# Patient Record
Sex: Male | Born: 1945 | Race: Black or African American | Hispanic: No | Marital: Married | Smoking: Former smoker
Health system: Southern US, Community
[De-identification: ages and names within clinical notes are randomized; demographics above are authoritative.]

## PROBLEM LIST (undated history)

## (undated) DIAGNOSIS — M1990 Unspecified osteoarthritis, unspecified site: Secondary | ICD-10-CM

## (undated) DIAGNOSIS — I10 Essential (primary) hypertension: Secondary | ICD-10-CM

## (undated) DIAGNOSIS — K5790 Diverticulosis of intestine, part unspecified, without perforation or abscess without bleeding: Secondary | ICD-10-CM

## (undated) DIAGNOSIS — K259 Gastric ulcer, unspecified as acute or chronic, without hemorrhage or perforation: Secondary | ICD-10-CM

## (undated) DIAGNOSIS — K635 Polyp of colon: Secondary | ICD-10-CM

## (undated) DIAGNOSIS — G4730 Sleep apnea, unspecified: Secondary | ICD-10-CM

## (undated) DIAGNOSIS — G4733 Obstructive sleep apnea (adult) (pediatric): Secondary | ICD-10-CM

## (undated) DIAGNOSIS — C801 Malignant (primary) neoplasm, unspecified: Secondary | ICD-10-CM

## (undated) DIAGNOSIS — K922 Gastrointestinal hemorrhage, unspecified: Secondary | ICD-10-CM

## (undated) DIAGNOSIS — C61 Malignant neoplasm of prostate: Secondary | ICD-10-CM

## (undated) DIAGNOSIS — I2510 Atherosclerotic heart disease of native coronary artery without angina pectoris: Secondary | ICD-10-CM

## (undated) DIAGNOSIS — E785 Hyperlipidemia, unspecified: Secondary | ICD-10-CM

## (undated) HISTORY — DX: Obstructive sleep apnea (adult) (pediatric): G47.33

## (undated) HISTORY — DX: Gastric ulcer, unspecified as acute or chronic, without hemorrhage or perforation: K25.9

## (undated) HISTORY — DX: Atherosclerotic heart disease of native coronary artery without angina pectoris: I25.10

## (undated) HISTORY — DX: Polyp of colon: K63.5

## (undated) HISTORY — DX: Diverticulosis of intestine, part unspecified, without perforation or abscess without bleeding: K57.90

## (undated) HISTORY — DX: Sleep apnea, unspecified: G47.30

## (undated) HISTORY — DX: Gastrointestinal hemorrhage, unspecified: K92.2

## (undated) HISTORY — DX: Essential (primary) hypertension: I10

## (undated) HISTORY — DX: Unspecified osteoarthritis, unspecified site: M19.90

---

## 2000-06-27 ENCOUNTER — Encounter: Payer: Self-pay | Admitting: Family Medicine

## 2000-06-27 ENCOUNTER — Ambulatory Visit (HOSPITAL_COMMUNITY): Admission: RE | Admit: 2000-06-27 | Discharge: 2000-06-27 | Payer: Self-pay | Admitting: Family Medicine

## 2001-07-31 ENCOUNTER — Ambulatory Visit (HOSPITAL_COMMUNITY): Admission: RE | Admit: 2001-07-31 | Discharge: 2001-07-31 | Payer: Self-pay | Admitting: Family Medicine

## 2001-08-25 ENCOUNTER — Ambulatory Visit (HOSPITAL_COMMUNITY): Admission: RE | Admit: 2001-08-25 | Discharge: 2001-08-25 | Payer: Self-pay | Admitting: Family Medicine

## 2001-09-08 ENCOUNTER — Encounter: Payer: Self-pay | Admitting: Family Medicine

## 2001-09-08 ENCOUNTER — Ambulatory Visit (HOSPITAL_COMMUNITY): Admission: RE | Admit: 2001-09-08 | Discharge: 2001-09-08 | Payer: Self-pay | Admitting: Family Medicine

## 2001-09-30 ENCOUNTER — Ambulatory Visit (HOSPITAL_COMMUNITY): Admission: RE | Admit: 2001-09-30 | Discharge: 2001-09-30 | Payer: Self-pay | Admitting: General Surgery

## 2001-09-30 ENCOUNTER — Encounter: Payer: Self-pay | Admitting: Gastroenterology

## 2002-10-18 ENCOUNTER — Encounter: Payer: Self-pay | Admitting: Family Medicine

## 2002-10-19 ENCOUNTER — Inpatient Hospital Stay (HOSPITAL_COMMUNITY): Admission: AD | Admit: 2002-10-19 | Discharge: 2002-10-21 | Payer: Self-pay | Admitting: Family Medicine

## 2004-03-24 ENCOUNTER — Ambulatory Visit (HOSPITAL_COMMUNITY): Admission: RE | Admit: 2004-03-24 | Discharge: 2004-03-24 | Payer: Self-pay | Admitting: Family Medicine

## 2004-04-12 ENCOUNTER — Encounter: Payer: Self-pay | Admitting: Orthopedic Surgery

## 2004-04-12 ENCOUNTER — Ambulatory Visit (HOSPITAL_COMMUNITY): Admission: RE | Admit: 2004-04-12 | Discharge: 2004-04-12 | Payer: Self-pay | Admitting: Family Medicine

## 2004-04-25 ENCOUNTER — Ambulatory Visit: Payer: Self-pay | Admitting: Orthopedic Surgery

## 2006-07-30 ENCOUNTER — Ambulatory Visit (HOSPITAL_COMMUNITY): Admission: RE | Admit: 2006-07-30 | Discharge: 2006-07-30 | Payer: Self-pay | Admitting: Family Medicine

## 2006-08-14 ENCOUNTER — Encounter (HOSPITAL_COMMUNITY): Admission: RE | Admit: 2006-08-14 | Discharge: 2006-09-13 | Payer: Self-pay | Admitting: Family Medicine

## 2006-11-13 ENCOUNTER — Ambulatory Visit (HOSPITAL_COMMUNITY): Admission: RE | Admit: 2006-11-13 | Discharge: 2006-11-13 | Payer: Self-pay | Admitting: Family Medicine

## 2007-04-28 ENCOUNTER — Ambulatory Visit (HOSPITAL_COMMUNITY): Admission: RE | Admit: 2007-04-28 | Discharge: 2007-04-28 | Payer: Self-pay | Admitting: *Deleted

## 2007-05-01 HISTORY — PX: CARDIAC CATHETERIZATION: SHX172

## 2007-05-13 HISTORY — PX: OTHER SURGICAL HISTORY: SHX169

## 2007-05-28 ENCOUNTER — Observation Stay (HOSPITAL_COMMUNITY): Admission: RE | Admit: 2007-05-28 | Discharge: 2007-05-29 | Payer: Self-pay | Admitting: Cardiology

## 2007-05-28 HISTORY — PX: CARDIAC CATHETERIZATION: SHX172

## 2007-06-03 HISTORY — PX: OTHER SURGICAL HISTORY: SHX169

## 2007-07-01 HISTORY — PX: OTHER SURGICAL HISTORY: SHX169

## 2007-07-07 ENCOUNTER — Encounter (HOSPITAL_COMMUNITY): Admission: RE | Admit: 2007-07-07 | Discharge: 2007-08-06 | Payer: Self-pay | Admitting: *Deleted

## 2007-08-07 ENCOUNTER — Encounter (HOSPITAL_COMMUNITY): Admission: RE | Admit: 2007-08-07 | Discharge: 2007-09-06 | Payer: Self-pay | Admitting: *Deleted

## 2007-09-07 ENCOUNTER — Encounter (HOSPITAL_COMMUNITY): Admission: RE | Admit: 2007-09-07 | Discharge: 2007-10-07 | Payer: Self-pay | Admitting: *Deleted

## 2008-02-26 DIAGNOSIS — K259 Gastric ulcer, unspecified as acute or chronic, without hemorrhage or perforation: Secondary | ICD-10-CM

## 2008-02-26 DIAGNOSIS — K922 Gastrointestinal hemorrhage, unspecified: Secondary | ICD-10-CM

## 2008-02-26 HISTORY — DX: Gastrointestinal hemorrhage, unspecified: K92.2

## 2008-02-26 HISTORY — DX: Gastric ulcer, unspecified as acute or chronic, without hemorrhage or perforation: K25.9

## 2008-02-26 HISTORY — PX: OTHER SURGICAL HISTORY: SHX169

## 2008-12-26 HISTORY — PX: NASAL ENDOSCOPY: SHX286

## 2009-01-12 ENCOUNTER — Encounter: Admission: RE | Admit: 2009-01-12 | Discharge: 2009-01-12 | Payer: Self-pay | Admitting: Orthopaedic Surgery

## 2009-01-23 ENCOUNTER — Other Ambulatory Visit: Payer: Self-pay | Admitting: Emergency Medicine

## 2009-01-23 ENCOUNTER — Inpatient Hospital Stay (HOSPITAL_COMMUNITY): Admission: EM | Admit: 2009-01-23 | Discharge: 2009-01-26 | Payer: Self-pay | Admitting: Pulmonary Disease

## 2009-01-23 ENCOUNTER — Ambulatory Visit: Payer: Self-pay | Admitting: Internal Medicine

## 2009-01-24 ENCOUNTER — Ambulatory Visit: Payer: Self-pay | Admitting: Gastroenterology

## 2009-01-24 ENCOUNTER — Encounter (INDEPENDENT_AMBULATORY_CARE_PROVIDER_SITE_OTHER): Payer: Self-pay | Admitting: Internal Medicine

## 2009-01-25 ENCOUNTER — Encounter (INDEPENDENT_AMBULATORY_CARE_PROVIDER_SITE_OTHER): Payer: Self-pay | Admitting: *Deleted

## 2009-01-30 ENCOUNTER — Encounter: Admission: RE | Admit: 2009-01-30 | Discharge: 2009-01-30 | Payer: Self-pay | Admitting: Orthopaedic Surgery

## 2009-02-01 ENCOUNTER — Encounter: Payer: Self-pay | Admitting: Gastroenterology

## 2009-02-08 ENCOUNTER — Encounter (HOSPITAL_COMMUNITY): Admission: RE | Admit: 2009-02-08 | Discharge: 2009-02-24 | Payer: Self-pay | Admitting: Orthopaedic Surgery

## 2009-02-27 ENCOUNTER — Encounter (HOSPITAL_COMMUNITY): Admission: RE | Admit: 2009-02-27 | Discharge: 2009-03-29 | Payer: Self-pay | Admitting: Orthopaedic Surgery

## 2009-03-28 ENCOUNTER — Ambulatory Visit: Payer: Self-pay | Admitting: Gastroenterology

## 2009-03-28 DIAGNOSIS — K253 Acute gastric ulcer without hemorrhage or perforation: Secondary | ICD-10-CM

## 2009-03-29 LAB — CONVERTED CEMR LAB
Basophils Absolute: 0.1 10*3/uL (ref 0.0–0.1)
Eosinophils Absolute: 0.4 10*3/uL (ref 0.0–0.7)
HCT: 40.9 % (ref 39.0–52.0)
Hemoglobin: 13.3 g/dL (ref 13.0–17.0)
Lymphs Abs: 2.2 10*3/uL (ref 0.7–4.0)
MCHC: 32.5 g/dL (ref 30.0–36.0)
MCV: 90.6 fL (ref 78.0–100.0)
Monocytes Absolute: 1.1 10*3/uL — ABNORMAL HIGH (ref 0.1–1.0)
Monocytes Relative: 10.2 % (ref 3.0–12.0)
Neutro Abs: 6.5 10*3/uL (ref 1.4–7.7)
Platelets: 234 10*3/uL (ref 150.0–400.0)
RDW: 12.8 % (ref 11.5–14.6)

## 2009-03-30 ENCOUNTER — Encounter (HOSPITAL_COMMUNITY): Admission: RE | Admit: 2009-03-30 | Discharge: 2009-04-29 | Payer: Self-pay | Admitting: Orthopaedic Surgery

## 2009-03-31 ENCOUNTER — Ambulatory Visit: Payer: Self-pay | Admitting: Gastroenterology

## 2009-04-26 ENCOUNTER — Encounter: Payer: Self-pay | Admitting: Gastroenterology

## 2009-04-26 ENCOUNTER — Telehealth (INDEPENDENT_AMBULATORY_CARE_PROVIDER_SITE_OTHER): Payer: Self-pay | Admitting: *Deleted

## 2009-04-27 ENCOUNTER — Telehealth: Payer: Self-pay | Admitting: Gastroenterology

## 2009-10-27 ENCOUNTER — Ambulatory Visit: Payer: Self-pay | Admitting: Gastroenterology

## 2009-12-04 ENCOUNTER — Encounter: Payer: Self-pay | Admitting: Gastroenterology

## 2009-12-08 ENCOUNTER — Encounter (INDEPENDENT_AMBULATORY_CARE_PROVIDER_SITE_OTHER): Payer: Self-pay | Admitting: *Deleted

## 2009-12-08 ENCOUNTER — Ambulatory Visit: Payer: Self-pay | Admitting: Gastroenterology

## 2009-12-08 DIAGNOSIS — K625 Hemorrhage of anus and rectum: Secondary | ICD-10-CM

## 2010-01-23 ENCOUNTER — Ambulatory Visit: Payer: Self-pay | Admitting: Gastroenterology

## 2010-01-23 DIAGNOSIS — K635 Polyp of colon: Secondary | ICD-10-CM

## 2010-01-23 HISTORY — DX: Polyp of colon: K63.5

## 2010-01-26 ENCOUNTER — Encounter: Payer: Self-pay | Admitting: Gastroenterology

## 2010-02-25 HISTORY — PX: OTHER SURGICAL HISTORY: SHX169

## 2010-03-13 HISTORY — PX: TRANSTHORACIC ECHOCARDIOGRAM: SHX275

## 2010-03-22 ENCOUNTER — Telehealth: Payer: Self-pay | Admitting: Gastroenterology

## 2010-03-27 NOTE — Letter (Signed)
Summary: Diabetic Instructions  [facility]  [address]   , [place] [address]   Phone: [phone]  Fax: [phone]    Jordan [name] 11/07/1945 MRN: [id number]   X   ORAL DIABETIC MEDICATION INSTRUCTIONS  The day before your procedure:   Take your diabetic pill as you do normally  The day of your procedure:   Do not take your diabetic pill    We will check your blood sugar levels during the admission process and again in Recovery before discharging you home  ________________________________________________________________________  

## 2010-03-27 NOTE — Medication Information (Signed)
Summary: Approved Pantoprazole / [facility]  Approved Pantoprazole / [facility]   Imported By: Lennie Odor 05/04/2009 11:56:35  _____________________________________________________________________  External Attachment:    Type:   Image     Comment:   External Document

## 2010-03-27 NOTE — Procedures (Signed)
Summary: Colonoscopy  Patient: [name] Note: All result statuses are Final unless otherwise noted.  Tests: (1) Colonoscopy (COL)   COL Colonoscopy           DONE     [facility]     [address]     [place], [place]  [address]           COLONOSCOPY PROCEDURE REPORT     PATIENT:  Jordan [name], Jordan [name]  MR#:  [id number]     BIRTHDATE:  1945-08-16, 64 yrs. old  GENDER:  male     ENDOSCOPIST:  Rachael Fee, MD     REF. BY:  [name], M.D.     PROCEDURE DATE:  01/23/2010     PROCEDURE:  Colonoscopy with snare polypectomy     ASA CLASS:  Class II     INDICATIONS:  minor rectal bleeding     MEDICATIONS:   Fentanyl 25 mcg IV, Versed 5 mg IV     DESCRIPTION OF PROCEDURE:   After the risks benefits and     alternatives of the procedure were thoroughly explained, informed     consent was obtained.  Digital rectal exam was performed and     revealed no rectal masses.   The LB [id number]-[id number] endoscope     was introduced through the anus and advanced to the cecum, which     was identified by both the appendix and ileocecal valve, without     limitations.  The quality of the prep was good, using MoviPrep.     The instrument was then slowly withdrawn as the colon was fully     examined.     <<PROCEDUREIMAGES>>     FINDINGS:  A diminutive polyp was found in the descending colon.     This was 3mm across, removed with cold snare and sent to pathology     (jar 1) (see image4).  Mild diverticulosis was found in the     sigmoid to descending colon segments (see image1).  Internal     hemorrhoids were found. These were small, not thrombosed.  This     was otherwise a normal examination of the colon (see image2,     image3, and image5).   Retroflexed views in the rectum revealed no     abnormalities.    The scope was then withdrawn from the patient     and the procedure completed.     COMPLICATIONS:  None     ENDOSCOPIC IMPRESSION:     1) Diminutive polyp in the descending colon,  removed and sent to     pathology     2) Mild diverticulosis in the sigmoid to descending colon     segments     3) Small internal hemorrhoids     4) Otherwise normal examination           RECOMMENDATIONS:     1) If the polyp(s) removed today are proven to be adenomatous     (pre-cancerous) polyps, you will need a repeat colonoscopy in 5     years. Otherwise you should continue to follow colorectal cancer     screening guidelines for "routine risk" patients with colonoscopy     in 10 years.     2) You will receive a letter within 1-2 weeks with the results     of your biopsy as well as final recommendations. Please call my     office if you have not received  a letter after 3 weeks.           ______________________________     Rachael Fee, MD           n.     eSIGNED:   Rachael Fee at 01/23/2010 10:58 AM           Bertis Ruddy, [id number]  Note: An exclamation mark (!) indicates a result that was not dispersed into the flowsheet. Document Creation Date: 01/23/2010 10:58 AM _______________________________________________________________________  (1) Order result status: Final Collection or observation date-time: 01/23/2010 10:50 Requested date-time:  Receipt date-time:  Reported date-time:  Referring Physician:   Ordering Physician: Rob Bunting [phone]) Specimen Source:  Source: Launa Grill Order Number: [id number] Lab site:   Appended Document: Colonoscopy     Procedures Next Due Date:    Colonoscopy: 12/2019

## 2010-03-27 NOTE — Letter (Signed)
Summary: Anticoagulation Modification Letter  [facility]  [address] [place], [place] [address]   Phone: [phone]  Fax: [phone]    December 08, 2009  Re:    ADELBERT GASPARD DOB:    23-Apr-1945 MRN:    [id number]    Dear Dr Daphene Jaeger,  We have scheduled the above patient for an endoscopic procedure. Our records show that  he/she is on anticoagulation therapy. Please advise as to how long the patient may come off their therapy of plavix prior to the scheduled procedure.   Please fax back/or route the completed form to Patty at [phone].  Thank you for your help with this matter.  Sincerely,  Chales Abrahams CMA Duncan Dull)   Physician Recommendation:  Hold Plavix 7 days prior ________________  Hold Coumadin 5 days prior ____________  Other ______________________________     Appended Document: Anticoagulation Modification Letter fax number in the system for Dr Tresa Endo is out of order will fax to [phone]  Appended Document: Anticoagulation Modification Letter response recieved ok to hold pt notified letter to be scanned into EMR

## 2010-03-27 NOTE — Procedures (Signed)
Summary: Colonoscopy/[facility]  Colonoscopy/[facility]   Imported By: Sherian Rein 12/20/2009 09:24:36  _____________________________________________________________________  External Attachment:    Type:   Image     Comment:   External Document

## 2010-03-27 NOTE — Procedures (Signed)
Summary: Upper Endoscopy  Patient: [name] Note: All result statuses are Final unless otherwise noted.  Tests: (1) Upper Endoscopy (EGD)   EGD Upper Endoscopy       DONE     [facility]     [address]     [place], [place]  [address]           ENDOSCOPY PROCEDURE REPORT           PATIENT:  Jordan [name], Jordan [name]  MR#:  [id number]     BIRTHDATE:  06-02-1945, 63 yrs. old  GENDER:  male     ENDOSCOPIST:  Rachael Fee, MD     PROCEDURE DATE:  03/31/2009     PROCEDURE:  EGD, diagnostic     ASA CLASS:  Class II     INDICATIONS:  gastric ulcer 11/10 (probably NSAID related, was H.     pylori negative)     MEDICATIONS:   Fentanyl 50 mcg IV, Versed 5 mg IV     TOPICAL ANESTHETIC:  Exactacain Spray           DESCRIPTION OF PROCEDURE:   After the risks benefits and     alternatives of the procedure were thoroughly explained, informed     consent was obtained.  The LB GIF-H180 [id number] endoscope was     introduced through the mouth and advanced to the second portion of     the duodenum, without limitations.  The instrument was slowly     withdrawn as the mucosa was fully examined.     <<PROCEDUREIMAGES>>           The upper, middle, and distal third of the esophagus were     carefully inspected and no abnormalities were noted. The z-line     was well seen at the GEJ. The endoscope was pushed into the fundus     which was normal including a retroflexed view. The antrum,gastric     body, first and second part of the duodenum were unremarkable (see     image1, image3, image4, image5, image6, and image7).     Retroflexed views revealed no abnormalities.    The scope was then     withdrawn from the patient and the procedure completed.           COMPLICATIONS:  None           ENDOSCOPIC IMPRESSION:     1) Normal EGD; the previously noted gastric ulcer has healed           RECOMMENDATIONS:     Follow up with Dr. Christella Hartigan as needed.     Should continue to avoid NSAIDs however it is OK  to take aspirin     for cardiac reasons as long as you stay on once daily PPI such as     protonix.           ______________________________     Rachael Fee, MD           cc: [name], MD           n.     eSIGNED:   Rachael Fee at 03/31/2009 08:40 AM           Bertis Ruddy, [id number]  Note: An exclamation mark (!) indicates a result that was not dispersed into the flowsheet. Document Creation Date: 03/31/2009 8:40 AM _______________________________________________________________________  (1) Order result status: Final Collection or observation date-time: 03/31/2009 08:34 Requested date-time:  Receipt date-time:  Reported date-time:  Referring Physician:   Ordering Physician: Rob Bunting [phone]) Specimen Source:  Source: Launa Grill Order Number: [id number] Lab site:

## 2010-03-27 NOTE — Progress Notes (Signed)
Summary: prior auth   Phone Note From Pharmacy   Summary of Call: prior auth needed for pantoprazole.  form faxed to [facility]. Initial call taken by: Chales Abrahams CMA (AAMA),  April 26, 2009 9:30 AM

## 2010-03-27 NOTE — Letter (Signed)
Summary: Hold on Medication For Procedure / [facility]  Hold on Medication For Procedure / [facility]   Imported By: Lennie Odor 12/20/2009 11:01:33  _____________________________________________________________________  External Attachment:    Type:   Image     Comment:   External Document

## 2010-03-27 NOTE — Assessment & Plan Note (Signed)
Review of gastrointestinal problems: 1. Gastric ulcer, November 2010. EGD. Likely NSAID related.  H. pylori biopsies negative. Repeat EGD 3 months later was normal    History of Present Illness Visit Type: Follow-up Visit Primary GI MD: Rob Bunting MD Primary Provider: [name] MD Chief Complaint: Consult Colonoscopy due to Plavix History of Present Illness:     very pleasant 65 year old man whom I last saw about 8 months ago at the time of the EGD, see those results summarized above.  who had a colonoscopy about 8 years ago, his PCP sent him for a colonoscopy now. The colonoscopy 8 years ago was normal, no polyps.  Was told that 10 years was next time for colonoscopy.    He has had no bowel troubles.  He will see a small amount of red blood on TP occasionally.  He is on plavix, since stent placed 05/2007.  Dr. Daphene Jaeger is his cardiologist.             Current Medications (verified): 1)  Glucosamine-Chondroitin  Caps (Glucosamine-Chondroit-Vit C-Mn) .Marland Kitchen.. 1 By Mouth Once Daily 2)  Fish Oil 1000 Mg Caps (Omega-3 Fatty Acids) .Marland Kitchen.. 1 By Mouth Two Times A Day 3)  Garlic Oil 1000 Mg Caps (Garlic) .Marland Kitchen.. 1 By Mouth Once Daily 4)  Lisinopril 5 Mg Tabs (Lisinopril) .Marland Kitchen.. 1 By Mouth Once Daily 5)  Metformin Hcl 500 Mg Xr24h-Tab (Metformin Hcl) .Marland Kitchen.. 1 Po    Once Daily 6)  Plavix 75 Mg Tabs (Clopidogrel Bisulfate) .Marland Kitchen.. 1 By Mouth Once Daily 7)  Simvastatin 40 Mg Tabs (Simvastatin) .Marland Kitchen.. 1 By Mouth Once Daily 8)  Cinnamon 500 Mg Caps (Cinnamon) .... Two Times A Day 9)  Carvedilol 3.125 Mg Tabs (Carvedilol) .... Two Times A Day 10)  Beta Prostate Supplement .... Two Times A Day 11)  Aspir-Low 81 Mg Tbec (Aspirin) .... Once Daily 12)  Pantoprazole Sodium 40 Mg Tbec (Pantoprazole Sodium) .... Once Daily 13)  Ferrous Sulfate 325 (65 Fe) Mg Tabs (Ferrous Sulfate) .... Once Daily 14)  Tylenol 8 Hour 650 Mg Cr-Tabs (Acetaminophen) .... As Needed 15)  Oral Chelation .... As  Needed  Allergies (verified): 1)  ! Codeine  Vital Signs:  Patient profile:   65 year old male Height:      67 inches Weight:      298.0 pounds BMI:     46.84 Pulse rate:   52 / minute Pulse rhythm:   regular BP sitting:   110 / 60  (left arm) Cuff size:   regular  Vitals Entered By: Harlow Mares CMA Duncan Dull) (December 08, 2009 2:12 PM)  Physical Exam  Additional Exam:  Constitutional: Morbidly obese, otherwise generally well appearing Psychiatric: alert and oriented times 3 Abdomen: soft, non-tender, non-distended, normal bowel sounds    Impression & Recommendations:  Problem # 1:  intermittent rectal bleeding, this sounds hemorrhoidal, just a small bit of blood on the tissue paper periodically. He had a colonoscopy 8 years ago and we will get those results faxed here for review. Given his intermittent bleeding I think it is reasonable that we repeat colonoscopy at his soonest convenience. He is on Plavix for a stent placed in 2009. He is not sure if this is a drug-eluting stent. We will contact his cardiologist and see if it is okay that he called the Plavix for 5 days prior to a colonoscopy. Being on Plavix puts him at increased risk for bleeding complications.  Patient Instructions: 1)  You will be scheduled  to have a colonoscopy. 2)  We will contact Dr. Daphene Jaeger (cardiology) about you holding the plavix prior to the procedure. 3)  We will get records from [facility] colonoscopy from 2003. 4)  The medication list was reviewed and reconciled.  All changed / newly prescribed medications were explained.  A complete medication list was provided to the patient / caregiver.  Appended Document: Orders Update/Movi    Clinical Lists Changes  Problems: Added new problem of HEMORRHAGE OF RECTUM AND ANUS (ICD-569.3) Medications: Added new medication of MOVIPREP 100 GM  SOLR (PEG-KCL-NACL-NASULF-NA ASC-C) As per prep instructions. - Signed Rx of MOVIPREP 100 GM  SOLR  (PEG-KCL-NACL-NASULF-NA ASC-C) As per prep instructions.;  #1 x 0;  Signed;  Entered by: Chales Abrahams CMA (AAMA);  Authorized by: Rachael Fee MD;  Method used: Electronically to [facility]*, [address] [place], [place], [place]  [address], Ph: [phone], Fax: [phone] Orders: Added new Test order of Colonoscopy (Colon) - Signed    Prescriptions: MOVIPREP 100 GM  SOLR (PEG-KCL-NACL-NASULF-NA ASC-C) As per prep instructions.  #1 x 0   Entered by:   Chales Abrahams CMA (AAMA)   Authorized by:   Rachael Fee MD   Signed by:   Chales Abrahams CMA (AAMA) on 12/08/2009   Method used:   Electronically to        [facility]* (retail)       [address]       [place], [place]  [address]       Ph: [phone]       Fax: [phone]   RxID:   [id number]

## 2010-03-27 NOTE — Letter (Signed)
Summary: Results Letter  [facility]  [address] [place], [place] [address]   Phone: [phone]  Fax: [phone]        January 26, 2010 MRN: [id number]    Jordan [name] [address] [place], [place]  [address]    Dear Mr. GORUM,   Good news.  The polyp(s) that were removed during your recent procedure were NOT pre-cancerous.  You should continue to follow current colorectal cancer screening guidelines with a repeat colonoscopy in 10 years.  We will therefore put your information in our reminder system and will contact you in 10 years to schedule a repeat procedure.  Please call if you have any questions or concerns.       Sincerely,  Rachael Fee MD  This letter has been electronically signed by your physician.  Appended Document: Results Letter Letter mailed

## 2010-03-27 NOTE — Letter (Signed)
Summary: [facility] Instructions  [facility]  [address] [place], [place] [address]   Phone: [phone]  Fax: [phone]       Jordan [name]    [birth year]    MRN: [id number]        Procedure Day /Date:01/23/10 TUE     Arrival Time:9 am     Procedure Time:10 am     Location of Procedure:                    X  [facility] (4th Floor)                        PREPARATION FOR COLONOSCOPY WITH MOVIPREP   Starting 5 days prior to your procedure 01/18/10 do not eat nuts, seeds, popcorn, corn, beans, peas,  salads, or any raw vegetables.  Do not take any fiber supplements (e.g. Metamucil, Citrucel, and Benefiber).  THE DAY BEFORE YOUR PROCEDURE         DATE: 01/22/10  DAY: MON  1.  Drink clear liquids the entire day-NO SOLID FOOD  2.  Do not drink anything colored red or purple.  Avoid juices with pulp.  No orange juice.  3.  Drink at least 64 oz. (8 glasses) of fluid/clear liquids during the day to prevent dehydration and help the prep work efficiently.  CLEAR LIQUIDS INCLUDE: Water Jello Ice Popsicles Tea (sugar ok, no milk/cream) Powdered fruit flavored drinks Coffee (sugar ok, no milk/cream) Gatorade Juice: apple, white grape, white cranberry  Lemonade Clear bullion, consomm, broth Carbonated beverages (any kind) Strained chicken noodle soup Hard Candy                             4.  In the morning, mix first dose of MoviPrep solution:    Empty 1 Pouch A and 1 Pouch B into the disposable container    Add lukewarm drinking water to the top line of the container. Mix to dissolve    Refrigerate (mixed solution should be used within 24 hrs)  5.  Begin drinking the prep at 5:00 p.m. The MoviPrep container is divided by 4 marks.   Every 15 minutes drink the solution down to the next mark (approximately 8 oz) until the full liter is complete.   6.  Follow completed prep with 16 oz of clear liquid of your choice (Nothing red or purple).   Continue to drink clear liquids until bedtime.  7.  Before going to bed, mix second dose of MoviPrep solution:    Empty 1 Pouch A and 1 Pouch B into the disposable container    Add lukewarm drinking water to the top line of the container. Mix to dissolve    Refrigerate  THE DAY OF YOUR PROCEDURE      DATE: 01/23/10 DAY: TUE  Beginning at 5 a.m. (5 hours before procedure):         1. Every 15 minutes, drink the solution down to the next mark (approx 8 oz) until the full liter is complete.  2. Follow completed prep with 16 oz. of clear liquid of your choice.    3. You may drink clear liquids until 8 am (2 HOURS BEFORE PROCEDURE).   MEDICATION INSTRUCTIONS  Unless otherwise instructed, you should take regular prescription medications with a small sip of water   as early as possible the morning of your procedure.  Diabetic  patients - see separate instructions.  Stop taking Plavix  01/16/10  (7 days before procedure).     You will be contacted by our office prior to your procedure for directions on holding your Plavix.  If you do not hear from our office 1 week prior to your scheduled procedure, please call [phone] to discuss.            OTHER INSTRUCTIONS  You will need a responsible adult at least 67 years of age to accompany you and drive you home.   This person must remain in the waiting room during your procedure.  Wear loose fitting clothing that is easily removed.  Leave jewelry and other valuables at home.  However, you may wish to bring a book to read or  an iPod/MP3 player to listen to music as you wait for your procedure to start.  Remove all body piercing jewelry and leave at home.  Total time from sign-in until discharge is approximately 2-3 hours.  You should go home directly after your procedure and rest.  You can resume normal activities the  day after your procedure.  The day of your procedure you should not:   Drive   Make legal  decisions   Operate machinery   Drink alcohol   Return to work  You will receive specific instructions about eating, activities and medications before you leave.    The above instructions have been reviewed and explained to me by   _______________________    I fully understand and can verbalize these instructions _____________________________ Date _________

## 2010-03-27 NOTE — Letter (Signed)
Summary: EGD Instructions  [facility]  [address] [place], [place] [address]   Phone: [phone]  Fax: [phone]       Jordan [name]    July 29, 1945    MRN: [id number]       Procedure Day /Date:03/31/09     Arrival Time: 730 am     Procedure Time:830 am     Location of Procedure:                    X  [facility]oscopy Center (4th Floor)    PREPARATION FOR ENDOSCOPY   On2/4/11THE DAY OF THE PROCEDURE:  1.   No solid foods, milk or milk products are allowed after midnight the night before your procedure.  2.   Do not drink anything colored red or purple.  Avoid juices with pulp.  No orange juice.  3.  You may drink clear liquids until630 am, which is 2 hours before your procedure.                                                                                                CLEAR LIQUIDS INCLUDE: Water Jello Ice Popsicles Tea (sugar ok, no milk/cream) Powdered fruit flavored drinks Coffee (sugar ok, no milk/cream) Gatorade Juice: apple, white grape, white cranberry  Lemonade Clear bullion, consomm, broth Carbonated beverages (any kind) Strained chicken noodle soup Hard Candy   MEDICATION INSTRUCTIONS  Unless otherwise instructed, you should take regular prescription medications with a small sip of water as early as possible the morning of your procedure.  Diabetic patients - see separate instructions.  Stay on Plavix  per Dr Christella Hartigan               OTHER INSTRUCTIONS  You will need a responsible adult at least 67 years of age to accompany you and drive you home.   This person must remain in the waiting room during your procedure.  Wear loose fitting clothing that is easily removed.  Leave jewelry and other valuables at home.  However, you may wish to bring a book to read or an iPod/MP3 player to listen to music as you wait for your procedure to start.  Remove all body piercing jewelry and leave at home.  Total time from sign-in until  discharge is approximately 2-3 hours.  You should go home directly after your procedure and rest.  You can resume normal activities the day after your procedure.  The day of your procedure you should not:   Drive   Make legal decisions   Operate machinery   Drink alcohol   Return to work  You will receive specific instructions about eating, activities and medications before you leave.    The above instructions have been reviewed and explained to me by   _______________________    I fully understand and can verbalize these instructions _____________________________ Date _________

## 2010-03-27 NOTE — Assessment & Plan Note (Signed)
Review of gastrointestinal problems: 1. Acute upper GI bleeding;  November 2010; Hb 7.1; Clean based prepyloric ulcer noted on EGD 12/2008.  Likely NSAID related but biopsies were taken to check for H. pylori. Schatzki's ring, not dilated.  Path showed no H. pylori.   History of Present Illness Visit Type: Follow-up Visit Primary GI MD: Rob Bunting MD Primary Provider: [name] MD Chief Complaint: post hospital f/u History of Present Illness:     very pleasant 65 year old man who is here with his wife today. I last saw him at the time of his hospitalization for acute upper GI bleed in November.  since d/c he has been taking iron daily, metamucil and stool softner. Has completed about 2 months of PPI once daily.  the ringing in his years and fatigue have completely resolved. He takes an aspirin a day for cardioprotective reasons.             Current Medications (verified): 1)  Glucosamine-Chondroitin  Caps (Glucosamine-Chondroit-Vit C-Mn) .Marland Kitchen.. 1 By Mouth Once Daily 2)  Fish Oil 1000 Mg Caps (Omega-3 Fatty Acids) .Marland Kitchen.. 1 By Mouth Two Times A Day 3)  Garlic Oil 1000 Mg Caps (Garlic) .Marland Kitchen.. 1 By Mouth Once Daily 4)  Terazosin Hcl 5 Mg Caps (Terazosin Hcl) .Marland Kitchen.. 1 By Mouth At Bedtime 5)  Hydrocodone-Acetaminophen 10-500 Mg Tabs (Hydrocodone-Acetaminophen) .... Every 4 Hours As Needed 6)  Lisinopril 5 Mg Tabs (Lisinopril) .Marland Kitchen.. 1 By Mouth Once Daily 7)  Metformin Hcl 500 Mg Xr24h-Tab (Metformin Hcl) .Marland Kitchen.. 1 Po    Once Daily 8)  Plavix 75 Mg Tabs (Clopidogrel Bisulfate) .Marland Kitchen.. 1 By Mouth Once Daily 9)  Simvastatin 40 Mg Tabs (Simvastatin) .Marland Kitchen.. 1 By Mouth Once Daily 10)  Cinnamon 500 Mg Caps (Cinnamon) .... Two Times A Day 11)  Carvedilol 3.125 Mg Tabs (Carvedilol) .... Two Times A Day 12)  Beta Prostate Supplement .... Two Times A Day 13)  Aspir-Low 81 Mg Tbec (Aspirin) .... Once Daily 14)  Senna-Docusate Sodium 8.6-50 Mg Tabs (Sennosides-Docusate Sodium) .... Once Daily 15)   Pantoprazole Sodium 40 Mg Tbec (Pantoprazole Sodium) .... Once Daily 16)  Ferrous Sulfate 325 (65 Fe) Mg Tabs (Ferrous Sulfate) .... Once Daily 17)  Tylenol 325 Mg Tabs (Acetaminophen) .... As Needed 18)  Oral Chelation .... As Needed  Allergies (verified): 1)  ! Codeine  Vital Signs:  Patient profile:   65 year old male Height:      67 inches Weight:      289.13 pounds BMI:     45.45 Pulse rate:   64 / minute Pulse rhythm:   regular BP sitting:   112 / 50  (left arm) Cuff size:   regular  Vitals Entered By: June McMurray CMA Duncan Dull) (March 28, 2009 2:52 PM)  Physical Exam  Additional Exam:  Constitutional: obese otherwise generally well appearing Psychiatric: alert and oriented times 3 Abdomen: soft, non-tender, non-distended, normal bowel sounds    Impression & Recommendations:  Problem # 1:  gastric ulcer he has had no signs of recurrent upper GI bleeding. He'll get a repeat CBC today to see if his blood counts have normalized on iron supplements. If they have I will likely stop his iron. He is on an aspirin a day for cardioprotective reasons and for that reason I think he should stay on proton pump inhibitor once daily indefinitely to protect him from having recurrent ulcer. We will set him up for a repeat EGD at his soonest convenience. It is important  to rule out gastric cancer which I suspect is not the case here.  Other Orders: TLB-CBC Platelet - w/Differential ([id number]-[id number])  Patient Instructions: 1)  Prescription for protonix written, take one pill a day as long as you need to take aspirin daily. 2)  You will be scheduled to have an upper endoscopy. 3)  You will get lab test(s) done today (cbc). 4)  A copy of this information will be sent to Dr. Loleta Chance. 5)  The medication list was reviewed and reconciled.  All changed / newly prescribed medications were explained.  A complete medication list was provided to the patient / caregiver. Prescriptions: PANTOPRAZOLE  SODIUM 40 MG TBEC (PANTOPRAZOLE SODIUM) once daily  #30 x 11   Entered and Authorized by:   Rachael Fee MD   Signed by:   Rachael Fee MD on 03/28/2009   Method used:   Electronically to        [facility] (retail)       [address]       [place], [place]  [address]       Ph: [phone]       Fax: [phone]   RxID:   [id number]   Appended Document: Orders Update/EGD    Clinical Lists Changes  Orders: Added new Test order of EGD (EGD) - Signed

## 2010-03-27 NOTE — Miscellaneous (Signed)
Summary: DIRECT COLON SCREEN-AGE/YF  Clinical Lists Changes      Patient is on Plavix for heart, this was not adressed by md, cancelled procedure and made office appointment for pt to see Dr.Ravindra Baranek per policy. explained this to patient at this time.Sherren Kerns RN  October 27, 2009 9:16 AM

## 2010-03-27 NOTE — Letter (Signed)
Summary: Diabetic Instructions  [facility]  [address] [place], [place] [address]   Phone: [phone]  Fax: [phone]    Jordan [name] 1946-02-24 MRN: [id number]   X   ORAL DIABETIC MEDICATION INSTRUCTIONS  The day before your procedure:   Take your diabetic pill as you do normally  The day of your procedure:   Do not take your diabetic pill    We will check your blood sugar levels during the admission process and again in Recovery before discharging you home  ________________________________________________________________________

## 2010-03-27 NOTE — Progress Notes (Signed)
Summary: Protonix  Phone Note Call from Patient Call back at Home Phone [phone]   Caller: Patient Call For: Dr. Christella Hartigan Reason for Call: Refill Medication Summary of Call: pt states his pharamcy told him that they could not refill Protonix until Dr. Christella Hartigan authorized it... [place], [phone] Initial call taken by: Vallarie Mare,  April 27, 2009 9:16 AM  Follow-up for Phone Call        pt aware that med has been approved, Follow-up by: Chales Abrahams CMA Duncan Dull),  April 27, 2009 9:31 AM

## 2010-03-29 NOTE — Progress Notes (Signed)
Summary: Medication ?s  Phone Note Call from Patient Call back at Home Phone [phone]   Caller: Patient Call For: Dr. Christella Hartigan Reason for Call: Talk to Nurse Summary of Call: Pt wants to speak with Patty about a medication that he is on Initial call taken by: [place] Johnson,  March 22, 2010 11:40 AM  Follow-up for Phone Call        pt is on pantoprazole 40 mg had ulcer last year wants to know does he need to continue?  Per Dr Christella Hartigan recommendations on his EGD he needs to continue PPI as long as he takes asa.  Pt is taking asa and will remain on PPI.  refill sent Follow-up by: Chales Abrahams CMA Duncan Dull),  March 22, 2010 1:54 PM    Prescriptions: PANTOPRAZOLE SODIUM 40 MG TBEC (PANTOPRAZOLE SODIUM) once daily  #90 x 3   Entered by:   Chales Abrahams CMA (AAMA)   Authorized by:   Rachael Fee MD   Signed by:   Chales Abrahams CMA (AAMA) on 03/22/2010   Method used:   Electronically to        [facility] (retail)       [address]       [place], [place]  [address]       Ph: [phone]       Fax: [phone]   RxID:   [id number]

## 2010-05-08 LAB — GLUCOSE, CAPILLARY: Glucose-Capillary: 108 mg/dL — ABNORMAL HIGH (ref 70–99)

## 2010-05-29 LAB — BASIC METABOLIC PANEL
BUN: 16 mg/dL (ref 6–23)
CO2: 25 mEq/L (ref 19–32)
Chloride: 108 mEq/L (ref 96–112)
Creatinine, Ser: 0.73 mg/dL (ref 0.4–1.5)
Glucose, Bld: 95 mg/dL (ref 70–99)
Potassium: 3.5 mEq/L (ref 3.5–5.1)

## 2010-05-29 LAB — CBC
HCT: 24.8 % — ABNORMAL LOW (ref 39.0–52.0)
HCT: 25.3 % — ABNORMAL LOW (ref 39.0–52.0)
MCHC: 34.2 g/dL (ref 30.0–36.0)
MCV: 90.8 fL (ref 78.0–100.0)
MCV: 91.6 fL (ref 78.0–100.0)
Platelets: 184 10*3/uL (ref 150–400)
Platelets: 190 10*3/uL (ref 150–400)
RBC: 2.71 MIL/uL — ABNORMAL LOW (ref 4.22–5.81)
WBC: 10.8 10*3/uL — ABNORMAL HIGH (ref 4.0–10.5)
WBC: 11.8 10*3/uL — ABNORMAL HIGH (ref 4.0–10.5)

## 2010-05-29 LAB — GLUCOSE, CAPILLARY
Glucose-Capillary: 105 mg/dL — ABNORMAL HIGH (ref 70–99)
Glucose-Capillary: 106 mg/dL — ABNORMAL HIGH (ref 70–99)
Glucose-Capillary: 111 mg/dL — ABNORMAL HIGH (ref 70–99)
Glucose-Capillary: 126 mg/dL — ABNORMAL HIGH (ref 70–99)
Glucose-Capillary: 97 mg/dL (ref 70–99)
Glucose-Capillary: 98 mg/dL (ref 70–99)

## 2010-05-30 LAB — CBC
HCT: 27.4 % — ABNORMAL LOW (ref 39.0–52.0)
HCT: 28.2 % — ABNORMAL LOW (ref 39.0–52.0)
HCT: 29.5 % — ABNORMAL LOW (ref 39.0–52.0)
Hemoglobin: 10.1 g/dL — ABNORMAL LOW (ref 13.0–17.0)
Hemoglobin: 9.5 g/dL — ABNORMAL LOW (ref 13.0–17.0)
Hemoglobin: 9.6 g/dL — ABNORMAL LOW (ref 13.0–17.0)
MCHC: 33.8 g/dL (ref 30.0–36.0)
MCHC: 33.9 g/dL (ref 30.0–36.0)
MCHC: 34.2 g/dL (ref 30.0–36.0)
MCV: 91 fL (ref 78.0–100.0)
MCV: 91.6 fL (ref 78.0–100.0)
Platelets: 185 10*3/uL (ref 150–400)
RBC: 3.64 MIL/uL — ABNORMAL LOW (ref 4.22–5.81)
RBC: 3.02 MIL/uL — ABNORMAL LOW (ref 4.22–5.81)
RDW: 13.7 % (ref 11.5–15.5)
RDW: 13.7 % (ref 11.5–15.5)
WBC: 13.3 10*3/uL — ABNORMAL HIGH (ref 4.0–10.5)
WBC: 12.6 10*3/uL — ABNORMAL HIGH (ref 4.0–10.5)

## 2010-05-30 LAB — HEPATIC FUNCTION PANEL
Alkaline Phosphatase: 48 U/L (ref 39–117)
Indirect Bilirubin: 0.5 mg/dL (ref 0.3–0.9)
Total Bilirubin: 0.6 mg/dL (ref 0.3–1.2)
Total Protein: 5.9 g/dL — ABNORMAL LOW (ref 6.0–8.3)

## 2010-05-30 LAB — CARDIAC PANEL(CRET KIN+CKTOT+MB+TROPI)
Total CK: 95 U/L (ref 7–232)
Troponin I: 0.02 ng/mL (ref 0.00–0.06)

## 2010-05-30 LAB — HEMOGLOBIN AND HEMATOCRIT, BLOOD
HCT: 29.7 % — ABNORMAL LOW (ref 39.0–52.0)
Hemoglobin: 10 g/dL — ABNORMAL LOW (ref 13.0–17.0)

## 2010-05-30 LAB — ABO/RH: ABO/RH(D): O POS

## 2010-05-30 LAB — BASIC METABOLIC PANEL
CO2: 27 mEq/L (ref 19–32)
CO2: 24 mEq/L (ref 19–32)
Calcium: 8.7 mg/dL (ref 8.4–10.5)
Calcium: 8.3 mg/dL — ABNORMAL LOW (ref 8.4–10.5)
Creatinine, Ser: 0.91 mg/dL (ref 0.4–1.5)
GFR calc Af Amer: >60 mL/min (ref >60)
GFR calc non Af Amer: >60 mL/min (ref >60)
Glucose, Bld: 109 mg/dL — ABNORMAL HIGH (ref 70–99)
Sodium: 134 mEq/L — ABNORMAL LOW (ref 135–145)
Sodium: 138 mEq/L (ref 135–145)

## 2010-05-30 LAB — CROSSMATCH: Antibody Screen: NEGATIVE

## 2010-05-30 LAB — GLUCOSE, CAPILLARY
Glucose-Capillary: 100 mg/dL — ABNORMAL HIGH (ref 70–99)
Glucose-Capillary: 126 mg/dL — ABNORMAL HIGH (ref 70–99)

## 2010-05-30 LAB — DIFFERENTIAL
Basophils Relative: 0 % (ref 0–1)
Lymphocytes Relative: 8 % — ABNORMAL LOW (ref 12–46)
Monocytes Relative: 4 % (ref 3–12)
Neutro Abs: 11.6 10*3/uL — ABNORMAL HIGH (ref 1.7–7.7)
Neutrophils Relative %: 88 % — ABNORMAL HIGH (ref 43–77)

## 2010-05-30 LAB — PROTIME-INR: Prothrombin Time: 14.5 seconds (ref 11.6–15.2)

## 2010-05-30 LAB — URINALYSIS, ROUTINE W REFLEX MICROSCOPIC
Bilirubin Urine: NEGATIVE
Glucose, UA: NEGATIVE mg/dL
Ketones, ur: NEGATIVE mg/dL
pH: 6 (ref 5.0–8.0)

## 2010-05-30 LAB — RETICULOCYTES
RBC.: 3.29 MIL/uL — ABNORMAL LOW (ref 4.22–5.81)
Retic Count, Absolute: 32.9 10*3/uL (ref 19.0–186.0)
Retic Ct Pct: 1 % (ref 0.4–3.1)

## 2010-05-30 LAB — VITAMIN B12: Vitamin B-12: 874 pg/mL (ref 211–911)

## 2010-07-10 NOTE — Discharge Summary (Signed)
[name], [name]                  ACCOUNT NO.:  [id number]   MEDICAL RECORD NO.:  [id number]          PATIENT TYPE:  INP   LOCATION:  6525                         FACILITY:  [facility]   PHYSICIAN:  Cristy Hilts. Jacinto Halim, MD       DATE OF BIRTH:  1945-06-30   DATE OF ADMISSION:  05/28/2007  DATE OF DISCHARGE:  05/29/2007                               DISCHARGE SUMMARY   DISCHARGE DIAGNOSIS:  1. Coronary artery disease status post elective Cypher stenting to the      left anterior descending this admission.  2. Normal left ventricular function.  3. Treated hypertension.  4. Non-insulin-dependent diabetes.  5. Treated dyslipidemia.  6. Obesity, the patient is scheduled for an outpatient sleep study.   HOSPITAL COURSE:  The patient is a 65 year old male followed by Dr.  [name].  He has multiple risk factors for coronary disease.  He had  a high risk nuclear scan as an outpatient showing some anterolateral  wall ischemia.  EF was normal at 59%.  He had a catheterization as an  outpatient, which revealed an 80% heavily calcified LAD stenosis.  It  was decided to proceed with elective LAD intervention.  He was put on  Plavix as an outpatient and his medications were up titrated.  The  patient was admitted for elective LAD intervention on May 28, 2007.  This was done by Dr. Jacinto Halim, and he tolerated this well.  He had a Cypher  stent placed.  Dr. Jacinto Halim saw the patient on May 29, 2007, and feels he  can be discharged home.   LABORATORY DATA:  Sodium 137, potassium 3.9, BUN 10, creatinine 0.8,  white count 13.2, hematocrit 38.4, hemoglobin 13.1, and platelets  205,000.  Troponins negative.   DISCHARGE MEDICATIONS:  1. Aspirin 81 mg a day.  2. Aleve b.i.d.  3. Simvastatin 40 mg a day.  4. Plavix 75 mg a day.  5. Coreg 3.125 mg b.i.d.  6. Flonase p.r.n.  7. Trivastal 100 mg 1 tablet a day.  8. Metformin 500 mg once a day.   We will start on Monday; Hytrin 10 mg a day, hydrochlorothiazide  12.5 mg  a day, fish oil 1 g twice a day, glucosamine, cinnamon and cranberry  extract, and nitroglycerin sublingual p.r.n.   DISPOSITION:  The patient is discharged in stable condition and will  follow up with [name] as an outpatient.      Abelino Derrick, P.A.      Cristy Hilts. Jacinto Halim, MD  Electronically Signed    LKK/MEDQ  D:  05/29/2007  T:  05/29/2007  Job:  [id number]   cc:   Dani Gobble, MD  Annia Friendly. Loleta Chance, MD

## 2010-07-10 NOTE — Cardiovascular Report (Signed)
Jordan [name], Jordan [name]                  ACCOUNT NO.:  [id number]   MEDICAL RECORD NO.:  [id number]          PATIENT TYPE:  OIB   LOCATION:  2807                         FACILITY:  [facility]   PHYSICIAN:  Cristy Hilts. Jacinto Halim, MD       DATE OF BIRTH:  Jan 25, 1946   DATE OF PROCEDURE:  05/28/2007  DATE OF DISCHARGE:                            CARDIAC CATHETERIZATION   ATTENDING CARDIOLOGIST:  Dr. Annamaria Boots.   PROCEDURES PERFORMED:  1. Intravascular ultrasound interrogation of the LAD.  2. Rotational atherectomy of the calcific high-grade stenosis of the      mid LAD.  3. Successful PTCA and stenting of the mid LAD with implantation of a      3 x 33 mm Cypher stent.   INDICATIONS:  Jordan [name] is a 65 year old gentleman with  hypertension, diabetes, hyperlipidemia, who has had an abnormal stress  test revealing anterior wall ischemia and he had undergone diagnostic  cardiac catheterization at [facility] on May 01, 2007 and  this had revealed a highly calcified 80% mid LAD stenosis.  He is now  brought to the cardiac catheterization lab for elective angioplasty to  his LAD.  Please see complete details of cardiac catheterization dated  May 01, 2007 dictation.   Briefly:   Left main:  Left main is a large vessel smooth and normal.   LAD:  LAD is a large vessel.  It has a heavy calcification in the mid  LAD.  There appears to be a 60-70% focal stenosis at the diagonal 2  bifurcation.  Otherwise, the LAD is smooth and normal distally.   Circumflex coronary artery is a large vessel giving origin to a small  obtuse marginal I and an AV groove branch.  It is smooth and normal.   INTERVENTION DATA:  The intravascular ultrasound interrogation of the  LAD revealed a high-grade 80-90% calcific stenosis of the mid LAD.  This  is a long segment stenosis measuring about 30 mm.   Successful rotational atherectomy with utilization of a 1.25 mm  rotational atherectomy burr.  Following  the rotational atherectomy, a 3  x 33 mm drug-eluting Cypher stent was implanted at 16 atmospheres  pressure.  This stent was postdilated with a 3.25 x 20 mm Quantum at 16  atmospheres pressure.   Postprocedure intravascular ultrasound interrogation of the stent  revealed excellent wall apposition.  The high-grade stenosis cross  section area of 3.3 was increased to 5.79 mm squared.   RECOMMENDATIONS:  The patient will be counseled on aggressive risk  modification.  Continued exercise, weight loss, and diabetes control is  indicated.   A total of 150 mL of contrast was utilized for interventional procedure.   PROCEDURE:  Under usual sterile precautions, using a 7-French right  femoral arterial access, a 7-French XB 3.5 guide was utilized to engage  the left main coronary artery.  Heparin and Integrilin were utilized for  anticoagulation.  A 0.09 mm floppy guidewire was utilized and crossed  through the LAD.  Then a Galaxy IVUS catheter was advanced over this  wire and careful IVUS interrogation of the LAD was performed.  Then a  1.25 mm burr was utilized and multiple rotation atherectomy crosses were  made through the high-grade stenosis of the LAD.  There was significant  resistance noted, especially in the tightest segment of the LAD.  After  performing rotation atherectomy, angiography revealed excellent results  without any evidence of dissection, haziness, or slow flow.  Then I  decided to stent this with a 3 x 33 mm Cypher which was deployed at 16  atmospheres pressure for a minute and postdilated with a 3.25 x 20 mm  Quantum Maverick at 16 atmospheres pressure x2 for 40 seconds each.  Having performed this, a repeat IVUS was performed of the LAD and  excellent wall apposition was confirmed for the stent.  Then the  guidewire was withdrawn.  Angiography was then performed.  The guide  catheter disengaged and pulled out of the body.  During the procedure,  intracoronary  nitroglycerin was administered.  The patient tolerated the  procedure.  No immediate complication noted.      Cristy Hilts. Jacinto Halim, MD  Electronically Signed     JRG/MEDQ  D:  05/28/2007  T:  05/28/2007  Job:  [id number]   cc:   Annia Friendly. Loleta Chance, MD  Dani Gobble, MD

## 2010-07-10 NOTE — Discharge Summary (Signed)
NAMEDEONDRA, WIGGER                  ACCOUNT NO.:  [id number]   MEDICAL RECORD NO.:  [id number]          PATIENT TYPE:  INP   LOCATION:  6525                         FACILITY:  [facility]   PHYSICIAN:  Abelino Derrick, P.A.   DATE OF BIRTH:  05/18/1945   DATE OF ADMISSION:  05/28/2007  DATE OF DISCHARGE:  05/29/2007                               DISCHARGE SUMMARY   ADDENDUM   Mr. Woodberry had some bruising and ecchymosis around his right femoral  artery site.  It is difficult to tell if he has an actual hematoma  there, but I could not palpate one; he does have significant obesity,  and it is hard to tell.  We will ambulate him this morning, and if he is  stable, we will send him home later on.  He knows to do no strenuous  activity over the weekend and no heavy lifting.  He will call us if  there is any change.      Abelino Derrick, P.ALenard Lance  D:  05/29/2007  T:  05/29/2007  Job:  [id number]

## 2010-07-13 NOTE — Discharge Summary (Signed)
NAME:  Jordan [name], MESICK                            ACCOUNT NO.:  [id number]   MEDICAL RECORD NO.:  [id number]                   PATIENT TYPE:  INP   LOCATION:  A307                                 FACILITY:  [facility]   PHYSICIAN:  Annia Friendly. Loleta Chance, M.D.                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   CHIEF COMPLAINT:  General malaise, fever, and chills.  Symptoms had been  present x4 days.   HISTORY OF PRESENT ILLNESS:  The patient experienced chills on October 16, 2002, at home.  The patient went outside in the sunlight to get warm due to  severe cold feeling.  Moreover, he experienced a shaking episode on October 16, 2002.  History is also positive for feeling of bloating of stomach.  The  patient felt that the bloating of the stomach was related to gas.  History  was no evidence for runny nose, sore throat, cough, diarrhea, rash, nausea,  vomiting, dysuria, gross hematuria, and joint swelling.  At the time of  admission the patient was experiencing a headache and nasal congestion also.  Medical history was positive for hypertension, obesity, and osteoarthritis.  The patient was not allergic to any known medications.  Habits were positive  for former cigarette smoking x20 years.  The patient consumed alcohol only  on social occasions.  Past medical history negative for previous  hospitalization.   FAMILY HISTORY:  Father living, age 61, with history of early dementia and  hypertension.  Mother living, age 54, with history of dementia and thyroid  disease.  One brother living, age 32, history of stroke and hypertension.  One sister living, age 26, health unknown.  One daughter living, age 43,  good health.  One son living, age 64, good health.   PHYSICAL  EXAMINATION:  VITAL SIGNS:  Vital signs on admission were as  follows:  Temperature in the office 102 degrees Fahrenheit, pulse 94,  respirations 22, blood pressure 110/70.  Vital signs on admission to the  medical floor at the hospital were as follows:  Temperature 101.5, pulse 86,  respirations 20, blood pressure 150/99.  GENERAL:  Middle-aged, overweight, medium height, alert black male who  appeared not to feel well but in no apparent respiratory distress.  SKIN:  Negative for lesions.  HEENT:  Eyes negative for discharge.  Nose was negative for discharge.  Posterior pharynx was benign.  LUNGS:  Clear on auscultation.  HEART:  Revealed audible S1, S2.  Without murmur, rub, or gallop.  Rhythm  was regular, and rate within normal limits.  ABDOMEN:  Obese, with hypoactive bowel sounds.  Abdominal exam demonstrated  no tenderness in all four  quadrants.  Abdominal exam also revealed no  palpable masses or organomegaly.  EXTREMITIES:  Demonstrated no edema, no joint swelling, no joint redness, no  joint hotness.  NEUROLOGIC:  Intact.   LABORATORY DATA:  Significant laboratories on admission were as follows:  White count 14,300, hemoglobin 15.3, hematocrit 45.2, platelets 274,000.  Sodium 132, potassium 3.4, chloride 101, CO2 26, glucose 180, BUN 12,  creatinine 1.1, calcium 8.4, total protein 7.5, albumin 3.1, AST 32, ALT 34,  ALP 73, total bilirubin 0.5, direct bilirubin 0.1.  Urinalysis (specific  gravity 1.025, pH 5.0, no leukocyte esterase, moderate amount of hemoglobin,  trace of ketones, trace of protein, 3-6 rbc's).   Chest x-ray was read as stable, mild chronic bronchitic changes, and no  acute abnormality, by Dr. Darrol Angel.  X-ray of sinuses demonstrated no  evidence of sinusitis and prominent bone osteomas in the inferior aspect of  both frontal sinuses medically as read by Dr. Darrol Angel.  X-ray of the  abdomen was read as supine erect view demonstrating a normal  bowel gas  pattern without free peritoneal air; lumbar spine degenerative changes were  noted, as read by Dr. Darrol Angel.   HOSPITAL COURSE:  #1 - ACUTE RESPIRATORY INFECTION:  The patient was treated  with IV fluids, ordering of blood culture, urine culture, IV Rocephin, and  p.o. Levaquin.  The patient's hospital course was uneventful.  He remained  alert and oriented to person, place, and time.  He spiked a temperature of  102.4 at 0400 on October 19, 2002.  The patient was afebrile the next 24  hours before discharge.  Appetite was better.  The patient felt better.  He  was discharged to his home where he lived with his wife.  Repeat white count  on October 20, 2002, was 11,600, hemoglobin of 14.5, and hematocrit of 42.3,  platelets of 268,000.  #2 - HYPERTENSION:  Blood pressure on admission was 110/70 in the office.  Blood pressure reading on admission to the medical floor, 150/99.  The  patient was treated with antihypertensive medicine and low-sodium diet.  The  patient experienced no chest pain or palpitations during this  hospitalization.  EKG demonstrated normal sinus rhythm and was a normal EKG  as read by Dr. Juanetta Gosling.  Ventricular rate was 64.  A chest x-ray  demonstrated normal heart size.  #3 - OSTEOARTHRITIS:  X-ray of abdomen revealed degenerative joint disease  of lumbar spine.  The patient was continued on Celebrex 200 mg p.o. daily.  He was not complaining of joint pain, stiffness during this hospitalization.  Examination of the joints demonstrated no redness, hotness, or swelling.  #4 - OBESITY:  The importance of weight loss was discussed with the patient  pertaining his hypertension and his degenerative joint disease.  The patient  currently is participating in a weight-watcher program.  The patient was  advised to continue the weight-watcher program upon discharge.   INSTRUCTIONS AT THE TIME OF DISCHARGE: 1. Diet:  Low-salt.  2. Activity:  No restrictions.   3. Medications:     A. Biaxin XL a pack x7 days.     B. Hytrin 10 mg 1 tablet p.o. every day.     C. Hydrochlorothiazide 25 mg 1 tablet p.o. every day.     D. Celebrex 200 mg 1 capsule p.o. every day.     E. Deconamine SR 1 capsule p.o. b.i.d.  4. Call the office x1 week.   ADDENDUM:  The patient's serum glucose on admission was 180.  Repeat fasting  blood sugar on October 20, 2002, at 0440 was 144.  The patient will have a  repeat fasting blood sugar and random blood sugar at the time of first  office visit.   FINAL PRIMARY DIAGNOSIS:  Acute respiratory infection.    SECONDARY DIAGNOSES:  1. Hypertension.  2. Osteoarthritis.  3. Obesity.                                               Annia Friendly. Loleta Chance, M.D.    Levonne Hubert  D:  10/21/2002  T:  10/22/2002  Job:  [id number]

## 2010-07-13 NOTE — H&P (Signed)
NAME:  Jordan [name], Jordan [name]                            ACCOUNT NO.:  [id number]   MEDICAL RECORD NO.:  [id number]                   PATIENT TYPE:  OBV   LOCATION:  A307                                 FACILITY:  [facility]   PHYSICIAN:  Annia Friendly. Loleta Chance, M.D.                DATE OF BIRTH:  12/05/1945   DATE OF ADMISSION:  10/18/2002  DATE OF DISCHARGE:                                HISTORY & PHYSICAL   IDENTIFYING DATA:  The patient is a 65 year old, married, retired principal,  black male from [place], [place].   CHIEF COMPLAINT:  General malaise, fever, and chills.   HISTORY OF PRESENT ILLNESS:  The patient has been experiencing these  symptoms x4 days. He experienced significant chills on October 16, 2002 at  home. The patient went outside in the sunlight to get warm due to severe  cold feeling. Moreover he experienced shaking episode on October 16, 2002.  History is also positive for feeling of bloating of his stomach by gas.  History is positive for persistent general malaise over four days, current  frontal headache and current nasal congestion. He denies runny nose, sore  throat, cough, diarrhea, rash, nausea, vomiting, dysuria, gross hematuria  and joint swelling.   PAST MEDICAL HISTORY:  Positive for hypertension, obesity and  osteoarthritis.   PRESCRIBED MEDICATIONS:  Celebrex 200 mg p.o. b.i.d., Hytrin 10 mg p.o.  every day, hydrochlorothiazide 25 mg p.o. every day.   ALLERGIES:  The patient is not allergic to any known medications.   HABITS:  Positive for former use of cigarettes x20 years. The patient  consumes alcohol only on social occasions. Negative for street drugs.   PAST MEDICAL HISTORY:  Negative for previous hospitalization.   FAMILY HISTORY:  Revealed father living age 32 with history of early  dementia and hypertension; mother living age 12 with history of dementia and  thyroid disease; one brother living age 60 with history of stroke and  hypertension;  one sister living age 34 health unknown; one daughter living  age 6 good health and one son living age 4 in good health.   REVIEW OF SYSTEMS:  Positive for episodic left shoulder pain, bilateral knee  pain and stiffness and occasional heel pain. Review of systems is negative  for epistaxis, bleeding gum, hemoptysis, hematemesis, gross hematuria,  melena, diarrhea, constipation, swelling of the leg, penile lesions, penile  discharge and scrotal pain.   PHYSICAL EXAMINATION:  VITAL SIGNS:  In the office temperature 102, pulse  94, respirations 22, blood pressure 110/70.  GENERAL:  Revealed a middle age, over weight, medium height, alert, black  male who appeared not to feel well but in no apparent respiratory distress.  SKIN:  Warm and dry.  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 Ears normal auricle. Eyes,  lids negative for ptosis, sclerae white, pupils round, equal and reactive to  light. Extraocular  movements intact. Nose negative for discharge, positive  for nasal mucosal edema. Mouth positive for positive denture upper,  posterior pharynx benign.  NECK:  Negative for lymphadenopathy or thyromegaly.  LUNGS:  Clear.  HEART:  Audible S1, S2 without murmur, gallop or rub. Regular rate and  rhythm.  BACK:  No CVA tenderness bilaterally.  ABDOMEN:  Obese, hypoactive bowel sounds, soft, nontender all four  quadrants. No palpable masses, no organomegaly.  GENITALIA:  Penis uncircumcised, no penile lesion or discharge. Scrotum  palpable testicles without nodule or tenderness.  RECTAL:  Deferred.  EXTREMITIES:  No edema, no joint swelling, no joint redness, no joint heat,  no tibial edema.  SKIN:  Hot and dry.  NEUROLOGIC:  Alert and oriented to person, place and time. Cranial nerves II-  XII appeared intact.   Urinalysis in the past demonstrated the following:  pH 5.0, no glucose,  ketone 15 mg/DL, specific gravity of 8.295, moderate amount of blood, 30 mg  per/DL of  protein, nitrate negative, leukocyte esterase negative.   IMPRESSION:  Primary fever of unknown origin.   SECONDARY DIAGNOSIS:  1. Hypertension.  2. Osteoarthritis.   PLAN:  1. IV fluids.  2. Blood culture x2.  3. Urine culture.  4. IV antibiotics.  5. Chest x-ray.  6. X-ray of sinuses.  7. EKG.   DIET:  4 g sodium.   MEDICATIONS:  1. Hytrin 10 mg p.o. every day.  2. Celebrex 200 mg p.o. b.i.d. with meals.  3. Hydrochlorothiazide 25 mg p.o. every day.  4. Protonix 40 mg p.o. daily.                                               Annia Friendly. Loleta Chance, M.D.    Levonne Hubert  D:  10/18/2002  T:  10/18/2002  Job:  [id number]

## 2010-07-13 NOTE — H&P (Signed)
   NAME:  Jordan [name], Jordan [name]                            ACCOUNT NO.:  [id number]   MEDICAL RECORD NO.:  [id number]                   PATIENT TYPE:  AMB   LOCATION:  DAY                                  FACILITY:  [facility]   PHYSICIAN:  Jerolyn Shin C. Katrinka Blazing, M.D.                DATE OF BIRTH:  [birth year]   DATE OF ADMISSION:  09/30/2001  DATE OF DISCHARGE:                                HISTORY & PHYSICAL   HISTORY OF PRESENT ILLNESS:  A 65 year old male scheduled for screening  colonoscopy.  The patient has no known problem with bowel movements.  No  bleeding; although he does have hemorrhoids.  There is no family history of  colon disease or colon cancer.   PAST HISTORY:  The patient has osteoarthritis, hypertension.  He has had no  major surgery.   MEDICATIONS:  1. Celebrex 200 mg q.d.  2. Hydrochlorothiazide 25 mg 1/2 q.d.  3. Hytrin 20 mg q.d.  4. Aspirin one p.o. q.d.   PHYSICAL EXAMINATION:  VITAL SIGNS:  Blood pressure 144/90, pulse 72,  respirations 18, weight 300.  HEENT:  Unremarkable.  NECK:  Supple without JVD or cords.  CHEST:  Clear to auscultation.  HEART:  Regular rate and rhythm without murmurs, rubs or gallops.  ABDOMEN:  Obese, soft and nontender.  RECTAL:  Normal.  Small external hemorrhoids.  No internal hemorrhoids.  No  masses.  Stool guaiac-negative.  EXTREMITIES:  Mild tenderness of left knee.  No effusions.  NEUROLOGIC:  No focal motor, sensory or cerebellar deficits.   IMPRESSION:  1. Need for screening colonoscopy.  2. Hypertension.  3. Osteoarthritis.  4. Morbid obesity.   PLAN:  Screening colonoscopy.                                               Dirk Dress. Katrinka Blazing, M.D.    LCS/MEDQ  D:  09/29/2001  T:  09/30/2001  Job:  [id number]

## 2010-11-20 LAB — BASIC METABOLIC PANEL
CO2: 26
GFR calc Af Amer: >60
GFR calc non Af Amer: >60
Glucose, Bld: 110 — ABNORMAL HIGH
Potassium: 3.9
Sodium: 137

## 2010-11-20 LAB — CBC
HCT: 38.4 — ABNORMAL LOW
Hemoglobin: 13.1
RBC: 4.38
RDW: 14.3

## 2010-11-20 LAB — CK TOTAL AND CKMB (NOT AT ARMC): Relative Index: 1.7

## 2011-01-21 ENCOUNTER — Other Ambulatory Visit: Payer: Self-pay | Admitting: Gastroenterology

## 2011-01-21 MED ORDER — PANTOPRAZOLE SODIUM 40 MG PO TBEC: 40.0000 mg | DELAYED_RELEASE_TABLET | Freq: Every day | ORAL | Status: DC

## 2011-01-21 NOTE — Telephone Encounter (Signed)
Addended by: Chrystie Nose R on: 01/21/2011 02:19 PM   Modules accepted: Orders

## 2011-01-21 NOTE — Telephone Encounter (Signed)
One refill sent to pharmacy for protonix. Pt informed he needs a visit for more refills. Pt scheduled to see Dr. Christella Hartigan. 01-29-11@9 :45am. Pt aware of appt date and time.

## 2011-01-22 MED ORDER — PANTOPRAZOLE SODIUM 40 MG PO TBEC: 40.0000 mg | DELAYED_RELEASE_TABLET | Freq: Every day | ORAL | Status: DC

## 2011-01-22 NOTE — Telephone Encounter (Signed)
Addended by: Donata Duff on: 01/22/2011 07:52 AM   Modules accepted: Orders

## 2011-01-29 ENCOUNTER — Ambulatory Visit (INDEPENDENT_AMBULATORY_CARE_PROVIDER_SITE_OTHER): Payer: Medicare Other | Admitting: Gastroenterology

## 2011-01-29 ENCOUNTER — Encounter: Payer: Self-pay | Admitting: Gastroenterology

## 2011-01-29 VITALS — BP 140/80 | HR 54 | Ht 67.0 in | Wt 305.6 lb

## 2011-01-29 DIAGNOSIS — K279 Peptic ulcer, site unspecified, unspecified as acute or chronic, without hemorrhage or perforation: Secondary | ICD-10-CM

## 2011-01-29 DIAGNOSIS — K219 Gastro-esophageal reflux disease without esophagitis: Secondary | ICD-10-CM

## 2011-01-29 MED ORDER — PANTOPRAZOLE SODIUM 40 MG PO TBEC: 40.0000 mg | DELAYED_RELEASE_TABLET | Freq: Every day | ORAL | Status: DC

## 2011-01-29 NOTE — Patient Instructions (Signed)
Reordered PPI (protonix) for 1 year. If you are feeling fine from GI standpoint, then I will refill it again in one year. Return to see Dr. Christella Hartigan in 2 years.

## 2011-01-29 NOTE — Progress Notes (Signed)
Review of gastrointestinal problems:  1. Gastric ulcer, November 2010. EGD. Presented with an overt melenic GI bleed. Likely NSAID related. H. pylori biopsies negative. Repeat EGD 3 months later was normal 2. routine risk for colon cancer: Colonoscopy by Dr. Christella Hartigan November 2011 small hyper plastic polyp. Internal hemorrhoids, minor diverticulosis. He was recommended to have repeat examination at 10 year interval for screening  HPI: This is a very pleasant 65 year old man whom I last saw about one year ago.  Feels pretty good.  No gi issues.  He avoids HOT sauce.  No nausea, vomiting.   Overall he has gained a bit of weight.  He swims at [facility].    No overt GI bleeding.  Takes protonix once daily.  He has not tried to stop. He takes aspirin, Plavix daily.   Past Medical History  Diagnosis Date  . GI bleed   . Hypotension     transient  . CAD (coronary artery disease)   . Diabetes mellitus   . Arthritis   . Sleep apnea   . Hypertension     History reviewed. No pertinent past surgical history.  Current Outpatient Prescriptions  Medication Sig Dispense Refill  . acetaminophen (TYLENOL 8 HOUR) 650 MG CR tablet Take 650 mg by mouth every 8 (eight) hours as needed.        Marland Kitchen aspirin 81 MG tablet Take 81 mg by mouth daily.        . carvedilol (COREG) 3.125 MG tablet Take 3.125 mg by mouth 2 (two) times daily with a meal.        . Cinnamon 500 MG capsule Take 500 mg by mouth 2 (two) times daily.        . clopidogrel (PLAVIX) 75 MG tablet Take 75 mg by mouth daily.        . ferrous sulfate 325 (65 FE) MG tablet Take 325 mg by mouth daily with breakfast.        . Garlic Oil 1000 MG CAPS Take by mouth. 1 time daily        . glucosamine-chondroitin 500-400 MG tablet Take 1 tablet by mouth daily.        Marland Kitchen lisinopril (PRINIVIL,ZESTRIL) 5 MG tablet Take 5 mg by mouth daily.        . metFORMIN (GLUCOPHAGE-XR) 500 MG 24 hr tablet Take 500 mg by mouth daily with breakfast.        . Omega-3 Fatty  Acids (FISH OIL) 1000 MG CAPS Take by mouth. 1 by mouth 2 times daily        . pantoprazole (PROTONIX) 40 MG tablet Take 1 tablet (40 mg total) by mouth daily.  30 tablet  0  . simvastatin (ZOCOR) 40 MG tablet Take 40 mg by mouth at bedtime.          Allergies as of 01/29/2011 - Review Complete 01/29/2011  Allergen Reaction Noted  . Codeine      Family History  Problem Relation Age of Onset  . Hypertension Father   . Stroke Brother     History   Social History  . Marital Status: Married    Spouse Name: N/A    Number of Children: 2  . Years of Education: N/A   Occupational History  . retired    Social History Main Topics  . Smoking status: Former Smoker    Quit date: 01/28/1978  . Smokeless tobacco: Never Used  . Alcohol Use: Yes     rarely  . Drug  Use: No  . Sexually Active: Not on file   Other Topics Concern  . Not on file   Social History Narrative  . No narrative on file      Physical Exam: BP 140/80  Pulse 54  Ht 5\' 7"  (1.702 m)  Wt 305 lb 9.6 oz (138.619 kg)  BMI 47.86 kg/m2  SpO2 98% Constitutional: generally well-appearing Psychiatric: alert and oriented x3 Abdomen: soft, nontender, nondistended, no obvious ascites, no peritoneal signs, normal bowel sounds     Assessment and plan: 65 y.o. male with personal history of bleeding gastric ulcer  The ulcer was likely NSAID related, he was on a lot of over-the-counter pain medicines for her sore shoulder at that time. I would like to try to wean him off of proton pump inhibitor however he still needs to be on aspirin once daily for his coronary artery disease. He is also on Plavix daily. I think he should stay on proton pump inhibitor to protect him from his chronic aspirin usage and decrease the chance of future ulcer disease. He'll return to see me in 2 years and sooner if needed.

## 2011-02-05 HISTORY — PX: CARDIOVASCULAR STRESS TEST: SHX262

## 2012-02-13 ENCOUNTER — Telehealth: Payer: Self-pay | Admitting: Gastroenterology

## 2012-02-13 MED ORDER — PANTOPRAZOLE SODIUM 40 MG PO TBEC: 40.0000 mg | DELAYED_RELEASE_TABLET | Freq: Every day | ORAL | Status: DC

## 2012-02-13 NOTE — Telephone Encounter (Signed)
Pt aware rx sent.  

## 2012-11-10 ENCOUNTER — Other Ambulatory Visit: Payer: Self-pay | Admitting: *Deleted

## 2012-11-10 MED ORDER — CLOPIDOGREL BISULFATE 75 MG PO TABS: 75.0000 mg | ORAL_TABLET | Freq: Every day | ORAL | Status: DC

## 2013-01-13 ENCOUNTER — Other Ambulatory Visit (HOSPITAL_COMMUNITY): Payer: Self-pay | Admitting: Cardiovascular Disease

## 2013-01-13 ENCOUNTER — Telehealth (HOSPITAL_COMMUNITY): Payer: Self-pay | Admitting: *Deleted

## 2013-01-13 DIAGNOSIS — I25810 Atherosclerosis of coronary artery bypass graft(s) without angina pectoris: Secondary | ICD-10-CM

## 2013-01-13 NOTE — Telephone Encounter (Signed)
This patient will need to call which ever DME company that provides their supplies.

## 2013-01-13 NOTE — Telephone Encounter (Signed)
Pt would like to know if Dr. Tresa Endo can order him new tubing for his cpap machine. He says is leaking a lot and its making a lot of noise. Please Call

## 2013-01-28 ENCOUNTER — Ambulatory Visit (HOSPITAL_COMMUNITY)
Admission: RE | Admit: 2013-01-28 | Discharge: 2013-01-28 | Disposition: A | Discharge status: Home / Self Care | Payer: Medicare Other | Source: Ambulatory Visit | Attending: Cardiovascular Disease | Admitting: Cardiovascular Disease

## 2013-01-28 DIAGNOSIS — Z9861 Coronary angioplasty status: Secondary | ICD-10-CM | POA: Insufficient documentation

## 2013-01-28 DIAGNOSIS — I2510 Atherosclerotic heart disease of native coronary artery without angina pectoris: Secondary | ICD-10-CM | POA: Insufficient documentation

## 2013-01-28 DIAGNOSIS — I25810 Atherosclerosis of coronary artery bypass graft(s) without angina pectoris: Secondary | ICD-10-CM

## 2013-01-28 DIAGNOSIS — R0609 Other forms of dyspnea: Secondary | ICD-10-CM | POA: Insufficient documentation

## 2013-01-28 DIAGNOSIS — R0989 Other specified symptoms and signs involving the circulatory and respiratory systems: Secondary | ICD-10-CM | POA: Insufficient documentation

## 2013-01-28 DIAGNOSIS — E119 Type 2 diabetes mellitus without complications: Secondary | ICD-10-CM | POA: Insufficient documentation

## 2013-01-28 DIAGNOSIS — E669 Obesity, unspecified: Secondary | ICD-10-CM | POA: Insufficient documentation

## 2013-01-28 DIAGNOSIS — I10 Essential (primary) hypertension: Secondary | ICD-10-CM | POA: Insufficient documentation

## 2013-01-28 DIAGNOSIS — Z87891 Personal history of nicotine dependence: Secondary | ICD-10-CM | POA: Insufficient documentation

## 2013-01-28 DIAGNOSIS — R5381 Other malaise: Secondary | ICD-10-CM | POA: Insufficient documentation

## 2013-01-28 MED ORDER — TECHNETIUM TC 99M SESTAMIBI GENERIC - CARDIOLITE
12.2000 | Freq: Once | INTRAVENOUS | Status: AC | PRN
  Administered 2013-01-28: 12 via INTRAVENOUS

## 2013-01-28 MED ORDER — TECHNETIUM TC 99M SESTAMIBI GENERIC - CARDIOLITE
36.6000 | Freq: Once | INTRAVENOUS | Status: AC | PRN
  Administered 2013-01-28: 37 via INTRAVENOUS

## 2013-01-28 MED ORDER — REGADENOSON 0.4 MG/5ML IV SOLN
0.4000 mg | Freq: Once | INTRAVENOUS | Status: AC
Start: 2013-01-28 — End: 2013-01-28
  Administered 2013-01-28: 0.4 mg via INTRAVENOUS

## 2013-01-28 NOTE — Procedures (Addendum)
[facility] [address] [place] [address] [phone]  Cardiology Nuclear Med Study  Jordan [name] is a 67 y.o. male     MRN : [id number]     DOB: 1945/11/23  Procedure Date: 01/28/2013  Nuclear Med Background Indication for Stress Test:  Stent Patency History:  CAD; STENT/PTCA-05/2007 Cardiac Risk Factors: History of Smoking, Hypertension, NIDDM and Obesity  Symptoms:  DOE and Fatigue   Nuclear Pre-Procedure Caffeine/Decaff Intake:  7:00pm NPO After: 5:00am   IV Site: R Hand  IV 0.9% NS with Angio Cath:  22g  Chest Size (in):  50"  IV Started by: Emmit Pomfret, RN  Height: 5\' 7"  (1.702 m)  Cup Size: n/a  BMI:  Body mass index is 47.76 kg/(m^2). Weight:  305 lb (138.347 kg)   Tech Comments:  N/A    Nuclear Med Study 1 or 2 day study: 1 day  Stress Test Type:  Lexiscan  Order Authorizing Provider: Nicki Guadalajara, MD   Resting Radionuclide: Technetium 32m Sestamibi  Resting Radionuclide Dose: 12.2 mCi   Stress Radionuclide:  Technetium 2m Sestamibi  Stress Radionuclide Dose: 36.6 mCi           Stress Protocol Rest HR: 47 Stress HR:71  Rest BP:120/72 Stress BP: 135/70  Exercise Time (min): n/a METS: n/a          Dose of Adenosine (mg):  n/a Dose of Lexiscan: 0.4 mg  Dose of Atropine (mg): n/a Dose of Dobutamine: n/a mcg/kg/min (at max HR)  Stress Test Technologist: Ernestene Mention, CCT Nuclear Technologist: Gonzella Lex, CNMT   Rest Procedure:  Myocardial perfusion imaging was performed at rest 45 minutes following the intravenous administration of Technetium 56m Sestamibi. Stress Procedure:  The patient received IV Lexiscan 0.4 mg over 15-seconds.  Technetium 81m Sestamibi injected at 30-seconds.  There were no significant changes with Lexiscan.  Quantitative spect images were obtained after a 45 minute delay.  Transient Ischemic Dilatation (Normal <1.22):  1.12 Lung/Heart Ratio (Normal <0.45):  0.31 QGS  EDV:  107 ml QGS ESV:  48 ml LV Ejection Fraction: 56%     Rest ECG: NSR - Normal EKG  Stress ECG: No significant change from baseline ECG  QPS Raw Data Images:  Normal; no motion artifact; normal heart/lung ratio. Stress Images:  Normal homogeneous uptake in all areas of the myocardium. Rest Images:  Normal homogeneous uptake in all areas of the myocardium. Subtraction (SDS):  No evidence of ischemia. LV Wall Motion:  NL LV Function; NL Wall Motion  Impression Exercise Capacity:  Lexiscan with no exercise. BP Response:  Normal blood pressure response. Clinical Symptoms:  No significant symptoms noted. ECG Impression:  No significant ECG changes with Lexiscan. Comparison with Prior Nuclear Study: No significant change from previous study   Overall Impression:  Normal stress nuclear study.   Thurmon Fair, MD  01/28/2013 12:18 PM

## 2013-01-29 ENCOUNTER — Encounter: Payer: Self-pay | Admitting: Internal Medicine

## 2013-02-14 ENCOUNTER — Encounter: Payer: Self-pay | Admitting: *Deleted

## 2013-03-03 ENCOUNTER — Ambulatory Visit: Payer: Medicare Other | Admitting: Cardiovascular Disease

## 2013-03-04 ENCOUNTER — Ambulatory Visit (INDEPENDENT_AMBULATORY_CARE_PROVIDER_SITE_OTHER): Payer: Medicare Other | Admitting: Cardiovascular Disease

## 2013-03-04 ENCOUNTER — Encounter: Payer: Self-pay | Admitting: Cardiovascular Disease

## 2013-03-04 VITALS — BP 136/75 | HR 58 | Ht 67.0 in | Wt 306.5 lb

## 2013-03-04 DIAGNOSIS — E119 Type 2 diabetes mellitus without complications: Secondary | ICD-10-CM

## 2013-03-04 DIAGNOSIS — K219 Gastro-esophageal reflux disease without esophagitis: Secondary | ICD-10-CM | POA: Insufficient documentation

## 2013-03-04 DIAGNOSIS — E785 Hyperlipidemia, unspecified: Secondary | ICD-10-CM

## 2013-03-04 DIAGNOSIS — G4733 Obstructive sleep apnea (adult) (pediatric): Secondary | ICD-10-CM

## 2013-03-04 DIAGNOSIS — Z9989 Dependence on other enabling machines and devices: Secondary | ICD-10-CM

## 2013-03-04 DIAGNOSIS — I2510 Atherosclerotic heart disease of native coronary artery without angina pectoris: Secondary | ICD-10-CM | POA: Insufficient documentation

## 2013-03-04 DIAGNOSIS — R42 Dizziness and giddiness: Secondary | ICD-10-CM | POA: Insufficient documentation

## 2013-03-04 NOTE — Progress Notes (Signed)
Patient ID: Jordan [name], male   DOB: 03-06-1945, 68 y.o.   MRN: [id number]     HPI: Jordan [name] is a 68 y.o. male who presents to the office today for one-year cardiology evaluation.  Jordan [name] is a 68 year old gentleman whose problems include: #1 coronary artery disease: In April 2009 he underwent high-speed rotational atherectomy of focally calcified LAD stenosis which time a 3.0x33 mm Cypher DES stent was inserted. #2 severe obstructive sleep apnea. This was diagnosed in 2009. He has been on CPAP therapy since that time. Apparently he is in need of new supplies and will need to be set up with a new [employer]. #3 morbid obesity: Over the past year, he has not been successful with weight loss and weight has been fairly constant. #4 type 2 diabetes mellitus: He now is on metformin 500 mg daily and is also participating in a [facility] study. #5 mild hypertension: Currently on lisinopril 5 mg as well as carvedilol #6 GERD: Relatively stable on Protonix  Since I last saw Jordan [name], he underwent a two-year followup nuclear perfusion study which was done on 01/28/2013. Ejection fraction was 56%. Scintigraphic images showed normal perfusion without scar or ischemia.  I did review lab work from the [facility]. This had shown a sodium 1:30 potassium 4.3) 08 creatinine 0.82 total cholesterol was 125 triglycerides 70 HDL 48 VLDL 14 LDL 63. Liver function studies were normal. Hemoglobin A1c was 7.2.  Jordan [name] continues to be active. He states he swims 3-4 days per week typically during a dog paddle but for perhaps a mile. He did have some issues is here with vertigo. He denies any episodes of chest tightness. He denies significant shortness of breath. With reference to his obstructive sleep apnea he is in need of a new mask since his last fullface mask is of several years duration and apparently is significantly leaking.  Past Medical History  Diagnosis Date  . GI bleed   . Hypotension     transient    . CAD (coronary artery disease)   . Diabetes mellitus   . Arthritis   . OSA (obstructive sleep apnea)     Severe, on CPAP therapy  . Hypertension     Past Surgical History  Procedure Laterality Date  . Lower arterial doppler  06/03/2007    No evidence of dissection, AV fistula, or active pseudoaneurysm.  . Cardiac catheterization  05/01/2007    Recommend rotational atherectomy followed by PTCA and probable stenting of LAD.  Marland Kitchen Cardiac catheterization  05/28/2007    Mid LAD hihgh grade 80-90% stenosis, stented with a 3x47m Cypher stent implanted at 16atm, postdilated with a 3.25x272mQuantum at 16atm, which revealed excellent wall appoistion - 5.7918m. Cardiovascular stress test  02/05/2011    Perfusion defect seen in inferior myocardial region consistent with diagphragmatic attenuation. Remaining myocardium demonstrates normal myocardial perfusion with no evidence of ischemia or infarct. ECG is positive for ischemia.  . Transthoracic echocardiogram  03/13/2010    EF >55>43%ormal diastolic function, mild MR, mild TR, and normal RVSP  . Sleep study  05/13/2007    AHI-11.52/hr, AHI REM-56.0/hr, RDI-11.7/hr, RDI REM-56.0/hr, avg oxygen sat-94%  . Cpap/bipap sleep study  07/01/2007    AHI-0.5/hr at 11cm water pressure, RDI-12.6/hr    Allergies  Allergen Reactions  . Codeine     Current Outpatient Prescriptions  Medication Sig Dispense Refill  . acetaminophen (TYLENOL 8 HOUR) 650 MG CR tablet Take 650 mg by  mouth every 8 (eight) hours as needed.        Marland Kitchen aspirin 81 MG tablet Take 81 mg by mouth daily.        . carvedilol (COREG) 3.125 MG tablet Take 3.125 mg by mouth 2 (two) times daily with a meal.        . CINNAMON PO Take 1 tablet by mouth 3 (three) times daily. 1000 mg tablets      . clopidogrel (PLAVIX) 75 MG tablet Take 1 tablet (75 mg total) by mouth daily.  30 tablet  6  . Garlic Oil 1761 MG CAPS Take by mouth. 1 time daily        . glucosamine-chondroitin 500-400 MG tablet Take 1  tablet by mouth daily.        Javier Docker Oil 1000 MG CAPS Take 1 capsule by mouth.      Marland Kitchen lisinopril (PRINIVIL,ZESTRIL) 5 MG tablet Take 5 mg by mouth daily.        . metFORMIN (GLUCOPHAGE-XR) 500 MG 24 hr tablet Take 500 mg by mouth daily with breakfast.        . NON FORMULARY Beta Prostate. Take 1 capsule by mouth twice daily.      . NON FORMULARY CPAP      . Omega-3 Fatty Acids (FISH OIL) 1000 MG CAPS Take by mouth. 1 by mouth 2 times daily        . pantoprazole (PROTONIX) 40 MG tablet Take 1 tablet (40 mg total) by mouth daily.  30 tablet  11  . sildenafil (REVATIO) 20 MG tablet Take 20 mg by mouth as needed.      . simvastatin (ZOCOR) 40 MG tablet Take 40 mg by mouth at bedtime.        . vitamin B-12 (CYANOCOBALAMIN) 500 MCG tablet Take 1,000 mcg by mouth daily.      . vitamin C (ASCORBIC ACID) 500 MG tablet Take 500 mg by mouth 4 (four) times daily.      . pantoprazole (PROTONIX) 40 MG tablet Take 1 tablet (40 mg total) by mouth daily.  30 tablet  11   No current facility-administered medications for this visit.    History   Social History  . Marital Status: Married    Spouse Name: N/A    Number of Children: 2  . Years of Education: N/A   Occupational History  . retired    Social History Main Topics  . Smoking status: Former Smoker    Quit date: 01/28/1978  . Smokeless tobacco: Never Used  . Alcohol Use: Yes     Comment: rarely  . Drug Use: No  . Sexual Activity: Not on file   Other Topics Concern  . Not on file   Social History Narrative  . No narrative on file   Socially he is married and has 2 children. No tobacco or alcohol use.  Family History  Problem Relation Age of Onset  . Hypertension Father   . Alzheimer's disease Father   . Parkinson's disease Father   . Stroke Brother   . Hypertension Brother   . Alzheimer's disease Mother   . Alzheimer's disease Maternal Grandmother   . Stroke Maternal Grandfather   . Heart disease Maternal Grandfather   .  Stroke Paternal Grandmother   . Heart attack Paternal Grandfather     ROS is negative for fevers, chills or night sweats. He denies headache. He denies visual changes. He did have some difficulty earlier this year with  vertigo. He is unaware lymphadenopathy. He denies shortness of breath. He denies wheezing. There is no PND or orthopnea. He denies any recent anginal symptoms. He is unaware of palpitations. He does have GERD intermittently. He denies nausea vomiting or diarrhea. Is unaware of blood in the stool or urine. He denies claudication symptoms. At times he does note some occasional leg swelling. He does have severe obstructive sleep apnea and uses CPAP with 100% compliance but unfortunately has not had a new mask and in over 2 years. He denies neurologic symptoms.  Other comprehensive 14 point system review is negative.  PE BP 136/75  Pulse 58  Ht '5\' 7"'  (1.702 m)  Wt 306 lb 8 oz (139.027 kg)  BMI 47.99 kg/m2  General: Alert, oriented, no distress; morbidly obese.  Skin: normal turgor, no rashes HEENT: Normocephalic, atraumatic. Pupils round and reactive; sclera anicteric;no lid lag. Extraocular muscles intact. Nose without nasal septal hypertrophy Mouth/Parynx benign; Mallinpatti scale 3 Neck: No JVD, no carotid bruits; normal carotid upstroke Lungs: clear to ausculatation and percussion; no wheezing or rales Chest wall: no tenderness to palpitation Heart: RRR, s1 s2 normal 1/6 sem Abdomen: Significant central or possibly soft, nontender; no hepatosplenomehaly, BS+; abdominal aorta nontender and not dilated by palpation. Back: no CVA tenderness Pulses 2+ Extremities: no clubbing cyanosis or edema, Homan's sign negative  Neurologic: grossly nonfocal; cranial nerves grossly normal. Psychologic: normal affect and mood.   LABS:  BMET    Component Value Date/Time   NA 135 01/25/2009 0220   K 3.5 DELTA CHECK NOTED REPEATED TO VERIFY 01/25/2009 0220   CL 108 01/25/2009 0220   CO2  25 01/25/2009 0220   GLUCOSE 95 01/25/2009 0220   BUN 16 01/25/2009 0220   CREATININE 0.73 01/25/2009 0220   CALCIUM 7.8* 01/25/2009 0220   GFRNONAA >60 01/25/2009 0220   GFRAA  Value: >60        The eGFR has been calculated using the MDRD equation. This calculation has not been validated in all clinical situations. eGFR's persistently <60 mL/min signify possible Chronic Kidney Disease. 01/25/2009 0220     Hepatic Function Panel     Component Value Date/Time   PROT 5.9* 01/23/2009 1930   ALBUMIN 2.9* 01/23/2009 1930   AST 18 01/23/2009 1930   ALT 21 01/23/2009 1930   ALKPHOS 48 01/23/2009 1930   BILITOT 0.6 01/23/2009 1930   BILIDIR 0.1 01/23/2009 1930   IBILI 0.5 01/23/2009 1930     CBC    Component Value Date/Time   WBC 10.3 03/28/2009 1519   RBC 4.52 03/28/2009 1519   RBC 3.29* 01/23/2009 2114   HGB 13.3 03/28/2009 1519   HCT 40.9 03/28/2009 1519   PLT 234.0 03/28/2009 1519   MCV 90.6 03/28/2009 1519   MCHC 32.5 03/28/2009 1519   RDW 12.8 03/28/2009 1519   LYMPHSABS 2.2 03/28/2009 1519   MONOABS 1.1* 03/28/2009 1519   EOSABS 0.4 03/28/2009 1519   BASOSABS 0.1 03/28/2009 1519     BNP No results found for this basename: probnp    Lipid Panel  No results found for this basename: chol, trig, hdl, cholhdl, vldl, ldlcalc     RADIOLOGY: No results found.    ASSESSMENT AND PLAN: Mr. Kullen Tomasetti is now almost 6 years following his high-speed rotational arthrectomy of the severely calcified focal LAD stenosis and ultimate insertion of a 3.0x30 mm DES Cypher stent. Clinically, he is doing well with reference to his coronary obstructive disease without recurrent anginal symptoms. I  reviewed his nuclear perfusion study in detail with him and reviewed the images. This present study continues to suggest patency of this vessel with normal perfusion. With reference to his obstructive sleep apnea, and he has been on therapy since 2009. He is in need for new equipment. I am referring him to [facility]  for supplies. I discussed with him also the fact that his machine is now over 9 years old and if there is any malfunction with his machine he would qualify for upgrade with a new machine. He denies any significant residual daytime sleepiness. His blood pressure today is currently well-controlled on combination therapy with low-dose carvedilol and lisinopril. His diabetes is still slightly elevated with a hemoglobin A1c of 7.2. He tells me he recently was enrolled in a study through [facility] for further evaluation. Presently, his lipids are doing well on his current dose of simvastatin. He is continuing on dual anti-platelet therapy and with reference to the DAPT trial I recommended continuation of this indefinitely. I have encouraged additional weight loss and increased exercise. I will see him in one year for cardiology reassessment or sooner if problems arise.     Troy Sine, MD, [facility]  03/04/2013 11:00 AM

## 2013-03-04 NOTE — Patient Instructions (Signed)
Your physician recommends that you schedule a follow-up appointment in: 1 year  

## 2013-03-09 ENCOUNTER — Telehealth: Payer: Self-pay | Admitting: Cardiovascular Disease

## 2013-03-09 NOTE — Telephone Encounter (Signed)
Please call-forget  This message

## 2013-03-12 ENCOUNTER — Telehealth: Payer: Self-pay | Admitting: Cardiovascular Disease

## 2013-03-12 NOTE — Telephone Encounter (Signed)
Please call-still have not received his new sleep mask for his sleep machine. You can try him on his cell [phone].

## 2013-03-12 NOTE — Telephone Encounter (Signed)
Returned call and pt verified x 2.  Pt informed message received and Mariann Laster will be notified to contact him w/ update in the process of new company.  Pt informed Mariann Laster is out of the office today and on Monday and he may not receive a call back before Tuesday.  Pt verbalized understanding and agreed w/ plan.  Stated his mask is leaking and making noise and he would appreciate a new one as soon as possible.   Message forwarded to [place], [place].

## 2013-03-16 ENCOUNTER — Telehealth: Payer: Self-pay | Admitting: Gastroenterology

## 2013-03-16 MED ORDER — PANTOPRAZOLE SODIUM 40 MG PO TBEC: 40.0000 mg | DELAYED_RELEASE_TABLET | Freq: Every day | ORAL | Status: DC

## 2013-03-16 NOTE — Telephone Encounter (Signed)
Faxed referral and order along with sleep studies to [facility].

## 2013-03-16 NOTE — Telephone Encounter (Signed)
1 refill sent to last until appt, no further refills until appt is kept

## 2013-03-17 ENCOUNTER — Telehealth: Payer: Self-pay | Admitting: Cardiovascular Disease

## 2013-03-17 NOTE — Telephone Encounter (Signed)
Need to find ou t if pt was authorized.Wrong message for wrong person.

## 2013-03-23 ENCOUNTER — Encounter: Payer: Self-pay | Admitting: Gastroenterology

## 2013-03-23 ENCOUNTER — Ambulatory Visit (INDEPENDENT_AMBULATORY_CARE_PROVIDER_SITE_OTHER): Payer: Medicare Other | Admitting: Gastroenterology

## 2013-03-23 VITALS — BP 108/72 | HR 80 | Ht 67.0 in | Wt 308.0 lb

## 2013-03-23 DIAGNOSIS — K259 Gastric ulcer, unspecified as acute or chronic, without hemorrhage or perforation: Secondary | ICD-10-CM

## 2013-03-23 MED ORDER — PANTOPRAZOLE SODIUM 40 MG PO TBEC: 40.0000 mg | DELAYED_RELEASE_TABLET | Freq: Every day | ORAL | Status: DC

## 2013-03-23 NOTE — Progress Notes (Signed)
Review of gastrointestinal problems:  1. Gastric ulcer, November 2010. EGD. Presented with an overt melenic GI bleed. Likely NSAID related. H. pylori biopsies negative. Repeat EGD 3 months later was normal.  I recommended he stay on proton pump inhibitor once daily given its history gastric ulcer, permanent need for aspirin and Plavix. 2. routine risk for colon cancer: Colonoscopy by Dr. Ardis Hughs November 2011 small hyper plastic polyp. Internal hemorrhoids, minor diverticulosis. He was recommended to have repeat examination at 10 year interval for screening   HPI: This is a  very pleasant 68 year old man whom I last saw a little over 2 years ago.  Overall weight about stable in past 2-3 years, still morbidly obese.  Doing well from GI perspective.  Still taking PPI (protonix one pill once daily).  Does not skip days.  Weight stable.   Swims 4-5 days per week.  No overt GI bleeding.  Past Medical History  Diagnosis Date  . GI bleed   . Hypotension     transient  . CAD (coronary artery disease)   . Diabetes mellitus   . Arthritis   . OSA (obstructive sleep apnea)     Severe, on CPAP therapy  . Hypertension     Past Surgical History  Procedure Laterality Date  . Lower arterial doppler  06/03/2007    No evidence of dissection, AV fistula, or active pseudoaneurysm.  . Cardiac catheterization  05/01/2007    Recommend rotational atherectomy followed by PTCA and probable stenting of LAD.  Marland Kitchen Cardiac catheterization  05/28/2007    Mid LAD hihgh grade 80-90% stenosis, stented with a 3x61mm Cypher stent implanted at 16atm, postdilated with a 3.25x74mm Quantum at 16atm, which revealed excellent wall appoistion - 5.70mm  . Cardiovascular stress test  02/05/2011    Perfusion defect seen in inferior myocardial region consistent with diagphragmatic attenuation. Remaining myocardium demonstrates normal myocardial perfusion with no evidence of ischemia or infarct. ECG is positive for ischemia.  .  Transthoracic echocardiogram  03/13/2010    EF >62%, normal diastolic function, mild MR, mild TR, and normal RVSP  . Sleep study  05/13/2007    AHI-11.52/hr, AHI REM-56.0/hr, RDI-11.7/hr, RDI REM-56.0/hr, avg oxygen sat-94%  . Cpap/bipap sleep study  07/01/2007    AHI-0.5/hr at 11cm water pressure, RDI-12.6/hr    Current Outpatient Prescriptions  Medication Sig Dispense Refill  . acetaminophen (TYLENOL 8 HOUR) 650 MG CR tablet Take 650 mg by mouth every 8 (eight) hours as needed.        Marland Kitchen aspirin 81 MG tablet Take 81 mg by mouth daily.        . carvedilol (COREG) 3.125 MG tablet Take 3.125 mg by mouth 2 (two) times daily with a meal.        . CINNAMON PO Take 1 tablet by mouth 3 (three) times daily. 1000 mg tablets      . clopidogrel (PLAVIX) 75 MG tablet Take 1 tablet (75 mg total) by mouth daily.  30 tablet  6  . Garlic Oil 7035 MG CAPS Take by mouth. 1 time daily        . glucosamine-chondroitin 500-400 MG tablet Take 1 tablet by mouth daily.        Javier Docker Oil 1000 MG CAPS Take 1 capsule by mouth.      Marland Kitchen lisinopril (PRINIVIL,ZESTRIL) 5 MG tablet Take 5 mg by mouth daily.        . metFORMIN (GLUCOPHAGE-XR) 500 MG 24 hr tablet Take 500 mg by mouth daily  with breakfast.        . NON FORMULARY Beta Prostate. Take 1 capsule by mouth twice daily.      . NON FORMULARY CPAP      . Omega-3 Fatty Acids (FISH OIL) 1000 MG CAPS Take by mouth. 1 by mouth 2 times daily        . pantoprazole (PROTONIX) 40 MG tablet Take 1 tablet (40 mg total) by mouth daily. No further refills until appt  30 tablet  0  . sildenafil (REVATIO) 20 MG tablet Take 20 mg by mouth as needed.      . simvastatin (ZOCOR) 40 MG tablet Take 40 mg by mouth at bedtime.        . vitamin B-12 (CYANOCOBALAMIN) 500 MCG tablet Take 1,000 mcg by mouth daily.      . vitamin C (ASCORBIC ACID) 500 MG tablet Take 500 mg by mouth 4 (four) times daily.       No current facility-administered medications for this visit.    Allergies as of  03/23/2013 - Review Complete 03/23/2013  Allergen Reaction Noted  . Codeine      Family History  Problem Relation Age of Onset  . Hypertension Father   . Alzheimer's disease Father   . Parkinson's disease Father   . Stroke Brother   . Hypertension Brother   . Alzheimer's disease Mother   . Alzheimer's disease Maternal Grandmother   . Stroke Maternal Grandfather   . Heart disease Maternal Grandfather   . Stroke Paternal Grandmother   . Heart attack Paternal Grandfather     History   Social History  . Marital Status: Married    Spouse Name: N/A    Number of Children: 2  . Years of Education: N/A   Occupational History  . retired    Social History Main Topics  . Smoking status: Former Smoker    Quit date: 01/28/1978  . Smokeless tobacco: Never Used  . Alcohol Use: Yes     Comment: rarely  . Drug Use: No  . Sexual Activity: Not on file   Other Topics Concern  . Not on file   Social History Narrative  . No narrative on file      Physical Exam: BP 108/72  Pulse 80  Ht 5\' 7"  (1.702 m)  Wt 308 lb (139.708 kg)  BMI 48.23 kg/m2 Constitutional: generally well-appearing except for morbid obesity Psychiatric: alert and oriented x3 Abdomen: soft, nontender, nondistended, no obvious ascites, no peritoneal signs, normal bowel sounds     Assessment and plan: 68 y.o. male with history of gastric ulcer  Yes significant gastric ulcer several years ago while on NSAIDs, aspirin. He has a continued need for aspirin Plavix and I recommended he continue on proton pump inhibitor once daily indefinitely. I have refilled his pantoprazole prescription happy to do so for another year but I would like to see him back in 2 years for further refills.

## 2013-03-23 NOTE — Patient Instructions (Addendum)
Refill of protonix, take one pill once daily. Will refill at one year if needed. Would like you to return in 2 years for follow up.

## 2013-04-23 ENCOUNTER — Other Ambulatory Visit: Payer: Self-pay | Admitting: *Deleted

## 2013-04-23 MED ORDER — LISINOPRIL 5 MG PO TABS: 5.0000 mg | ORAL_TABLET | Freq: Every day | ORAL | Status: DC

## 2013-04-26 ENCOUNTER — Other Ambulatory Visit: Payer: Self-pay

## 2013-04-26 MED ORDER — SIMVASTATIN 40 MG PO TABS: 40.0000 mg | ORAL_TABLET | Freq: Every day | ORAL | Status: DC

## 2013-04-26 NOTE — Telephone Encounter (Signed)
Rx was sent to pharmacy electronically. 

## 2013-07-21 ENCOUNTER — Other Ambulatory Visit: Payer: Self-pay | Admitting: *Deleted

## 2013-07-21 MED ORDER — CARVEDILOL 3.125 MG PO TABS: 3.1250 mg | ORAL_TABLET | Freq: Two times a day (BID) | ORAL | Status: DC

## 2013-07-21 NOTE — Telephone Encounter (Signed)
Rx refill sent to patient pharmacy   

## 2013-08-10 ENCOUNTER — Other Ambulatory Visit: Payer: Self-pay

## 2013-08-10 MED ORDER — CLOPIDOGREL BISULFATE 75 MG PO TABS: 75.0000 mg | ORAL_TABLET | Freq: Every day | ORAL | Status: DC

## 2013-08-10 NOTE — Telephone Encounter (Signed)
Rx was sent to pharmacy electronically. 

## 2014-02-07 ENCOUNTER — Other Ambulatory Visit: Payer: Self-pay | Admitting: Cardiovascular Disease

## 2014-02-07 NOTE — Telephone Encounter (Signed)
Rx has been sent to the pharmacy electronically. ° °

## 2014-03-07 ENCOUNTER — Ambulatory Visit: Payer: Medicare Other | Admitting: Cardiovascular Disease

## 2014-03-08 ENCOUNTER — Ambulatory Visit: Payer: Medicare Other | Admitting: Cardiovascular Disease

## 2014-04-11 ENCOUNTER — Other Ambulatory Visit: Payer: Self-pay | Admitting: Gastroenterology

## 2014-04-11 NOTE — Telephone Encounter (Signed)
Patient needs an office visit for further refills 

## 2014-04-15 ENCOUNTER — Ambulatory Visit (INDEPENDENT_AMBULATORY_CARE_PROVIDER_SITE_OTHER): Payer: Medicare Other | Admitting: Cardiovascular Disease

## 2014-04-15 ENCOUNTER — Encounter: Payer: Self-pay | Admitting: Cardiovascular Disease

## 2014-04-15 VITALS — BP 128/72 | HR 50 | Ht 67.0 in | Wt 298.9 lb

## 2014-04-15 DIAGNOSIS — Z9989 Dependence on other enabling machines and devices: Secondary | ICD-10-CM

## 2014-04-15 DIAGNOSIS — G4733 Obstructive sleep apnea (adult) (pediatric): Secondary | ICD-10-CM

## 2014-04-15 DIAGNOSIS — E785 Hyperlipidemia, unspecified: Secondary | ICD-10-CM

## 2014-04-15 DIAGNOSIS — E119 Type 2 diabetes mellitus without complications: Secondary | ICD-10-CM

## 2014-04-15 DIAGNOSIS — I2510 Atherosclerotic heart disease of native coronary artery without angina pectoris: Secondary | ICD-10-CM

## 2014-04-15 DIAGNOSIS — I2583 Coronary atherosclerosis due to lipid rich plaque: Secondary | ICD-10-CM

## 2014-04-15 NOTE — Progress Notes (Signed)
Patient ID: Jordan [name], male   DOB: [birth year], 69 y.o.   MRN: [id number]   Primary MD: [name]  HPI: Jordan [name] is a 69 y.o. male who presents to the office today for one-year cardiology evaluation.  Jordan [name] is a 8 -year-old African-American American gentleman whose problems include: #1 coronary artery disease: In April 2009 he underwent high-speed rotational atherectomy of focally calcified LAD stenosis which time a 3.0x33 mm Cypher DES stent was inserted.  He continues to be on long-term dual antiplatelet therapy with aspirin/Plavix. #2 severe obstructive sleep apnea. This was diagnosed in 2009. He has been on CPAP therapy since that time.  He admits to continued use but states he is not been using it 100% of the time.  He denies any awareness of breakthrough snoring.  He denies any daytime sleepiness. #3 morbid obesity: Over the past year he has lost a  6 pounds.  He is not completely stringent with his diet. #4 type 2 diabetes mellitus: He now is on metformin 500 mg daily and is also participating in a [facility] study. #5 mild hypertension: Currently on lisinopril 5 mg as well as carvedilol 3.125 twice a day #6 GERD: Relatively stable on Protonix #7 .  Erectile dysfunction: He has been taking Viagra 50 mg.  He will be discussing potential change to Cialis with his primary physician.  A two-year followup nuclear perfusion study  on 01/28/2013 demonstrated an Ejection fraction was 56%. Scintigraphic images showed normal perfusion without scar or ischemia.  He is followed by [name].  Laboratory in 2014 revealed a sodium 130 potassium 4.3) 08 creatinine 0.82 total cholesterol was 125 triglycerides 70 HDL 48 VLDL 14 LDL 63. Liver function studies were normal. Hemoglobin A1c was 7.2.  He tells me [name] had shock checked laboratory several months ago.  His hemoglobin A1c had improved.  He states his lipids were very good.  He was not aware of any abnormalities.  He will try to obtain these  results for me for my review  Jordan [name] continues to be active. He states he swims 3-4 days per week typically during a doggie paddle for approximately a half a mile perhaps.  He denies palpitations.  He denies chest pain.  He denies myalgias.  He denies bleeding.  Past Medical History  Diagnosis Date  . GI bleed   . Hypotension     transient  . CAD (coronary artery disease)   . Diabetes mellitus   . Arthritis   . OSA (obstructive sleep apnea)     Severe, on CPAP therapy  . Hypertension     Past Surgical History  Procedure Laterality Date  . Lower arterial doppler  06/03/2007    No evidence of dissection, AV fistula, or active pseudoaneurysm.  . Cardiac catheterization  05/01/2007    Recommend rotational atherectomy followed by PTCA and probable stenting of LAD.  Marland Kitchen Cardiac catheterization  05/28/2007    Mid LAD hihgh grade 80-90% stenosis, stented with a 3x56m Cypher stent implanted at 16atm, postdilated with a 3.25x24mQuantum at 16atm, which revealed excellent wall appoistion - 5.7971m. Cardiovascular stress test  02/05/2011    Perfusion defect seen in inferior myocardial region consistent with diagphragmatic attenuation. Remaining myocardium demonstrates normal myocardial perfusion with no evidence of ischemia or infarct. ECG is positive for ischemia.  . Transthoracic echocardiogram  03/13/2010    EF >55>18%ormal diastolic function, mild MR, mild TR, and normal RVSP  . Sleep study  05/13/2007    AHI-11.52/hr, AHI REM-56.0/hr, RDI-11.7/hr, RDI REM-56.0/hr, avg oxygen sat-94%  . Cpap/bipap sleep study  07/01/2007    AHI-0.5/hr at 11cm water pressure, RDI-12.6/hr    Allergies  Allergen Reactions  . Codeine     Current Outpatient Prescriptions  Medication Sig Dispense Refill  . acetaminophen (TYLENOL 8 HOUR) 650 MG CR tablet Take 650 mg by mouth every 8 (eight) hours as needed.      Marland Kitchen aspirin 81 MG tablet Take 81 mg by mouth daily.      . carvedilol (COREG) 3.125 MG tablet TAKE  (1) TABLET BY MOUTH TWICE DAILY WITH MEALS. 180 tablet 0  . CINNAMON PO Take 1 tablet by mouth 3 (three) times daily. 1000 mg tablets    . clopidogrel (PLAVIX) 75 MG tablet Take 1 tablet (75 mg total) by mouth daily. 30 tablet 7  . Garlic Oil 1751 MG CAPS Take by mouth. 1 time daily      . glucosamine-chondroitin 500-400 MG tablet Take 1 tablet by mouth daily.      Javier Docker Oil 1000 MG CAPS Take 1 capsule by mouth.    Marland Kitchen lisinopril (PRINIVIL,ZESTRIL) 5 MG tablet Take 1 tablet (5 mg total) by mouth daily. 90 tablet 4  . metFORMIN (GLUCOPHAGE-XR) 500 MG 24 hr tablet Take 500 mg by mouth daily with breakfast.      . NON FORMULARY Beta Prostate. Take 1 capsule by mouth twice daily.    . NON FORMULARY CPAP    . Omega-3 Fatty Acids (FISH OIL) 1000 MG CAPS Take by mouth. 1 by mouth 2 times daily      . pantoprazole (PROTONIX) 40 MG tablet TAKE 1 TABLET BY MOUTH DAILY FOR STOMACH. 30 tablet 1  . sildenafil (REVATIO) 20 MG tablet Take 20 mg by mouth as needed.    . simvastatin (ZOCOR) 40 MG tablet Take 1 tablet (40 mg total) by mouth at bedtime. 90 tablet 3  . vitamin B-12 (CYANOCOBALAMIN) 500 MCG tablet Take 1,000 mcg by mouth daily.    . vitamin C (ASCORBIC ACID) 500 MG tablet Take 500 mg by mouth 4 (four) times daily.     No current facility-administered medications for this visit.    History   Social History  . Marital Status: Married    Spouse Name: N/A  . Number of Children: 2  . Years of Education: N/A   Occupational History  . retired    Social History Main Topics  . Smoking status: Former Smoker    Quit date: 01/28/1978  . Smokeless tobacco: Never Used  . Alcohol Use: Yes     Comment: rarely  . Drug Use: No  . Sexual Activity: Not on file   Other Topics Concern  . Not on file   Social History Narrative  . No narrative on file   Socially he is married and has 2 children. No tobacco or alcohol use.  Family History  Problem Relation Age of Onset  . Hypertension Father     . Alzheimer's disease Father   . Parkinson's disease Father   . Stroke Brother   . Hypertension Brother   . Alzheimer's disease Mother   . Alzheimer's disease Maternal Grandmother   . Stroke Maternal Grandfather   . Heart disease Maternal Grandfather   . Stroke Paternal Grandmother   . Heart attack Paternal Grandfather     ROS General: Negative; No fevers, chills, or night sweats; positive for minimal weight loss HEENT: Negative; No changes  in vision or hearing, sinus congestion, difficulty swallowing Pulmonary: Negative; No cough, wheezing, shortness of breath, hemoptysis Cardiovascular: Negative; No chest pain, presyncope, syncope, palpitations GI: Negative; No nausea, vomiting, diarrhea, or abdominal pain GU: Positive for erectile dysfunction; No dysuria, hematuria, or difficulty voiding Musculoskeletal: Negative; no myalgias, joint pain, or weakness Hematologic/Oncology: Negative; no easy bruising, bleeding Endocrine: Negative; no heat/cold intolerance; no diabetes Neuro: Negative; no changes in balance, headaches Skin: Negative; No rashes or skin lesions Psychiatric: Negative; No behavioral problems, depression Sleep: Negative; No snoring, daytime sleepiness, hypersomnolence, bruxism, restless legs, hypnogognic hallucinations, no cataplexy Other comprehensive 14 point system review is negative.  PE There were no vitals taken for this visit.  General: Alert, oriented, no distress; morbidly obese.  Skin: normal turgor, no rashes HEENT: Normocephalic, atraumatic. Pupils round and reactive; sclera anicteric;no lid lag. Extraocular muscles intact. Nose without nasal septal hypertrophy Mouth/Parynx benign; Mallinpatti scale 3 Neck: No JVD, no carotid bruits; normal carotid upstroke Lungs: clear to ausculatation and percussion; no wheezing or rales Chest wall: no tenderness to palpitation Heart: RRR, s1 s2 normal 1/6 sem; no S3 gallop.  No diastolic murmur rubs thrills or  heaves Abdomen: Significant central or possibly soft, nontender; no hepatosplenomehaly, BS+; abdominal aorta nontender and not dilated by palpation. Back: no CVA tenderness Pulses 2+ Extremities: no clubbing cyanosis or edema, Homan's sign negative  Neurologic: grossly nonfocal; cranial nerves grossly normal. Psychologic: normal affect and mood.  ECG (independently read by me): Sinus bradycardia 50 bpm.  No ectopy.  Normal intervals.   LABS:  BMET    Component Value Date/Time   NA 135 01/25/2009 0220   K 3.5 DELTA CHECK NOTED REPEATED TO VERIFY 01/25/2009 0220   CL 108 01/25/2009 0220   CO2 25 01/25/2009 0220   GLUCOSE 95 01/25/2009 0220   BUN 16 01/25/2009 0220   CREATININE 0.73 01/25/2009 0220   CALCIUM 7.8* 01/25/2009 0220   GFRNONAA >60 01/25/2009 0220   GFRAA  01/25/2009 0220    >60        The eGFR has been calculated using the MDRD equation. This calculation has not been validated in all clinical situations. eGFR's persistently <60 mL/min signify possible Chronic Kidney Disease.     Hepatic Function Panel     Component Value Date/Time   PROT 5.9* 01/23/2009 1930   ALBUMIN 2.9* 01/23/2009 1930   AST 18 01/23/2009 1930   ALT 21 01/23/2009 1930   ALKPHOS 48 01/23/2009 1930   BILITOT 0.6 01/23/2009 1930   BILIDIR 0.1 01/23/2009 1930   IBILI 0.5 01/23/2009 1930     CBC    Component Value Date/Time   WBC 10.3 03/28/2009 1519   RBC 4.52 03/28/2009 1519   RBC 3.29* 01/23/2009 2114   HGB 13.3 03/28/2009 1519   HCT 40.9 03/28/2009 1519   PLT 234.0 03/28/2009 1519   MCV 90.6 03/28/2009 1519   MCHC 32.5 03/28/2009 1519   RDW 12.8 03/28/2009 1519   LYMPHSABS 2.2 03/28/2009 1519   MONOABS 1.1* 03/28/2009 1519   EOSABS 0.4 03/28/2009 1519   BASOSABS 0.1 03/28/2009 1519     BNP No results found for: PROBNP  Lipid Panel  No results found for: CHOL   RADIOLOGY: No results found.    ASSESSMENT AND PLAN: Jordan [name] is 7 years following his  high-speed rotational arthrectomy of the severely calcified focal LAD stenosis and insertion of a 3.0x30 mm DES Cypher stent. Clinically, he is doing well with reference to his coronary obstructive disease  without recurrent anginal symptoms. His last nuclear perfusion study  continues to suggest patency of this vessel with normal perfusion. With reference to his obstructive sleep apnea, and he has been on therapy since 2009.  He admits to using this most of the time, but not 100% at time.  I again stressed the importance of 100% compliance.  We discussed the adverse consequences if his sleep apnea is left untreated, particularly with reference to potential cardiovascular comorbidities.  He did have a new machine obtain last year and now uses [employer] for his [facility] company.  His blood pressure today is stable on his current regimen consisting of lisinopril 5 mg and low-dose carvedilol 3.125 mg twice a day.  He is bradycardic and I will not further titrate his carvedilol.  He is on simvastatin 40 mg for hyperlipidemia.  He tells me his recent hemoglobin A1c was improved and was now in the 6 range.  I will try to obtain the complete set of laboratory that had been done by [name] for my review.  We again discussed his morbid obesity and the importance of weight loss.  I commended him on his continued exercise regimen.  He has erectile dysfunction and has benefited from Viagra.  He will be discussing potential sialolith with his primary physician.  He is  not on any nitrate therapy.  As long as he remains stable, I will see him in one year for reevaluation or sooner from his arise.  Time spent: 25 minutes   Troy Sine, MD, [facility]  04/15/2014 7:55 AM

## 2014-04-15 NOTE — Patient Instructions (Signed)
Your physician wants you to follow-up in: 1 year or sooner if needed with Dr. Kelly. No changes were made today in your therapy. You will receive a reminder letter in the mail two months in advance. If you don't receive a letter, please call our office to schedule the follow-up appointment. 

## 2014-04-22 ENCOUNTER — Encounter: Payer: Self-pay | Admitting: Cardiovascular Disease

## 2014-04-27 ENCOUNTER — Other Ambulatory Visit: Payer: Self-pay | Admitting: Cardiovascular Disease

## 2014-04-27 NOTE — Telephone Encounter (Signed)
Rx has been sent to the pharmacy electronically. ° °

## 2014-04-29 ENCOUNTER — Encounter: Payer: Self-pay | Admitting: *Deleted

## 2014-05-10 ENCOUNTER — Other Ambulatory Visit: Payer: Self-pay | Admitting: Cardiovascular Disease

## 2014-05-10 ENCOUNTER — Telehealth: Payer: Self-pay | Admitting: Gastroenterology

## 2014-05-10 MED ORDER — PANTOPRAZOLE SODIUM 40 MG PO TBEC: DELAYED_RELEASE_TABLET | ORAL | Status: DC

## 2014-05-10 NOTE — Telephone Encounter (Signed)
Rx(s) sent to pharmacy electronically.  

## 2014-05-10 NOTE — Telephone Encounter (Signed)
rx sent as requested, NO refills unless appt is kept with Janett Billow

## 2014-05-18 ENCOUNTER — Encounter: Payer: Self-pay | Admitting: *Deleted

## 2014-05-23 ENCOUNTER — Encounter: Payer: Self-pay | Admitting: Gastroenterology

## 2014-05-23 ENCOUNTER — Ambulatory Visit (INDEPENDENT_AMBULATORY_CARE_PROVIDER_SITE_OTHER): Payer: Medicare Other | Admitting: Gastroenterology

## 2014-05-23 VITALS — BP 134/70 | HR 56 | Ht 67.0 in | Wt 301.1 lb

## 2014-05-23 DIAGNOSIS — Z8719 Personal history of other diseases of the digestive system: Secondary | ICD-10-CM | POA: Diagnosis not present

## 2014-05-23 DIAGNOSIS — Z8711 Personal history of peptic ulcer disease: Secondary | ICD-10-CM | POA: Insufficient documentation

## 2014-05-23 MED ORDER — PANTOPRAZOLE SODIUM 40 MG PO TBEC: DELAYED_RELEASE_TABLET | ORAL | Status: DC

## 2014-05-23 NOTE — Patient Instructions (Signed)
Your physician has requested that you go to the basement for the following lab work before leaving today:  Protonix

## 2014-05-23 NOTE — Progress Notes (Signed)
     05/23/2014 Jordan [name] [id number] [birth year]   History of Present Illness:  Review of gastrointestinal problems:   1. Gastric ulcer, November 2010 seen on EGD. Presented with an overt melenic GI bleed. Likely NSAID related. H. pylori biopsies negative. Repeat EGD 3 months later was normal.  I recommended he stay on proton pump inhibitor once daily given its history gastric ulcer, permanent need for aspirin and Plavix. 2. routine risk for colon cancer: Colonoscopy by Dr. Ardis Hughs November 2011 small hyperplastic polyp. Internal hemorrhoids, minor diverticulosis. He was recommended to have repeat examination at 10 year interval for screening   HPI: This is a  very pleasant 69 year-old male who is known to Dr. Ardis Hughs for history of gastric ulcer.  Doing well from GI perspective.  Still taking PPI (protonix 40 mg one pill once daily).  No GI complaints.  Needs medication refill.  Just had recent labs earlier this month by PCP.   Current Medications, Allergies, Past Medical History, Past Surgical History, Family History and Social History were reviewed in [employer] record.   Physical Exam: BP 134/70 mmHg  Pulse 56  Ht 5\' 7"  (1.702 m)  Wt 301 lb 2 oz (136.589 kg)  BMI 47.15 kg/m2 General: Well developed male in no acute distress Head: Normocephalic and atraumatic Eyes:  Sclerae anicteric, conjunctiva pink  Ears: Normal auditory acuity Lungs: Clear throughout to auscultation Heart: Slightly bradycardic but regular rhythm Abdomen: Soft, non-distended.  Normal bowel sounds.  Non-tender. Musculoskeletal: Symmetrical with no gross deformities  Extremities: No edema  Neurological: Alert oriented x 4, grossly non-focal Psychological:  Alert and cooperative. Normal mood and affect  Assessment and Recommendations: -69 y.o. male with history of gastric ulcer in November 2010 while on NSAIDs, aspirin, Plavix. He has a continued need for aspirin and Plavix so it has  been recommended that he continue on proton pump inhibitor once daily indefinitely.  Will refill pantoprazole prescription for 90 days supply.  Will obtain recent lab results from his PCP.

## 2014-05-25 NOTE — Progress Notes (Signed)
I agree with the above note, plan 

## 2014-07-19 ENCOUNTER — Ambulatory Visit: Payer: Medicare Other | Admitting: Gastroenterology

## 2014-09-08 ENCOUNTER — Other Ambulatory Visit: Payer: Self-pay | Admitting: Gastroenterology

## 2014-10-25 ENCOUNTER — Other Ambulatory Visit: Payer: Self-pay | Admitting: Cardiovascular Disease

## 2014-11-08 ENCOUNTER — Other Ambulatory Visit: Payer: Self-pay | Admitting: Cardiovascular Disease

## 2015-01-20 ENCOUNTER — Other Ambulatory Visit: Payer: Self-pay | Admitting: Cardiovascular Disease

## 2015-01-23 NOTE — Telephone Encounter (Signed)
Pt sill have not received his Lisinopril,called last week.

## 2015-01-23 NOTE — Telephone Encounter (Signed)
Rx(s) sent to pharmacy electronically.  

## 2015-02-13 ENCOUNTER — Other Ambulatory Visit: Payer: Self-pay | Admitting: Cardiovascular Disease

## 2015-04-10 ENCOUNTER — Encounter: Payer: Self-pay | Admitting: Neurology

## 2015-04-10 ENCOUNTER — Ambulatory Visit (INDEPENDENT_AMBULATORY_CARE_PROVIDER_SITE_OTHER): Payer: Medicare Other | Admitting: Neurology

## 2015-04-10 DIAGNOSIS — I2510 Atherosclerotic heart disease of native coronary artery without angina pectoris: Secondary | ICD-10-CM | POA: Diagnosis not present

## 2015-04-10 DIAGNOSIS — G4730 Sleep apnea, unspecified: Secondary | ICD-10-CM | POA: Diagnosis not present

## 2015-04-10 DIAGNOSIS — G4710 Hypersomnia, unspecified: Secondary | ICD-10-CM | POA: Diagnosis not present

## 2015-04-10 DIAGNOSIS — G4733 Obstructive sleep apnea (adult) (pediatric): Secondary | ICD-10-CM

## 2015-04-10 DIAGNOSIS — E1110 Type 2 diabetes mellitus with ketoacidosis without coma: Secondary | ICD-10-CM

## 2015-04-10 DIAGNOSIS — Z9989 Dependence on other enabling machines and devices: Secondary | ICD-10-CM

## 2015-04-10 DIAGNOSIS — E1310 Other specified diabetes mellitus with ketoacidosis without coma: Secondary | ICD-10-CM

## 2015-04-10 NOTE — Patient Instructions (Signed)

## 2015-04-10 NOTE — Progress Notes (Signed)
SLEEP MEDICINE CLINIC   Provider:  Larey Seat, M D  Referring Provider: Iona Beard, MD Primary Care Physician:  Maggie Font, MD  Chief Complaint  Patient presents with  . New Patient (Initial Visit)    cpap not working properly, cpap is over 72 years old, unable to download, rm 11, alone    HPI:  Jordan [name] is a 70 y.o. male , seen here as a referral from [name] for a new evaluation of his sleep disorder.   Jordan [name], a 70 year old [place] american gentleman, right handed, underwent in 05-13-2007 a sleep study at the [facility].He was diagnosed by [name] with OSA at an AHI of 11.52 and slept only 462.7% of the time. He had also endorsed the Epworth score is only 5 points but the Becks depression inventory at 19 points. He was already a remote ex-smoker and did not consume alcohol at the time this test was obtained. He was snoring moderately during REM sleep his apnea index increased to 56.0 per hour. I am able to review both sleep studies including the CPAP titration study that followed on 07-01-07 the patient now reached an AHI of 0.5 at 11 cm water pressure but it was noted that when he slept supine there was still snoring. It was possible therefore that pressures over 11 cm would be needed to eliminate the supine apnea component and snoring. [name] ordered a CPAP machine with this flex setting at 3 cm water and to follow-up in the sleep clinic was arranged thereafter. In the meantime the patient's machine with no longer serviced and no parts are produced for it. He is using a Respironics model from 2009 and needs urgently to obtain a new CPAP machine. The last time the patient was seen in the sleep lab his body mass index was 46 and he was not yet under medicare. He will need to be re-evaluated to establish a new AHI and the need for conttinous CPAP therapy has to be proven.   Sleep habits are as follows:He uses CPAP every night until last month when his  machine broke, the humidifier broke first . He goes to bed 10.30- 11.30 PM and usually sleeps within 30 minutes or less. The bedroom is cool, quiet and dark. The couple has a sleep number bed that they share. The patient sleeps on his right side or on his back. He does not sleep prone. He uses 3 pillows and likes a firm mattress. He rises twice due to his urge to urinate. He goes back and is asleep within minutes. He will finally raise at about 6 AM and spontaneously, he rises refreshed after CPAP use. The last month without his machine was different for him. He is fatigued, " groggy' and drowsy. Jordan [name] states that he feels usually refreshed in the morning when her having used CPAP and he wakes up witha dry mouth and without headaches. The dry mouth has worsened since his CPAP machine's humidifier broke. He is working outside, gets exposed to day light. He drinks rarely caffeinated beverages, he eats breakfast and regularly lunch, no sodas and no iced tea, drinks  water. He weights still 300 pounds. He swims 4-5 days a week at the [facility]. He never naps, but this last month has been tiresome. He had to keep busy to not nap.   Sleep medical history and family sleep history:  Brother  Had a stroke,  Father died of  CA, snoring and apnea.   Social history:  Not longer smoking.   Review of Systems: Out of a complete 14 system review, the patient complains of only the following symptoms, and all other reviewed systems are negative. Lack of energy, low metabolism, obesity, arthritis. Kneee, shoulder, neck. Diabetic,   Epworth score  9 , Fatigue severity score  49  , depression score 0 ( in PHQ 2) .    Social History   Social History  . Marital Status: Married    Spouse Name: N/A  . Number of Children: 2  . Years of Education: N/A   Occupational History  . retired    Social History Main Topics  . Smoking status: Former Smoker    Quit date: 01/28/1978  . Smokeless tobacco: Never Used  .  Alcohol Use: Yes     Comment: rarely  . Drug Use: No  . Sexual Activity: Not on file   Other Topics Concern  . Not on file   Social History Narrative    Family History  Problem Relation Age of Onset  . Hypertension Father   . Alzheimer's disease Father   . Parkinson's disease Father   . Stroke Brother   . Hypertension Brother   . Alzheimer's disease Mother   . Alzheimer's disease Maternal Grandmother   . Stroke Maternal Grandfather   . Heart disease Maternal Grandfather   . Stroke Paternal Grandmother   . Heart attack Paternal Grandfather     Past Medical History  Diagnosis Date  . GI bleed   . Hypotension     transient  . CAD (coronary artery disease)   . Diabetes mellitus   . Arthritis   . OSA (obstructive sleep apnea)     Severe, on CPAP therapy  . Hypertension   . Colon polyps 01/23/2010    Descending    Past Surgical History  Procedure Laterality Date  . Lower arterial doppler  06/03/2007    No evidence of dissection, AV fistula, or active pseudoaneurysm.  . Cardiac catheterization  05/01/2007    Recommend rotational atherectomy followed by PTCA and probable stenting of LAD.  Marland Kitchen Cardiac catheterization  05/28/2007    Mid LAD hihgh grade 80-90% stenosis, stented with a 3x82mm Cypher stent implanted at 16atm, postdilated with a 3.25x25mm Quantum at 16atm, which revealed excellent wall appoistion - 5.25mm  . Cardiovascular stress test  02/05/2011    Perfusion defect seen in inferior myocardial region consistent with diagphragmatic attenuation. Remaining myocardium demonstrates normal myocardial perfusion with no evidence of ischemia or infarct. ECG is positive for ischemia.  . Transthoracic echocardiogram  03/13/2010    EF [id number], normal diastolic function, mild MR, mild TR, and normal RVSP  . Sleep study  05/13/2007    AHI-11.52/hr, AHI REM-56.0/hr, RDI-11.7/hr, RDI REM-56.0/hr, avg oxygen sat-94%  . Cpap/bipap sleep study  07/01/2007    AHI-0.5/hr at 11cm water  pressure, RDI-12.6/hr    Current Outpatient Prescriptions  Medication Sig Dispense Refill  . acetaminophen (TYLENOL 8 HOUR) 650 MG CR tablet Take 650 mg by mouth every 8 (eight) hours as needed.      Marland Kitchen Apoaequorin (PREVAGEN PO) Take 20 mg by mouth daily.    Marland Kitchen aspirin 81 MG tablet Take 81 mg by mouth daily.      . carvedilol (COREG) 3.125 MG tablet TAKE (1) TABLET BY MOUTH TWICE DAILY WITH MEALS. 180 tablet 0  . Cholecalciferol (VITAMIN D3 PO) Take 2,000 Units by mouth daily.    Marland Kitchen CINNAMON  PO Take 1 tablet by mouth 2 (two) times daily. 1000 mg tablets    . clopidogrel (PLAVIX) 75 MG tablet TAKE ONE TABLET BY MOUTH ONCE DAILY. 90 tablet 1  . fluticasone (CUTIVATE) 0.05 % cream Apply 1 application topically daily.     . fluticasone (FLONASE) 50 MCG/ACT nasal spray Place 1 spray into both nostrils 2 (two) times daily.    Marland Kitchen gabapentin (NEURONTIN) 100 MG capsule Take 100 mg by mouth at bedtime.    . Garlic Oil 123XX123 MG CAPS Take by mouth. 1 time daily      . glucosamine-chondroitin 500-400 MG tablet Take 1 tablet by mouth daily.      Marland Kitchen ketoconazole (NIZORAL) 2 % cream Apply 1 application topically daily.    Marland Kitchen lisinopril (PRINIVIL,ZESTRIL) 5 MG tablet TAKE ONE TABLET BY MOUTH ONCE DAILY. 90 tablet 2  . metFORMIN (GLUCOPHAGE-XR) 500 MG 24 hr tablet Take 500 mg by mouth daily with breakfast.      . Misc Natural Products (TART CHERRY ADVANCED PO) Take 433 mg by mouth daily.    . mupirocin ointment (BACTROBAN) 2 % Place 1 application into the nose 2 (two) times daily.    . NON FORMULARY Beta Prostate. Take 1 capsule by mouth twice daily.    . NON FORMULARY CPAP    . Nutritional Supplements (NUTRITIONAL SUPPLEMENT PO) Take 3 tablets by mouth daily. Pt takes Enhanced Oral Chelation II    . Omega-3 Fatty Acids (FISH OIL) 1000 MG CAPS Take by mouth. 1 by mouth 2 times daily      . orlistat (ALLI) 60 MG capsule Take 60 mg by mouth 2 (two) times daily.    . pantoprazole (PROTONIX) 40 MG tablet TAKE 1  TABLET BY MOUTH DAILY FOR STOMACH. 90 tablet 3  . sildenafil (VIAGRA) 50 MG tablet Take 100 mg by mouth daily as needed.     . simvastatin (ZOCOR) 40 MG tablet TAKE (1) TABLET BY MOUTH AT BEDTIME. 90 tablet 3  . UNABLE TO FIND Take 2 tablets by mouth daily. Pt takes [employer]    . vitamin B-12 (CYANOCOBALAMIN) 500 MCG tablet Take 1,000 mcg by mouth daily.    . vitamin C (ASCORBIC ACID) 500 MG tablet Take 500 mg by mouth 4 (four) times daily.    . vitamin E 400 UNIT capsule Take 400 Units by mouth daily.    Marland Kitchen ZOSTAVAX 29562 UNT/0.65ML injection   0   No current facility-administered medications for this visit.    Allergies as of 04/10/2015 - Review Complete 04/10/2015  Allergen Reaction Noted  . Codeine      Vitals: BP 138/72 mmHg  Pulse 66  Resp 20  Ht 5\' 7"  (1.702 m)  Wt 297 lb (134.718 kg)  BMI 46.51 kg/m2 Last Weight:  Wt Readings from Last 1 Encounters:  04/10/15 297 lb (134.718 kg)   [id number] mass index is 46.51 kg/(m^2).     Last Height:   Ht Readings from Last 1 Encounters:  04/10/15 5\' 7"  (1.702 m)    Physical exam:  General: The patient is awake, alert and appears not in acute distress. The patient is well groomed. Head: Normocephalic, atraumatic. Neck is supple. Mallampati 3  neck circumference:18.5 . Nasal airflow unestricted TMJ is  not evident . Retrognathia is seen.  Cardiovascular:  Regular rate and rhythm , without  murmurs or carotid bruit, and without distended neck veins. Respiratory: Lungs are clear to auscultation. Skin:  Without evidence of edema, or rash  Trunk: BMI is morbidly elevated.   Neurologic exam : The patient is awake and alert, oriented to place and time.   Memory subjective described as intact.    Attention span & concentration ability appears normal.  Speech is fluent,  without dysarthria, dysphonia or aphasia.  Mood and affect are appropriate.  Cranial nerves: Pupils are equal and briskly reactive to light. Funduscopic exam  without  evidence of pallor or edema. Extraocular movements  in vertical and horizontal planes intact and without nystagmus. Visual fields by finger perimetry are intact. Hearing to finger rub intact.   Facial sensation intact to fine touch.  Facial motor strength is symmetric and tongue and uvula move midline. Tongue tremor is noted.  Shoulder shrug was symmetrical. ROM is limited. Right shoulder crepitus   Motor exam:  Normal tone, muscle bulk and symmetric strength in all extremities.  Sensory:  Fine touch, pinprick and vibration were tested in all extremities. Proprioception tested in the upper extremities was normal.  Coordination: Rapid alternating movements in the fingers/hands was normal. Finger-to-nose maneuver  normal without evidence of ataxia, dysmetria or tremor.  Gait and station: Patient walks without assistive device and is able unassisted to climb up to the exam table. Strength within normal limits.  Stance is stable and normal.  Deep tendon reflexes: in the upper and lower extremities are symmetric and intact. Babinski maneuver response is downgoing.  The patient was advised of the nature of the diagnosed sleep disorder , the treatment options and risks for general a health and wellness arising from not treating the condition.  I spent more than 40 minutes of face to face time with the patient. Greater than 50% of time was spent in counseling and coordination of care. We have discussed the diagnosis and differential and I answered the patient's questions.     Assessment:  After physical and neurologic examination, review of laboratory studies,  Personal review of imaging studies, reports of other /same  Imaging studies ,  Results of polysomnography/ neurophysiology testing and pre-existing records as far as provided in visit., my assessment is   1) I strongly suspect that Jordan [name] still has sleep apnea based on his report of how much less well he sleeps since his CPAP has  been nonfunctioning. He still has the same weight that he had 8 years ago when last being tested. He has also the cardiac comorbidities that make it necessary for him to be identified and treated. I would for this reason like him to return for a split night polysomnography as soon as possible that I can provide him with a machine by Medicare guidelines and covered by his insurance. Additional findings and today's physical exam are a mild tremor of the tongue and of the right hand, but no titubation or jaw tremor. He is not hoarse and she does not report dysphagia.  2) I will order a split night polysomnography with [facility] for the diagnostic part. Short against my expectation the patient did not fulfill criteria for split-night polysomnography I will kindly ask the attending technologist to still fit the patient with a new interface. I will recommend a Respironics dream station and would like for Jordan [name] to use not a full face mask but a nasal pillow.   Plan:  Treatment plan and additional workup :  SPLIT and follow up after sleep study.   Asencion Partridge Le Faulcon MD  04/10/2015   CC: Iona Beard, Md [address] [place], [place] [address]

## 2015-04-18 ENCOUNTER — Ambulatory Visit (INDEPENDENT_AMBULATORY_CARE_PROVIDER_SITE_OTHER): Payer: Medicare Other | Admitting: Neurology

## 2015-04-18 DIAGNOSIS — G4710 Hypersomnia, unspecified: Secondary | ICD-10-CM

## 2015-04-18 DIAGNOSIS — G4733 Obstructive sleep apnea (adult) (pediatric): Secondary | ICD-10-CM | POA: Diagnosis not present

## 2015-04-18 DIAGNOSIS — I2510 Atherosclerotic heart disease of native coronary artery without angina pectoris: Secondary | ICD-10-CM

## 2015-04-18 DIAGNOSIS — G4730 Sleep apnea, unspecified: Secondary | ICD-10-CM

## 2015-04-18 DIAGNOSIS — E1110 Type 2 diabetes mellitus with ketoacidosis without coma: Secondary | ICD-10-CM

## 2015-04-18 DIAGNOSIS — Z9989 Dependence on other enabling machines and devices: Secondary | ICD-10-CM

## 2015-04-18 NOTE — Sleep Study (Signed)
Please see the scanned sleep study interpretation located in the Procedure tab within the Chart Review section. 

## 2015-04-20 ENCOUNTER — Encounter: Payer: Self-pay | Admitting: Cardiovascular Disease

## 2015-04-20 ENCOUNTER — Ambulatory Visit (INDEPENDENT_AMBULATORY_CARE_PROVIDER_SITE_OTHER): Payer: Medicare Other | Admitting: Cardiovascular Disease

## 2015-04-20 VITALS — BP 92/62 | HR 54 | Ht 67.0 in | Wt 295.4 lb

## 2015-04-20 DIAGNOSIS — I2510 Atherosclerotic heart disease of native coronary artery without angina pectoris: Secondary | ICD-10-CM

## 2015-04-20 DIAGNOSIS — I2583 Coronary atherosclerosis due to lipid rich plaque: Secondary | ICD-10-CM

## 2015-04-20 DIAGNOSIS — G4733 Obstructive sleep apnea (adult) (pediatric): Secondary | ICD-10-CM

## 2015-04-20 DIAGNOSIS — Z9989 Dependence on other enabling machines and devices: Secondary | ICD-10-CM

## 2015-04-20 DIAGNOSIS — E1159 Type 2 diabetes mellitus with other circulatory complications: Secondary | ICD-10-CM | POA: Diagnosis not present

## 2015-04-20 MED ORDER — LISINOPRIL 5 MG PO TABS: 2.5000 mg | ORAL_TABLET | Freq: Every day | ORAL | Status: DC

## 2015-04-20 NOTE — Progress Notes (Signed)
Patient ID: Jordan [name], male   DOB: March 09, 1945, 70 y.o.   MRN: [id number]   Primary MD: [name]  HPI: Jordan [name] is a 70 y.o. male who presents to the office today for one-year cardiology evaluation.  Jordan [name] is a 68 -year-old African-American American gentleman  Who has a history of CAD , severe obstructive sleep apnea, morbid obesity, type 2 diabetes mellitus, mild essential hypertension, GERD, as well as erectile dysfunction. In April 2009 he was found to have high-grade focally calcified LAD stenosis and underwent successful high-speed rotational atherectomy and DES stenting with insertion of a 3.033 mm Cypher stent.  He has continued to be on long-term antiplatelet therapy with aspirin and Plavix.  He continues to be active.  He denies any recurrent anginal type symptomatology. His nuclear stress test in 2012 continue to suggest patency of this vessel with normal perfusion.  A two-year followup nuclear perfusion study  on 01/28/2013 demonstrated  normal perfusion without scar or ischemia;  EF was 56%.  He has a history of severe  obstructive sleep apnea originally diagnosed in 2009 and he has been on CPAP therapy since that time with 100% compliance.  Recently has been seen began to malfunction. [name] referred him to Dr. Roddie Mc and he underwent a repeat sleep study 2 days ago with plans to get a new CPAP machine.  He has not been very successful with weight loss.  Regarding his morbid obesity.  He has a history of diabetes mellitus  And continues to be on metformin and also had pertussis patent study at [facility].  Recently, he has noticed some mild myalgias of his upper thighs.  He had spoken to a friend and who also is on simvastatin had similar symptomatology. Patient tells me [name] had just checked laboratory.  I do not have these results.  He has a history of erectile dysfunction and has taken Viagra as needed and typically takes this once every 3-4 weeks.   He continues to swim  4-5 days per week, typically doing doggy paddle and swims for half-mile, which takes approximately one hour.Jordan [name]  He denies chest pain, PND orthopnea.  He denies awareness of palpitations.  When he last saw [name].  His blood pressure was low.  He presents for reevaluation.   Past Medical History  Diagnosis Date  . GI bleed   . Hypotension     transient  . CAD (coronary artery disease)   . Diabetes mellitus   . Arthritis   . OSA (obstructive sleep apnea)     Severe, on CPAP therapy  . Hypertension   . Colon polyps 01/23/2010    Descending    Past Surgical History  Procedure Laterality Date  . Lower arterial doppler  06/03/2007    No evidence of dissection, AV fistula, or active pseudoaneurysm.  . Cardiac catheterization  05/01/2007    Recommend rotational atherectomy followed by PTCA and probable stenting of LAD.  Jordan [name] Cardiac catheterization  05/28/2007    Mid LAD hihgh grade 80-90% stenosis, stented with a 3x7m Cypher stent implanted at 16atm, postdilated with a 3.25x233mQuantum at 16atm, which revealed excellent wall appoistion - 5.7968m. Cardiovascular stress test  02/05/2011    Perfusion defect seen in inferior myocardial region consistent with diagphragmatic attenuation. Remaining myocardium demonstrates normal myocardial perfusion with no evidence of ischemia or infarct. ECG is positive for ischemia.  . Transthoracic echocardiogram  03/13/2010    EF >55>94%ormal diastolic function, mild  MR, mild TR, and normal RVSP  . Sleep study  05/13/2007    AHI-11.52/hr, AHI REM-56.0/hr, RDI-11.7/hr, RDI REM-56.0/hr, avg oxygen sat-94%  . Cpap/bipap sleep study  07/01/2007    AHI-0.5/hr at 11cm water pressure, RDI-12.6/hr    Allergies  Allergen Reactions  . Codeine     Current Outpatient Prescriptions  Medication Sig Dispense Refill  . acetaminophen (TYLENOL 8 HOUR) 650 MG CR tablet Take 650 mg by mouth every 8 (eight) hours as needed.      Jordan [name] Apoaequorin (PREVAGEN PO) Take 20 mg by  mouth daily.    Jordan [name] aspirin 81 MG tablet Take 81 mg by mouth daily.      . carvedilol (COREG) 3.125 MG tablet TAKE (1) TABLET BY MOUTH TWICE DAILY WITH MEALS. 180 tablet 0  . Cholecalciferol (VITAMIN D3 PO) Take 2,000 Units by mouth daily.    Jordan [name] CINNAMON PO Take 1 tablet by mouth 2 (two) times daily. 1000 mg tablets    . clopidogrel (PLAVIX) 75 MG tablet TAKE ONE TABLET BY MOUTH ONCE DAILY. 90 tablet 1  . fluticasone (CUTIVATE) 0.05 % cream Apply 1 application topically daily.     . fluticasone (FLONASE) 50 MCG/ACT nasal spray Place 1 spray into both nostrils 2 (two) times daily.    Jordan [name] gabapentin (NEURONTIN) 100 MG capsule Take 100 mg by mouth at bedtime.    . Garlic Oil 3893 MG CAPS Take by mouth. 1 time daily      . glucosamine-chondroitin 500-400 MG tablet Take 2 tablets by mouth daily.     Jordan [name] ketoconazole (NIZORAL) 2 % cream Apply 1 application topically daily.    Jordan [name] lisinopril (PRINIVIL,ZESTRIL) 5 MG tablet Take 0.5 tablets (2.5 mg total) by mouth daily. 90 tablet 2  . metFORMIN (GLUCOPHAGE-XR) 500 MG 24 hr tablet Take 500 mg by mouth daily with breakfast.      . Misc Natural Products (TART CHERRY ADVANCED PO) Take 433 mg by mouth daily.    . mupirocin ointment (BACTROBAN) 2 % Place 1 application into the nose 2 (two) times daily.    . NON FORMULARY Beta Prostate. Take 1 capsule by mouth twice daily.    . NON FORMULARY CPAP    . Nutritional Supplements (NUTRITIONAL SUPPLEMENT PO) Take 3 tablets by mouth daily. Pt takes Enhanced Oral Chelation II    . Omega-3 Fatty Acids (FISH OIL) 1000 MG CAPS Take by mouth. 1 by mouth 2 times daily      . orlistat (ALLI) 60 MG capsule Take 60 mg by mouth 2 (two) times daily.    . pantoprazole (PROTONIX) 40 MG tablet TAKE 1 TABLET BY MOUTH DAILY FOR STOMACH. 90 tablet 3  . sildenafil (VIAGRA) 50 MG tablet Take 100 mg by mouth daily as needed.     . simvastatin (ZOCOR) 40 MG tablet TAKE (1) TABLET BY MOUTH AT BEDTIME. 90 tablet 3  . UNABLE TO FIND Take 2  tablets by mouth daily. Pt takes [employer]    . vitamin B-12 (CYANOCOBALAMIN) 500 MCG tablet Take 1,000 mcg by mouth daily.    . vitamin C (ASCORBIC ACID) 500 MG tablet Take 500 mg by mouth 4 (four) times daily.    . vitamin E 400 UNIT capsule Take 400 Units by mouth daily.    Jordan [name] ZOSTAVAX 73428 UNT/0.65ML injection   0   No current facility-administered medications for this visit.    Social History   Social History  . Marital Status: Married    Spouse  Name: N/A  . Number of Children: 2  . Years of Education: N/A   Occupational History  . retired    Social History Main Topics  . Smoking status: Former Smoker    Quit date: 01/28/1978  . Smokeless tobacco: Never Used  . Alcohol Use: Yes     Comment: rarely  . Drug Use: No  . Sexual Activity: Not on file   Other Topics Concern  . Not on file   Social History Narrative   Socially he is married and has 2 children. No tobacco or alcohol use.  Family History  Problem Relation Age of Onset  . Hypertension Father   . Alzheimer's disease Father   . Parkinson's disease Father   . Stroke Brother   . Hypertension Brother   . Alzheimer's disease Mother   . Alzheimer's disease Maternal Grandmother   . Stroke Maternal Grandfather   . Heart disease Maternal Grandfather   . Stroke Paternal Grandmother   . Heart attack Paternal Grandfather     ROS General: Negative; No fevers, chills, or night sweats; positive for minimal weight loss HEENT: Negative; No changes in vision or hearing, sinus congestion, difficulty swallowing Pulmonary: Negative; No cough, wheezing, shortness of breath, hemoptysis Cardiovascular: Negative; No chest pain, presyncope, syncope, palpitations GI: Negative; No nausea, vomiting, diarrhea, or abdominal pain GU: Positive for erectile dysfunction; No dysuria, hematuria, or difficulty voiding Musculoskeletal: Negative; no myalgias, joint pain, or weakness Hematologic/Oncology: Negative; no easy bruising,  bleeding Endocrine: Negative; no heat/cold intolerance; no diabetes Neuro: Negative; no changes in balance, headaches Skin: Negative; No rashes or skin lesions Psychiatric: Negative; No behavioral problems, depression Sleep:  Positive for severe obstructive sleep apnea.  He recently underwent follow-up sleep study with plans for new CPAP machine Other comprehensive 14 point system review is negative.  PE BP 92/62 mmHg  Pulse 54  Ht '5\' 7"'  (1.702 m)  Wt 295 lb 7 oz (134.01 kg)  BMI 46.26 kg/m2   Repeat blood pressure by me was 100/64  Wt Readings from Last 3 Encounters:  04/20/15 295 lb 7 oz (134.01 kg)  04/10/15 297 lb (134.718 kg)  05/23/14 301 lb 2 oz (136.589 kg)   General: Alert, oriented, no distress; morbidly obese.  Skin: normal turgor, no rashes HEENT: Normocephalic, atraumatic. Pupils round and reactive; sclera anicteric;no lid lag. Extraocular muscles intact. Nose without nasal septal hypertrophy Mouth/Parynx benign; Mallinpatti scale 3 Neck: No JVD, no carotid bruits; normal carotid upstroke Lungs: clear to ausculatation and percussion; no wheezing or rales Chest wall: no tenderness to palpitation Heart: RRR, s1 s2 normal 1/6 sem; no S3 gallop.  No diastolic murmur rubs thrills or heaves Abdomen: Significant central adiposity; soft, nontender; no hepatosplenomehaly, BS+; abdominal aorta nontender and not dilated by palpation. Back: no CVA tenderness Pulses 2+ Extremities: no clubbing cyanosis or edema, Homan's sign negative  Neurologic: grossly nonfocal; cranial nerves grossly normal. Psychologic: normal affect and mood.  ECG (independently read by me):  Sinus bradycardia with occasional PVC.  Heart rate 55 bpm.  QTc interval 369 ms.  February 2016ECG (independently read by me): Sinus bradycardia 50 bpm.  No ectopy.  Normal intervals.   LABS:  BMP Latest Ref Rng 01/25/2009 01/24/2009 01/23/2009  Glucose 70 - 99 mg/dL 95 109(H) 155(H)  BUN 6 - 23 mg/dL 16 39(H)  38(H)  Creatinine 0.4 - 1.5 mg/dL 0.73 0.75 0.91  Sodium 135 - 145 mEq/L 135 138 134(L)  Potassium 3.5 - 5.1 mEq/L 3.5 DELTA CHECK NOTED REPEATED  TO VERIFY 4.3 5.0  Chloride 96 - 112 mEq/L 108 110 103  CO2 19 - 32 mEq/L '25 24 27  ' Calcium 8.4 - 10.5 mg/dL 7.8(L) 8.3(L) 8.7   Hepatic Function Latest Ref Rng 01/23/2009  Total Protein 6.0 - 8.3 g/dL 5.9(L)  Albumin 3.5 - 5.2 g/dL 2.9(L)  AST 0 - 37 U/L 18  ALT 0 - 53 U/L 21  Alk Phosphatase 39 - 117 U/L 48  Total Bilirubin 0.3 - 1.2 mg/dL 0.6  Bilirubin, Direct 0.0 - 0.3 mg/dL 0.1   CBC Latest Ref Rng 03/28/2009 01/26/2009 01/25/2009  WBC 4.5-10.5 10*3/microliter 10.3 10.8(H) 11.8(H)  Hemoglobin 13.0-17.0 g/dL 13.3 8.4(L) 8.6(L)  Hematocrit 39.0-52.0 % 40.9 24.8(L) 25.3(L)  Platelets 150.0-400.0 K/uL 234.0 190 184   Lab Results  Component Value Date   MCV 90.6 03/28/2009   MCV 91.6 01/26/2009   MCV 90.8 01/25/2009   No results found for: TSH   No results found for: HGBA1C  Lipid Panel  No results found for: CHOL, TRIG, HDL, CHOLHDL, VLDL, LDLCALC, LDLDIRECT   RADIOLOGY: No results found.    ASSESSMENT AND PLAN: Mr. Dammon Makarewicz is a 70 year-old African-American male who underwent high-speed rotational artherectomy of the severely calcified focal LAD stenosis with insertion of a 3.0x30 mm DES Cypher stent. Clinically, he is doing well with reference to his coronary obstructive disease without recurrent anginal symptoms. His last nuclear perfusion study  continues to suggest patency of this vessel with normal perfusion.  He is noticed no change in exercise tolerance.  He continues to swim at least 4-5 days per week  for an hour, usually doing the doggie paddle. With reference to his obstructive sleep apnea, and he has been on therapy since 2009.   2 nights ago he underwent a follow-up split study evaluation and he will be getting a new CPAP machine. His blood pressure today is somewhat low on his current regimen consisting of carvedilol  3.125 g twice a day and lisinopril 5 mg daily. I am recommending reduction of lisinopril to 2.5 mg daily.  He will continue on low-dose carvedilol.  His resting heart rate is 55 bpm, and he had occasional unifocal PVC.  He is diabetic on metformin. With his CAD and diabetes mellitus target LDL is less than 70 and he has been on simvastatin 40 mg in addition to omega-3 fatty acids.  He recently had complete blood work by [name].  I will try to obtain these results.  I will add coenzyme Q10 to his medical regimen to see if this improves possible myalgias.  These continue, may be worth considering switching him to a different statin therapy.  His last echo Doppler study was done in 2012.  As long as he remains stable I will see him in one year for reevaluation, and prior to that office visit he will undergo a six-year follow-up echo Doppler assessment to reassess systolic and diastolic function as well as valvular architecture.  Time spent: 25 minutes  Troy Sine, MD, [facility]  04/20/2015 3:17 PM

## 2015-04-20 NOTE — Patient Instructions (Signed)
Your physician has recommended you make the following change in your medication:   1.) cut the lisinopril into 1/2. Take once a day.  2.) Get some Qunol  ( over the counter) to take to help with muscle aches.  Your physician recommends that you schedule a follow-up appointment and echocardiogram in 1 year or sooner if needed.  If you need a refill on your cardiac medications before your next appointment, please call your pharmacy.

## 2015-04-25 ENCOUNTER — Telehealth: Payer: Self-pay

## 2015-04-25 DIAGNOSIS — G4733 Obstructive sleep apnea (adult) (pediatric): Secondary | ICD-10-CM

## 2015-04-25 NOTE — Telephone Encounter (Signed)
Spoke to pt regarding his sleep study results. I advised him that his sleep study revealed osa and that Dr. Brett Fairy recommended starting a cpap. Pt is agreeable. I advised pt to use his cpap at least four or more hours per night. I advised him to avoid sleeping on his back. I advised him to lose weight, diet, and exercise if not contraindicated. I advised him to not drive or operate hazardous machinery when sleepy. Pt verbalized understanding. I will send the order to [facility]. A follow up appt was made for 06/27/15 at 8:30. Pt verbalized understanding.

## 2015-05-01 ENCOUNTER — Other Ambulatory Visit: Payer: Self-pay | Admitting: Cardiovascular Disease

## 2015-05-05 ENCOUNTER — Other Ambulatory Visit: Payer: Self-pay | Admitting: Cardiovascular Disease

## 2015-05-11 ENCOUNTER — Ambulatory Visit (HOSPITAL_COMMUNITY): Payer: Medicare Other | Attending: Cardiology

## 2015-05-11 ENCOUNTER — Other Ambulatory Visit: Payer: Self-pay

## 2015-05-11 DIAGNOSIS — Z87891 Personal history of nicotine dependence: Secondary | ICD-10-CM | POA: Insufficient documentation

## 2015-05-11 DIAGNOSIS — I119 Hypertensive heart disease without heart failure: Secondary | ICD-10-CM | POA: Diagnosis not present

## 2015-05-11 DIAGNOSIS — E119 Type 2 diabetes mellitus without complications: Secondary | ICD-10-CM | POA: Insufficient documentation

## 2015-05-11 DIAGNOSIS — Z8249 Family history of ischemic heart disease and other diseases of the circulatory system: Secondary | ICD-10-CM | POA: Insufficient documentation

## 2015-05-11 DIAGNOSIS — I2510 Atherosclerotic heart disease of native coronary artery without angina pectoris: Secondary | ICD-10-CM | POA: Diagnosis present

## 2015-05-11 DIAGNOSIS — I059 Rheumatic mitral valve disease, unspecified: Secondary | ICD-10-CM | POA: Diagnosis not present

## 2015-05-11 DIAGNOSIS — E669 Obesity, unspecified: Secondary | ICD-10-CM | POA: Insufficient documentation

## 2015-05-11 DIAGNOSIS — Z6841 Body Mass Index (BMI) 40.0 and over, adult: Secondary | ICD-10-CM | POA: Diagnosis not present

## 2015-05-11 DIAGNOSIS — G4733 Obstructive sleep apnea (adult) (pediatric): Secondary | ICD-10-CM | POA: Diagnosis not present

## 2015-05-17 ENCOUNTER — Encounter: Payer: Self-pay | Admitting: *Deleted

## 2015-05-21 ENCOUNTER — Encounter: Payer: Self-pay | Admitting: *Deleted

## 2015-06-14 ENCOUNTER — Telehealth: Payer: Self-pay | Admitting: *Deleted

## 2015-06-14 ENCOUNTER — Other Ambulatory Visit: Payer: Self-pay | Admitting: *Deleted

## 2015-06-14 MED ORDER — PANTOPRAZOLE SODIUM 40 MG PO TBEC: 40.0000 mg | DELAYED_RELEASE_TABLET | Freq: Every day | ORAL | Status: DC

## 2015-06-14 NOTE — Telephone Encounter (Signed)
Received fax from [facility], request for Pantoprazole sodium 40 mg, once daily dosage.  Per Christian Mate RN, [place] for me to send this under Dr. Owens Loffler.

## 2015-06-26 ENCOUNTER — Telehealth: Payer: Self-pay | Admitting: Cardiovascular Disease

## 2015-06-26 DIAGNOSIS — E785 Hyperlipidemia, unspecified: Secondary | ICD-10-CM

## 2015-06-26 DIAGNOSIS — Z79899 Other long term (current) drug therapy: Secondary | ICD-10-CM

## 2015-06-26 NOTE — Telephone Encounter (Signed)
Pt called in wanting to inform Dr. Evette Georges nurse that he discontinued the Simvastatin over a month because he was experiencing muscle pain and trouble walking. Since then he has been feeling much better and does not have any pain. Please f/u with him if he needs to start on another medication.   Thanks

## 2015-06-26 NOTE — Telephone Encounter (Signed)
  Received fax from PCP -- result was for a CK, no lipid or liver fxn test.  Returned call to patient. Rang w/o VM pickup but then call dropped. Attempted to ring again, msg left for pt to call. Unsure if this was sent d/t the way pt's VM is set up.

## 2015-06-26 NOTE — Telephone Encounter (Signed)
Returned call to patient. He states he thinks he has had a repeat lipid test recently by PCP - not sure about the liver fxn test. Knows he had some kind of bloodwork recently to recheck after going off the Simvastatin. States he'd gone off the simva in Feb and started Coq10 2 weeks later at [facility] of primary physician. He will inquire w/ them and have labwork faxed here. i asked to put to my attn.

## 2015-06-26 NOTE — Telephone Encounter (Signed)
Left msg to call.

## 2015-06-26 NOTE — Telephone Encounter (Signed)
Can we have him get lipid/hepatic panel done and then schedule him for lipid visit with pharmacy? Looking at his labs from January he was at goal, but I believe he was still on medication at that point. Thanks!

## 2015-06-27 ENCOUNTER — Encounter: Payer: Self-pay | Admitting: Neurology

## 2015-06-27 ENCOUNTER — Telehealth: Payer: Self-pay | Admitting: Cardiovascular Disease

## 2015-06-27 ENCOUNTER — Ambulatory Visit (INDEPENDENT_AMBULATORY_CARE_PROVIDER_SITE_OTHER): Payer: Medicare Other | Admitting: Neurology

## 2015-06-27 DIAGNOSIS — G4733 Obstructive sleep apnea (adult) (pediatric): Secondary | ICD-10-CM | POA: Diagnosis not present

## 2015-06-27 DIAGNOSIS — Z9989 Dependence on other enabling machines and devices: Secondary | ICD-10-CM

## 2015-06-27 NOTE — Telephone Encounter (Signed)
New message   The patient was returning the call on cell [phone], Ovid Curd called and spoke with pt's wife earlier today. The pt will be out of house later today he works out, the pt has asked for Ovid Curd to call back tomorrow morning.

## 2015-06-27 NOTE — Telephone Encounter (Signed)
Acknowledged - I will be out of office tomorrow so will have covering RN f/u. See other OV note from 06/26/15. Pt just needs an appt w/ lipid clinic, if he is agreeable to this. I wanted to schedule on phone w/ patient for time/date that would work for him as he keeps an active schedule.

## 2015-06-27 NOTE — Progress Notes (Addendum)
SLEEP MEDICINE CLINIC   Provider:  Larey Seat, M D  Referring Provider: Iona Beard, MD Primary Care Physician:  Jordan Font, MD  Chief Complaint  Jordan [name] presents with  . Follow-up    new cpap user, going well, rm 11, alone    HPI:  Jordan Jordan [name] is a 70 y.o. male , seen here as a referral from Dr. Berdine [name] for a new evaluation of his sleep disorder.   Jordan Jordan [name], a 70 year old afro-american gentleman, right handed, underwent in 05-13-2007 a sleep study at [name]. He was diagnosed by Jordan [name] with OSA at an AHI of 11.52 and slept only 462.7% of Jordan time. He had also endorsed Jordan Epworth score is only 5 points but Jordan Becks depression inventory at 19 points. He was already a remote ex-smoker and did not consume alcohol at Jordan time this test was obtained. He was snoring moderately during REM sleep his apnea index increased to 56.0 per hour. I am able to review both sleep studies including Jordan CPAP titration study that followed on 07-01-07 Jordan Jordan [name] now reached an AHI of 0.5 at 11 cm water pressure but it was noted that when he slept supine there was still snoring. It was possible therefore that pressures over 11 cm would be needed to eliminate Jordan supine apnea component and snoring. Jordan Jordan [name] ordered a CPAP machine with this flex setting at 3 cm water and to follow-up in Jordan sleep clinic was arranged thereafter. In Jordan meantime Jordan Jordan [name]'s machine with no longer serviced and no parts are produced for it. He is using a Jordan Jordan [name] model from 2009 and needs urgently to obtain a new CPAP machine. Jordan last time Jordan Jordan [name] was seen in Jordan sleep lab his body mass index was 46 and he was not yet under medicare. He will need to be re-evaluated to establish a new AHI and Jordan need for conttinous CPAP therapy has to be proven.   Sleep habits are as follows:He uses CPAP every night until last month when his machine broke, Jordan humidifier broke first . He goes to bed 10.30-  11.30 PM and usually sleeps within 30 minutes or less. Jordan bedroom is cool, quiet and dark. Jordan couple has a sleep number bed that they share. Jordan Jordan [name] sleeps on his right side or on his back. He does not sleep prone. He uses 3 pillows and likes a firm mattress. He rises twice due to his urge to urinate. He goes back and is asleep within minutes. He will finally raise at about 6 AM and spontaneously, he rises refreshed after CPAP use. Jordan last month without his machine was different for him. He is fatigued, " groggy' and drowsy. Jordan Jordan [name] states that he feels usually refreshed in Jordan morning when her having used CPAP and he wakes up witha dry mouth and without headaches. Jordan dry mouth has worsened since his CPAP machine's humidifier broke. He is working outside, gets exposed to day light. He drinks rarely caffeinated beverages, he eats breakfast and regularly lunch, no sodas and no iced tea, drinks  water. He weights still 300 pounds. He swims 4-5 days a week at [facility]. He never naps, but this last month has been tiresome. He had to keep busy to not nap.    Interval history from 06/27/2015 I had Jordan pleasure of seeing Jordan Jordan [name] today following his sleep study from Jordan 21st of every 2017. His sleep study  became a split-night protocol after his AHI reached 25.7 in supine position his AHI was 45. REM sleep was not seen in Jordan diagnostic part of Jordan study. He was then titrated to CPAP beginning at 5 cm water pressure with stepwise titration to a pressure of 11 cm. He seemed to do Jordan very best at 9 cm water pressure. He was using an Jordan [name] nasal mask we also offered him a full face mask Jordan [name]. He prefers a nasal mask. Snoring was treated apnea was much reduced there were no periodic limb movements of sleep noted lowest oxygen saturation was 88% prior to CPAP use was on 12.2 minutes of desaturation.  Jordan Jordan [name] is now using CPAP is using an ace flex machine dream station average user time is 7 hours and  1 minute 100% compliance over Jordan last 30 days and 100% over 4 hours of daily use. His machine averages and 90 percentile pressure of 8.3 cm. Average AHI is 5.4 which is satisfactory. Auto set 5-15 with 3 cm EPR.  He reports having no trouble using CPAP and Jordan humidifier. He likes that it is quiet.   Sleep medical history and family sleep history:  Brother had a stroke,  Father died of CA, snoring and apnea.  Social history:  Not longer smoking.  No ETOH, Caffeine : decaffeinated coffee, no Sodas and no tea. 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 All suffered from Alzheimer's.    Review of Systems: Out of a complete 14 system review, Jordan Jordan [name] complains of only Jordan following symptoms, and all other reviewed systems are negative. Lack of energy, low metabolism, obesity, arthritis. Kneee, shoulder, neck. Diabetic, vertigo.   Epworth score  9 , Fatigue severity score  49  , depression score 0 ( in PHQ 2) .    Social History   Social History  . Marital Status: Married    Spouse Name: N/A  . Number of Children: 2  . Years of Education: N/A   Occupational History  . retired    Social History Main Topics  . Smoking status: Former Smoker    Quit date: 01/28/1978  . Smokeless tobacco: Never Used  . Alcohol Use: Yes     Comment: rarely  . Drug Use: No  . Sexual Activity: Not on file   Other Topics Concern  . Not on file   Social History Narrative    Family History  Problem Relation Age of Onset  . Hypertension Father   . Alzheimer's disease Father   . Parkinson's disease Father   . Stroke Brother   . Hypertension Brother   . Alzheimer's disease Mother   . Alzheimer's disease Maternal Grandmother   . Stroke Maternal Grandfather   . Heart disease Maternal Grandfather   . Stroke Paternal Grandmother   . Heart attack Paternal Grandfather     Past Medical History  Diagnosis Date  . GI bleed   .  Hypotension     transient  . CAD (coronary artery disease)   . Diabetes mellitus   . Arthritis   . OSA (obstructive sleep apnea)     Severe, on CPAP therapy  . Hypertension   . Colon polyps 01/23/2010    Descending    Past Surgical History  Procedure Laterality Date  . Lower arterial doppler  06/03/2007    No evidence of dissection, AV fistula, or active pseudoaneurysm.  . Cardiac catheterization  05/01/2007  Recommend rotational atherectomy followed by PTCA and probable stenting of LAD.  Marland Kitchen Cardiac catheterization  05/28/2007    Mid LAD hihgh grade 80-90% stenosis, stented with a 3x40mm Cypher stent implanted at 16atm, postdilated with a 3.25x57mm Quantum at 16atm, which revealed excellent wall appoistion - 5.53mm  . Cardiovascular stress test  02/05/2011    Perfusion defect seen in inferior myocardial region consistent with diagphragmatic attenuation. Remaining myocardium demonstrates normal myocardial perfusion with no evidence of ischemia or infarct. ECG is positive for ischemia.  . Transthoracic echocardiogram  03/13/2010    EF [id number], normal diastolic function, mild MR, mild TR, and normal RVSP  . Sleep study  05/13/2007    AHI-11.52/hr, AHI REM-56.0/hr, RDI-11.7/hr, RDI REM-56.0/hr, avg oxygen sat-94%  . Cpap/bipap sleep study  07/01/2007    AHI-0.5/hr at 11cm water pressure, RDI-12.6/hr    Current Outpatient Prescriptions  Medication Sig Dispense Refill  . acetaminophen (TYLENOL 8 HOUR) 650 MG CR tablet Take 650 mg by mouth every 8 (eight) hours as needed.      Marland Kitchen Apoaequorin (PREVAGEN PO) Take 20 mg by mouth daily.    Marland Kitchen aspirin 81 MG tablet Take 81 mg by mouth daily.      . carvedilol (COREG) 3.125 MG tablet TAKE (1) TABLET BY MOUTH TWICE DAILY WITH MEALS. 180 tablet 0  . Cholecalciferol (VITAMIN D3 PO) Take 2,000 Units by mouth daily.    Marland Kitchen CINNAMON PO Take 1 tablet by mouth 2 (two) times daily. 1000 mg tablets    . clopidogrel (PLAVIX) 75 MG tablet TAKE ONE TABLET BY MOUTH ONCE  DAILY. 90 tablet 3  . fluticasone (CUTIVATE) 0.05 % cream Apply 1 application topically daily.     . fluticasone (FLONASE) 50 MCG/ACT nasal spray Place 1 spray into both nostrils 2 (two) times daily.    . Garlic Oil 123XX123 MG CAPS Take by mouth. 1 time daily      . glucosamine-chondroitin 500-400 MG tablet Take 2 tablets by mouth daily.     Marland Kitchen ketoconazole (NIZORAL) 2 % cream Apply 1 application topically daily.    Marland Kitchen lisinopril (PRINIVIL,ZESTRIL) 5 MG tablet Take 0.5 tablets (2.5 mg total) by mouth daily. 90 tablet 2  . metFORMIN (GLUCOPHAGE-XR) 500 MG 24 hr tablet Take 500 mg by mouth daily with breakfast.      . Misc Natural Products (TART CHERRY ADVANCED PO) Take 433 mg by mouth daily.    . mupirocin ointment (BACTROBAN) 2 % Place 1 application into Jordan nose 2 (two) times daily.    . NON FORMULARY Beta Prostate. Take 1 capsule by mouth twice daily.    . NON FORMULARY CPAP    . Nutritional Supplements (NUTRITIONAL SUPPLEMENT PO) Take 3 tablets by mouth daily. Pt takes Enhanced Oral Chelation II    . Omega-3 Fatty Acids (FISH OIL) 1000 MG CAPS Take by mouth. 1 by mouth 2 times daily      . orlistat (ALLI) 60 MG capsule Take 60 mg by mouth 2 (two) times daily.    . pantoprazole (PROTONIX) 40 MG tablet Take 1 tablet (40 mg total) by mouth daily. 90 tablet 3  . sildenafil (VIAGRA) 50 MG tablet Take 100 mg by mouth daily as needed.     Marland Kitchen UNABLE TO FIND Take 2 tablets by mouth daily. Pt takes [employer]    . vitamin B-12 (CYANOCOBALAMIN) 500 MCG tablet Take 1,000 mcg by mouth daily.    . vitamin C (ASCORBIC ACID) 500 MG tablet Take 1,000 mg  by mouth 2 (two) times daily.     . vitamin E 400 UNIT capsule Take 400 Units by mouth daily.    Marland Kitchen ZOSTAVAX 29562 UNT/0.65ML injection   0   No current facility-administered medications for this visit.    Allergies as of 06/27/2015 - Review Complete 06/27/2015  Allergen Reaction Noted  . Codeine      Vitals: BP 132/78 mmHg  Pulse 66  Resp 20  Ht  5\' 7"  (1.702 m)  Wt 296 lb (134.265 kg)  BMI 46.35 kg/m2 Last Weight:  Wt Readings from Last 1 Encounters:  06/27/15 296 lb (134.265 kg)   [id number] mass index is 46.35 kg/(m^2).     Last Height:   Ht Readings from Last 1 Encounters:  06/27/15 5\' 7"  (1.702 m)    Physical exam:  General: Jordan Jordan [name] is awake, alert and appears not in acute distress. Jordan Jordan [name] is well groomed. Head: Normocephalic, atraumatic. Neck is supple. Mallampati 3  neck circumference:18.5 . Nasal airflow unestricted TMJ is  not evident . Retrognathia is seen.  Cardiovascular:  Regular rate and rhythm , without  murmurs or carotid bruit, and without distended neck veins. Respiratory: Lungs are clear to auscultation. Skin:  Without evidence of edema, or rash Trunk: BMI is morbidly elevated.   Neurologic exam : Jordan Jordan [name] is awake and alert, oriented to place and time.   Memory subjective described as intact. Attention span & concentration ability appears normal.  Speech is fluent without dysarthria, dysphonia or aphasia.  Mood and affect are appropriate.  Cranial nerves: Pupils are equal and briskly reactive to light. Extraocular movements  in vertical and horizontal planes intact and without nystagmus. Visual fields by finger perimetry are intact. Hearing to finger rub intact.   Facial sensation intact to fine touch.  Facial motor strength is symmetric and tongue and uvula move midline. Tongue tremor is noted.  Shoulder shrug was symmetrical. ROM is limited. Right shoulder crepitus   Motor exam:  Normal tone, muscle bulk and symmetric strength in all extremities.   Jordan Jordan [name] was advised of Jordan nature of Jordan diagnosed sleep disorder , Jordan treatment options and risks for general a health and wellness arising from not treating Jordan condition.  I spent more than 25 minutes of face to face time with Jordan Jordan [name]. Greater than 50% of time was spent in counseling and coordination of care. We have discussed Jordan  diagnosis and differential and I answered Jordan Jordan [name]'s questions.   Coordination of care between DME, Jordan [name] and cardiologist. Downloads and sleep study data forwarded.    Assessment:  After physical and neurologic examination, review of laboratory studies,  Personal review of imaging studies, reports of other /same  Imaging studies ,  Results of polysomnography/ neurophysiology testing and pre-existing records as far as provided in visit., my assessment is   Moderate -severe OSA, treated with CPAP. Highly compliant Jordan [name]. Epworth now still at 9 points.  Plan:  Treatment plan and additional workup : continue CPAP at autoset with 3 cm EPR. 100% compliance !  Rv in 12 month .   Asencion Partridge Brandolyn Shortridge MD  06/27/2015   CC: Jordan Beard, Md [address] [place], [place] [address]

## 2015-06-27 NOTE — Telephone Encounter (Signed)
Called pt back - he was not available, spoke to wife Erma - informed of recommendations.  She will have patient call to schedule the pharmD appt.  Aware lipid and liver fxn test ordered and pt can go to [facility] in [place] for this test. Aware this is fasting test.

## 2015-06-28 ENCOUNTER — Telehealth: Payer: Self-pay | Admitting: Cardiovascular Disease

## 2015-06-28 DIAGNOSIS — E785 Hyperlipidemia, unspecified: Secondary | ICD-10-CM

## 2015-06-28 DIAGNOSIS — Z79899 Other long term (current) drug therapy: Secondary | ICD-10-CM

## 2015-06-28 NOTE — Telephone Encounter (Signed)
Follow-up   Returning a phone call for the nurse from yesterday, to call pt cell phone number provided; about his lab work

## 2015-06-28 NOTE — Telephone Encounter (Addendum)
Spoke with pt, he reports he had lab work done in march or April with his primary care doctor while he was off the simvastatin. He does not what to have labs repeat and is quite frustrated with his care. i explained to the pt the lastest lab work we have is from 02/2015. Call placed to dr hill's office requesting the most recent labs be faxed for dr [facility] review.  Pt agreed with this plan.

## 2015-06-29 NOTE — Telephone Encounter (Signed)
Returned call to patient. Discussed w/ him that a fax was received from PCP office but was for creatine kinase test (normal), we would still need lipid study done along w/ hep fxn. Pt notes he was put on statins preventatively several years ago when he had his stent but is unaware that he's really ever had any issue w/ his cholesterol. He wants to wait on scheduling a lipid clinic visit - I advised this is reasonable and I would just get confirmation of recommendations based on results. Pt aware if he goes in tomorrow we would have results some time early next week. Will keep this encounter open until results read by physician.

## 2015-06-29 NOTE — Telephone Encounter (Signed)
New Message  Pt called states that he is returning the call to discuss having the labs completed in [place]. Pt request a call back on the mobile line

## 2015-07-01 LAB — LIPID PANEL
CHOLESTEROL: 153 mg/dL (ref 125–200)
HDL: 45 mg/dL (ref >=40)
LDL Cholesterol: 92 mg/dL (ref <130)
TRIGLYCERIDES: 80 mg/dL (ref <150)
Total CHOL/HDL Ratio: 3.4 Ratio (ref <=5.0)
VLDL: 16 mg/dL (ref <30)

## 2015-07-01 LAB — HEPATIC FUNCTION PANEL
ALBUMIN: 3.9 g/dL (ref 3.6–5.1)
ALT: 14 U/L (ref 9–46)
AST: 13 U/L (ref 10–35)
Alkaline Phosphatase: 69 U/L (ref 40–115)
BILIRUBIN TOTAL: 0.5 mg/dL (ref 0.2–1.2)
Bilirubin, Direct: 0.1 mg/dL (ref <=0.2)
Indirect Bilirubin: 0.4 mg/dL (ref 0.2–1.2)
TOTAL PROTEIN: 6.9 g/dL (ref 6.1–8.1)

## 2015-07-03 ENCOUNTER — Encounter: Payer: Self-pay | Admitting: *Deleted

## 2015-07-03 MED ORDER — ROSUVASTATIN CALCIUM 5 MG PO TABS: 5.0000 mg | ORAL_TABLET | ORAL | Status: DC

## 2015-07-03 MED ORDER — ROSUVASTATIN CALCIUM 5 MG PO TABS: 10.0000 mg | ORAL_TABLET | ORAL | Status: DC

## 2015-07-03 NOTE — Telephone Encounter (Signed)
Recommendations given to patient, Rx sent to pharmacy, pt had all questions addressed to his satisfaction.

## 2015-07-03 NOTE — Telephone Encounter (Signed)
Results of recent lipid and liver available in epic. He has been off statins x~3 months now. Needs recommendation if he needs to be on a statin or not, no hx of hyperlipidemia per pt, also intolerant to simva and artorvastatin.  Will request review by clinical pharmacist.

## 2015-07-03 NOTE — Telephone Encounter (Signed)
Patient had DES to LAD, so LDL goal is < 70.  If he didn't tolerate simva or atorva would suggest that he try rosuvastatin 5 mg twice weekly.  He doesn't have far to go in LDL reduction (he's at 98), so twice weekly more likely to get him to goal without side effects.

## 2015-08-02 ENCOUNTER — Telehealth: Payer: Self-pay | Admitting: Cardiovascular Disease

## 2015-08-02 NOTE — Telephone Encounter (Signed)
Returned call to patient regarding chest discomfort he has been having.  Pt identifies chest pain he describes as "off and on soreness" for past month. He states the pain is mild.  Denies SOB, dizziness, fatigue, pain in arms, jaw, or neck.  His pain he reports is sometimes on right upper chest, or left upper chest and seems to "travel back and forth". Also reports he has had sometimes in his back.  He thinks soreness is due to him having a fall and hitting his chest wall 1.5 months ago. He denies unusual bruising or sharp pain. States also that he swims 4-5 times a week. We discussed that continued pain could be MSK. He reports improvement w/ topical cream (arthritis rub), manual palpation or massage. He has not taken NTG for this.  Pt is not concerned for this being cardiac in nature. Pt wanted to communicate his symptoms "just so [name] is aware."   Pt aware of recommended interventions/emergent evaluation if SOB or other symptoms. Pt wants to know if he can get CXR. Aware I will route for recommendations from provider. Routed to Dr. Gwenlyn Found ([facility]).

## 2015-08-02 NOTE — Telephone Encounter (Signed)
If still having chest pain can be seen by a mid-level provider in the flexed clinic tomorrow

## 2015-08-02 NOTE — Telephone Encounter (Signed)
Pt c/o of Chest Pain: STAT if CP now or developed within 24 hours  1. Are you having CP right now? no  2. Are you experiencing any other symptoms (ex. SOB, nausea, vomiting, sweating)? no  3. How long have you been experiencing CP? 3-4 weeks  4. Is your CP continuous or coming and going? Comes/goes  5. Have you taken Nitroglycerin? No  ?

## 2015-08-03 NOTE — Telephone Encounter (Signed)
Called patient and advised on Dr. Kennon Holter recommendations. OV scheduled for patient at his preference (to see [name] for opening on Monday). Pt aware to go to ED if emergent symptoms.

## 2015-08-03 NOTE — Telephone Encounter (Signed)
Left msg to call.

## 2015-08-07 ENCOUNTER — Ambulatory Visit (INDEPENDENT_AMBULATORY_CARE_PROVIDER_SITE_OTHER): Payer: Medicare Other | Admitting: Cardiovascular Disease

## 2015-08-07 ENCOUNTER — Encounter: Payer: Self-pay | Admitting: Cardiovascular Disease

## 2015-08-07 VITALS — BP 120/70 | HR 50 | Ht 67.0 in | Wt 296.0 lb

## 2015-08-07 DIAGNOSIS — I2510 Atherosclerotic heart disease of native coronary artery without angina pectoris: Secondary | ICD-10-CM | POA: Diagnosis not present

## 2015-08-07 DIAGNOSIS — G4733 Obstructive sleep apnea (adult) (pediatric): Secondary | ICD-10-CM

## 2015-08-07 DIAGNOSIS — I2583 Coronary atherosclerosis due to lipid rich plaque: Secondary | ICD-10-CM

## 2015-08-07 DIAGNOSIS — E785 Hyperlipidemia, unspecified: Secondary | ICD-10-CM

## 2015-08-07 DIAGNOSIS — Z9989 Dependence on other enabling machines and devices: Secondary | ICD-10-CM

## 2015-08-07 MED ORDER — ROSUVASTATIN CALCIUM 5 MG PO TABS: ORAL_TABLET | ORAL | Status: DC

## 2015-08-07 NOTE — Patient Instructions (Signed)
Your physician has recommended you make the following change in your medication:   The rouvastatin has been increased. Take as directed on the bottle.  Your physician recommends that you return for lab work and office visit in 3 months.

## 2015-08-08 ENCOUNTER — Encounter: Payer: Self-pay | Admitting: Cardiovascular Disease

## 2015-08-08 NOTE — Progress Notes (Signed)
Patient ID: Jordan [name], male   DOB: [birth year], 70 y.o.   MRN: [id number]   Primary MD: [name]  HPI: Jordan [name] is a 70 y.o. male who presents to the office today for a 4 month cardiology evaluation.  Jordan [name] is a 82 -year-old African-American American gentleman who has a history of CAD , severe obstructive sleep apnea, morbid obesity, type 2 diabetes mellitus, mild essential hypertension, GERD, as well as erectile dysfunction. In April 2009 he was found to have high-grade focally calcified LAD stenosis and underwent successful high-speed rotational atherectomy and DES stenting with insertion of a 3.033 mm Cypher stent.  He has continued to be on long-term antiplatelet therapy with aspirin and Plavix.  He continues to be active.  He denies any recurrent anginal type symptomatology. His nuclear stress test in 2012 continue to suggest patency of this vessel with normal perfusion.  A two-year followup nuclear perfusion study  on 01/28/2013 demonstrated  normal perfusion without scar or ischemia;  EF was 56%.  He has a history of severe  obstructive sleep apnea originally diagnosed in 2009 and he has been on CPAP therapy since that time with 100% compliance.  He recently received a new CPAP machine after his old machine had begun to malfunction.  He was recently placed on a nasal mask and is followed by Dr.Domeiher.  He has a history of hyperlipidemia and when last seen had begun to have issues with simvastatin causing some myalgias.  I suggest that he change this to Crestor and he is now been on a very low-dose at 5 mg and has been taking this 2 times per week.  He has tolerated this.  Repeat blood work in May 2017 showed a total cholesterol of 153, triglycerides 80, HDL 45, and LDL 92.  Since I last saw him, he underwent an echo Doppler study on 05/11/2015.  This showed an ejection fraction at 55%..Have regional wall motion abnormalities but had grade 2 diastolic dysfunction.  There was mitral  annular calcification without regurgitation.  He normal pulmonic pressures.  His aortic valve was normal.  He has  a history of morbid obesity, but  not been very successful with weight loss.    He has a history of diabetes mellitus  And continues to be on metformin and also had pertussis patent study at [facility].  He has a history of erectile dysfunction and has taken Viagra as needed and typically takes this once every 3-4 weeks.  He continues to swim 4-5 days per week, typically doing doggy paddle and swims for half-mile, which takes approximately one hour.  He denies chest pain, PND orthopnea.  He denies awareness of palpitations.  When he last saw [name].  His blood pressure was low.  He presents for reevaluation.   Past Medical History  Diagnosis Date  . GI bleed   . Hypotension     transient  . CAD (coronary artery disease)   . Diabetes mellitus   . Arthritis   . OSA (obstructive sleep apnea)     Severe, on CPAP therapy  . Hypertension   . Colon polyps 01/23/2010    Descending    Past Surgical History  Procedure Laterality Date  . Lower arterial doppler  06/03/2007    No evidence of dissection, AV fistula, or active pseudoaneurysm.  . Cardiac catheterization  05/01/2007    Recommend rotational atherectomy followed by PTCA and probable stenting of LAD.  Marland Kitchen Cardiac catheterization  05/28/2007  Mid LAD hihgh grade 80-90% stenosis, stented with a 3x76m Cypher stent implanted at 16atm, postdilated with a 3.25x29mQuantum at 16atm, which revealed excellent wall appoistion - 5.7938m. Cardiovascular stress test  02/05/2011    Perfusion defect seen in inferior myocardial region consistent with diagphragmatic attenuation. Remaining myocardium demonstrates normal myocardial perfusion with no evidence of ischemia or infarct. ECG is positive for ischemia.  . Transthoracic echocardiogram  03/13/2010    EF >55>70%ormal diastolic function, mild MR, mild TR, and normal RVSP  . Sleep study   05/13/2007    AHI-11.52/hr, AHI REM-56.0/hr, RDI-11.7/hr, RDI REM-56.0/hr, avg oxygen sat-94%  . Cpap/bipap sleep study  07/01/2007    AHI-0.5/hr at 11cm water pressure, RDI-12.6/hr    Allergies  Allergen Reactions  . Codeine     Current Outpatient Prescriptions  Medication Sig Dispense Refill  . acetaminophen (TYLENOL 8 HOUR) 650 MG CR tablet Take 650 mg by mouth every 8 (eight) hours as needed.      . AMarland Kitchenoaequorin (PREVAGEN PO) Take 20 mg by mouth daily.    . aMarland Kitchenpirin 81 MG tablet Take 81 mg by mouth daily.      . carvedilol (COREG) 3.125 MG tablet TAKE (1) TABLET BY MOUTH TWICE DAILY WITH MEALS. 180 tablet 0  . Cholecalciferol (VITAMIN D3 PO) Take 2,000 Units by mouth daily.    . CMarland KitchenNNAMON PO Take 1 tablet by mouth 2 (two) times daily. 1000 mg tablets    . clopidogrel (PLAVIX) 75 MG tablet TAKE ONE TABLET BY MOUTH ONCE DAILY. 90 tablet 3  . fluticasone (CUTIVATE) 0.05 % cream Apply 1 application topically daily.     . fluticasone (FLONASE) 50 MCG/ACT nasal spray Place 1 spray into both nostrils 2 (two) times daily.    . Garlic Oil [id number] CAPS Take by mouth. 1 time daily      . glucosamine-chondroitin 500-400 MG tablet Take 2 tablets by mouth daily.     . kMarland Kitchentoconazole (NIZORAL) 2 % cream Apply 1 application topically daily.    . lMarland Kitchensinopril (PRINIVIL,ZESTRIL) 5 MG tablet Take 0.5 tablets (2.5 mg total) by mouth daily. 90 tablet 2  . metFORMIN (GLUCOPHAGE-XR) 500 MG 24 hr tablet Take 500 mg by mouth daily with breakfast.      . Misc Natural Products (TART CHERRY ADVANCED PO) Take 433 mg by mouth daily.    . mupirocin ointment (BACTROBAN) 2 % Place 1 application into the nose 2 (two) times daily.    . NON FORMULARY Beta Prostate. Take 1 capsule by mouth twice daily.    . NON FORMULARY CPAP    . Nutritional Supplements (NUTRITIONAL SUPPLEMENT PO) Take 3 tablets by mouth daily. Pt takes Enhanced Oral Chelation II    . Omega-3 Fatty Acids (FISH OIL) 1000 MG CAPS Take by mouth. 1 by mouth 2  times daily      . orlistat (ALLI) 60 MG capsule Take 60 mg by mouth 2 (two) times daily.    . pantoprazole (PROTONIX) 40 MG tablet Take 1 tablet (40 mg total) by mouth daily. 90 tablet 3  . rosuvastatin (CRESTOR) 5 MG tablet 1 tablet 3 times/ week. If tolerated then increase to every other day. 30 tablet 5  . sildenafil (VIAGRA) 50 MG tablet Take 100 mg by mouth daily as needed.     . UMarland KitchenABLE TO FIND Take 2 tablets by mouth daily. Pt takes [employer]    . vitamin B-12 (CYANOCOBALAMIN) 500 MCG tablet Take 1,000 mcg by mouth  daily.    . vitamin C (ASCORBIC ACID) 500 MG tablet Take 1,000 mg by mouth 2 (two) times daily.     . vitamin E 400 UNIT capsule Take 400 Units by mouth daily.    Marland Kitchen ZOSTAVAX 09381 UNT/0.65ML injection   0   No current facility-administered medications for this visit.    Social History   Social History  . Marital Status: Married    Spouse Name: N/A  . Number of Children: 2  . Years of Education: N/A   Occupational History  . retired    Social History Main Topics  . Smoking status: Former Smoker    Quit date: 01/28/1978  . Smokeless tobacco: Never Used  . Alcohol Use: Yes     Comment: rarely  . Drug Use: No  . Sexual Activity: Not on file   Other Topics Concern  . Not on file   Social History Narrative   Socially he is married and has 2 children. No tobacco or alcohol use.  Family History  Problem Relation Age of Onset  . Hypertension Father   . Alzheimer's disease Father   . Parkinson's disease Father   . Stroke Brother   . Hypertension Brother   . Alzheimer's disease Mother   . Alzheimer's disease Maternal Grandmother   . Stroke Maternal Grandfather   . Heart disease Maternal Grandfather   . Stroke Paternal Grandmother   . Heart attack Paternal Grandfather     ROS General: Negative; No fevers, chills, or night sweats; positive for minimal weight loss HEENT: Negative; No changes in vision or hearing, sinus congestion, difficulty  swallowing Pulmonary: Negative; No cough, wheezing, shortness of breath, hemoptysis Cardiovascular: Negative; No chest pain, presyncope, syncope, palpitations GI: Negative; No nausea, vomiting, diarrhea, or abdominal pain GU: Positive for erectile dysfunction; No dysuria, hematuria, or difficulty voiding Musculoskeletal: Negative; no myalgias, joint pain, or weakness Hematologic/Oncology: Negative; no easy bruising, bleeding Endocrine: Negative; no heat/cold intolerance; no diabetes Neuro: Negative; no changes in balance, headaches Skin: Negative; No rashes or skin lesions Psychiatric: Negative; No behavioral problems, depression Sleep:  Positive for severe obstructive sleep apnea.  He recently underwent follow-up sleep study with plans for new CPAP machine Other comprehensive 14 point system review is negative.  PE BP 120/70 mmHg  Pulse 50  Ht _0  (1.702 m)  Wt 296 lb (134.265 kg)  BMI 46.35 kg/m2   Repeat blood pressure by me was 124/70  Wt Readings from Last 3 Encounters:  08/07/15 296 lb (134.265 kg)  06/27/15 296 lb (134.265 kg)  04/20/15 295 lb 7 oz (134.01 kg)   General: Alert, oriented, no distress; morbidly obese.  Skin: normal turgor, no rashes HEENT: Normocephalic, atraumatic. Pupils round and reactive; sclera anicteric;no lid lag. Extraocular muscles intact. Nose without nasal septal hypertrophy Mouth/Parynx benign; Mallinpatti scale 3 Neck: No JVD, no carotid bruits; normal carotid upstroke Lungs: clear to ausculatation and percussion; no wheezing or rales Chest wall: no tenderness to palpitation Heart: RRR, s1 s2 normal 1/6 sem; no S3 gallop.  No diastolic murmur rubs thrills or heaves Abdomen: Significant central adiposity; soft, nontender; no hepatosplenomehaly, BS+; abdominal aorta nontender and not dilated by palpation. Back: no CVA tenderness Pulses 2+ Extremities: no clubbing cyanosis or edema, Homan's sign negative  Neurologic: grossly nonfocal; cranial  nerves grossly normal. Psychologic: normal affect and mood.  ECG (independently read by me): Sinus bradycardia 50 bpm.  No ectopy.  ECG (independently read by me):  Sinus bradycardia with occasional PVC.  Heart rate 55 bpm.  QTc interval 369 ms.  February 2016ECG (independently read by me): Sinus bradycardia 50 bpm.  No ectopy.  Normal intervals.   LABS:  BMP Latest Ref Rng 01/25/2009 01/24/2009 01/23/2009  Glucose 70 - 99 mg/dL 95 109(H) 155(H)  BUN 6 - 23 mg/dL 16 39(H) 38(H)  Creatinine 0.4 - 1.5 mg/dL 0.73 0.75 0.91  Sodium 135 - 145 mEq/L 135 138 134(L)  Potassium 3.5 - 5.1 mEq/L 3.5 DELTA CHECK NOTED REPEATED TO VERIFY 4.3 5.0  Chloride 96 - 112 mEq/L 108 110 103  CO2 19 - 32 mEq/L _0 Calcium 8.4 - 10.5 mg/dL 7.8(L) 8.3(L) 8.7   Hepatic Function Latest Ref Rng 06/30/2015 01/23/2009  Total Protein 6.1 - 8.1 g/dL 6.9 5.9(L)  Albumin 3.6 - 5.1 g/dL 3.9 2.9(L)  AST 10 - 35 U/L 13 18  ALT 9 - 46 U/L 14 21  Alk Phosphatase 40 - 115 U/L 69 48  Total Bilirubin 0.2 - 1.2 mg/dL 0.5 0.6  Bilirubin, Direct <=0.2 mg/dL 0.1 0.1   CBC Latest Ref Rng 03/28/2009 01/26/2009 01/25/2009  WBC 4.5-10.5 10*3/microliter 10.3 10.8(H) 11.8(H)  Hemoglobin 13.0-17.0 g/dL 13.3 8.4(L) 8.6(L)  Hematocrit 39.0-52.0 % 40.9 24.8(L) 25.3(L)  Platelets 150.0-400.0 K/uL 234.0 190 184   Lab Results  Component Value Date   MCV 90.6 03/28/2009   MCV 91.6 01/26/2009   MCV 90.8 01/25/2009   No results found for: TSH   No results found for: HGBA1C  Lipid Panel     Component Value Date/Time   CHOL 153 06/30/2015 0808   TRIG 80 06/30/2015 0808   HDL 45 06/30/2015 0808   CHOLHDL 3.4 06/30/2015 0808   VLDL 16 06/30/2015 0808   LDLCALC 92 06/30/2015 0808     RADIOLOGY: No results found.    ASSESSMENT AND PLAN: Jordan [name] is a 70 year-old African-American male who underwent high-speed rotational artherectomy of the severely calcified focal LAD stenosis with insertion of a 3.0x30 mm DES Cypher  stent. Clinically, he continues to do  well with reference to his coronary obstructive disease without recurrent anginal symptoms. His last nuclear perfusion study  suggests patency of this vessel with normal perfusion.  He is noticed no change in exercise tolerance.  He continues to swim at least 4-5 days per week  for an hour, usually doing the doggie paddle. With reference to his obstructive sleep apnea, and he has been on therapy since 2009.  Due to machine malfunction, he received a new CPAP machine and is now using a nasal mask.  He admits to 100% compliance.  He denies breakthrough snoring or residual daytime sleepiness.  I last saw him, his blood pressure was low and I reduced his lisinopril to 2.5 mg daily.  He continues to be on carvedilol 3.125 mg twice a day.  His blood pressure today is excellent.  He is diabetic on metformin. With his CAD and diabetes mellitus target LDL is less than 70.  When I last saw him, I discontinued simvastatin and started him on Crestor.  He has been able to tolerate 5 mg twice a day without recurrent myalgias.  His most recent laboratory was reviewed and his LDL was 92.  I have suggested he increase Crestor to 3 times per week for several weeks and if tolerated, then increase this to every other day and eventually daily.  Repeat blood work will be obtained in 3 months.  I reviewed his echo Doppler study, which shows  normal systolic function with moderate diastolic dysfunction.  There were no significant valvular abnormalities.  He is normal pulmonary pressures.  He continues to exercise regular.  I discussed importance of additional weight loss.  I will see him in follow-up of his repeat laboratory and further recommendations will be made at that time.    Time spent: 25 minutes  Troy Sine, MD, [facility]  08/08/2015 7:05 AM

## 2015-08-14 ENCOUNTER — Other Ambulatory Visit: Payer: Self-pay | Admitting: Cardiovascular Disease

## 2015-10-23 ENCOUNTER — Encounter (HOSPITAL_COMMUNITY): Payer: Self-pay

## 2015-10-23 ENCOUNTER — Ambulatory Visit (HOSPITAL_COMMUNITY): Payer: Medicare Other | Attending: Diagnostic Radiology

## 2015-10-23 DIAGNOSIS — R2681 Unsteadiness on feet: Secondary | ICD-10-CM

## 2015-10-23 DIAGNOSIS — M25662 Stiffness of left knee, not elsewhere classified: Secondary | ICD-10-CM | POA: Insufficient documentation

## 2015-10-23 DIAGNOSIS — M25661 Stiffness of right knee, not elsewhere classified: Secondary | ICD-10-CM | POA: Diagnosis present

## 2015-10-23 NOTE — Therapy (Signed)
[place], [place], [address] Phone: [phone]   Fax:  [phone]  Physical Therapy Evaluation  Patient Details  Name: Jordan [name] MRN: [id number] Date of Birth: [birth year] Referring Provider: Stephenie Acres   Encounter Date: 10/23/2015      PT End of Session - 10/23/15 1615    Visit Number 1   Number of Visits 16   Date for PT Re-Evaluation 11/23/15   Authorization Type [employer] Medicare   Authorization Time Period 10/23/15-12/23/15   Authorization - Visit Number 1   Authorization - Number of Visits 10   PT Start Time 1519   PT Stop Time 1607   PT Time Calculation (min) 48 min   Equipment Utilized During Treatment Gait belt   Activity Tolerance Patient tolerated treatment well   Behavior During Therapy [facility] for tasks assessed/performed      Past Medical History:  Diagnosis Date  . Arthritis   . CAD (coronary artery disease)   . Colon polyps 01/23/2010   Descending  . Diabetes mellitus   . GI bleed   . Hypertension   . Hypotension    transient  . OSA (obstructive sleep apnea)    Severe, on CPAP therapy    Past Surgical History:  Procedure Laterality Date  . CARDIAC CATHETERIZATION  05/01/2007   Recommend rotational atherectomy followed by PTCA and probable stenting of LAD.  Marland Kitchen CARDIAC CATHETERIZATION  05/28/2007   Mid LAD hihgh grade 80-90% stenosis, stented with a 3x57mm Cypher stent implanted at 16atm, postdilated with a 3.25x80mm Quantum at 16atm, which revealed excellent wall appoistion - 5.66mm  . CARDIOVASCULAR STRESS TEST  02/05/2011   Perfusion defect seen in inferior myocardial region consistent with diagphragmatic attenuation. Remaining myocardium demonstrates normal myocardial perfusion with no evidence of ischemia or infarct. ECG is positive for ischemia.  . CPAP/BIPAP SLEEP STUDY  07/01/2007   AHI-0.5/hr at 11cm water pressure, RDI-12.6/hr  . LOWER ARTERIAL DOPPLER  06/03/2007   No evidence of  dissection, AV fistula, or active pseudoaneurysm.  Marland Kitchen SLEEP STUDY  05/13/2007   AHI-11.52/hr, AHI REM-56.0/hr, RDI-11.7/hr, RDI REM-56.0/hr, avg oxygen sat-94%  . TRANSTHORACIC ECHOCARDIOGRAM  03/13/2010   EF [id number], normal diastolic function, mild MR, mild TR, and normal RVSP    There were no vitals filed for this visit.       Subjective Assessment - 10/23/15 1522    Subjective The patient reports history >1year of pain with gong up/down steps, some balance issues with walking, Pt come to [facility] from [facility] and has had some injections 2 of 3, but he is not sure what the injections were.    Pertinent History History of vertigo, remote history of Left knee instability/dislocation (1960's)    How long can you sit comfortably? None, some limitations imposed from hip/pelvis pain    How long can you stand comfortably? 10-15 minutes per bout, but reports this was previously limited more by sideeffects from his statins.    How long can you walk comfortably? Unsure; does not walk a lot but does swim 4 days weekly.    Diagnostic tests Xrays   Patient Stated Goals Walking beter and greater ease with going up/down steps   Currently in Pain? No/denies            [facility] PT Assessment - 10/23/15 0001      Assessment   Medical Diagnosis Bilat Knee OA    Referring Provider Stephenie Acres    Onset  Date/Surgical Date --  ~1 year ago   Hand Dominance Right   Next MD Visit 10/31/15   Prior Therapy None     Precautions   Precautions None     Restrictions   Weight Bearing Restrictions No     Balance Screen   Has the patient fallen in the past 6 months Yes   How many times? 1   Has the patient had a decrease in activity level because of a fear of falling?  Yes   Is the patient reluctant to leave their home because of a fear of falling?  No     Home Environment   Living Environment Other (Comment)   Additional Comments 2 steps into the house, full flight to basement      Prior Function    Level of Independence Independent   Vocation Retired   Database administrator / Strength   AROM / PROM / Strength Strength;PROM     PROM   PROM Assessment Site Knee   Right/Left Knee Right;Left   Right Knee Extension -5   Right Knee Flexion 95  (-5-95)   Left Knee Extension 5   Left Knee Flexion 106  (5-106)     Strength   Strength Assessment Site Hip;Knee;Ankle   Right/Left Hip Right;Left   Right Hip Flexion 5/5   Right Hip Extension 3+/5   Right Hip External Rotation  5/5   Right Hip Internal Rotation 5/5   Right Hip ABduction 3+/5   Right Hip ADduction 4/5   Left Hip Flexion 5/5   Left Hip Extension 4-/5   Left Hip External Rotation 5/5   Left Hip Internal Rotation 5/5   Left Hip ABduction 3+/5   Left Hip ADduction 4/5   Right/Left Knee Right;Left   Right Knee Flexion 5/5   Right Knee Extension 5/5   Left Knee Flexion 5/5   Left Knee Extension 5/5   Right/Left Ankle Right;Left   Right Ankle Dorsiflexion 5/5   Right Ankle Plantar Flexion 1/5   Left Ankle Dorsiflexion 5/5   Left Ankle Plantar Flexion 2-/5     Special Tests    Special Tests Hip Special Tests   Hip Special Tests  [facility] Test    Findings Negative   Side Right;Left     Transfers   Five time sit to stand comments  18.23s, hands free   Comments TUG: TImed Up and Go   14.28s     Ambulation/Gait   Ambulation Distance (Feet) 450 Feet   Assistive device None   Gait velocity 0.1m/s   Stairs Yes   Stair Management Technique Two rails;Alternating pattern   Number of Stairs 4   Height of Stairs 7.75  No discomfort up than down.      Balance   Balance Assessed Yes     Static Standing Balance   Static Standing Balance -  Activities  Single Leg Stance - Right Leg;Single Leg Stance - Left Leg  bilat < 5s                   [facility] Adult PT Treatment/Exercise - 10/23/15 0001      Exercises   Exercises Knee/Hip     Knee/Hip Exercises: Stretches   Hip Flexor  Stretch --  3x15s bilat   Other Knee/Hip Stretches seated heel raises  2x10  PT Short Term Goals - 10/23/15 1624      PT SHORT TERM GOAL #1   Title After 2 weeks pt will demonstrate independence and compliance in HEP.   Status New     PT SHORT TERM GOAL #2   Title After 4 weeks patient will demonstrate improved knee ROM > 110 degree bilat to improve comfort during funcitonal activities.    Status New     PT SHORT TERM GOAL #3   Title After 4 weeks patient will demonstrate improved funcitonal mobility with 5xSTS <13s and TUG test< 13sec.    Status New           PT Long Term Goals - 10/23/15 1626      PT LONG TERM GOAL #1   Title After 7 weeks pt will demonstrate improve bilat knee flexion > 118 degrees flexion bilat.      PT LONG TERM GOAL #2   Title After 7 weeks pt will demonstrate improved funcitonal strength with 5xSTS<12s and hip abduction > 4/5 bilat.      PT LONG TERM GOAL #3   Title After 7 weeks pt will demonstrate improved balance AEB SLS> 15 seconds bilat to improve safety at homeand confidence with mobility in the community and traveling.    Status New     PT LONG TERM GOAL #4   Title After 7 weeks pt will demonstrate improved AMB speed > 1.63m/s to improve ability to travel in the airport as needed during travel without needing a WC.    Status New               Plan - 10/23/15 1617    Clinical Impression Statement Pt is a 70yo black male who presents 1Y s/p bilat knee pain which he reports is worse with prolonged gait, and ascending stairs. The patient demonstrates bilat knee joint ROM restrictions R more restricted than left. The patient demonstrate weakness in the hips, noted during ambulation and manageent of steps. The patient is impaired in balance. All of these impairments are limiting the patient's ability to perform ADL, IADL, and household mobility at [facility] and negatively affecting his quality of life.    Rehab  Potential Good   PT Frequency 2x / week   PT Duration 8 weeks   PT Treatment/Interventions Moist Heat;Therapeutic activities;Gait training;Stair training;Functional mobility training;Therapeutic exercise;Balance training;Patient/family education;Passive range of motion;Manual techniques;Dry needling   PT Next Visit Plan Review Goals, HEP, Berg Balance Test   PT Home Exercise Plan 10/23/15 (knee flexion stretch on steps, seated heel raises); Next session consider adding-in bridges, abduction heel slides, and Tband clams    Consulted and Agree with Plan of Care Patient      Patient will benefit from skilled therapeutic intervention in order to improve the following deficits and impairments:  Abnormal gait, Decreased activity tolerance, Decreased balance, Decreased mobility, Decreased strength, Hypomobility, Increased edema, Difficulty walking, Decreased range of motion, Obesity, Pain, Impaired flexibility, Postural dysfunction  Visit Diagnosis: Stiffness of right knee, not elsewhere classified - Plan: PT plan of care cert/re-cert  Stiffness of left knee, not elsewhere classified - Plan: PT plan of care cert/re-cert  Unsteadiness on feet - Plan: PT plan of care cert/re-cert      G-Codes - [id number]    Functional Assessment Tool Used Clinical Judgment   Functional Limitation Mobility: Walking and moving around   Mobility: Walking and Moving Around Current Status [id number]) At least 20 percent but less than 40 percent impaired, limited  or restricted   Mobility: Walking and Moving Around Goal Status [phone]) At least 20 percent but less than 40 percent impaired, limited or restricted       Problem List Patient Active Problem List   Diagnosis Date Noted  . Type 2 diabetes mellitus with other circulatory complications ([place]) 0000000  . History of gastric ulcer 05/23/2014  . OSA on CPAP 03/04/2013  . Morbid obesity ([place]) 03/04/2013  . CAD (coronary artery disease) 03/04/2013  .  Hyperlipidemia with target LDL less than 70 03/04/2013  . Type 2 diabetes mellitus ([place]) 03/04/2013  . GERD (gastroesophageal reflux disease) 03/04/2013  . Vertigo 03/04/2013  . GASTRIC ULCER, ACUTE 03/28/2009    4:38 PM, 10/23/15 Etta Grandchild, PT, DPT Physical Therapist at [place] [phone] (office)     [place] [address] [place], [place], [address] Phone: [phone]   Fax:  [phone]  Name: DAYLIN VOGLER MRN: [id number] Date of Birth: June 04, 1945

## 2015-10-25 ENCOUNTER — Ambulatory Visit (HOSPITAL_COMMUNITY): Payer: Medicare Other

## 2015-10-25 DIAGNOSIS — R2681 Unsteadiness on feet: Secondary | ICD-10-CM

## 2015-10-25 DIAGNOSIS — M25661 Stiffness of right knee, not elsewhere classified: Secondary | ICD-10-CM | POA: Diagnosis not present

## 2015-10-25 DIAGNOSIS — M25662 Stiffness of left knee, not elsewhere classified: Secondary | ICD-10-CM
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
## 2015-10-31 ENCOUNTER — Ambulatory Visit (HOSPITAL_COMMUNITY): Payer: Medicare Other

## 2015-10-31 ENCOUNTER — Telehealth (HOSPITAL_COMMUNITY): Payer: Self-pay

## 2015-10-31 NOTE — Telephone Encounter (Signed)
MD did not want him to take PT today after getting shots. He will come tomorrow.

## 2015-11-01 ENCOUNTER — Ambulatory Visit (HOSPITAL_COMMUNITY): Payer: Medicare Other | Attending: Diagnostic Radiology

## 2015-11-01 DIAGNOSIS — R2681 Unsteadiness on feet: Secondary | ICD-10-CM | POA: Diagnosis present

## 2015-11-01 DIAGNOSIS — M25662 Stiffness of left knee, not elsewhere classified: Secondary | ICD-10-CM

## 2015-11-01 DIAGNOSIS — M25661 Stiffness of right knee, not elsewhere classified: Secondary | ICD-10-CM

## 2015-11-01 NOTE — Therapy (Signed)
[place], [place], [address] Phone: [phone]   Fax:  [phone]  Physical Therapy Treatment  Patient Details  Name: Jordan [name] MRN: [id number] Date of Birth: 11-Mar-1945 Referring Provider: Stephenie Acres  Encounter Date: 11/01/2015      PT End of Session - 11/01/15 1437    Visit Number 3   Number of Visits 16   Date for PT Re-Evaluation 11/23/15   Authorization Type UHC Medicare   Authorization Time Period 10/23/15-12/23/15   Authorization - Visit Number 3   Authorization - Number of Visits 10   PT Start Time [id number]   PT Stop Time 1519   PT Time Calculation (min) 46 min   Activity Tolerance Patient tolerated treatment well   Behavior During Therapy [facility] for tasks assessed/performed      Past Medical History:  Diagnosis Date  . Arthritis   . CAD (coronary artery disease)   . Colon polyps 01/23/2010   Descending  . Diabetes mellitus   . GI bleed   . Hypertension   . Hypotension    transient  . OSA (obstructive sleep apnea)    Severe, on CPAP therapy    Past Surgical History:  Procedure Laterality Date  . CARDIAC CATHETERIZATION  05/01/2007   Recommend rotational atherectomy followed by PTCA and probable stenting of LAD.  Marland Kitchen CARDIAC CATHETERIZATION  05/28/2007   Mid LAD hihgh grade 80-90% stenosis, stented with a 3x40mm Cypher stent implanted at 16atm, postdilated with a 3.25x53mm Quantum at 16atm, which revealed excellent wall appoistion - 5.64mm  . CARDIOVASCULAR STRESS TEST  02/05/2011   Perfusion defect seen in inferior myocardial region consistent with diagphragmatic attenuation. Remaining myocardium demonstrates normal myocardial perfusion with no evidence of ischemia or infarct. ECG is positive for ischemia.  . CPAP/BIPAP SLEEP STUDY  07/01/2007   AHI-0.5/hr at 11cm water pressure, RDI-12.6/hr  . LOWER ARTERIAL DOPPLER  06/03/2007   No evidence of dissection, AV fistula, or active pseudoaneurysm.  Marland Kitchen  SLEEP STUDY  05/13/2007   AHI-11.52/hr, AHI REM-56.0/hr, RDI-11.7/hr, RDI REM-56.0/hr, avg oxygen sat-94%  . TRANSTHORACIC ECHOCARDIOGRAM  03/13/2010   EF 123456, normal diastolic function, mild MR, mild TR, and normal RVSP    There were no vitals filed for this visit.      Subjective Assessment - 11/01/15 1436    Subjective Pt stated he got shots in Bil knees today.  Reports he got a shot in both knees yesterday and reports pain free today.  Reports compliance with HEP 1x every other day.     Pertinent History History of vertigo, remote history of Left knee instability/dislocation (1960's)    Patient Stated Goals Walking beter and greater ease with going up/down steps   Currently in Pain? No/denies            [facility] PT Assessment - 11/01/15 0001      Assessment   Medical Diagnosis Bilat Knee OA    Referring Provider Stephenie Acres   Onset Date/Surgical Date --  ~1 year ago   Hand Dominance Right   Next MD Visit December   Prior Therapy None            [facility] Adult PT Treatment/Exercise - 11/01/15 0001      Knee/Hip Exercises: Stretches   Active Hamstring Stretch 30 seconds;3 reps   Active Hamstring Stretch Limitations supine with rope   Hip Flexor Stretch Both;5 reps;10 seconds   Hip Flexor Stretch Limitations knee drives on [id number]  step 5x10"   Gastroc Stretch 3 reps;30 seconds   Gastroc Stretch Limitations slant board     Knee/Hip Exercises: Seated   Other Seated Knee/Hip Exercises seated heel and toe raises 20 reps each     Knee/Hip Exercises: Supine   Bridges Limitations 2 setsx 10 repsx 3" holds   Other Supine Knee/Hip Exercises clam with RTB 2x 10   Other Supine Knee/Hip Exercises ABD slides 2x 10 BLE with cueing for form to reduce ER with slides ("keep toes pointed up towards ceiling")                  PT Short Term Goals - 10/23/15 1624      PT SHORT TERM GOAL #1   Title After 2 weeks pt will demonstrate independence and compliance in HEP.   Status  New     PT SHORT TERM GOAL #2   Title After 4 weeks patient will demonstrate improved knee ROM > 110 degree bilat to improve comfort during funcitonal activities.    Status New     PT SHORT TERM GOAL #3   Title After 4 weeks patient will demonstrate improved funcitonal mobility with 5xSTS <13s and TUG test< 13sec.    Status New           PT Long Term Goals - 10/23/15 1626      PT LONG TERM GOAL #1   Title After 7 weeks pt will demonstrate improve bilat knee flexion > 118 degrees flexion bilat.      PT LONG TERM GOAL #2   Title After 7 weeks pt will demonstrate improved funcitonal strength with 5xSTS<12s and hip abduction > 4/5 bilat.      PT LONG TERM GOAL #3   Title After 7 weeks pt will demonstrate improved balance AEB SLS> 15 seconds bilat to improve safety at homeand confidence with mobility in the community and traveling.    Status New     PT LONG TERM GOAL #4   Title After 7 weeks pt will demonstrate improved AMB speed > 1.36m/s to improve ability to travel in the airport as needed during travel without needing a WC.    Status New               Plan - 11/01/15 1442    Clinical Impression Statement Session focus on improving proximal musculature strengthneing with therapist facilitation for proper form and to continue breathing through exercises.  Pt able to demonstrate appropriate form with new exercises wtih min cueing to improve stabiltiy with exercise.  Pt demonstrate significant weakness proximal musculature, all exercises complete with 2 sets for strengthening.  Pt c/o cramping posterior thigh during bridge, added hamstring stretch with reports of cramping resolved.  Discussion held about compliance with HEP and encouraged to increase frequency for maximal benefits.  Due to decreased compliance with initial HEP, no additional exercises given this session.   Rehab Potential Good   PT Frequency 2x / week   PT Duration 8 weeks   PT Treatment/Interventions Moist  Heat;Therapeutic activities;Gait training;Stair training;Functional mobility training;Therapeutic exercise;Balance training;Patient/family education;Passive range of motion;Manual techniques;Dry needling   PT Next Visit Plan Next session focus on proximal musculature strengthening including bridges, abduction heel slides, and Tband clams, if good form and compliance wiht additional exercises add to HEP.   PT Home Exercise Plan 10/23/15 (knee flexion stretch on steps, seated heel raises);  No additional exercises given this session.  If reports of increased compliance next session consider adding-in bridges,  abduction heel slides, and Tband clams       Patient will benefit from skilled therapeutic intervention in order to improve the following deficits and impairments:  Abnormal gait, Decreased activity tolerance, Decreased balance, Decreased mobility, Decreased strength, Hypomobility, Increased edema, Difficulty walking, Decreased range of motion, Obesity, Pain, Impaired flexibility, Postural dysfunction  Visit Diagnosis: Stiffness of right knee, not elsewhere classified  Stiffness of left knee, not elsewhere classified  Unsteadiness on feet     Problem List Patient Active Problem List   Diagnosis Date Noted  . Type 2 diabetes mellitus with other circulatory complications ([place]) 0000000  . History of gastric ulcer 05/23/2014  . OSA on CPAP 03/04/2013  . Morbid obesity ([place]) 03/04/2013  . CAD (coronary artery disease) 03/04/2013  . Hyperlipidemia with target LDL less than 70 03/04/2013  . Type 2 diabetes mellitus ([place]) 03/04/2013  . GERD (gastroesophageal reflux disease) 03/04/2013  . Vertigo 03/04/2013  . GASTRIC ULCER, ACUTE 03/28/2009   Ihor Austin, [place]; [place]  Aldona Lento 11/01/2015, 3:44 PM  [place] [address] [place], [place], [address] Phone: [phone]   Fax:  [phone]  Name: Jordan [name] MRN: [id number] Date of Birth: 03-Mar-1945

## 2015-11-03 ENCOUNTER — Ambulatory Visit (HOSPITAL_COMMUNITY): Payer: Medicare Other

## 2015-11-03 DIAGNOSIS — R2681 Unsteadiness on feet: Secondary | ICD-10-CM

## 2015-11-03 DIAGNOSIS — M25661 Stiffness of right knee, not elsewhere classified: Secondary | ICD-10-CM

## 2015-11-03 DIAGNOSIS — M25662 Stiffness of left knee, not elsewhere classified: Secondary | ICD-10-CM

## 2015-11-03 NOTE — Therapy (Signed)
[place], [place], [address] Phone: [phone]   Fax:  [phone]  Physical Therapy Treatment  Patient Details  Name: Jordan [name] MRN: [id number] Date of Birth: 1945/11/23 Referring Provider: Stephenie Acres  Encounter Date: 11/03/2015      PT End of Session - 11/03/15 1552    Visit Number 4   Number of Visits 16   Date for PT Re-Evaluation 11/23/15   Authorization Type UHC Medicare   Authorization Time Period 10/23/15-12/23/15   Authorization - Visit Number 4   Authorization - Number of Visits 10   PT Start Time 6269   PT Stop Time 1555   PT Time Calculation (min) 38 min   Equipment Utilized During Treatment Gait belt   Activity Tolerance Patient tolerated treatment well   Behavior During Therapy [facility] for tasks assessed/performed      Past Medical History:  Diagnosis Date  . Arthritis   . CAD (coronary artery disease)   . Colon polyps 01/23/2010   Descending  . Diabetes mellitus   . GI bleed   . Hypertension   . Hypotension    transient  . OSA (obstructive sleep apnea)    Severe, on CPAP therapy    Past Surgical History:  Procedure Laterality Date  . CARDIAC CATHETERIZATION  05/01/2007   Recommend rotational atherectomy followed by PTCA and probable stenting of LAD.  Marland Kitchen CARDIAC CATHETERIZATION  05/28/2007   Mid LAD hihgh grade 80-90% stenosis, stented with a 3x73m Cypher stent implanted at 16atm, postdilated with a 3.25x2102mQuantum at 16atm, which revealed excellent wall appoistion - 5.7943m. CARDIOVASCULAR STRESS TEST  02/05/2011   Perfusion defect seen in inferior myocardial region consistent with diagphragmatic attenuation. Remaining myocardium demonstrates normal myocardial perfusion with no evidence of ischemia or infarct. ECG is positive for ischemia.  . CPAP/BIPAP SLEEP STUDY  07/01/2007   AHI-0.5/hr at 11cm water pressure, RDI-12.6/hr  . LOWER ARTERIAL DOPPLER  06/03/2007   No evidence of  dissection, AV fistula, or active pseudoaneurysm.  . SMarland KitchenEEP STUDY  05/13/2007   AHI-11.52/hr, AHI REM-56.0/hr, RDI-11.7/hr, RDI REM-56.0/hr, avg oxygen sat-94%  . TRANSTHORACIC ECHOCARDIOGRAM  03/13/2010   EF >55>48%ormal diastolic function, mild MR, mild TR, and normal RVSP    There were no vitals filed for this visit.      Subjective Assessment - 11/03/15 1524    Subjective Pt still doing well since injections earlier this week. Exercise compliance has been intermittent. He reports some mild discomfort in his medial right knee but otherwise is feeling well.    Pertinent History History of vertigo, remote history of Left knee instability/dislocation (1960's)    Currently in Pain? No/denies                         [facility]/Exercise - 11/03/15 0001      Knee/Hip Exercises: Stretches   Quad Stretch 5 reps;20 seconds;Both  Prone Quads Stretch, PT assisted c MET   Other Knee/Hip Stretches Supine lateral hip/quads stretch c strap  1x3' gait belt around knees (adduction)     Knee/Hip Exercises: Standing   Forward Step Up 1 set;10 reps;Step Height: 4";Both     Knee/Hip Exercises: Supine   Bridges Limitations 2 setsx 10 repsx 3" holds   Other Supine Knee/Hip Exercises seated hip IR AROM 1x15x3sH  Seated hip ER AROM 2x10 bilat      Manual Therapy   Manual Therapy  Joint mobilization   Joint Mobilization rotational mobs IR/ER 2x20sec bil in traction, 90*                  PT Short Term Goals - 10/23/15 1624      PT SHORT TERM GOAL #1   Title After 2 weeks pt will demonstrate independence and compliance in HEP.   Status New     PT SHORT TERM GOAL #2   Title After 4 weeks patient will demonstrate improved knee ROM > 110 degree bilat to improve comfort during funcitonal activities.    Status New     PT SHORT TERM GOAL #3   Title After 4 weeks patient will demonstrate improved funcitonal mobility with 5xSTS <13s and TUG test< 13sec.    Status  New           PT Long Term Goals - 10/23/15 1626      PT LONG TERM GOAL #1   Title After 7 weeks pt will demonstrate improve bilat knee flexion > 118 degrees flexion bilat.      PT LONG TERM GOAL #2   Title After 7 weeks pt will demonstrate improved funcitonal strength with 5xSTS<12s and hip abduction > 4/5 bilat.      PT LONG TERM GOAL #3   Title After 7 weeks pt will demonstrate improved balance AEB SLS> 15 seconds bilat to improve safety at homeand confidence with mobility in the community and traveling.    Status New     PT LONG TERM GOAL #4   Title After 7 weeks pt will demonstrate improved AMB speed > 1.33ms to improve ability to travel in the airport as needed during travel without needing a WC.    Status New               Plan - 11/03/15 1554    Clinical Impression Statement Pt toelration session well. Focussed on prolonged stretching to improved knee flexion ROM with limited noted success. Progress strengthening to 4" step ups which are tolerated better than the higher steps at home. No updated to HEP at this time. Pt making good progres otward goals. Attents at hip IR strengthening seem limited in range but not clear if thsi is from tightness in glute med or femoral anteversion.    Rehab Potential Good   PT Frequency 2x / week   PT Duration 8 weeks   PT Treatment/Interventions Moist Heat;Therapeutic activities;Gait training;Stair training;Functional mobility training;Therapeutic exercise;Balance training;Patient/family education;Passive range of motion;Manual techniques;Dry needling   PT Next Visit Plan Continue focus on proximal musculature strengthening including bridges, abduction heel slides, and Tband clams, if good form and compliance wiht additional exercises add to HEP.   PT Home Exercise Plan 10/23/15 (knee flexion stretch on steps, seated heel raises);  No additional exercises given this session.  If reports of increased compliance next session consider  adding-in bridges, abduction heel slides, and Tband clams    Consulted and Agree with Plan of Care Patient      Patient will benefit from skilled therapeutic intervention in order to improve the following deficits and impairments:  Abnormal gait, Decreased activity tolerance, Decreased balance, Decreased mobility, Decreased strength, Hypomobility, Increased edema, Difficulty walking, Decreased range of motion, Obesity, Pain, Impaired flexibility, Postural dysfunction  Visit Diagnosis: Stiffness of right knee, not elsewhere classified  Stiffness of left knee, not elsewhere classified  Unsteadiness on feet     Problem List Patient Active Problem List   Diagnosis Date Noted  . Type  2 diabetes mellitus with other circulatory complications ([place]) 22/17/9810  . History of gastric ulcer 05/23/2014  . OSA on CPAP 03/04/2013  . Morbid obesity ([place]) 03/04/2013  . CAD (coronary artery disease) 03/04/2013  . Hyperlipidemia with target LDL less than 70 03/04/2013  . Type 2 diabetes mellitus ([place]) 03/04/2013  . GERD (gastroesophageal reflux disease) 03/04/2013  . Vertigo 03/04/2013  . GASTRIC ULCER, ACUTE 03/28/2009    3:59 PM, 11/03/15 Etta Grandchild, PT, DPT Physical Therapist at [place] [phone] (office)     [place] [address] [place], [place], [address] Phone: [phone]   Fax:  [phone]  Name: Jordan [name] MRN: [id number] Date of Birth: 09/05/1945

## 2015-11-06 ENCOUNTER — Ambulatory Visit (HOSPITAL_COMMUNITY): Payer: Medicare Other

## 2015-11-06 DIAGNOSIS — R2681 Unsteadiness on feet: Secondary | ICD-10-CM

## 2015-11-06 DIAGNOSIS — M25661 Stiffness of right knee, not elsewhere classified: Secondary | ICD-10-CM

## 2015-11-06 DIAGNOSIS — M25662 Stiffness of left knee, not elsewhere classified: Secondary | ICD-10-CM

## 2015-11-06 NOTE — Therapy (Signed)
[place], [place], [address] Phone: [phone]   Fax:  [phone]  Physical Therapy Treatment  Patient Details  Name: Jordan [name] MRN: [id number] Date of Birth: 1945-11-01 Referring Provider: Stephenie Acres  Encounter Date: 11/06/2015      PT End of Session - 11/06/15 1458    Visit Number 5   Number of Visits 16   Date for PT Re-Evaluation 11/23/15   Authorization Type U[employer] Medicare   Authorization Time Period 10/23/15-12/23/15   Authorization - Visit Number 4   Authorization - Number of Visits 10   PT Start Time [id number]  Pt arrived late    PT Stop Time 1515   PT Time Calculation (min) 32 min   Equipment Utilized During Treatment Gait belt   Activity Tolerance Patient tolerated treatment well   Behavior During Therapy [facility] for tasks assessed/performed      Past Medical History:  Diagnosis Date  . Arthritis   . CAD (coronary artery disease)   . Colon polyps 01/23/2010   Descending  . Diabetes mellitus   . GI bleed   . Hypertension   . Hypotension    transient  . OSA (obstructive sleep apnea)    Severe, on CPAP therapy    Past Surgical History:  Procedure Laterality Date  . CARDIAC CATHETERIZATION  05/01/2007   Recommend rotational atherectomy followed by PTCA and probable stenting of LAD.  Marland Kitchen CARDIAC CATHETERIZATION  05/28/2007   Mid LAD hihgh grade 80-90% stenosis, stented with a 3x26mm Cypher stent implanted at 16atm, postdilated with a 3.25x11mm Quantum at 16atm, which revealed excellent wall appoistion - 5.10mm  . CARDIOVASCULAR STRESS TEST  02/05/2011   Perfusion defect seen in inferior myocardial region consistent with diagphragmatic attenuation. Remaining myocardium demonstrates normal myocardial perfusion with no evidence of ischemia or infarct. ECG is positive for ischemia.  . CPAP/BIPAP SLEEP STUDY  07/01/2007   AHI-0.5/hr at 11cm water pressure, RDI-12.6/hr  . LOWER ARTERIAL DOPPLER  06/03/2007   No evidence of dissection, AV fistula, or active pseudoaneurysm.  Marland Kitchen SLEEP STUDY  05/13/2007   AHI-11.52/hr, AHI REM-56.0/hr, RDI-11.7/hr, RDI REM-56.0/hr, avg oxygen sat-94%  . TRANSTHORACIC ECHOCARDIOGRAM  03/13/2010   EF 123456, normal diastolic function, mild MR, mild TR, and normal RVSP    There were no vitals filed for this visit.      Subjective Assessment - 11/06/15 1446    Subjective Pt reports he is doing well today. No major complaints of pain at rest, but some discomfort while performing stairs. His HEP has been performed partially. He continues to swim for exercise.    Pertinent History History of vertigo, remote history of Left knee instability/dislocation (1960's)                          [facility] Adult PT Treatment/Exercise - 11/06/15 0001      Knee/Hip Exercises: Stretches   Quad Stretch 5 reps;20 seconds;Both  Driver stretch on 12" step 5x30sec bilat   Gastroc Stretch 3 reps;30 seconds   Gastroc Stretch Limitations slant board   Other Knee/Hip Stretches Supine knee hang c 10lbs bil, and moist heat on quads  7min     Knee/Hip Exercises: Standing   Forward Step Up 10 reps;Step Height: 4";Both;2 sets   Step Down 5 reps;Step Height: 4";Both;2 sets     Knee/Hip Exercises: Seated   Long Arc Quad Both;10 reps;2 sets  c 10#  PT Short Term Goals - 10/23/15 1624      PT SHORT TERM GOAL #1   Title After 2 weeks pt will demonstrate independence and compliance in HEP.   Status New     PT SHORT TERM GOAL #2   Title After 4 weeks patient will demonstrate improved knee ROM > 110 degree bilat to improve comfort during funcitonal activities.    Status New     PT SHORT TERM GOAL #3   Title After 4 weeks patient will demonstrate improved funcitonal mobility with 5xSTS <13s and TUG test< 13sec.    Status New           PT Long Term Goals - 10/23/15 1626      PT LONG TERM GOAL #1   Title After 7 weeks pt will demonstrate improve  bilat knee flexion > 118 degrees flexion bilat.      PT LONG TERM GOAL #2   Title After 7 weeks pt will demonstrate improved funcitonal strength with 5xSTS<12s and hip abduction > 4/5 bilat.      PT LONG TERM GOAL #3   Title After 7 weeks pt will demonstrate improved balance AEB SLS> 15 seconds bilat to improve safety at homeand confidence with mobility in the community and traveling.    Status New     PT LONG TERM GOAL #4   Title After 7 weeks pt will demonstrate improved AMB speed > 1.29m/s to improve ability to travel in the airport as needed during travel without needing a WC.    Status New               Plan - 11/06/15 1459    Clinical Impression Statement Pt tolerating session well today, with increases in exercise intensity/reps. Most exercises performed pain free. Knee flexion range is improving, now more spingy end feel than hard end feel previously.  Pt encouraged to continue with compliance in daily HEP for best outcome.    Rehab Potential Good   PT Frequency 2x / week   PT Duration 8 weeks   PT Treatment/Interventions Moist Heat;Therapeutic activities;Gait training;Stair training;Functional mobility training;Therapeutic exercise;Balance training;Patient/family education;Passive range of motion;Manual techniques;Dry needling   PT Next Visit Plan Continue focus on proximal musculature strengthening including bridges, abduction heel slides, and Tband clams, if good form and compliance wiht additional exercises add to HEP.   PT Home Exercise Plan 10/23/15 (knee flexion stretch on steps, seated heel raises);  No additional exercises given this session.  If reports of increased compliance next session consider adding-in bridges, abduction heel slides, and Tband clams    Consulted and Agree with Plan of Care Patient      Patient will benefit from skilled therapeutic intervention in order to improve the following deficits and impairments:  Abnormal gait, Decreased activity  tolerance, Decreased balance, Decreased mobility, Decreased strength, Hypomobility, Increased edema, Difficulty walking, Decreased range of motion, Obesity, Pain, Impaired flexibility, Postural dysfunction  Visit Diagnosis: Stiffness of right knee, not elsewhere classified  Stiffness of left knee, not elsewhere classified  Unsteadiness on feet     Problem List Patient Active Problem List   Diagnosis Date Noted  . Type 2 diabetes mellitus with other circulatory complications ([place]) 0000000  . History of gastric ulcer 05/23/2014  . OSA on CPAP 03/04/2013  . Morbid obesity ([place]) 03/04/2013  . CAD (coronary artery disease) 03/04/2013  . Hyperlipidemia with target LDL less than 70 03/04/2013  . Type 2 diabetes mellitus ([place]) 03/04/2013  . GERD (gastroesophageal  reflux disease) 03/04/2013  . Vertigo 03/04/2013  . GASTRIC ULCER, ACUTE 03/28/2009    3:15 PM, 11/06/15 Etta Grandchild, PT, DPT Physical Therapist at [place] [phone] (office)     [place] [address] [place], [place], [address] Phone: [phone]   Fax:  [phone]  Name: Jordan [name] MRN: [id number] Date of Birth: [birth year]

## 2015-11-08 ENCOUNTER — Encounter (HOSPITAL_COMMUNITY): Payer: Medicare Other

## 2015-11-09 ENCOUNTER — Ambulatory Visit (HOSPITAL_COMMUNITY): Payer: Medicare Other | Admitting: Physical Therapy

## 2015-11-09 ENCOUNTER — Telehealth (HOSPITAL_COMMUNITY): Payer: Self-pay

## 2015-11-09 ENCOUNTER — Encounter (HOSPITAL_COMMUNITY): Payer: Medicare Other | Admitting: Physical Therapy

## 2015-11-09 DIAGNOSIS — M25661 Stiffness of right knee, not elsewhere classified: Secondary | ICD-10-CM | POA: Diagnosis not present

## 2015-11-09 DIAGNOSIS — M25662 Stiffness of left knee, not elsewhere classified: Secondary | ICD-10-CM

## 2015-11-09 DIAGNOSIS — R2681 Unsteadiness on feet: Secondary | ICD-10-CM

## 2015-11-09 NOTE — Telephone Encounter (Signed)
9/14 left a message that he couldn't come and that was all he said

## 2015-11-09 NOTE — Therapy (Signed)
[place], [place], [address] Phone: [phone]   Fax:  [phone]  Physical Therapy Treatment  Patient Details  Name: Jordan [name] MRN: [id number] Date of Birth: Mar 11, 1945 Referring Provider: Stephenie Acres  Encounter Date: 11/09/2015      PT End of Session - 11/09/15 1123    Visit Number 7   Number of Visits 16   Date for PT Re-Evaluation 11/23/15   Authorization Type UHC Medicare   Authorization Time Period 10/23/15-12/23/15   Authorization - Visit Number 7   Authorization - Number of Visits 10   PT Start Time 0820   PT Stop Time [id number]   PT Time Calculation (min) 38 min   Activity Tolerance Patient tolerated treatment well   Behavior During Therapy [facility] for tasks assessed/performed      Past Medical History:  Diagnosis Date  . Arthritis   . CAD (coronary artery disease)   . Colon polyps 01/23/2010   Descending  . Diabetes mellitus   . GI bleed   . Hypertension   . Hypotension    transient  . OSA (obstructive sleep apnea)    Severe, on CPAP therapy    Past Surgical History:  Procedure Laterality Date  . CARDIAC CATHETERIZATION  05/01/2007   Recommend rotational atherectomy followed by PTCA and probable stenting of LAD.  Marland Kitchen CARDIAC CATHETERIZATION  05/28/2007   Mid LAD hihgh grade 80-90% stenosis, stented with a 3x5mm Cypher stent implanted at 16atm, postdilated with a 3.25x65mm Quantum at 16atm, which revealed excellent wall appoistion - 5.83mm  . CARDIOVASCULAR STRESS TEST  02/05/2011   Perfusion defect seen in inferior myocardial region consistent with diagphragmatic attenuation. Remaining myocardium demonstrates normal myocardial perfusion with no evidence of ischemia or infarct. ECG is positive for ischemia.  . CPAP/BIPAP SLEEP STUDY  07/01/2007   AHI-0.5/hr at 11cm water pressure, RDI-12.6/hr  . LOWER ARTERIAL DOPPLER  06/03/2007   No evidence of dissection, AV fistula, or active pseudoaneurysm.  Marland Kitchen  SLEEP STUDY  05/13/2007   AHI-11.52/hr, AHI REM-56.0/hr, RDI-11.7/hr, RDI REM-56.0/hr, avg oxygen sat-94%  . TRANSTHORACIC ECHOCARDIOGRAM  03/13/2010   EF 123456, normal diastolic function, mild MR, mild TR, and normal RVSP    There were no vitals filed for this visit.      Subjective Assessment - 11/09/15 [id number]    Subjective Patient arrives today stating he is feeling well, no pain or major complaints today. He continues to swim for exercise. Continues to have discomfort with stairs.    Pertinent History History of vertigo, remote history of Left knee instability/dislocation (1960's)    Patient Stated Goals Walking beter and greater ease with going up/down steps   Currently in Pain? No/denies            [facility] PT Assessment - 11/09/15 0001      PROM   Right Knee Extension 5   Right Knee Flexion 103   Left Knee Extension 7   Left Knee Flexion 105                     [facility] Adult PT Treatment/Exercise - 11/09/15 0001      Knee/Hip Exercises: Stretches   Gastroc Stretch 3 reps;30 seconds   Gastroc Stretch Limitations slant board   Other Knee/Hip Stretches knee drives [id number] each LE, 5 second holds      Knee/Hip Exercises: Standing   Heel Raises Both;1 set;10 reps   Heel Raises Limitations heel and toe  Forward Lunges Both;1 set;10 reps   Forward Lunges Limitations 4 inch box    Step Down Both;5 reps   Step Down Limitations 2 inch box   lowered box height secondary to pain      Knee/Hip Exercises: Seated   Long Arc Quad Both;10 reps;2 sets     Knee/Hip Exercises: Supine   Bridges Limitations 2 setsx 10 repsx 3" holds   Other Supine Knee/Hip Exercises supine hip ABD with red TB 1x10      Manual Therapy   Manual Therapy Joint mobilization   Manual therapy comments performed separately from all other skilled services    Joint Mobilization knee flexion mobilizaton B in supine tib on femur                 PT Education - 11/09/15 0946    Education  provided Yes   Education Details applications of heat, ice, and heat combined with ice, continued to recommend that patient use ice    Person(s) Educated Patient   Methods Explanation   Comprehension Verbalized understanding          PT Short Term Goals - 10/23/15 1624      PT SHORT TERM GOAL #1   Title After 2 weeks pt will demonstrate independence and compliance in HEP.   Status New     PT SHORT TERM GOAL #2   Title After 4 weeks patient will demonstrate improved knee ROM > 110 degree bilat to improve comfort during funcitonal activities.    Status New     PT SHORT TERM GOAL #3   Title After 4 weeks patient will demonstrate improved funcitonal mobility with 5xSTS <13s and TUG test< 13sec.    Status New           PT Long Term Goals - 10/23/15 1626      PT LONG TERM GOAL #1   Title After 7 weeks pt will demonstrate improve bilat knee flexion > 118 degrees flexion bilat.      PT LONG TERM GOAL #2   Title After 7 weeks pt will demonstrate improved funcitonal strength with 5xSTS<12s and hip abduction > 4/5 bilat.      PT LONG TERM GOAL #3   Title After 7 weeks pt will demonstrate improved balance AEB SLS> 15 seconds bilat to improve safety at homeand confidence with mobility in the community and traveling.    Status New     PT LONG TERM GOAL #4   Title After 7 weeks pt will demonstrate improved AMB speed > 1.70m/s to improve ability to travel in the airport as needed during travel without needing a WC.    Status New               Plan - 11/09/15 1124    Clinical Impression Statement Continued with functional stretching and strengthening today, also continued with manual to B knees with focus being flexion mobilization of tibia on femur today. Otherwise continued with proximal hip muscle stabilization today and continued CKC work primarily with step ups. Tendency to compensate for hip abductor weakness with hip flexor musculature, tactile and verbal cues provided.  Measured knee ROM this morning with no major change since the measures taken at initial evaluation.    Rehab Potential Good   PT Frequency 2x / week   PT Duration 8 weeks   PT Treatment/Interventions Moist Heat;Therapeutic activities;Gait training;Stair training;Functional mobility training;Therapeutic exercise;Balance training;Patient/family education;Passive range of motion;Manual techniques;Dry needling   PT Next Visit  Plan Continue focus on proximal musculature strengthening including bridges, abduction heel slides, and Tband clams, if good form and compliance wiht additional exercises add to HEP.   Consulted and Agree with Plan of Care Patient      Patient will benefit from skilled therapeutic intervention in order to improve the following deficits and impairments:  Abnormal gait, Decreased activity tolerance, Decreased balance, Decreased mobility, Decreased strength, Hypomobility, Increased edema, Difficulty walking, Decreased range of motion, Obesity, Pain, Impaired flexibility, Postural dysfunction  Visit Diagnosis: Stiffness of right knee, not elsewhere classified  Stiffness of left knee, not elsewhere classified  Unsteadiness on feet     Problem List Patient Active Problem List   Diagnosis Date Noted  . Type 2 diabetes mellitus with other circulatory complications ([place]) 0000000  . History of gastric ulcer 05/23/2014  . OSA on CPAP 03/04/2013  . Morbid obesity ([place]) 03/04/2013  . CAD (coronary artery disease) 03/04/2013  . Hyperlipidemia with target LDL less than 70 03/04/2013  . Type 2 diabetes mellitus ([place]) 03/04/2013  . GERD (gastroesophageal reflux disease) 03/04/2013  . Vertigo 03/04/2013  . GASTRIC ULCER, ACUTE 03/28/2009    Deniece Ree PT, DPT [place] [address] [place], [place], [address] Phone: [phone]   Fax:  [phone]  Name: JEMELLE JANSMA MRN: [id number] Date of Birth:  04/28/1945

## 2015-11-13 ENCOUNTER — Ambulatory Visit (HOSPITAL_COMMUNITY): Payer: Medicare Other

## 2015-11-13 DIAGNOSIS — R2681 Unsteadiness on feet: Secondary | ICD-10-CM

## 2015-11-13 DIAGNOSIS — M25661 Stiffness of right knee, not elsewhere classified: Secondary | ICD-10-CM | POA: Diagnosis not present

## 2015-11-13 DIAGNOSIS — M25662 Stiffness of left knee, not elsewhere classified: Secondary | ICD-10-CM

## 2015-11-13 NOTE — Therapy (Signed)
[place], [place], [address] Phone: [phone]   Fax:  [phone]  Physical Therapy Treatment  Patient Details  Name: Jordan [name] MRN: [id number] Date of Birth: June 24, 1945 Referring Provider: Stephenie Acres  Encounter Date: 11/13/2015      PT End of Session - 11/13/15 1457    Visit Number 8   Number of Visits 16   Date for PT Re-Evaluation 11/23/15   Authorization Type UHC Medicare   Authorization Time Period 10/23/15-12/23/15   Authorization - Visit Number 8   Authorization - Number of Visits 10   PT Start Time [id number]  Pt arrived lated.    PT Stop Time 1513   PT Time Calculation (min) 31 min   Activity Tolerance Patient tolerated treatment well;No increased pain   Behavior During Therapy [facility] for tasks assessed/performed      Past Medical History:  Diagnosis Date  . Arthritis   . CAD (coronary artery disease)   . Colon polyps 01/23/2010   Descending  . Diabetes mellitus   . GI bleed   . Hypertension   . Hypotension    transient  . OSA (obstructive sleep apnea)    Severe, on CPAP therapy    Past Surgical History:  Procedure Laterality Date  . CARDIAC CATHETERIZATION  05/01/2007   Recommend rotational atherectomy followed by PTCA and probable stenting of LAD.  Marland Kitchen CARDIAC CATHETERIZATION  05/28/2007   Mid LAD hihgh grade 80-90% stenosis, stented with a 3x34mm Cypher stent implanted at 16atm, postdilated with a 3.25x41mm Quantum at 16atm, which revealed excellent wall appoistion - 5.10mm  . CARDIOVASCULAR STRESS TEST  02/05/2011   Perfusion defect seen in inferior myocardial region consistent with diagphragmatic attenuation. Remaining myocardium demonstrates normal myocardial perfusion with no evidence of ischemia or infarct. ECG is positive for ischemia.  . CPAP/BIPAP SLEEP STUDY  07/01/2007   AHI-0.5/hr at 11cm water pressure, RDI-12.6/hr  . LOWER ARTERIAL DOPPLER  06/03/2007   No evidence of dissection, AV  fistula, or active pseudoaneurysm.  Marland Kitchen SLEEP STUDY  05/13/2007   AHI-11.52/hr, AHI REM-56.0/hr, RDI-11.7/hr, RDI REM-56.0/hr, avg oxygen sat-94%  . TRANSTHORACIC ECHOCARDIOGRAM  03/13/2010   EF 123456, normal diastolic function, mild MR, mild TR, and normal RVSP    There were no vitals filed for this visit.      Subjective Assessment - 11/13/15 1448    Subjective Pt reports he is doing well. His exercises have not gone as planned. He was unable to swim over the weekend due to a wound on his foot that hqaas now resolved. His knees feel good today.    Pertinent History History of vertigo, remote history of Left knee instability/dislocation (1960's)    Currently in Pain? No/denies                         [facility] Adult PT Treatment/Exercise - 11/13/15 0001      Knee/Hip Exercises: Stretches   Quad Stretch 5 reps;Both;20 seconds  Driver stretch on 12" step 5x15sec bilat   Gastroc Stretch 3 reps;30 seconds   Gastroc Stretch Limitations slant board   Other Knee/Hip Stretches Supine knee hang c 10lbs bil, and moist heat on quads  50min     Knee/Hip Exercises: Standing   Heel Raises Both;15 reps  ROM very limited   Forward Step Up 10 reps;Step Height: 4";Both;2 sets   Step Down Both;5 reps   Step Down Limitations 2 inch box  lowered box height secondary to pain      Knee/Hip Exercises: Seated   Long Arc Quad Both;10 reps;1 set  c 10#;                   PT Short Term Goals - 10/23/15 1624      PT SHORT TERM GOAL #1   Title After 2 weeks pt will demonstrate independence and compliance in HEP.   Status New     PT SHORT TERM GOAL #2   Title After 4 weeks patient will demonstrate improved knee ROM > 110 degree bilat to improve comfort during funcitonal activities.    Status New     PT SHORT TERM GOAL #3   Title After 4 weeks patient will demonstrate improved funcitonal mobility with 5xSTS <13s and TUG test< 13sec.    Status New           PT Long Term  Goals - 10/23/15 1626      PT LONG TERM GOAL #1   Title After 7 weeks pt will demonstrate improve bilat knee flexion > 118 degrees flexion bilat.      PT LONG TERM GOAL #2   Title After 7 weeks pt will demonstrate improved funcitonal strength with 5xSTS<12s and hip abduction > 4/5 bilat.      PT LONG TERM GOAL #3   Title After 7 weeks pt will demonstrate improved balance AEB SLS> 15 seconds bilat to improve safety at homeand confidence with mobility in the community and traveling.    Status New     PT LONG TERM GOAL #4   Title After 7 weeks pt will demonstrate improved AMB speed > 1.16m/s to improve ability to travel in the airport as needed during travel without needing a WC.    Status New               Plan - 11/13/15 1458    Clinical Impression Statement Pt responding well to treatment today, no increase in pain with exercises. Pt reports some soreness in R tibialis anterior area with lateral step-ups, but otherwise exercises are tolerated well.  Pt remains somewhat limited in hip lateral stability but compensates well during movement.    Rehab Potential Good   PT Frequency 2x / week   PT Duration 8 weeks   PT Treatment/Interventions Moist Heat;Therapeutic activities;Gait training;Stair training;Functional mobility training;Therapeutic exercise;Balance training;Patient/family education;Passive range of motion;Manual techniques;Dry needling   PT Next Visit Plan Continue focus on proximal musculature strengthening including bridges, abduction heel slides, and Tband clams, if good form and compliance with additional exercises add to HEP.   PT Home Exercise Plan 10/23/15 (knee flexion stretch on steps, seated heel raises);  No additional exercises given this session.  If reports of increased compliance next session consider adding-in bridges, abduction heel slides, and Tband clams    Consulted and Agree with Plan of Care Patient      Patient will benefit from skilled therapeutic  intervention in order to improve the following deficits and impairments:  Abnormal gait, Decreased activity tolerance, Decreased balance, Decreased mobility, Decreased strength, Hypomobility, Increased edema, Difficulty walking, Decreased range of motion, Obesity, Pain, Impaired flexibility, Postural dysfunction  Visit Diagnosis: Stiffness of right knee, not elsewhere classified  Stiffness of left knee, not elsewhere classified  Unsteadiness on feet     Problem List Patient Active Problem List   Diagnosis Date Noted  . Type 2 diabetes mellitus with other circulatory complications ([place]) 0000000  . History of  gastric ulcer 05/23/2014  . OSA on CPAP 03/04/2013  . Morbid obesity ([place]) 03/04/2013  . CAD (coronary artery disease) 03/04/2013  . Hyperlipidemia with target LDL less than 70 03/04/2013  . Type 2 diabetes mellitus ([place]) 03/04/2013  . GERD (gastroesophageal reflux disease) 03/04/2013  . Vertigo 03/04/2013  . GASTRIC ULCER, ACUTE 03/28/2009    3:15 PM, 11/13/15 Etta Grandchild, PT, DPT Physical Therapist at [place] [phone] (office)     [facility] [address] [place], [place], [address] Phone: [phone]   Fax:  [phone]  Name: Jordan [name] MRN: [id number] Date of Birth: December 07, 1945

## 2015-11-15 ENCOUNTER — Ambulatory Visit (HOSPITAL_COMMUNITY): Payer: Medicare Other

## 2015-11-15 DIAGNOSIS — M25661 Stiffness of right knee, not elsewhere classified: Secondary | ICD-10-CM

## 2015-11-15 DIAGNOSIS — R2681 Unsteadiness on feet: Secondary | ICD-10-CM

## 2015-11-15 DIAGNOSIS — M25662 Stiffness of left knee, not elsewhere classified: Secondary | ICD-10-CM

## 2015-11-15 NOTE — Therapy (Signed)
[place], [place], [address] Phone: [phone]   Fax:  [phone]  Physical Therapy Treatment  Patient Details  Name: Jordan [name] MRN: [id number] Date of Birth: [birth year] Referring Provider: Stephenie Acres  Encounter Date: 11/15/2015      PT End of Session - 11/15/15 1452    Visit Number 9   Number of Visits 16   Date for PT Re-Evaluation 11/23/15   Authorization Type U[employer] Medicare   Authorization Time Period 10/23/15-12/23/15   Authorization - Visit Number 9   Authorization - Number of Visits 10   PT Start Time 2035   PT Stop Time 1515   PT Time Calculation (min) 32 min   Activity Tolerance Patient tolerated treatment well   Behavior During Therapy [facility] for tasks assessed/performed      Past Medical History:  Diagnosis Date  . Arthritis   . CAD (coronary artery disease)   . Colon polyps 01/23/2010   Descending  . Diabetes mellitus   . GI bleed   . Hypertension   . Hypotension    transient  . OSA (obstructive sleep apnea)    Severe, on CPAP therapy    Past Surgical History:  Procedure Laterality Date  . CARDIAC CATHETERIZATION  05/01/2007   Recommend rotational atherectomy followed by PTCA and probable stenting of LAD.  Marland Kitchen CARDIAC CATHETERIZATION  05/28/2007   Mid LAD hihgh grade 80-90% stenosis, stented with a 3x69m Cypher stent implanted at 16atm, postdilated with a 3.25x253mQuantum at 16atm, which revealed excellent wall appoistion - 5.7969m. CARDIOVASCULAR STRESS TEST  02/05/2011   Perfusion defect seen in inferior myocardial region consistent with diagphragmatic attenuation. Remaining myocardium demonstrates normal myocardial perfusion with no evidence of ischemia or infarct. ECG is positive for ischemia.  . CPAP/BIPAP SLEEP STUDY  07/01/2007   AHI-0.5/hr at 11cm water pressure, RDI-12.6/hr  . LOWER ARTERIAL DOPPLER  06/03/2007   No evidence of dissection, AV fistula, or active pseudoaneurysm.  .  Marland KitchenLEEP STUDY  05/13/2007   AHI-11.52/hr, AHI REM-56.0/hr, RDI-11.7/hr, RDI REM-56.0/hr, avg oxygen sat-94%  . TRANSTHORACIC ECHOCARDIOGRAM  03/13/2010   EF >55>59%ormal diastolic function, mild MR, mild TR, and normal RVSP    There were no vitals filed for this visit.      Subjective Assessment - 11/15/15 1446    Subjective Pt reports he has been working on HEP more and swam yesterday. Plans to swim today again. He reports he has some soreness in his knees intermittently while walking/transfering, but mostly painfree.    Pertinent History History of vertigo, remote history of Left knee instability/dislocation (1960's)    Currently in Pain? Yes   Pain Score 3    Pain Location Knee   Pain Orientation Right   Pain Descriptors / Indicators Aching            [facility] PT Assessment - 11/15/15 0001      PROM   Right Knee Flexion 104   Left Knee Flexion 111     Transfers   Five time sit to stand comments  10s hands free  (18.23s, hands free, at eval)   Comments TUG: TImed Up and Go   11.5s (14.28s at eval)                     [facility] 11/15/15 0001      Knee/Hip Exercises: Stretches   Quad Stretch 10 seconds  10x10x10; alternating HS  stretch, knee drive, 18" riser.   Gastroc Stretch 3 reps;30 seconds   Gastroc Stretch Limitations slant board     Knee/Hip Exercises: Seated   Long Arc Quad Both  c 10#; 3x5 bilat (try sets of 6-8 next session)     Knee/Hip Exercises: Supine   Bridges Limitations 2x10                  PT Short Term Goals - 11/15/15 1456      PT SHORT TERM GOAL #1   Title After 2 weeks pt will demonstrate independence and compliance in HEP.   Baseline Intermittent compliance, but is independent.    Status Partially Met     PT SHORT TERM GOAL #2   Title After 4 weeks patient will demonstrate improved knee ROM > 110 degree bilat to improve comfort during funcitonal activities.    Baseline 9/20: R: 104  degrees; L: 111 degrees   Status Partially Met     PT SHORT TERM GOAL #3   Title After 4 weeks patient will demonstrate improved funcitonal mobility with 5xSTS <13s and TUG test< 13sec.    Baseline 5xSTS: 10s on 9/20; TUG: 11.51s on 9/20   Status Achieved           PT Long Term Goals - 11/15/15 1506      PT LONG TERM GOAL #1   Title After 7 weeks pt will demonstrate improve bilat knee flexion > 118 degrees flexion bilat.    Status On-going     PT LONG TERM GOAL #2   Title After 7 weeks pt will demonstrate improved funcitonal strength with 5xSTS<9.25s and hip abduction > 4/5 bilat.    Status Revised     PT LONG TERM GOAL #3   Title After 7 weeks pt will demonstrate improved balance AEB SLS> 15 seconds bilat to improve safety at homeand confidence with mobility in the community and traveling.    Status On-going               Plan - 11/15/15 1454    Clinical Impression Statement Pt rrived late for today's session. Stretches are increased in intensity, with noted somewhat increased RPOM, but both knees remain very spingy and tight. Pt reporting greater compliance with physical activity and HEP with knee pain abotu the same. Making some progress toward goals overall, albeit limited.    Rehab Potential Good   PT Frequency 2x / week   PT Duration 8 weeks   PT Treatment/Interventions Moist Heat;Therapeutic activities;Gait training;Stair training;Functional mobility training;Therapeutic exercise;Balance training;Patient/family education;Passive range of motion;Manual techniques;Dry needling   PT Next Visit Plan Reassessment! Continue bridges, abduction heel slides, and Tband clams, if good form and compliance with additional exercises add to HEP.   PT Home Exercise Plan 10/23/15 (knee flexion stretch on steps, seated heel raises);  No additional exercises given this session.  If reports of increased compliance next session consider adding-in bridges, abduction heel slides, and Tband  clams    Consulted and Agree with Plan of Care Patient      Patient will benefit from skilled therapeutic intervention in order to improve the following deficits and impairments:  Abnormal gait, Decreased activity tolerance, Decreased balance, Decreased mobility, Decreased strength, Hypomobility, Increased edema, Difficulty walking, Decreased range of motion, Obesity, Pain, Impaired flexibility, Postural dysfunction  Visit Diagnosis: Stiffness of right knee, not elsewhere classified  Stiffness of left knee, not elsewhere classified  Unsteadiness on feet     Problem List Patient Active  Problem List   Diagnosis Date Noted  . Type 2 diabetes mellitus with other circulatory complications ([place]) 44/58/4835  . History of gastric ulcer 05/23/2014  . OSA on CPAP 03/04/2013  . Morbid obesity ([place]) 03/04/2013  . CAD (coronary artery disease) 03/04/2013  . Hyperlipidemia with target LDL less than 70 03/04/2013  . Type 2 diabetes mellitus ([place]) 03/04/2013  . GERD (gastroesophageal reflux disease) 03/04/2013  . Vertigo 03/04/2013  . GASTRIC ULCER, ACUTE 03/28/2009    3:17 PM, 11/15/15 Etta Grandchild, PT, DPT Physical Therapist at [place] [phone] (office)     [place] [address] [place], [place], [address] Phone: [phone]   Fax:  [phone]  Name: JUAN KISSOON MRN: [id number] Date of Birth: [birth year]

## 2015-11-17 LAB — COMPREHENSIVE METABOLIC PANEL
ALBUMIN: 3.8 g/dL (ref 3.6–5.1)
ALT: 14 U/L (ref 9–46)
AST: 16 U/L (ref 10–35)
Alkaline Phosphatase: 65 U/L (ref 40–115)
BILIRUBIN TOTAL: 0.6 mg/dL (ref 0.2–1.2)
BUN: 9 mg/dL (ref 7–25)
CALCIUM: 9 mg/dL (ref 8.6–10.3)
CHLORIDE: 103 mmol/L (ref 98–110)
CO2: 29 mmol/L (ref 20–31)
CREATININE: 0.87 mg/dL (ref 0.70–1.18)
Glucose, Bld: 127 mg/dL — ABNORMAL HIGH (ref 65–99)
Potassium: 4.2 mmol/L (ref 3.5–5.3)
Sodium: 139 mmol/L (ref 135–146)
TOTAL PROTEIN: 7 g/dL (ref 6.1–8.1)

## 2015-11-17 LAB — LIPID PANEL
CHOLESTEROL: 114 mg/dL — AB (ref 125–200)
HDL: 42 mg/dL (ref >=40)
LDL CALC: 56 mg/dL (ref <130)
TRIGLYCERIDES: 81 mg/dL (ref <150)
Total CHOL/HDL Ratio: 2.7 Ratio (ref <=5.0)
VLDL: 16 mg/dL (ref <30)

## 2015-11-20 ENCOUNTER — Ambulatory Visit (HOSPITAL_COMMUNITY): Payer: Medicare Other

## 2015-11-20 DIAGNOSIS — M25661 Stiffness of right knee, not elsewhere classified: Secondary | ICD-10-CM | POA: Diagnosis not present

## 2015-11-20 DIAGNOSIS — R2681 Unsteadiness on feet: Secondary | ICD-10-CM

## 2015-11-20 DIAGNOSIS — M25662 Stiffness of left knee, not elsewhere classified: Secondary | ICD-10-CM

## 2015-11-20 NOTE — Therapy (Signed)
Jordan [name], [place], [address] Phone: [phone]   Fax:  [phone]  Physical Therapy Treatment  Patient Details  Name: Jordan [name] MRN: [id number] Date of Birth: Mar 13, 1945 Referring Provider: Stephenie [name]   Encounter Date: December 14, 2015      PT End of Session - 2015/12/14 1517    Visit Number 10   Number of Visits 16   Date for PT Re-Evaluation 11/23/15   Authorization Type UHC Medicare   Authorization Time Period 16-Nov-2015-16-Jan-2016 (G-codes done on 12-14-2022)   Authorization - Visit Number 10   Authorization - Number of Visits 20   PT Start Time 5400   PT Stop Time 1516   PT Time Calculation (min) 38 min   Activity Tolerance Patient tolerated treatment well   Behavior During Therapy [facility] for tasks assessed/performed      Past Medical History:  Diagnosis Date  . Arthritis   . CAD (coronary artery disease)   . Colon polyps 01/23/2010   Descending  . Diabetes mellitus   . GI bleed   . Hypertension   . Hypotension    transient  . OSA (obstructive sleep apnea)    Severe, on CPAP therapy    Past Surgical History:  Procedure Laterality Date  . CARDIAC CATHETERIZATION  05/01/2007   Recommend rotational atherectomy followed by PTCA and probable stenting of LAD.  Marland Kitchen CARDIAC CATHETERIZATION  05/28/2007   Mid LAD hihgh grade 80-90% stenosis, stented with a 3x41m Cypher stent implanted at 16atm, postdilated with a 3.25x242mQuantum at 16atm, which revealed excellent wall appoistion - 5.7977m. CARDIOVASCULAR STRESS TEST  02/05/2011   Perfusion defect seen in inferior myocardial region consistent with diagphragmatic attenuation. Remaining myocardium demonstrates normal myocardial perfusion with no evidence of ischemia or infarct. ECG is positive for ischemia.  . CPAP/BIPAP SLEEP STUDY  07/01/2007   AHI-0.5/hr at 11cm water pressure, RDI-12.6/hr  . LOWER ARTERIAL DOPPLER  06/03/2007   No evidence of dissection, AV fistula, or  active pseudoaneurysm.  . SMarland KitchenEEP STUDY  05/13/2007   AHI-11.52/hr, AHI REM-56.0/hr, RDI-11.7/hr, RDI REM-56.0/hr, avg oxygen sat-94%  . TRANSTHORACIC ECHOCARDIOGRAM  03/13/2010   EF >55>86%ormal diastolic function, mild MR, mild TR, and normal RVSP    There were no vitals filed for this visit.      Subjective Assessment - 11/04/17/1741    Subjective Pt reports he thinks he is making some progres with PT. He says that he is more compliant with HEP. He notices that his steps to enter the house at the garage is easier.    Pertinent History History of vertigo, remote history of Left knee instability/dislocation (1960's)    How long can you sit comfortably? None, some limitations imposed from hip/pelvis pain    How long can you stand comfortably? Able to tolerate about 15 minutes with soem fatgiue (10-15 minutes per bout at eval due to pain)   How long can you walk comfortably? Pt reports he was ale to tolerate 10-15 minutes [place]; does not walk a lot but does swim 4 days weekly)   Diagnostic tests Xrays   Patient Stated Goals Walking beter and greater ease with going up/down steps   Currently in Pain? No/denies            [facility] - 09/October 19, 201701      Assessment   Medical Diagnosis Bilat Knee OA    Referring Provider SeaStephenie [name]  Onset Date/Surgical Date --  ~1year ago   Hand Dominance Right   Next MD Visit December     Balance Screen   Has the patient fallen in the past 6 months No   Has the patient had a decrease in activity level because of a fear of falling?  No   Is the patient reluctant to leave their home because of a fear of falling?  No     PROM   Right Knee Flexion --  95* at eval and 104* on 9/20   Left Knee Flexion --  106* at eval and 111* on 9/20     Strength   Right Hip Flexion 5/5   Right Hip Extension 5/5  (standing) 3+/5 at eval   Right Hip External Rotation  5/5   Right Hip Internal Rotation 5/5   Right Hip ABduction  4/5  (3+/5 at eval) *sidelying   Right Hip ADduction 5/5  (4/5 at eval)   Left Hip Flexion 5/5   Left Hip Extension 4/5  standing    Left Hip External Rotation 5/5   Left Hip Internal Rotation 5/5   Left Hip ABduction 4+/5  (3+/5 at eval) TFL dominant *sidelying   Left Hip ADduction 5/5   Right Knee Flexion 5/5   Right Knee Extension 5/5   Left Knee Flexion 5/5   Left Knee Extension 5/5   Right Ankle Dorsiflexion 5/5   Right Ankle Plantar Flexion 2/5  (1/5 at eval) seated soleus   Left Ankle Dorsiflexion 5/5   Left Ankle Plantar Flexion --  (2-/5 at eval) seated soleus     Transfers   Five time sit to stand comments  10s hands free (9/20)  (18.23s, hands free, at eval)   Comments TUG: TImed Up and Go (9/20)  11.5s (14.28s at eval)     Ambulation/Gait   Ambulation Distance (Feet) 450 Feet   Assistive device None   Gait velocity 1.46ms  (0.856m at evaluation)     Balance   Balance Assessed Yes     Static Standing Balance   Static Standing Balance -  Activities  Single Leg Stance - Right Leg;Single Leg Stance - Left Leg  R: 5.14s; L:<2s                               PT Short Term Goals - 11/20/15 1509      PT SHORT TERM GOAL #1   Title After 2 weeks pt will demonstrate independence and compliance in HEP.   Baseline Intermittent compliance, but is independent.    Status Partially Met     PT SHORT TERM GOAL #2   Title After 4 weeks patient will demonstrate improved knee ROM > 110 degree bilat to improve comfort during funcitonal activities.    Baseline 9/20: R: 104 degrees; L: 111 degrees   Status Partially Met     PT SHORT TERM GOAL #3   Title After 4 weeks patient will demonstrate improved funcitonal mobility with 5xSTS <13s and TUG test< 13sec.    Baseline 5xSTS: 10s on 9/20; TUG: 11.51s on 9/20   Status Achieved           PT Long Term Goals - 11/20/15 1510      PT LONG TERM GOAL #1   Title After 7 weeks pt will demonstrate  improve bilat knee flexion > 115 degrees flexion bilat.    Status Revised  PT LONG TERM GOAL #2   Title After 7 weeks pt will demonstrate improved funcitonal strength with 5xSTS<9.25s and hip abduction > 4/5 bilat.    Status On-going     PT LONG TERM GOAL #3   Title After 7 weeks pt will demonstrate improved balance AEB SLS> 15 seconds bilat to improve safety at homeand confidence with mobility in the community and traveling.    Baseline Remains less than 6s bilat at reassessment   Status On-going     PT LONG TERM GOAL #4   Title After 7 weeks pt will demonstrate improved AMB speed > 1.78ms to improve ability to travel in the airport as needed during travel without needing a WC.                Plan - 0Oct 03, 20171609    Clinical Impression Statement Reassessment performed today. Pt demonstrating achievement of 1 goal and parital achievement of two others. Pt showing improved knee flexion ROM bilat, improved strength in MMT, improved gait speed, imrproved functional strength AEB 5x STS time. Pt is gradually improving compliance with HEP and ready for updates. Progress toward SLS balance remains very limted, but I suspect this is related to chronic weakness in the hip abductors which is supported by gait observation.     Rehab Potential Good   PT Frequency 2x / week   PT Duration 8 weeks   PT Treatment/Interventions Moist Heat;Therapeutic activities;Gait training;Stair training;Functional mobility training;Therapeutic exercise;Balance training;Patient/family education;Passive range of motion;Manual techniques;Dry needling   PT Next Visit Plan Reassessment: Continue bridges, abduction heel slides, and Tband clams, (add to HEP); add in semi-tandem balance 10x10sec. Review walking program.    PT Home Exercise Plan 10/23/15 (knee flexion stretch on steps, seated heel raises);  Asked to begin walking 5 minutes, 2x daily, 7 days/week.    Consulted and Agree with Plan of Care Patient       Patient will benefit from skilled therapeutic intervention in order to improve the following deficits and impairments:  Abnormal gait, Decreased activity tolerance, Decreased balance, Decreased mobility, Decreased strength, Hypomobility, Increased edema, Difficulty walking, Decreased range of motion, Obesity, Pain, Impaired flexibility, Postural dysfunction  Visit Diagnosis: Stiffness of right knee, not elsewhere classified  Stiffness of left knee, not elsewhere classified  Unsteadiness on feet       G-Codes - [id number]    Functional Assessment Tool Used Clinical Judgment   Functional Limitation Mobility: Walking and moving around   Mobility: Walking and Moving Around Current Status [phone] At least 20 percent but less than 40 percent impaired, limited or restricted   Mobility: Walking and Moving Around Goal Status [phone] At least 1 percent but less than 20 percent impaired, limited or restricted      Problem List Patient Active Problem List   Diagnosis Date Noted  . Type 2 diabetes mellitus with other circulatory complications ([place] 042/87/6811 . History of gastric ulcer 05/23/2014  . OSA on CPAP 03/04/2013  . Morbid obesity ([place] 03/04/2013  . CAD (coronary artery disease) 03/04/2013  . Hyperlipidemia with target LDL less than 70 03/04/2013  . Type 2 diabetes mellitus ([place] 03/04/2013  . GERD (gastroesophageal reflux disease) 03/04/2013  . Vertigo 03/04/2013  . GASTRIC ULCER, ACUTE 03/28/2009    4:16 PM, 010/03/2017AEtta Grandchild PT, DPT Physical Therapist at [phone](office)     [place] [address]Phone: [phone]  Fax:  [phone]  Name: LEX LINHARES MRN: [id number] Date of Birth: 1946-01-08

## 2015-11-22 ENCOUNTER — Ambulatory Visit (HOSPITAL_COMMUNITY): Payer: Medicare Other

## 2015-11-22 DIAGNOSIS — R2681 Unsteadiness on feet: Secondary | ICD-10-CM

## 2015-11-22 DIAGNOSIS — M25661 Stiffness of right knee, not elsewhere classified: Secondary | ICD-10-CM

## 2015-11-22 DIAGNOSIS — M25662 Stiffness of left knee, not elsewhere classified: Secondary | ICD-10-CM

## 2015-11-22 NOTE — Therapy (Signed)
[place], [place], [address] Phone: [phone]   Fax:  [phone]  Physical Therapy Treatment  Patient Details  Name: Jordan [name] MRN: [id number] Date of Birth: 14-Feb-1946 Referring Provider: Stephenie Acres   Encounter Date: 11/22/2015      PT End of Session - 11/22/15 1452    Visit Number 11   Number of Visits 16   Date for PT Re-Evaluation 11/23/15   Authorization Type UHC Medicare   Authorization Time Period 11-16-15-Jan 16, 2016 (G-codes done on Dec 14, 2022)   Authorization - Visit Number 11   Authorization - Number of Visits 20   PT Start Time 1435   PT Stop Time 1513   PT Time Calculation (min) 38 min   Activity Tolerance Patient tolerated treatment well   Behavior During Therapy [facility] for tasks assessed/performed      Past Medical History:  Diagnosis Date  . Arthritis   . CAD (coronary artery disease)   . Colon polyps 01/23/2010   Descending  . Diabetes mellitus   . GI bleed   . Hypertension   . Hypotension    transient  . OSA (obstructive sleep apnea)    Severe, on CPAP therapy    Past Surgical History:  Procedure Laterality Date  . CARDIAC CATHETERIZATION  05/01/2007   Recommend rotational atherectomy followed by PTCA and probable stenting of LAD.  Marland Kitchen CARDIAC CATHETERIZATION  05/28/2007   Mid LAD hihgh grade 80-90% stenosis, stented with a 3x35m Cypher stent implanted at 16atm, postdilated with a 3.25x253mQuantum at 16atm, which revealed excellent wall appoistion - 5.7924m. CARDIOVASCULAR STRESS TEST  02/05/2011   Perfusion defect seen in inferior myocardial region consistent with diagphragmatic attenuation. Remaining myocardium demonstrates normal myocardial perfusion with no evidence of ischemia or infarct. ECG is positive for ischemia.  . CPAP/BIPAP SLEEP STUDY  07/01/2007   AHI-0.5/hr at 11cm water pressure, RDI-12.6/hr  . LOWER ARTERIAL DOPPLER  06/03/2007   No evidence of dissection, AV fistula, or  active pseudoaneurysm.  . SMarland KitchenEEP STUDY  05/13/2007   AHI-11.52/hr, AHI REM-56.0/hr, RDI-11.7/hr, RDI REM-56.0/hr, avg oxygen sat-94%  . TRANSTHORACIC ECHOCARDIOGRAM  03/13/2010   EF >55>21%ormal diastolic function, mild MR, mild TR, and normal RVSP    There were no vitals filed for this visit.      Subjective Assessment - 11/22/15 1436    Subjective Pt doing well today. His knees still somewhat achy after walking, but HEP is going well. He just trimmed his mustache.    Pertinent History History of vertigo, remote history of Left knee instability/dislocation (1960's)    Currently in Pain? No/denies                         [facility]Exercise - 11/22/15 0001      Knee/Hip Exercises: Stretches   Quad Stretch 10 seconds  10x10 alternating HS stretch, knee drive, 14" riser.   Gastroc Stretch 30 seconds;2 reps   Gastroc Stretch Limitations slant board     Knee/Hip Exercises: Standing   Heel Raises Both;10 reps;2 sets  ROM very limited   Forward Step Up Both;2 sets;Step Height: 6";Hand Hold: 2  2x8, 6" step bilat   Step Down Both;5 sets;3 sets;Step Height: 4"                  PT Short Term Goals - 09/10-19-1709      PT SHORT TERM GOAL #1  Title After 2 weeks pt will demonstrate independence and compliance in HEP.   Baseline Intermittent compliance, but is independent.    Status Partially Met     PT SHORT TERM GOAL #2   Title After 4 weeks patient will demonstrate improved knee ROM > 110 degree bilat to improve comfort during funcitonal activities.    Baseline 9/20: R: 104 degrees; L: 111 degrees   Status Partially Met     PT SHORT TERM GOAL #3   Title After 4 weeks patient will demonstrate improved funcitonal mobility with 5xSTS <13s and TUG test< 13sec.    Baseline 5xSTS: 10s on 9/20; TUG: 11.51s on 9/20   Status Achieved           PT Long Term Goals - 11/20/15 1510      PT LONG TERM GOAL #1   Title After 7 weeks pt will  demonstrate improve bilat knee flexion > 115 degrees flexion bilat.    Status Revised     PT LONG TERM GOAL #2   Title After 7 weeks pt will demonstrate improved funcitonal strength with 5xSTS<9.25s and hip abduction > 4/5 bilat.    Status On-going     PT LONG TERM GOAL #3   Title After 7 weeks pt will demonstrate improved balance AEB SLS> 15 seconds bilat to improve safety at homeand confidence with mobility in the community and traveling.    Baseline Remains less than 6s bilat at reassessment   Status On-going     PT LONG TERM GOAL #4   Title After 7 weeks pt will demonstrate improved AMB speed > 1.55ms to improve ability to travel in the airport as needed during travel without needing a WC.                Plan - 11/22/15 1456    Clinical Impression Statement Pt tolerating session well today, able to increase step height on stepping exercises, and without increase in pain in both knees. Additional hip strengthening and balance activity added as well. Pt continues to make goals toward improving functional strength, mobility, and balance.    Rehab Potential Good   PT Frequency 2x / week   PT Duration 8 weeks   PT Treatment/Interventions Moist Heat;Therapeutic activities;Gait training;Stair training;Functional mobility training;Therapeutic exercise;Balance training;Patient/family education;Passive range of motion;Manual techniques;Dry needling   PT Next Visit Plan Reassessment: Continue bridges, abduction heel slides, and Tband clams, (add to HEP); add in semi-tandem balance 10x10sec. Review walking program.    PT Home Exercise Plan 10/23/15 (knee flexion stretch on steps, seated heel raises);  Asked to begin walking 5 minutes, 2x daily, 7 days/week. (increase by 1 minute each week); 9/27: tandem stance 3x30s   Consulted and Agree with Plan of Care Patient      Patient will benefit from skilled therapeutic intervention in order to improve the following deficits and impairments:   Abnormal gait, Decreased activity tolerance, Decreased balance, Decreased mobility, Decreased strength, Hypomobility, Increased edema, Difficulty walking, Decreased range of motion, Obesity, Pain, Impaired flexibility, Postural dysfunction  Visit Diagnosis: Stiffness of right knee, not elsewhere classified  Stiffness of left knee, not elsewhere classified  Unsteadiness on feet     Problem List Patient Active Problem List   Diagnosis Date Noted  . Type 2 diabetes mellitus with other circulatory complications ([place] 067/34/1937 . History of gastric ulcer 05/23/2014  . OSA on CPAP 03/04/2013  . Morbid obesity ([place] 03/04/2013  . CAD (coronary artery disease) 03/04/2013  . Hyperlipidemia with  target LDL less than 70 03/04/2013  . Type 2 diabetes mellitus ([place]) 03/04/2013  . GERD (gastroesophageal reflux disease) 03/04/2013  . Vertigo 03/04/2013  . GASTRIC ULCER, ACUTE 03/28/2009    3:18 PM, 11/22/15 Etta Grandchild, PT, DPT Physical Therapist at [facility] [phone] (office)     [place] [address] [place], [place], [address] Phone: [phone]   Fax:  [phone]  Name: KEJUAN BEKKER MRN: [id number] Date of Birth: Jun 10, 1945

## 2015-11-23 ENCOUNTER — Encounter: Payer: Self-pay | Admitting: Cardiovascular Disease

## 2015-11-23 ENCOUNTER — Ambulatory Visit (INDEPENDENT_AMBULATORY_CARE_PROVIDER_SITE_OTHER): Payer: Medicare Other | Admitting: Cardiovascular Disease

## 2015-11-23 VITALS — BP 116/72 | HR 52 | Ht 66.0 in | Wt 297.5 lb

## 2015-11-23 DIAGNOSIS — E785 Hyperlipidemia, unspecified: Secondary | ICD-10-CM | POA: Diagnosis not present

## 2015-11-23 DIAGNOSIS — E1159 Type 2 diabetes mellitus with other circulatory complications: Secondary | ICD-10-CM | POA: Diagnosis not present

## 2015-11-23 DIAGNOSIS — G4733 Obstructive sleep apnea (adult) (pediatric): Secondary | ICD-10-CM

## 2015-11-23 NOTE — Patient Instructions (Signed)

## 2015-11-24 ENCOUNTER — Telehealth (HOSPITAL_COMMUNITY): Payer: Self-pay | Admitting: Occupational Therapy

## 2015-11-24 NOTE — Telephone Encounter (Signed)
Called and left message for pt canceling appt for 10/2 due to [place] covering acute for [employer]. Reminded of next appt on 10/4.   Guadelupe Sabin, OTR/L  [phone] 11/24/2015

## 2015-11-25 NOTE — Progress Notes (Signed)
Patient ID: Jordan [name], male   DOB: 1945-06-17, 70 y.o.   MRN: [id number]   Primary MD: [name]  HPI: Jordan [name] is a 70 y.o. male who presents to the office today for a 3 month cardiology evaluation.  Jordan [name] is a 14 -year-old African-American American gentleman who has a history of CAD , severe obstructive sleep apnea, morbid obesity, type 2 diabetes mellitus, mild essential hypertension, GERD, as well as erectile dysfunction. In April 2009 he was found to have high-grade focally calcified LAD stenosis and underwent successful high-speed rotational atherectomy and DES stenting with insertion of a 3.033 mm Cypher stent.  He has continued to be on long-term antiplatelet therapy with aspirin and Plavix.  He continues to be active.  He denies any recurrent anginal type symptomatology. His nuclear stress test in 2012 continue to suggest patency of this vessel with normal perfusion.  A two-year followup nuclear perfusion study  on 01/28/2013 demonstrated  normal perfusion without scar or ischemia;  EF was 56%.  He has a history of severe  obstructive sleep apnea originally diagnosed in 2009 and he has been on CPAP therapy since that time with 100% compliance.  He recently received a new CPAP machine after his old machine had begun to malfunction.  He was recently placed on a nasal mask and is followed by Dr.Domeiher.  He has a history of hyperlipidemia and when last seen had begun to have issues with simvastatin causing some myalgias.  I suggest that he change this to Crestor and he is now been on a very low-dose at 5 mg and has been taking this 2 times per week.  He has tolerated this.  Repeat blood work in May 2017 showed a total cholesterol of 153, triglycerides 80, HDL 45, and LDL 92.  An echo Doppler study on 05/11/2015 showed an ejection fraction at 55%; there were no regional wall motion abnormalities but there was  grade 2 diastolic dysfunction.  There was mitral annular calcification without  regurgitation.  He normal pulmonic pressures.  His aortic valve was normal.  Since I last saw him, he denies any episodes, recurrent of recurrent chest pain.  He is bothered by bilateral knee discomfort for which she has undergone several injections as well as physical therapy for improvement.  He has  a history of morbid obesity, but  not been very successful with weight loss.    He has a history of diabetes mellitus  And continues to be on metformin and also had pertussis patent study at [facility].  He has a history of erectile dysfunction and has taken Viagra as needed and typically takes this once every 3-4 weeks.  He continues to swim, typically doing doggy paddle and swims for half-mile, which takes approximately one hour.  He denies chest pain, PND orthopnea.  He denies awareness of palpitations.  He presents for reevaluation.   Past Medical History:  Diagnosis Date  . Arthritis   . CAD (coronary artery disease)   . Colon polyps 01/23/2010   Descending  . Diabetes mellitus   . GI bleed   . Hypertension   . Hypotension    transient  . OSA (obstructive sleep apnea)    Severe, on CPAP therapy    Past Surgical History:  Procedure Laterality Date  . CARDIAC CATHETERIZATION  05/01/2007   Recommend rotational atherectomy followed by PTCA and probable stenting of LAD.  Marland Kitchen CARDIAC CATHETERIZATION  05/28/2007   Mid LAD hihgh grade 80-90% stenosis,  stented with a 3x85m Cypher stent implanted at 16atm, postdilated with a 3.25x259mQuantum at 16atm, which revealed excellent wall appoistion - 5.7969m. CARDIOVASCULAR STRESS TEST  02/05/2011   Perfusion defect seen in inferior myocardial region consistent with diagphragmatic attenuation. Remaining myocardium demonstrates normal myocardial perfusion with no evidence of ischemia or infarct. ECG is positive for ischemia.  . CPAP/BIPAP SLEEP STUDY  07/01/2007   AHI-0.5/hr at 11cm water pressure, RDI-12.6/hr  . LOWER ARTERIAL DOPPLER  06/03/2007   No  evidence of dissection, AV fistula, or active pseudoaneurysm.  . SMarland KitchenEEP STUDY  05/13/2007   AHI-11.52/hr, AHI REM-56.0/hr, RDI-11.7/hr, RDI REM-56.0/hr, avg oxygen sat-94%  . TRANSTHORACIC ECHOCARDIOGRAM  03/13/2010   EF >55>54%ormal diastolic function, mild MR, mild TR, and normal RVSP    Allergies  Allergen Reactions  . Codeine     Current Outpatient Prescriptions  Medication Sig Dispense Refill  . acetaminophen (TYLENOL 8 HOUR) 650 MG CR tablet Take 650 mg by mouth every 8 (eight) hours as needed.      . AMarland Kitchenoaequorin (PREVAGEN PO) Take 20 mg by mouth daily.    . aMarland Kitchenpirin 81 MG tablet Take 81 mg by mouth daily.      . carvedilol (COREG) 3.125 MG tablet TAKE (1) TABLET BY MOUTH TWICE DAILY WITH MEALS. 180 tablet 2  . Cholecalciferol (VITAMIN D3 PO) Take 2,000 Units by mouth daily.    . CMarland KitchenNNAMON PO Take 1 tablet by mouth 2 (two) times daily. 1000 mg tablets    . clopidogrel (PLAVIX) 75 MG tablet TAKE ONE TABLET BY MOUTH ONCE DAILY. 90 tablet 3  . fluticasone (CUTIVATE) 0.05 % cream Apply 1 application topically daily.     . fluticasone (FLONASE) 50 MCG/ACT nasal spray Place 1 spray into both nostrils 2 (two) times daily.    . Garlic Oil [id number] CAPS Take by mouth. 1 time daily      . glucosamine-chondroitin 500-400 MG tablet Take 2 tablets by mouth daily.     . kMarland Kitchentoconazole (NIZORAL) 2 % cream Apply 1 application topically daily.    . lMarland Kitchensinopril (PRINIVIL,ZESTRIL) 5 MG tablet Take 0.5 tablets (2.5 mg total) by mouth daily. 90 tablet 2  . metFORMIN (GLUCOPHAGE-XR) 500 MG 24 hr tablet Take 500 mg by mouth daily with breakfast.      . Misc Natural Products (TART CHERRY ADVANCED PO) Take 433 mg by mouth daily.    . mupirocin ointment (BACTROBAN) 2 % Place 1 application into the nose 2 (two) times daily.    . NON FORMULARY Beta Prostate. Take 1 capsule by mouth twice daily.    . NON FORMULARY CPAP    . Nutritional Supplements (NUTRITIONAL SUPPLEMENT PO) Take 3 tablets by mouth daily. Pt takes  Enhanced Oral Chelation II    . Omega-3 Fatty Acids (FISH OIL) 1000 MG CAPS Take by mouth. 1 by mouth 2 times daily      . orlistat (ALLI) 60 MG capsule Take 60 mg by mouth 2 (two) times daily.    . pantoprazole (PROTONIX) 40 MG tablet Take 1 tablet (40 mg total) by mouth daily. 90 tablet 3  . rosuvastatin (CRESTOR) 5 MG tablet 1 tablet 3 times/ week. If tolerated then increase to every other day. 30 tablet 5  . sildenafil (VIAGRA) 50 MG tablet Take 100 mg by mouth daily as needed.     . UMarland KitchenABLE TO FIND Take 2 tablets by mouth daily. Pt takes [employer]    . vitamin B-12 (CYANOCOBALAMIN)  500 MCG tablet Take 1,000 mcg by mouth daily.    . vitamin C (ASCORBIC ACID) 500 MG tablet Take 1,000 mg by mouth 2 (two) times daily.     . vitamin E 400 UNIT capsule Take 400 Units by mouth daily.    Marland Kitchen ZOSTAVAX 94765 UNT/0.65ML injection   0   No current facility-administered medications for this visit.     Social History   Social History  . Marital status: Married    Spouse name: N/A  . Number of children: 2  . Years of education: N/A   Occupational History  . retired    Social History Main Topics  . Smoking status: Former Smoker    Quit date: 01/28/1978  . Smokeless tobacco: Never Used  . Alcohol use Yes     Comment: rarely  . Drug use: No  . Sexual activity: Not on file   Other Topics Concern  . Not on file   Social History Narrative  . No narrative on file   Socially he is married and has 2 children. No tobacco or alcohol use.  Family History  Problem Relation Age of Onset  . Stroke Brother   . Hypertension Brother   . Alzheimer's disease Maternal Grandmother   . Stroke Maternal Grandfather   . Heart disease Maternal Grandfather   . Stroke Paternal Grandmother   . Heart attack Paternal Grandfather   . Hypertension Father   . Alzheimer's disease Father   . Parkinson's disease Father   . Alzheimer's disease Mother     ROS General: Negative; No fevers, chills, or night  sweats; positive for minimal weight loss HEENT: Negative; No changes in vision or hearing, sinus congestion, difficulty swallowing Pulmonary: Negative; No cough, wheezing, shortness of breath, hemoptysis Cardiovascular: Negative; No chest pain, presyncope, syncope, palpitations GI: Negative; No nausea, vomiting, diarrhea, or abdominal pain GU: Positive for erectile dysfunction; No dysuria, hematuria, or difficulty voiding Musculoskeletal: Negative; no myalgias, joint pain, or weakness Hematologic/Oncology: Negative; no easy bruising, bleeding Endocrine: Negative; no heat/cold intolerance; no diabetes Neuro: Negative; no changes in balance, headaches Skin: Negative; No rashes or skin lesions Psychiatric: Negative; No behavioral problems, deprression. Sleep: Positive for obstructive sleep apnea which is severe, currently on CPAP with 100% compliance. Other comprehensive 14 point system review is negative.  PE BP 116/72 (BP Location: Left Arm, Patient Position: Sitting, Cuff Size: Large)   Pulse (!) 52   Ht '5\' 6"'  (1.676 m)   Wt 297 lb 8 oz (134.9 kg)   BMI 48.02 kg/m    Repeat blood pressure by me was 110/70  Wt Readings from Last 3 Encounters:  11/23/15 297 lb 8 oz (134.9 kg)  08/07/15 296 lb (134.3 kg)  06/27/15 296 lb (134.3 kg)   General: Alert, oriented, no distress; morbidly obese.  Skin: normal turgor, no rashes HEENT: Normocephalic, atraumatic. Pupils round and reactive; sclera anicteric;no lid lag. Extraocular muscles intact. Nose without nasal septal hypertrophy Mouth/Parynx benign; Mallinpatti scale 3 Neck: No JVD, no carotid bruits; normal carotid upstroke Lungs: clear to ausculatation and percussion; no wheezing or rales Chest wall: no tenderness to palpitation Heart: RRR, s1 s2 normal 1/6 sem; no S3 gallop.  No diastolic murmur rubs thrills or heaves Abdomen: Significant central adiposity; soft, nontender; no hepatosplenomehaly, BS+; abdominal aorta nontender and not  dilated by palpation. Back: no CVA tenderness Pulses 2+ Extremities: no clubbing cyanosis or edema, Homan's sign negative  Neurologic: grossly nonfocal; cranial nerves grossly normal. Psychologic: normal affect and mood.  ECG (independently read by me): Sinus bradycardia 52 bpm.  No significant ST changes.  Normal intervals.  June 2017 ECG (independently read by me): Sinus bradycardia 50 bpm.  No ectopy.  February 2017 ECG (independently read by me):  Sinus bradycardia with occasional PVC.  Heart rate 55 bpm.  QTc interval 369 ms.  February 2016ECG (independently read by me): Sinus bradycardia 50 bpm.  No ectopy.  Normal intervals.   LABS:  BMP Latest Ref Rng & Units 11/17/2015 01/25/2009 01/24/2009  Glucose 65 - 99 mg/dL 127(H) 95 109(H)  BUN 7 - 25 mg/dL 9 16 39(H)  Creatinine 0.70 - 1.18 mg/dL 0.87 0.73 0.75  Sodium 135 - 146 mmol/L 139 135 138  Potassium 3.5 - 5.3 mmol/L 4.2 3.5 DELTA CHECK NOTED REPEATED TO VERIFY 4.3  Chloride 98 - 110 mmol/L 103 108 110  CO2 20 - 31 mmol/L '29 25 24  ' Calcium 8.6 - 10.3 mg/dL 9.0 7.8(L) 8.3(L)   Hepatic Function Latest Ref Rng & Units 11/17/2015 06/30/2015 01/23/2009  Total Protein 6.1 - 8.1 g/dL 7.0 6.9 5.9(L)  Albumin 3.6 - 5.1 g/dL 3.8 3.9 2.9(L)  AST 10 - 35 U/L '16 13 18  ' ALT 9 - 46 U/L '14 14 21  ' Alk Phosphatase 40 - 115 U/L 65 69 48  Total Bilirubin 0.2 - 1.2 mg/dL 0.6 0.5 0.6  Bilirubin, Direct <=0.2 mg/dL - 0.1 0.1   CBC Latest Ref Rng & Units 03/28/2009 01/26/2009 01/25/2009  WBC 4.5 - 10.5 10*3/microliter 10.3 10.8(H) 11.8(H)  Hemoglobin 13.0 - 17.0 g/dL 13.3 8.4(L) 8.6(L)  Hematocrit 39.0 - 52.0 % 40.9 24.8(L) 25.3(L)  Platelets 150.0 - 400.0 K/uL 234.0 190 184   Lab Results  Component Value Date   MCV 90.6 03/28/2009   MCV 91.6 01/26/2009   MCV 90.8 01/25/2009   No results found for: TSH   No results found for: HGBA1C  Lipid Panel     Component Value Date/Time   CHOL 114 (L) 11/17/2015 0749   TRIG 81 11/17/2015 0749    HDL 42 11/17/2015 0749   CHOLHDL 2.7 11/17/2015 0749   VLDL 16 11/17/2015 0749   LDLCALC 56 11/17/2015 0749     RADIOLOGY: No results found.    ASSESSMENT AND PLAN: Jordan [name] is a 70 year-old African-American male who Has a history of morbid obesity, hypertension, obstructive sleep apnea, and CAD.  He underwent high-speed rotational artherectomy of the severely calcified focal LAD stenosis with insertion of a 3.0x30 mm DES Cypher stent. Clinically, he continues to do  well with reference to his coronary obstructive disease without recurrent anginal symptoms. His last nuclear perfusion study  suggests patency of this vessel with normal perfusion.  He is noticed no change in exercise tolerance.  He continues to swim several days per week  for an hour, usually doing the doggie paddle. With reference to his obstructive sleep apnea, and he has been on therapy since 2009.  Due to machine malfunction, he received a new CPAP machine and is now using a nasal mask.  He admits to 100% compliance.  He denies breakthrough snoring or residual daytime sleepiness.  His blood pressure today is stable on his reduced dose of  lisinopril 2.5 mg daily and carvedilol 3.125 mg twice a day.    He is diabetic on metformin. With his CAD and diabetes mellitus target LDL is less than 70.  When I saw him earlier this year.  I changed his simvastatin to Crestor and he is  tolerating the 5 mg dose.  Blood work one week ago showed an excellent LDL at 56 with a total cholesterol 114.  His BMI is 48 and is consistent with morbid obesity.  Loss was recommended.  He has had bilateral knee discomfort and is undergoing physical therapy and also had received several injections.  He continues to be on dual antiplatelet therapy for CAD and is tolerating this well without bleeding.  As long as he remains stable, I will see him in 6 months for reevaluation.  Time spent: 25 minutes  Troy Sine, MD, [facility]  11/25/2015 1:23 PM

## 2015-11-27 ENCOUNTER — Ambulatory Visit (HOSPITAL_COMMUNITY): Payer: Medicare Other

## 2015-11-29 ENCOUNTER — Ambulatory Visit (HOSPITAL_COMMUNITY): Payer: Medicare Other | Attending: Diagnostic Radiology

## 2015-11-29 DIAGNOSIS — M25662 Stiffness of left knee, not elsewhere classified: Secondary | ICD-10-CM | POA: Insufficient documentation

## 2015-11-29 DIAGNOSIS — M25661 Stiffness of right knee, not elsewhere classified: Secondary | ICD-10-CM | POA: Insufficient documentation

## 2015-11-29 DIAGNOSIS — R2681 Unsteadiness on feet: Secondary | ICD-10-CM | POA: Diagnosis present

## 2015-11-29 NOTE — Therapy (Signed)
[place], [place], [address] Phone: [phone]   Fax:  [phone]  Physical Therapy Treatment  Patient Details  Name: BENJAMAN ARTMAN MRN: [id number] Date of Birth: [birth year] Referring Provider: Stephenie Acres   Encounter Date: 11/29/2015      PT End of Session - 11/29/15 1456    Visit Number 12   Number of Visits 16   Date for PT Re-Evaluation 11/23/15   Authorization Type [employer] Medicare   Authorization Time Period 11-06-2015-01/06/16 (G-codes done on 12/04/2022)   Authorization - Visit Number 12   Authorization - Number of Visits 20   PT Start Time 3474   PT Stop Time 1515   PT Time Calculation (min) 39 min   Equipment Utilized During Treatment Gait belt   Activity Tolerance Patient tolerated treatment well   Behavior During Therapy [facility] for tasks assessed/performed      Past Medical History:  Diagnosis Date  . Arthritis   . CAD (coronary artery disease)   . Colon polyps 01/23/2010   Descending  . Diabetes mellitus   . GI bleed   . Hypertension   . Hypotension    transient  . OSA (obstructive sleep apnea)    Severe, on CPAP therapy    Past Surgical History:  Procedure Laterality Date  . CARDIAC CATHETERIZATION  05/01/2007   Recommend rotational atherectomy followed by PTCA and probable stenting of LAD.  Marland Kitchen CARDIAC CATHETERIZATION  05/28/2007   Mid LAD hihgh grade 80-90% stenosis, stented with a 3x2m Cypher stent implanted at 16atm, postdilated with a 3.25x251mQuantum at 16atm, which revealed excellent wall appoistion - 5.7915m. CARDIOVASCULAR STRESS TEST  02/05/2011   Perfusion defect seen in inferior myocardial region consistent with diagphragmatic attenuation. Remaining myocardium demonstrates normal myocardial perfusion with no evidence of ischemia or infarct. ECG is positive for ischemia.  . CPAP/BIPAP SLEEP STUDY  07/01/2007   AHI-0.5/hr at 11cm water pressure, RDI-12.6/hr  . LOWER ARTERIAL DOPPLER   06/03/2007   No evidence of dissection, AV fistula, or active pseudoaneurysm.  . SMarland KitchenEEP STUDY  05/13/2007   AHI-11.52/hr, AHI REM-56.0/hr, RDI-11.7/hr, RDI REM-56.0/hr, avg oxygen sat-94%  . TRANSTHORACIC ECHOCARDIOGRAM  03/13/2010   EF >55>25%ormal diastolic function, mild MR, mild TR, and normal RVSP    There were no vitals filed for this visit.      Subjective Assessment - 11/29/15 1440    Subjective Pt says hes doing ok today, a bit sore from all the walking and swimming he has been doing, espeically walking in the grass. He says HEP is going well, but has not been working on the balGoogle much as he would have liked.    Pertinent History History of vertigo, remote history of Left knee instability/dislocation (1960's)    Currently in Pain? Yes   Pain Score 4    Pain Location Knee   Pain Orientation Right   Pain Descriptors / Indicators Aching   Pain Type Chronic pain                         [facility] Adult PT Treatment/Exercise - 11/29/15 0001      Knee/Hip Exercises: Stretches   Quad Stretch 10 seconds  10x10 alternating HS stretch, knee drive, 14" riser.   Gastroc Stretch 30 seconds;2 reps   Gastroc Stretch Limitations slant board   Other Knee/Hip Stretches Supine knee hang c 10lbs bil, and moist heat on quads  57mn     Knee/Hip Exercises: Standing   Heel Raises Both;2 sets;15 reps  ROM very limited   Forward Step Up Both;2 sets;Step Height: 6";Hand Hold: 2;Other (comment)  2x8, 6" step bilat   Step Down Both;Step Height: 4";2 sets  2x8 bilat     Knee/Hip Exercises: Seated   Long Arc Quad Both  c 10#; 2x8 bilat      Knee/Hip Exercises: Supine   Bridges Limitations 2x10  unable for time.                   PT Short Term Goals - 11/20/15 1509      PT SHORT TERM GOAL #1   Title After 2 weeks pt will demonstrate independence and compliance in HEP.   Baseline Intermittent compliance, but is independent.    Status Partially Met      PT SHORT TERM GOAL #2   Title After 4 weeks patient will demonstrate improved knee ROM > 110 degree bilat to improve comfort during funcitonal activities.    Baseline 9/20: R: 104 degrees; L: 111 degrees   Status Partially Met     PT SHORT TERM GOAL #3   Title After 4 weeks patient will demonstrate improved funcitonal mobility with 5xSTS <13s and TUG test< 13sec.    Baseline 5xSTS: 10s on 9/20; TUG: 11.51s on 9/20   Status Achieved           PT Long Term Goals - 11/20/15 1510      PT LONG TERM GOAL #1   Title After 7 weeks pt will demonstrate improve bilat knee flexion > 115 degrees flexion bilat.    Status Revised     PT LONG TERM GOAL #2   Title After 7 weeks pt will demonstrate improved funcitonal strength with 5xSTS<9.25s and hip abduction > 4/5 bilat.    Status On-going     PT LONG TERM GOAL #3   Title After 7 weeks pt will demonstrate improved balance AEB SLS> 15 seconds bilat to improve safety at homeand confidence with mobility in the community and traveling.    Baseline Remains less than 6s bilat at reassessment   Status On-going     PT LONG TERM GOAL #4   Title After 7 weeks pt will demonstrate improved AMB speed > 1.380m to improve ability to travel in the airport as needed during travel without needing a WC.                Plan - 11/29/15 1456    Clinical Impression Statement Pt continues to tolerate well increases in therex reps/resists/sets. ROM in bilat knee flexion continues to improve gradually with less discomfort. Walking program at home is progressing. Progress toward goals is steadily being made.    Rehab Potential Good   PT Frequency 2x / week   PT Duration 8 weeks   PT Treatment/Interventions Moist Heat;Therapeutic activities;Gait training;Stair training;Functional mobility training;Therapeutic exercise;Balance training;Patient/family education;Passive range of motion;Manual techniques;Dry needling   PT Next Visit Plan Continue current program,  progress reps/weight as appropriate.    PT Home Exercise Plan 10/23/15 (knee flexion stretch on steps, seated heel raises);  Asked to begin walking 5 minutes, 2x daily, 7 days/week. (increase by 1 minute each week); 9/27: tandem stance 3x30s   Consulted and Agree with Plan of Care Patient      Patient will benefit from skilled therapeutic intervention in order to improve the following deficits and impairments:  Abnormal gait, Decreased activity tolerance, Decreased balance,  Decreased mobility, Decreased strength, Hypomobility, Increased edema, Difficulty walking, Decreased range of motion, Obesity, Pain, Impaired flexibility, Postural dysfunction  Visit Diagnosis: Stiffness of right knee, not elsewhere classified  Stiffness of left knee, not elsewhere classified  Unsteadiness on feet     Problem List Patient Active Problem List   Diagnosis Date Noted  . Type 2 diabetes mellitus with other circulatory complications 56/71/6408  . History of gastric ulcer 05/23/2014  . OSA on CPAP 03/04/2013  . Morbid obesity ([place]) 03/04/2013  . CAD (coronary artery disease) 03/04/2013  . Hyperlipidemia with target LDL less than 70 03/04/2013  . Type 2 diabetes mellitus ([place]) 03/04/2013  . GERD (gastroesophageal reflux disease) 03/04/2013  . Vertigo 03/04/2013  . GASTRIC ULCER, ACUTE 03/28/2009    3:22 PM, 11/29/15 Etta Grandchild, PT, DPT Physical Therapist at [place] [phone] (office)     [place] [address] [place], [place], [address] Phone: [phone]   Fax:  [phone]  Name: WILDER KUROWSKI MRN: [id number] Date of Birth: 1945-04-12

## 2015-12-04 ENCOUNTER — Encounter (HOSPITAL_COMMUNITY): Payer: Medicare Other

## 2015-12-06 ENCOUNTER — Encounter (HOSPITAL_COMMUNITY): Payer: Medicare Other

## 2015-12-11 ENCOUNTER — Ambulatory Visit (HOSPITAL_COMMUNITY): Payer: Medicare Other | Admitting: Physical Therapy

## 2015-12-11 ENCOUNTER — Telehealth (HOSPITAL_COMMUNITY): Payer: Self-pay | Admitting: Physical Therapy

## 2015-12-11 NOTE — Telephone Encounter (Signed)
He just got back from [place] and his back is hurting unable to come today

## 2015-12-13 ENCOUNTER — Telehealth (HOSPITAL_COMMUNITY): Payer: Self-pay

## 2015-12-13 ENCOUNTER — Ambulatory Visit (HOSPITAL_COMMUNITY): Payer: Medicare Other

## 2015-12-13 NOTE — Telephone Encounter (Signed)
Pt is still hurting in his back and he will come next week

## 2015-12-20 ENCOUNTER — Ambulatory Visit (HOSPITAL_COMMUNITY): Payer: Medicare Other | Admitting: Physical Therapy

## 2015-12-20 DIAGNOSIS — M25662 Stiffness of left knee, not elsewhere classified: Secondary | ICD-10-CM

## 2015-12-20 DIAGNOSIS — R2681 Unsteadiness on feet: Secondary | ICD-10-CM

## 2015-12-20 DIAGNOSIS — M25661 Stiffness of right knee, not elsewhere classified: Secondary | ICD-10-CM | POA: Diagnosis not present

## 2015-12-20 NOTE — Therapy (Signed)
[facility], [place], [address] Phone: [phone]   Fax:  [phone]  Physical Therapy Treatment (Re-Assessment)  Patient Details  Name: ADOM SCHOENECK MRN: [id number] Date of Birth: 08-19-1945 Referring Provider: Stephenie Acres   Encounter Date: 01/15/2016      PT End of Session - January 15, 2016 1606    Visit Number 13   Number of Visits 14   Date for PT Re-Evaluation 01/24/16   Authorization Type [employer] Medicare   Authorization Time Period 18-Nov-2015-January 18, 2016 (G-codes done on 01-15-23)   Authorization - Visit Number 13   Authorization - Number of Visits 23   PT Start Time 8527  patient arrived late    PT Stop Time 1427   PT Time Calculation (min) 30 min   Activity Tolerance Patient tolerated treatment well   Behavior During Therapy [facility] for tasks assessed/performed      Past Medical History:  Diagnosis Date  . Arthritis   . CAD (coronary artery disease)   . Colon polyps 01/23/2010   Descending  . Diabetes mellitus   . GI bleed   . Hypertension   . Hypotension    transient  . OSA (obstructive sleep apnea)    Severe, on CPAP therapy    Past Surgical History:  Procedure Laterality Date  . CARDIAC CATHETERIZATION  05/01/2007   Recommend rotational atherectomy followed by PTCA and probable stenting of LAD.  Marland Kitchen CARDIAC CATHETERIZATION  05/28/2007   Mid LAD hihgh grade 80-90% stenosis, stented with a 3x54m Cypher stent implanted at 16atm, postdilated with a 3.25x255mQuantum at 16atm, which revealed excellent wall appoistion - 5.7961m. CARDIOVASCULAR STRESS TEST  02/05/2011   Perfusion defect seen in inferior myocardial region consistent with diagphragmatic attenuation. Remaining myocardium demonstrates normal myocardial perfusion with no evidence of ischemia or infarct. ECG is positive for ischemia.  . CPAP/BIPAP SLEEP STUDY  07/01/2007   AHI-0.5/hr at 11cm water pressure, RDI-12.6/hr  . LOWER ARTERIAL DOPPLER  06/03/2007    No evidence of dissection, AV fistula, or active pseudoaneurysm.  . SMarland KitchenEEP STUDY  05/13/2007   AHI-11.52/hr, AHI REM-56.0/hr, RDI-11.7/hr, RDI REM-56.0/hr, avg oxygen sat-94%  . TRANSTHORACIC ECHOCARDIOGRAM  03/13/2010   EF >55>78%ormal diastolic function, mild MR, mild TR, and normal RVSP    There were no vitals filed for this visit.      Subjective Assessment - 12/06/18/1759    Subjective Patient arrives stating that he hurt [place] to [place] is not sure how, but it is feeling better now; his knees have been doing fairly. He states in general he is doing fairly well, steps are still hard because he cannot go up them as he wants to, he has to take his time. He rates himself as being 70-75% with the remaining limitations being his knees and difficulty with stairs. He went back to swimming, he is doing [employer] but he doesn't feel like he's gotten much out of it.    Pertinent History History of vertigo, remote history of Left knee instability/dislocation (1960's)    How long can you sit comfortably? 10/Nov 20, 2024nlimited   How long can you stand comfortably? 10/20-Nov-20245 minutes    How long can you walk comfortably? 11/2022-11-200-12 minutes (he did not walk much at all on vacation)   Diagnostic tests Xrays   Patient Stated Goals Walking beter and greater ease with going up/down steps   Currently in Pain? Yes   Pain Score  2    Pain Location Knee   Pain Orientation Left;Right   Pain Descriptors / Indicators Nagging;Discomfort   Pain Type Chronic pain   Pain Radiating Towards none    Pain Onset More than a month ago   Pain Frequency Intermittent   Aggravating Factors  stairs and weight bearing    Pain Relieving Factors resting    Effect of Pain on Daily Activities none             [facility] PT Assessment - 12/20/15 0001      Assessment   Medical Diagnosis Bilat Knee OA    Referring Provider Stephenie Acres    Onset Date/Surgical Date --  chronic    Next MD Visit patient states  MD is going to call him in December to schedule another appointment      Balance Screen   Has the patient fallen in the past 6 months No   Has the patient had a decrease in activity level because of a fear of falling?  No   Is the patient reluctant to leave their home because of a fear of falling?  No     Prior Function   Level of Independence Independent   Vocation Retired     Strength   Right Hip Flexion 4-/5   Right Hip Extension 4-/5   Right Hip ABduction 4+/5   Left Hip Flexion 4+/5   Left Hip Extension 4/5   Left Hip ABduction 4+/5   Right Knee Flexion 5/5   Right Knee Extension 5/5   Left Knee Flexion 4+/5   Left Knee Extension 4+/5   Right Ankle Dorsiflexion 5/5   Left Ankle Dorsiflexion 5/5     Transfers   Five time sit to stand comments  11.11 hands free      6 minute walk test results    Aerobic Endurance Distance Walked 653   Endurance additional comments 3MWT, 1.17ms      High Level Balance   High Level Balance Comments SLS 2-3 seconds best R, 5 seconds best L                              PT Education - 12/20/15 1606    Education provided Yes   Education Details POC moving forward    Person(s) Educated Patient   Methods Explanation   Comprehension Verbalized understanding          PT Short Term Goals - 12/20/15 1420      PT SHORT TERM GOAL #1   Title After 2 weeks pt will demonstrate independence and compliance in HEP.   Baseline 10/25- poor compliance on vacation, had good compliance before vacation    Status Partially Met     PT SHORT TERM GOAL #2   Title After 4 weeks patient will demonstrate improved knee ROM > 110 degree bilat to improve comfort during funcitonal activities.    Period Weeks   Status Partially Met     PT SHORT TERM GOAL #3   Title After 4 weeks patient will demonstrate improved funcitonal mobility with 5xSTS <13s and TUG test< 13sec.    Status Achieved           PT Long Term Goals - 12/20/15  1423      PT LONG TERM GOAL #1   Title After 7 weeks pt will demonstrate improve bilat knee flexion > 115 degrees flexion bilat.    Status Not  Met     PT LONG TERM GOAL #2   Title After 7 weeks pt will demonstrate improved funcitonal strength with 5xSTS<9.25s and hip abduction > 4/5 bilat.    Period Weeks   Status Partially Met     PT LONG TERM GOAL #3   Title After 7 weeks pt will demonstrate improved balance AEB SLS> 15 seconds bilat to improve safety at homeand confidence with mobility in the community and traveling.    Status Not Met     PT LONG TERM GOAL #4   Title After 7 weeks pt will demonstrate improved AMB speed > 1.44ms to improve ability to travel in the airport as needed during travel without needing a WC.    Baseline 12024/11/03 1.270m    Status Not Met               Plan - 1003-Nov-2017607    Clinical Impression Statement Re-assessment performed today. Patient has been on vacation and admits that he had a setback when he hurt his back/did not do his HEP over his vacation- however he does feel that he would be successfully able to manage his condition at this point with HEP. Examination does not reveal significant change since last assessment; at this point, recommend one more additional session to be performed with evaluating therapist for fine-tuning of advanced HEP and possible DC (to be further discussed). If patient requests to continue, do not recommend extension of skilled PT services past 3-4 sessions before DC to advanced program.    Rehab Potential Good   PT Frequency Other (comment)  3-4 more visits max, possible DC next session    PT Duration Other (comment)  3-4 more visits max, possible DC next session    PT Treatment/Interventions Moist Heat;Therapeutic activities;Gait training;Stair training;Functional mobility training;Therapeutic exercise;Balance training;Patient/family education;Passive range of motion;Manual techniques;Dry needling   PT Next Visit Plan  one more session to finetune HEP, possible extension 3-4 sessions before DC    Consulted and Agree with Plan of Care Patient      Patient will benefit from skilled therapeutic intervention in order to improve the following deficits and impairments:  Abnormal gait, Decreased activity tolerance, Decreased balance, Decreased mobility, Decreased strength, Hypomobility, Increased edema, Difficulty walking, Decreased range of motion, Obesity, Pain, Impaired flexibility, Postural dysfunction  Visit Diagnosis: Stiffness of right knee, not elsewhere classified - Plan: PT plan of care cert/re-cert  Stiffness of left knee, not elsewhere classified - Plan: PT plan of care cert/re-cert  Unsteadiness on feet - Plan: PT plan of care cert/re-cert       G-Codes - 10Nov 03, 2017609    Functional Assessment Tool Used Clinical Judgment   Functional Limitation Mobility: Walking and moving around   Mobility: Walking and Moving Around Current Status ([phone] least 20 percent but less than 40 percent impaired, limited or restricted   Mobility: Walking and Moving Around Goal Status ([phone] least 20 percent but less than 40 percent impaired, limited or restricted      Problem List Patient Active Problem List   Diagnosis Date Noted  . Type 2 diabetes mellitus with other circulatory complications 0242/35/3614. History of gastric ulcer 05/23/2014  . OSA on CPAP 03/04/2013  . Morbid obesity ([facility]01/09/2013  . CAD (coronary artery disease) 03/04/2013  . Hyperlipidemia with target LDL less than 70 03/04/2013  . Type 2 diabetes mellitus ([facility]/09/2013  . GERD (gastroesophageal reflux disease) 03/04/2013  . Vertigo 03/04/2013  . GASTRIC ULCER, ACUTE  03/28/2009    Deniece Ree PT, DPT [phone]  [facility] [address] [place], [place], [address] Phone: [phone]   Fax:  [phone]  Name: CRESENCIO REESOR MRN: [id number] Date of Birth:  September 10, 1945

## 2015-12-22 ENCOUNTER — Ambulatory Visit (HOSPITAL_COMMUNITY): Payer: Medicare Other

## 2015-12-22 DIAGNOSIS — M25661 Stiffness of right knee, not elsewhere classified: Secondary | ICD-10-CM | POA: Diagnosis not present

## 2015-12-22 DIAGNOSIS — M25662 Stiffness of left knee, not elsewhere classified: Secondary | ICD-10-CM

## 2015-12-22 DIAGNOSIS — R2681 Unsteadiness on feet: Secondary | ICD-10-CM

## 2015-12-22 NOTE — Therapy (Signed)
[place], [place], [address] Phone: [phone]   Fax:  [phone]  Physical Therapy Treatment  Patient Details  Name: Jordan [name] MRN: [id number] Date of Birth: [birth year] Referring Provider: Stephenie Acres   Encounter Date: 12/22/2015      PT End of Session - 12/22/15 1906    Visit Number 14   Number of Visits 14   Date for PT Re-Evaluation 01/24/16   Authorization Type [employer] Medicare   Authorization Time Period 2015/11/03-11/09/2015 (G-codes done on 12/31/22)   Authorization - Visit Number 14   Authorization - Number of Visits 23   PT Start Time 1400  Pt arrived late   PT Stop Time 1431   PT Time Calculation (min) 31 min   Activity Tolerance Patient tolerated treatment well   Behavior During Therapy [facility] for tasks assessed/performed      Past Medical History:  Diagnosis Date  . Arthritis   . CAD (coronary artery disease)   . Colon polyps 01/23/2010   Descending  . Diabetes mellitus   . GI bleed   . Hypertension   . Hypotension    transient  . OSA (obstructive sleep apnea)    Severe, on CPAP therapy    Past Surgical History:  Procedure Laterality Date  . CARDIAC CATHETERIZATION  05/01/2007   Recommend rotational atherectomy followed by PTCA and probable stenting of LAD.  Marland Kitchen CARDIAC CATHETERIZATION  05/28/2007   Mid LAD hihgh grade 80-90% stenosis, stented with a 3x43m Cypher stent implanted at 16atm, postdilated with a 3.25x24mQuantum at 16atm, which revealed excellent wall appoistion - 5.7961m. CARDIOVASCULAR STRESS TEST  02/05/2011   Perfusion defect seen in inferior myocardial region consistent with diagphragmatic attenuation. Remaining myocardium demonstrates normal myocardial perfusion with no evidence of ischemia or infarct. ECG is positive for ischemia.  . CPAP/BIPAP SLEEP STUDY  07/01/2007   AHI-0.5/hr at 11cm water pressure, RDI-12.6/hr  . LOWER ARTERIAL DOPPLER  06/03/2007   No evidence of  dissection, AV fistula, or active pseudoaneurysm.  . SMarland KitchenEEP STUDY  05/13/2007   AHI-11.52/hr, AHI REM-56.0/hr, RDI-11.7/hr, RDI REM-56.0/hr, avg oxygen sat-94%  . TRANSTHORACIC ECHOCARDIOGRAM  03/13/2010   EF >55>93%ormal diastolic function, mild MR, mild TR, and normal RVSP    There were no vitals filed for this visit.      Subjective Assessment - 12/22/15 1400    Subjective Pt says his back is getting better since the trip. He says his knees are feeling pretty good today. He has been getting back into his walking routine, now walking about 10' per session. HEP is also getting back into regular practice.    Pertinent History History of vertigo, remote history of Left knee instability/dislocation (1960's)    Currently in Pain? No/denies            [facility] - 12/22/15 0001      Strength   Right Hip Flexion 5/5   Left Hip Flexion 5/5   Left Knee Flexion 5/5   Left Knee Extension 5/5                     [facility] Adult PT Treatment/Exercise - 12/22/15 0001      Knee/Hip Exercises: Stretches   Other Knee/Hip Stretches quadruped knee flexion stretch: 15x10sec     Knee/Hip Exercises: Machines for Strengthening   Cybex Knee Extension 1x8 bilat, 3 plates   Cybex Knee Flexion 1x8 bilat, 2 plates  Cybex Leg Press Reviewed verbally for DC HEP at [facility] given)                PT Education - 12/22/15 1906    Education provided Yes   Education Details explained importance of continuing with HEP at DC, transitioning to working with a trainer at [employer], and progressing his daily walking program.    Person(s) Educated Patient   Methods Explanation   Comprehension Verbalized understanding          PT Short Term Goals - 12/22/15 1913      PT SHORT TERM GOAL #1   Title After 2 weeks pt will demonstrate independence and compliance in HEP.   Baseline 10/25- poor compliance on vacation, had good compliance before vacation    Status Partially Met      PT SHORT TERM GOAL #2   Title After 4 weeks patient will demonstrate improved knee ROM > 110 degree bilat to improve comfort during funcitonal activities.    Baseline 9/20: R: 104 degrees; L: 111 degrees   Period Weeks   Status Partially Met     PT SHORT TERM GOAL #3   Title After 4 weeks patient will demonstrate improved funcitonal mobility with 5xSTS <13s and TUG test< 13sec.    Baseline 5xSTS: 10s on 9/20; TUG: 11.51s on 9/20   Status Achieved           PT Long Term Goals - 12/22/15 1914      PT LONG TERM GOAL #1   Title After 7 weeks pt will demonstrate improve bilat knee flexion > 115 degrees flexion bilat.    Status Not Met     PT LONG TERM GOAL #2   Title After 7 weeks pt will demonstrate improved funcitonal strength with 5xSTS<9.25s and hip abduction > 4/5 bilat.    Status Partially Met     PT LONG TERM GOAL #3   Title After 7 weeks pt will demonstrate improved balance AEB SLS> 15 seconds bilat to improve safety at homeand confidence with mobility in the community and traveling.    Baseline Remains less than 6s bilat at reassessment   Status Not Met     PT LONG TERM GOAL #4   Title After 7 weeks pt will demonstrate improved AMB speed > 1.41ms to improve ability to travel in the airport as needed during travel without needing a WC.    Baseline 10/25- 1.266m    Status Partially Met               Plan - 12/22/15 1909    Clinical Impression Statement Pt prepared for DC today, discussing findings of reassessment, goals, and progress overall. Pt is agreeable and ready to transition to advacned HEP, walking program, and work at [employer]. Pt has shown only modest improvements since reassessment, however as expected, as he is currently performing 5xSTS and gait speed equal to or better than health age matched norm data. MMT reveals 5/5 strength throughout. Pt continues to be most limited by ROM restrictions in bilat knees, and intermittent shapr pain with  performance of stairs. No additional PT services are needed at this time. Pt is given an opportunity to have his questions addressed.    Rehab Potential Good   PT Treatment/Interventions Moist Heat;Therapeutic activities;Gait training;Stair training;Functional mobility training;Therapeutic exercise;Balance training;Patient/family education;Passive range of motion;Manual techniques;Dry needling   PT Home Exercise Plan 10/23/15 (knee flexion stretch on steps, seated heel raises);  Asked to  begin walking 5 minutes, 2x daily, 7 days/week. (increase by 1 minute each week); 9/27: tandem stance 3x30s   Consulted and Agree with Plan of Care Patient      Patient will benefit from skilled therapeutic intervention in order to improve the following deficits and impairments:  Abnormal gait, Decreased activity tolerance, Decreased balance, Decreased mobility, Decreased strength, Hypomobility, Increased edema, Difficulty walking, Decreased range of motion, Obesity, Pain, Impaired flexibility, Postural dysfunction  Visit Diagnosis: Stiffness of right knee, not elsewhere classified  Stiffness of left knee, not elsewhere classified  Unsteadiness on feet       G-Codes - 12/23/2015 1916    Functional Assessment Tool Used Clinical Judgment   Functional Limitation Mobility: Walking and moving around   Mobility: Walking and Moving Around Goal Status [phone]) At least 20 percent but less than 40 percent impaired, limited or restricted   Mobility: Walking and Moving Around Discharge Status [phone]) At least 1 percent but less than 20 percent impaired, limited or restricted      Problem List Patient Active Problem List   Diagnosis Date Noted  . Type 2 diabetes mellitus with other circulatory complications 10/40/4591  . History of gastric ulcer 05/23/2014  . OSA on CPAP 03/04/2013  . Morbid obesity ([place]) 03/04/2013  . CAD (coronary artery disease) 03/04/2013  . Hyperlipidemia with target LDL less than 70  03/04/2013  . Type 2 diabetes mellitus ([place]) 03/04/2013  . GERD (gastroesophageal reflux disease) 03/04/2013  . Vertigo 03/04/2013  . GASTRIC ULCER, ACUTE 03/28/2009    7:18 PM, 12/23/15 Jordan [name], PT, DPT Physical Therapist at [facility] [phone] (office)     [facility] [address] [place], [place], [address] Phone: [phone]   Fax:  [phone]  Name: Jordan [name] MRN: [id number] Date of Birth: June 11, 1945

## 2016-01-22 ENCOUNTER — Other Ambulatory Visit: Payer: Self-pay | Admitting: Cardiovascular Disease

## 2016-01-22 NOTE — Telephone Encounter (Signed)
REFILL 

## 2016-04-25 ENCOUNTER — Other Ambulatory Visit: Payer: Self-pay | Admitting: *Deleted

## 2016-04-26 MED ORDER — LISINOPRIL 5 MG PO TABS: 2.5000 mg | ORAL_TABLET | Freq: Every day | ORAL | 1 refills | Status: DC

## 2016-04-26 NOTE — Telephone Encounter (Signed)
Rx(s) sent to pharmacy electronically.  

## 2016-05-02 ENCOUNTER — Other Ambulatory Visit: Payer: Self-pay | Admitting: Cardiovascular Disease

## 2016-05-17 ENCOUNTER — Other Ambulatory Visit: Payer: Self-pay | Admitting: Cardiovascular Disease

## 2016-05-17 NOTE — Telephone Encounter (Signed)
REFILL 

## 2016-05-20 ENCOUNTER — Ambulatory Visit (HOSPITAL_COMMUNITY)
Admission: RE | Admit: 2016-05-20 | Discharge: 2016-05-20 | Disposition: A | Discharge status: Home / Self Care | Payer: Medicare Other | Source: Ambulatory Visit | Attending: Family Medicine | Admitting: Family Medicine

## 2016-05-20 ENCOUNTER — Other Ambulatory Visit (HOSPITAL_COMMUNITY): Payer: Self-pay | Admitting: Family Medicine

## 2016-05-20 DIAGNOSIS — R0789 Other chest pain: Secondary | ICD-10-CM | POA: Insufficient documentation

## 2016-05-20 DIAGNOSIS — R079 Chest pain, unspecified: Secondary | ICD-10-CM

## 2016-06-07 ENCOUNTER — Other Ambulatory Visit: Payer: Self-pay | Admitting: Gastroenterology

## 2016-06-17 DIAGNOSIS — M79641 Pain in right hand: Secondary | ICD-10-CM | POA: Insufficient documentation

## 2016-06-17 DIAGNOSIS — M542 Cervicalgia: Secondary | ICD-10-CM | POA: Insufficient documentation

## 2016-06-27 ENCOUNTER — Encounter: Payer: Self-pay | Admitting: Neurology

## 2016-06-27 ENCOUNTER — Ambulatory Visit (INDEPENDENT_AMBULATORY_CARE_PROVIDER_SITE_OTHER): Payer: Medicare Other | Admitting: Neurology

## 2016-06-27 VITALS — BP 149/80 | HR 58 | Ht 66.0 in | Wt 285.0 lb

## 2016-06-27 DIAGNOSIS — Z9989 Dependence on other enabling machines and devices: Secondary | ICD-10-CM

## 2016-06-27 DIAGNOSIS — E669 Obesity, unspecified: Secondary | ICD-10-CM

## 2016-06-27 DIAGNOSIS — G4733 Obstructive sleep apnea (adult) (pediatric): Secondary | ICD-10-CM | POA: Diagnosis not present

## 2016-06-27 DIAGNOSIS — I2510 Atherosclerotic heart disease of native coronary artery without angina pectoris: Secondary | ICD-10-CM | POA: Diagnosis not present

## 2016-06-27 NOTE — Patient Instructions (Signed)
Obesity, Adult Obesity is having too much body fat. If you have a BMI of 30 or more, you are obese. BMI is a number that explains how much body fat you have. Obesity is often caused by taking in (consuming) more calories than your body uses. Obesity can cause serious health problems. Changing your lifestyle can help to treat obesity. Follow these instructions at home: Eating and drinking    Follow advice from your doctor about what to eat and drink. Your doctor may tell you to:  Cut down on (limit) fast foods, sweets, and processed snack foods.  Choose low-fat options. For example, choose low-fat milk instead of whole milk.  Eat 5 or more servings of fruits or vegetables every day.  Eat at home more often. This gives you more control over what you eat.  Choose healthy foods when you eat out.  Learn what a healthy portion size is. A portion size is the amount of a certain food that is healthy for you to eat at one time. This is different for each person.  Keep low-fat snacks available.  Avoid sugary drinks. These include soda, fruit juice, iced tea that is sweetened with sugar, and flavored milk.  Eat a healthy breakfast.  Drink enough water to keep your pee (urine) clear or pale yellow.  Do not go without eating for long periods of time (do not fast).  Do not go on popular or trendy diets (fad diets). Physical Activity   Exercise often, as told by your doctor. Ask your doctor:  What types of exercise are safe for you.  How often you should exercise.  Warm up and stretch before being active.  Do slow stretching after being active (cool down).  Rest between times of being active. Lifestyle   Limit how much time you spend in front of your TV, computer, or video game system (be less sedentary).  Find ways to reward yourself that do not involve food.  Limit alcohol intake to no more than 1 drink a day for nonpregnant women and 2 drinks a day for men. One drink equals 12  oz of beer, 5 oz of wine, or 1 oz of hard liquor. General instructions   Keep a weight loss journal. This can help you keep track of:  The food that you eat.  The exercise that you do.  Take over-the-counter and prescription medicines only as told by your doctor.  Take vitamins and supplements only as told by your doctor.  Think about joining a support group. Your doctor may be able to help with this.  Keep all follow-up visits as told by your doctor. This is important. Contact a doctor if:  You cannot meet your weight loss goal after you have changed your diet and lifestyle for 6 weeks. This information is not intended to replace advice given to you by your health care provider. Make sure you discuss any questions you have with your health care provider. Document Released: 05/06/2011 Document Revised: 07/20/2015 Document Reviewed: 11/30/2014 Elsevier Interactive Patient Education  2017 [employer].
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
## 2016-07-02 ENCOUNTER — Ambulatory Visit (INDEPENDENT_AMBULATORY_CARE_PROVIDER_SITE_OTHER): Payer: Medicare Other | Admitting: Cardiovascular Disease

## 2016-07-02 ENCOUNTER — Encounter: Payer: Self-pay | Admitting: Cardiovascular Disease

## 2016-07-02 VITALS — BP 126/72 | HR 47 | Ht 66.0 in | Wt 284.0 lb

## 2016-07-02 DIAGNOSIS — G4733 Obstructive sleep apnea (adult) (pediatric): Secondary | ICD-10-CM | POA: Diagnosis not present

## 2016-07-02 DIAGNOSIS — I2583 Coronary atherosclerosis due to lipid rich plaque: Secondary | ICD-10-CM | POA: Diagnosis not present

## 2016-07-02 DIAGNOSIS — Z79899 Other long term (current) drug therapy: Secondary | ICD-10-CM

## 2016-07-02 DIAGNOSIS — I2510 Atherosclerotic heart disease of native coronary artery without angina pectoris: Secondary | ICD-10-CM

## 2016-07-02 DIAGNOSIS — E1159 Type 2 diabetes mellitus with other circulatory complications: Secondary | ICD-10-CM

## 2016-07-02 DIAGNOSIS — Z9989 Dependence on other enabling machines and devices: Secondary | ICD-10-CM

## 2016-07-02 DIAGNOSIS — E785 Hyperlipidemia, unspecified: Secondary | ICD-10-CM

## 2016-07-02 MED ORDER — ROSUVASTATIN CALCIUM 5 MG PO TABS: 5.0000 mg | ORAL_TABLET | Freq: Every day | ORAL | 3 refills | Status: DC

## 2016-07-02 NOTE — Progress Notes (Addendum)
Patient ID: Jordan [name], male   DOB: Feb 01, 1946, 71 y.o.   MRN: [id number]   Primary MD: [name]  HPI: Jordan [name] is a 71 y.o. male who presents to the office today for a 6 month cardiology evaluation.  Jordan [name] is a 79 -year-old African-American American gentleman who has a history of CAD , severe obstructive sleep apnea, morbid obesity, type 2 diabetes mellitus, mild essential hypertension, GERD, as well as erectile dysfunction. In April 2009 he was found to have high-grade focally calcified LAD stenosis and underwent successful high-speed rotational atherectomy and DES stenting with insertion of a 3.033 mm Cypher stent.  He has continued to be on long-term antiplatelet therapy with aspirin and Plavix.  He continues to be active.  He denies any recurrent anginal type symptomatology. His nuclear stress test in 2012 continue to suggest patency of this vessel with normal perfusion.  A two-year followup nuclear perfusion study  on 01/28/2013 demonstrated  normal perfusion without scar or ischemia;  EF was 56%.  He has a history of severe  obstructive sleep apnea originally diagnosed in 2009 and he has been on CPAP therapy since that time with 100% compliance.  He recently received a new CPAP machine after his old machine had begun to malfunction.  He was recently placed on a nasal mask and is followed by Dr.Domeiher.  He has a history of hyperlipidemia and when last seen had begun to have issues with simvastatin causing some myalgias.  I suggest that he change this to Crestor and he is now been on a very low-dose at 5 mg and has been taking this 2 times per week.  He has tolerated this.  Repeat blood work in May 2017 showed a total cholesterol of 153, triglycerides 80, HDL 45, and LDL 92.  An echo Doppler study on 05/11/2015 showed an ejection fraction at 55%; there were no regional wall motion abnormalities but there was  grade 2 diastolic dysfunction.  There was mitral annular calcification without  regurgitation.  He normal pulmonic pressures.  His aortic valve was normal.  When I last saw him, he denied any episodes, recurrent of recurrent chest pain.  Hewas bothered by bilateral knee discomfort and  underwent several injections as well as physical therapy for improvement.  Since I last saw him in September 2017, he continues to be followed Dr. Iona Beard.  He admits to 100% compliance with CPAP therapy.  He has not had any episodes of chest pain.  In November 2017 lab work done at the [facility] showed a hemoglobin A1c of 6.3.  He stopped his Crestor since December and has been off this.  He continues to swim.  He is diabetic on metformin.  He continues to be on dual antiplatelet therapy with aspirin and Plavix and denies bleeding.  He has maintained on carvedilol 3.125 twice a day and lisinopril 2.5 mg daily for hypertension. He has  a history of morbid obesity, but  not been very successful with weight loss.  He has a history of erectile dysfunction and has taken Viagra as needed and typically takes this once every 3-4 weeks. He presents for reevaluation.   Past Medical History:  Diagnosis Date  . Arthritis   . CAD (coronary artery disease)   . Colon polyps 01/23/2010   Descending  . Diabetes mellitus   . GI bleed   . Hypertension   . Hypotension    transient  . OSA (obstructive sleep apnea)  Severe, on CPAP therapy    Past Surgical History:  Procedure Laterality Date  . CARDIAC CATHETERIZATION  05/01/2007   Recommend rotational atherectomy followed by PTCA and probable stenting of LAD.  Marland Kitchen CARDIAC CATHETERIZATION  05/28/2007   Mid LAD hihgh grade 80-90% stenosis, stented with a 3x76m Cypher stent implanted at 16atm, postdilated with a 3.25x262mQuantum at 16atm, which revealed excellent wall appoistion - 5.7943m. CARDIOVASCULAR STRESS TEST  02/05/2011   Perfusion defect seen in inferior myocardial region consistent with diagphragmatic attenuation. Remaining myocardium  demonstrates normal myocardial perfusion with no evidence of ischemia or infarct. ECG is positive for ischemia.  . CPAP/BIPAP SLEEP STUDY  07/01/2007   AHI-0.5/hr at 11cm water pressure, RDI-12.6/hr  . LOWER ARTERIAL DOPPLER  06/03/2007   No evidence of dissection, AV fistula, or active pseudoaneurysm.  . SMarland KitchenEEP STUDY  05/13/2007   AHI-11.52/hr, AHI REM-56.0/hr, RDI-11.7/hr, RDI REM-56.0/hr, avg oxygen sat-94%  . TRANSTHORACIC ECHOCARDIOGRAM  03/13/2010   EF >55>45%ormal diastolic function, mild MR, mild TR, and normal RVSP    Allergies  Allergen Reactions  . Codeine   . Statins Other (See Comments)    Muscle aches    Current Outpatient Prescriptions  Medication Sig Dispense Refill  . acetaminophen (TYLENOL 8 HOUR) 650 MG CR tablet Take 650 mg by mouth every 8 (eight) hours as needed.      . AMarland Kitchenoaequorin (PREVAGEN PO) Take 20 mg by mouth daily.    . aMarland Kitchenpirin 81 MG tablet Take 81 mg by mouth daily.      . carvedilol (COREG) 3.125 MG tablet TAKE (1) TABLET BY MOUTH TWICE DAILY WITH MEALS. 180 tablet 1  . Cholecalciferol (VITAMIN D3 PO) Take 2,000 Units by mouth daily.    . CMarland KitchenNNAMON PO Take 1 tablet by mouth 2 (two) times daily. 1000 mg tablets    . clopidogrel (PLAVIX) 75 MG tablet TAKE ONE TABLET BY MOUTH ONCE DAILY. 90 tablet 0  . fluticasone (CUTIVATE) 0.05 % cream Apply 1 application topically daily.     . fluticasone (FLONASE) 50 MCG/ACT nasal spray Place 1 spray into both nostrils 2 (two) times daily.    . Garlic Oil 1004098 CAPS Take by mouth. 1 time daily      . ketoconazole (NIZORAL) 2 % cream Apply 1 application topically daily.    . lMarland Kitchensinopril (PRINIVIL,ZESTRIL) 5 MG tablet Take 0.5 tablets (2.5 mg total) by mouth daily. 45 tablet 1  . metFORMIN (GLUCOPHAGE-XR) 500 MG 24 hr tablet Take 500 mg by mouth daily with breakfast.      . Misc Natural Products (TART CHERRY ADVANCED PO) Take 433 mg by mouth daily.    . NON FORMULARY Beta Prostate. Take 1 capsule by mouth twice daily.    .  NON FORMULARY CPAP    . Nutritional Supplements (NUTRITIONAL SUPPLEMENT PO) Take 3 tablets by mouth daily. Pt takes Enhanced Oral Chelation II    . Omega-3 Fatty Acids (FISH OIL) 1000 MG CAPS Take by mouth. 1 by mouth 2 times daily      . orlistat (ALLI) 60 MG capsule Take 60 mg by mouth 2 (two) times daily.    . pantoprazole (PROTONIX) 40 MG tablet Take 1 tablet (40 mg total) by mouth daily. 90 tablet 3  . sildenafil (VIAGRA) 50 MG tablet Take 100 mg by mouth daily as needed.     . traMADol (ULTRAM) 50 MG tablet Take 50 mg by mouth every 6 (six) hours as needed.    .Marland Kitchen  UNABLE TO FIND Take 2 tablets by mouth daily. Pt takes [employer]    . vitamin B-12 (CYANOCOBALAMIN) 500 MCG tablet Take 1,000 mcg by mouth daily.    . vitamin C (ASCORBIC ACID) 500 MG tablet Take 1,000 mg by mouth 2 (two) times daily.     . vitamin E 400 UNIT capsule Take 400 Units by mouth daily.    . rosuvastatin (CRESTOR) 5 MG tablet Take 1 tablet (5 mg total) by mouth daily. 90 tablet 3   No current facility-administered medications for this visit.     Social History   Social History  . Marital status: Married    Spouse name: N/A  . Number of children: 2  . Years of education: N/A   Occupational History  . retired    Social History Main Topics  . Smoking status: Former Smoker    Quit date: 01/28/1978  . Smokeless tobacco: Never Used  . Alcohol use Yes     Comment: rarely  . Drug use: No  . Sexual activity: Not on file   Other Topics Concern  . Not on file   Social History Narrative  . No narrative on file   Socially he is married and has 2 children. No tobacco or alcohol use.  Family History  Problem Relation Age of Onset  . Stroke Brother   . Hypertension Brother   . Alzheimer's disease Maternal Grandmother   . Stroke Maternal Grandfather   . Heart disease Maternal Grandfather   . Stroke Paternal Grandmother   . Heart attack Paternal Grandfather   . Hypertension Father   . Alzheimer's  disease Father   . Parkinson's disease Father   . Alzheimer's disease Mother     ROS General: Negative; No fevers, chills, or night sweats; positive for minimal weight loss HEENT: Negative; No changes in vision or hearing, sinus congestion, difficulty swallowing Pulmonary: Negative; No cough, wheezing, shortness of breath, hemoptysis Cardiovascular: Negative; No chest pain, presyncope, syncope, palpitations GI: Negative; No nausea, vomiting, diarrhea, or abdominal pain GU: Positive for erectile dysfunction; No dysuria, hematuria, or difficulty voiding Musculoskeletal: Negative; no myalgias, joint pain, or weakness Hematologic/Oncology: Negative; no easy bruising, bleeding Endocrine: Negative; no heat/cold intolerance; no diabetes Neuro: Negative; no changes in balance, headaches Skin: Negative; No rashes or skin lesions Psychiatric: Negative; No behavioral problems, deprression. Sleep: Positive for obstructive sleep apnea which is severe, currently on CPAP with 100% compliance.  He states oftentimes he takes his mask off when he goes to the bathroom but does not put it back on after going back to see bed Other comprehensive 14 point system review is negative.  PE BP 126/72   Pulse (!) 47   Ht 5' 6" (1.676 m)   Wt 284 lb (128.8 kg)   BMI 45.84 kg/m    Repeat blood pressure by me was 122/70  Wt Readings from Last 3 Encounters:  07/02/16 284 lb (128.8 kg)  06/27/16 285 lb (129.3 kg)  11/23/15 297 lb 8 oz (134.9 kg)   General: Alert, oriented, no distress; morbidly obese.  Skin: normal turgor, no rashes HEENT: Normocephalic, atraumatic. Pupils round and reactive; sclera anicteric;no lid lag. Extraocular muscles intact. Nose without nasal septal hypertrophy Mouth/Parynx benign; Mallinpatti scale 3 Neck: No JVD, no carotid bruits; normal carotid upstroke Lungs: clear to ausculatation and percussion; no wheezing or rales Chest wall: no tenderness to palpitation Heart: RRR, s1 s2  normal 1/6 sem; no S3 gallop.  No diastolic murmur, thrills or heaves;  no rub Abdomen: Significant central adiposity; soft, nontender; no hepatosplenomehaly, BS+; abdominal aorta nontender and not dilated by palpation. Back: no CVA tenderness Pulses 2+ Extremities: no clubbing cyanosis or edema, Homan's sign negative  Neurologic: grossly nonfocal; cranial nerves grossly normal. Psychologic: normal affect and mood.  ECG (independently read by me): Sinus bradycardia 47 bpm.  No ST segment changes.  Normal intervals.  ECG (independently read by me): Sinus bradycardia 52 bpm.  No significant ST changes.  Normal intervals.  June 2017 ECG (independently read by me): Sinus bradycardia 50 bpm.  No ectopy.  February 2017 ECG (independently read by me):  Sinus bradycardia with occasional PVC.  Heart rate 55 bpm.  QTc interval 369 ms.  February 2016ECG (independently read by me): Sinus bradycardia 50 bpm.  No ectopy.  Normal intervals.   LABS:  BMP Latest Ref Rng & Units 11/17/2015 01/25/2009 01/24/2009  Glucose 65 - 99 mg/dL 127(H) 95 109(H)  BUN 7 - 25 mg/dL 9 16 39(H)  Creatinine 0.70 - 1.18 mg/dL 0.87 0.73 0.75  Sodium 135 - 146 mmol/L 139 135 138  Potassium 3.5 - 5.3 mmol/L 4.2 3.5 DELTA CHECK NOTED REPEATED TO VERIFY 4.3  Chloride 98 - 110 mmol/L 103 108 110  CO2 20 - 31 mmol/L _0 Calcium 8.6 - 10.3 mg/dL 9.0 7.8(L) 8.3(L)   Hepatic Function Latest Ref Rng & Units 11/17/2015 06/30/2015 01/23/2009  Total Protein 6.1 - 8.1 g/dL 7.0 6.9 5.9(L)  Albumin 3.6 - 5.1 g/dL 3.8 3.9 2.9(L)  AST 10 - 35 U/L _1 ALT 9 - 46 U/L _2 Alk Phosphatase 40 - 115 U/L 65 69 48  Total Bilirubin 0.2 - 1.2 mg/dL 0.6 0.5 0.6  Bilirubin, Direct <=0.2 mg/dL - 0.1 0.1   CBC Latest Ref Rng & Units 03/28/2009 01/26/2009 01/25/2009  WBC 4.5 - 10.5 10*3/microliter 10.3 10.8(H) 11.8(H)  Hemoglobin 13.0 - 17.0 g/dL 13.3 8.4(L) 8.6(L)  Hematocrit 39.0 - 52.0 % 40.9 24.8(L) 25.3(L)  Platelets 150.0 -  400.0 K/uL 234.0 190 184   Lab Results  Component Value Date   MCV 90.6 03/28/2009   MCV 91.6 01/26/2009   MCV 90.8 01/25/2009   No results found for: TSH   No results found for: HGBA1C  Lipid Panel     Component Value Date/Time   CHOL 114 (L) 11/17/2015 0749   TRIG 81 11/17/2015 0749   HDL 42 11/17/2015 0749   CHOLHDL 2.7 11/17/2015 0749   VLDL 16 11/17/2015 0749   LDLCALC 56 11/17/2015 0749     RADIOLOGY: No results found.  IMPRESSION:  1. Coronary artery disease due to lipid rich plaque   2. Type 2 diabetes mellitus with other circulatory complications (HCC)   3. Hyperlipidemia with target LDL less than 70   4. Medication management   5. OSA on CPAP   6. Morbid obesity due to excess calories ([place])   7. Atherosclerosis of native coronary artery of native heart without angina pectoris     ASSESSMENT AND PLAN: Jordan [name] is a 71 year-old African-American male with a history of morbid obesity, hypertension, obstructive sleep apnea, and CAD.  He underwent high-speed rotational artherectomy of the severely calcified focal LAD stenosis with insertion of a 3.0x30 mm DES Cypher stent. Clinically, he continues to do  well with reference to his coronary obstructive disease without recurrent anginal symptoms. His last nuclear perfusion study  suggests patency of this vessel with normal perfusion.  He  continues to swim and does the doggie panel and often may swim for half-mile, which takes approximately one hour.  His blood pressure today is controlled on lisinopril 2.50 mg and carvedilol 3.125 mg twice a day.  He continues to have morbid obesity with a BMI of 45.84.  I discussed the importance of weight loss and also some effect on sleep apnea.  He is bradycardic, but remains completely asymptomatic and I have recommended he continue his low-dose carvedilol.  He has GERD but this is controlled with Protonix.  I discussed with him that the preponderance of REM sleep occurs in the  second phase of night and if he does not have his mask on when he comes back from the bathroom he is most susceptible to adverse effects of sleep apnea during this REM period of sleep.  He occasionally on dual antiplatelet therapy.  I reviewed recent laboratory.  Apparently he stopped taking Crestor and has been off this since December.  He has not had a recent lipid panel checked being off therapy.  I recommended resumption of Crestor initially once a week for 2 weeks and then he will increase this to 2 times per week.  Laboratory will be repeated in 3 months and I will see him in 6 months for reevaluation.   Time spent: 25 minutes  Troy Sine, MD, [facility]  07/03/2016 6:02 PM

## 2016-07-02 NOTE — Patient Instructions (Signed)
Your physician recommends that you return for lab work in: 3 months fasting.  Your physician has recommended you make the following change in your medication: restart the rosuvastatin as directed by Dr Claiborne Billings.  Your physician wants you to follow-up in: 6 months or sooner if needed. You will receive a reminder letter in the mail two months in advance. If you don't receive a letter, please call our office to schedule the follow-up appointment.   If you need a refill on your cardiac medications before your next appointment, please call your pharmacy.

## 2016-08-01 ENCOUNTER — Other Ambulatory Visit: Payer: Self-pay | Admitting: Cardiovascular Disease

## 2016-08-12 ENCOUNTER — Telehealth: Payer: Self-pay | Admitting: Gastroenterology

## 2016-08-12 MED ORDER — PANTOPRAZOLE SODIUM 40 MG PO TBEC: 40.0000 mg | DELAYED_RELEASE_TABLET | Freq: Every day | ORAL | 0 refills | Status: DC

## 2016-08-12 NOTE — Telephone Encounter (Signed)
Medication for pantoprazole 40 mg # 60 refilled until appt in August.

## 2016-09-25 ENCOUNTER — Encounter: Payer: Self-pay | Admitting: Gastroenterology

## 2016-09-25 ENCOUNTER — Ambulatory Visit (INDEPENDENT_AMBULATORY_CARE_PROVIDER_SITE_OTHER): Payer: Medicare Other | Admitting: Gastroenterology

## 2016-09-25 VITALS — BP 122/64 | HR 60 | Ht 64.3 in | Wt 280.4 lb

## 2016-09-25 DIAGNOSIS — K259 Gastric ulcer, unspecified as acute or chronic, without hemorrhage or perforation: Secondary | ICD-10-CM | POA: Diagnosis not present

## 2016-09-25 MED ORDER — PANTOPRAZOLE SODIUM 40 MG PO TBEC: 40.0000 mg | DELAYED_RELEASE_TABLET | Freq: Every day | ORAL | 11 refills | Status: DC

## 2016-09-25 NOTE — Patient Instructions (Addendum)
Refills of protonix, 40mg  pill, one pill once daily disp 30 with 11 refills.  Will refill again in 1 year without need for office visit but if you still need prescription med in 2 years then Dr. Ardis Hughs would like to see you in the office.  Normal BMI (Body Mass Index- based on height and weight) is between 23 and 30. Your BMI today is Body mass index is 47.68 kg/m. Marland Kitchen Please consider follow up  regarding your BMI with your Primary Care Provider.

## 2016-09-25 NOTE — Progress Notes (Signed)
Review of gastrointestinal problems:  1. Gastric ulcer, November 2010. EGD. Presented with an overt melenic GI bleed. Likely NSAID related. H. pylori biopsies negative. Repeat EGD 3 months later was normal.  I recommended he stay on proton pump inhibitor once daily given its history gastric ulcer, permanent need for aspirin and Plavix. 2. routine risk for colon cancer: Colonoscopy by Dr. Ardis Hughs November 2011 small hyper plastic polyp. Internal hemorrhoids, minor diverticulosis. He was recommended to have repeat examination at 10 year interval for screening.   HPI: This is a very pleasant 71 year old man whom I last saw about 3 years ago.    Chief complaint is history of gastric ulcer.  A [employer] RN noticed that his last colonoscopy was in 2011 and he may need repeat colonoscopy now.  She recommended he sit down and talk with me.  No overt bleeding. No   No abd pains.  Only takes tylenol two ES pills twice daily.  Takes it for OA shoulder pains, knee pains.  Does not take NSAIDs anymore.  HAs bilaterl carpal tunnel syndrome we spoke about today.  He's considering release surgery.  He swims 5 days per week.  ROS: complete GI ROS as described in HPI, all other review negative.  Constitutional:  No unintentional weight loss   Past Medical History:  Diagnosis Date  . Arthritis   . CAD (coronary artery disease)   . Colon polyps 01/23/2010   Descending  . Diabetes mellitus   . Diverticulosis   . Gastric ulcer   . GI bleed   . Hypertension   . Hypotension    transient  . OSA (obstructive sleep apnea)    Severe, on CPAP therapy    Past Surgical History:  Procedure Laterality Date  . CARDIAC CATHETERIZATION  05/01/2007   Recommend rotational atherectomy followed by PTCA and probable stenting of LAD.  Marland Kitchen CARDIAC CATHETERIZATION  05/28/2007   Mid LAD hihgh grade 80-90% stenosis, stented with a 3x67mm Cypher stent implanted at 16atm, postdilated with a 3.25x22mm Quantum at  16atm, which revealed excellent wall appoistion - 5.31mm  . CARDIOVASCULAR STRESS TEST  02/05/2011   Perfusion defect seen in inferior myocardial region consistent with diagphragmatic attenuation. Remaining myocardium demonstrates normal myocardial perfusion with no evidence of ischemia or infarct. ECG is positive for ischemia.  . CPAP/BIPAP SLEEP STUDY  07/01/2007   AHI-0.5/hr at 11cm water pressure, RDI-12.6/hr  . LOWER ARTERIAL DOPPLER  06/03/2007   No evidence of dissection, AV fistula, or active pseudoaneurysm.  Marland Kitchen SLEEP STUDY  05/13/2007   AHI-11.52/hr, AHI REM-56.0/hr, RDI-11.7/hr, RDI REM-56.0/hr, avg oxygen sat-94%  . TRANSTHORACIC ECHOCARDIOGRAM  03/13/2010   EF >35%, normal diastolic function, mild MR, mild TR, and normal RVSP    Current Outpatient Prescriptions  Medication Sig Dispense Refill  . acetaminophen (TYLENOL 8 HOUR) 650 MG CR tablet Take 650 mg by mouth every 8 (eight) hours as needed.      Marland Kitchen Apoaequorin (PREVAGEN PO) Take 20 mg by mouth daily.    Marland Kitchen aspirin 81 MG tablet Take 81 mg by mouth daily.      . carvedilol (COREG) 3.125 MG tablet TAKE (1) TABLET BY MOUTH TWICE DAILY WITH MEALS. 180 tablet 1  . Cholecalciferol (VITAMIN D3 PO) Take 2,000 Units by mouth daily.    Marland Kitchen CINNAMON PO Take 1 tablet by mouth 2 (two) times daily. 1000 mg tablets    . clopidogrel (PLAVIX) 75 MG tablet TAKE ONE TABLET BY MOUTH ONCE DAILY. 90 tablet 1  .  FIBER ADULT GUMMIES PO Take 2-3 Doses by mouth daily.    . fluticasone (CUTIVATE) 0.05 % cream Apply 1 application topically daily.     . fluticasone (FLONASE) 50 MCG/ACT nasal spray Place 1 spray into both nostrils 2 (two) times daily.    . Garlic Oil 6761 MG CAPS Take by mouth. 1 time daily      . ketoconazole (NIZORAL) 2 % cream Apply 1 application topically daily.    Marland Kitchen lisinopril (PRINIVIL,ZESTRIL) 5 MG tablet Take 0.5 tablets (2.5 mg total) by mouth daily. 45 tablet 1  . metFORMIN (GLUCOPHAGE-XR) 500 MG 24 hr tablet Take 500 mg by mouth daily  with breakfast.      . Misc Natural Products (TART CHERRY ADVANCED PO) Take 433 mg by mouth daily.    . NON FORMULARY Beta Prostate. Take 1 capsule by mouth twice daily.    . NON FORMULARY CPAP    . Nutritional Supplements (NUTRITIONAL SUPPLEMENT PO) Take 3 tablets by mouth daily. Pt takes Enhanced Oral Chelation II    . Omega-3 Fatty Acids (FISH OIL) 1000 MG CAPS Take by mouth. 1 by mouth 2 times daily      . orlistat (ALLI) 60 MG capsule Take 60 mg by mouth 2 (two) times daily.    . pantoprazole (PROTONIX) 40 MG tablet Take 1 tablet (40 mg total) by mouth daily. 60 tablet 0  . Psyllium (METAMUCIL PO) Take 1 Dose by mouth daily.    . rosuvastatin (CRESTOR) 5 MG tablet Take 1 tablet (5 mg total) by mouth daily. (Patient taking differently: Take 5 mg by mouth 3 (three) times a week. ) 90 tablet 3  . sildenafil (VIAGRA) 50 MG tablet Take 100 mg by mouth daily as needed.     . traMADol (ULTRAM) 50 MG tablet Take 50 mg by mouth every 6 (six) hours as needed.    Marland Kitchen UNABLE TO FIND Take 2 tablets by mouth daily. Pt takes [employer]    . vitamin B-12 (CYANOCOBALAMIN) 500 MCG tablet Take 1,000 mcg by mouth daily.    . vitamin C (ASCORBIC ACID) 500 MG tablet Take 1,000 mg by mouth 2 (two) times daily.     . vitamin E 400 UNIT capsule Take 400 Units by mouth daily.     No current facility-administered medications for this visit.     Allergies as of 09/25/2016 - Review Complete 09/25/2016  Allergen Reaction Noted  . Codeine    . Statins Other (See Comments) 07/02/2016    Family History  Problem Relation Age of Onset  . Stroke Brother   . Hypertension Brother   . Alzheimer's disease Maternal Grandmother   . Stroke Maternal Grandfather   . Heart disease Maternal Grandfather   . Stroke Paternal Grandmother   . Heart attack Paternal Grandfather   . Hypertension Father   . Alzheimer's disease Father   . Parkinson's disease Father   . Alzheimer's disease Mother     Social History   Social  History  . Marital status: Married    Spouse name: N/A  . Number of children: 2  . Years of education: N/A   Occupational History  . retired    Social History Main Topics  . Smoking status: Former Smoker    Quit date: 01/28/1978  . Smokeless tobacco: Never Used  . Alcohol use Yes     Comment: rarely  . Drug use: No  . Sexual activity: Not on file   Other Topics Concern  .  Not on file   Social History Narrative  . No narrative on file     Physical Exam: BP 122/64 (BP Location: Left Arm, Patient Position: Sitting, Cuff Size: Normal)   Pulse 60   Ht 5' 4.3" (1.633 m) Comment: height measured without shoes  Wt 280 lb 6 oz (127.2 kg)   BMI 47.68 kg/m  Constitutional: generally well-appearing Psychiatric: alert and oriented x3 Abdomen: soft, nontender, nondistended, no obvious ascites, no peritoneal signs, normal bowel sounds No peripheral edema noted in lower extremities  Assessment and plan: 71 y.o. male with Personal history of gastric ulcer, routine risk for colon cancer  I am happy to refill his proton pump inhibitor prescription strength with 11 refills and I'll be happy to refill it again in a year from now if he still requires it. He had a bleeding gastric ulcer several years ago which was likely NSAID related. He has need for continued aspirin and Plavix use and so I think it is safest if we prophylax him with proton pump inhibitor once daily. He has 0 GI symptoms. He asked if he is in need of repeat colonoscopy now but he is not since his last one was 7 years ago and he only had a small hyperplastic polyp.  Please see the "Patient Instructions" section for addition details about the plan.  Owens Loffler, MD [facility] 09/25/2016, 11:20 AM

## 2016-10-04 LAB — COMPREHENSIVE METABOLIC PANEL
ALBUMIN: 3.8 g/dL (ref 3.6–5.1)
ALT: 14 U/L (ref 9–46)
AST: 15 U/L (ref 10–35)
Alkaline Phosphatase: 67 U/L (ref 40–115)
BILIRUBIN TOTAL: 0.4 mg/dL (ref 0.2–1.2)
BUN: 12 mg/dL (ref 7–25)
CALCIUM: 9.1 mg/dL (ref 8.6–10.3)
CHLORIDE: 103 mmol/L (ref 98–110)
CO2: 26 mmol/L (ref 20–32)
Creat: 0.84 mg/dL (ref 0.70–1.18)
Glucose, Bld: 97 mg/dL (ref 65–99)
POTASSIUM: 4.3 mmol/L (ref 3.5–5.3)
Sodium: 139 mmol/L (ref 135–146)
Total Protein: 6.8 g/dL (ref 6.1–8.1)

## 2016-10-04 LAB — LIPID PANEL
Cholesterol: 108 mg/dL (ref <200)
HDL: 49 mg/dL (ref >40)
LDL CALC: 49 mg/dL (ref <100)
TRIGLYCERIDES: 48 mg/dL (ref <150)
Total CHOL/HDL Ratio: 2.2 Ratio (ref <5.0)
VLDL: 10 mg/dL (ref <30)

## 2016-10-15 ENCOUNTER — Encounter: Payer: Self-pay | Admitting: *Deleted

## 2016-10-16 ENCOUNTER — Other Ambulatory Visit: Payer: Self-pay | Admitting: Cardiovascular Disease

## 2016-11-18 ENCOUNTER — Other Ambulatory Visit: Payer: Self-pay | Admitting: Cardiovascular Disease

## 2016-12-27 ENCOUNTER — Other Ambulatory Visit: Payer: Self-pay | Admitting: Cardiovascular Disease

## 2017-01-04 ENCOUNTER — Other Ambulatory Visit: Payer: Self-pay | Admitting: Cardiovascular Disease

## 2017-01-06 NOTE — Telephone Encounter (Signed)
REFILL 

## 2017-01-13 ENCOUNTER — Ambulatory Visit: Payer: Medicare Other | Admitting: Cardiovascular Disease

## 2017-01-13 ENCOUNTER — Encounter: Payer: Self-pay | Admitting: Cardiovascular Disease

## 2017-01-13 VITALS — BP 102/62 | HR 43 | Ht 66.0 in | Wt 282.8 lb

## 2017-01-13 DIAGNOSIS — E1159 Type 2 diabetes mellitus with other circulatory complications: Secondary | ICD-10-CM

## 2017-01-13 DIAGNOSIS — K219 Gastro-esophageal reflux disease without esophagitis: Secondary | ICD-10-CM | POA: Diagnosis not present

## 2017-01-13 DIAGNOSIS — I2583 Coronary atherosclerosis due to lipid rich plaque: Secondary | ICD-10-CM

## 2017-01-13 DIAGNOSIS — E785 Hyperlipidemia, unspecified: Secondary | ICD-10-CM

## 2017-01-13 DIAGNOSIS — I2510 Atherosclerotic heart disease of native coronary artery without angina pectoris: Secondary | ICD-10-CM | POA: Diagnosis not present

## 2017-01-13 DIAGNOSIS — Z9989 Dependence on other enabling machines and devices: Secondary | ICD-10-CM

## 2017-01-13 DIAGNOSIS — G4733 Obstructive sleep apnea (adult) (pediatric): Secondary | ICD-10-CM | POA: Diagnosis not present

## 2017-01-13 MED ORDER — ROSUVASTATIN CALCIUM 5 MG PO TABS: ORAL_TABLET | ORAL | 3 refills | Status: DC

## 2017-01-13 MED ORDER — CARVEDILOL 3.125 MG PO TABS: 3.1250 mg | ORAL_TABLET | Freq: Every day | ORAL | 3 refills | Status: DC

## 2017-01-13 MED ORDER — CLOPIDOGREL BISULFATE 75 MG PO TABS: 75.0000 mg | ORAL_TABLET | Freq: Every day | ORAL | 3 refills | Status: DC

## 2017-01-13 MED ORDER — LISINOPRIL 5 MG PO TABS: 2.5000 mg | ORAL_TABLET | Freq: Every day | ORAL | 3 refills | Status: DC

## 2017-01-13 NOTE — Patient Instructions (Signed)
Medication Instructions:  DECREASE carvedilol (Coreg) to 3.125 mg once daily  Follow-Up: Your physician wants you to follow-up in: 6 months with [name]. You will receive a reminder letter in the mail two months in advance. If you don't receive a letter, please call our office to schedule the follow-up appointment.   Any Other Special Instructions Will Be Listed Below (If Applicable).     If you need a refill on your cardiac medications before your next appointment, please call your pharmacy.

## 2017-01-13 NOTE — Progress Notes (Signed)
Patient ID: Jordan [name], male   DOB: Aug 13, 1945, 71 y.o.   MRN: [id number]   Primary MD: [name]  HPI: Jordan [name] is a 71 y.o. male who presents to the office today for a 6 month cardiology evaluation.  Jordan [name] is a 26 -year-old African-American American gentleman who has a history of CAD , severe obstructive sleep apnea, morbid obesity, type 2 diabetes mellitus, mild essential hypertension, GERD, as well as erectile dysfunction. In April 2009 he was found to have high-grade focally calcified LAD stenosis and underwent successful high-speed rotational atherectomy and DES stenting with insertion of a 3.033 mm Cypher stent.  He has continued to be on long-term antiplatelet therapy with aspirin and Plavix.  He continues to be active.  He denies any recurrent anginal type symptomatology. His nuclear stress test in 2012 continue to suggest patency of this vessel with normal perfusion.  A two-year followup nuclear perfusion study  on 01/28/2013 demonstrated  normal perfusion without scar or ischemia;  EF was 56%.  He has a history of severe  obstructive sleep apnea originally diagnosed in 2009 and he has been on CPAP therapy since that time with 100% compliance.  He recently received a new CPAP machine after his old machine had begun to malfunction.  He was recently placed on a nasal mask and is followed by Dr.Domeiher.  He has a history of hyperlipidemia and when last seen had begun to have issues with simvastatin causing some myalgias.  I suggest that he change this to Crestor and he is now been on a very low-dose at 5 mg and has been taking this 2 times per week.  He has tolerated this.  Repeat blood work in May 2017 showed a total cholesterol of 153, triglycerides 80, HDL 45, and LDL 92.  An echo Doppler study on 05/11/2015 showed an ejection fraction at 55%; there were no regional wall motion abnormalities but there was  grade 2 diastolic dysfunction.  There was mitral annular calcification without  regurgitation.  He normal pulmonic pressures.  His aortic valve was normal.  In the past.  He has had bilateral knee discomfort and  underwent several injections as well as physical therapy for improvement.  He continues to be followed Dr. Iona Beard , for primary care.  Since I last saw him, he has been without chest pain or shortness of breath.  He is still swimming 5 days per week and typically does.  The "doggy paddle."  He admits to 100% compliance with CPAP therapy.  He has been taking carvedilol 3.125 mg twice a day and lisinopril 2.5 mg for blood pressure.  Aspirin and Plavix.  Is unaware of bleeding.  He is diabetic on metformin.  He has been taking his Crestor 5 mg 3 times per week and seems to tolerate this.  He has GERD on pantoprazole.  He presents for evaluation.  Past Medical History:  Diagnosis Date  . Arthritis   . CAD (coronary artery disease)   . Colon polyps 01/23/2010   Descending  . Diabetes mellitus   . Diverticulosis   . Gastric ulcer   . GI bleed   . Hypertension   . Hypotension    transient  . OSA (obstructive sleep apnea)    Severe, on CPAP therapy    Past Surgical History:  Procedure Laterality Date  . CARDIAC CATHETERIZATION  05/01/2007   Recommend rotational atherectomy followed by PTCA and probable stenting of LAD.  Marland Kitchen CARDIAC CATHETERIZATION  05/28/2007   Mid LAD hihgh grade 80-90% stenosis, stented with a 3x57m Cypher stent implanted at 16atm, postdilated with a 3.25x254mQuantum at 16atm, which revealed excellent wall appoistion - 5.7929m. CARDIOVASCULAR STRESS TEST  02/05/2011   Perfusion defect seen in inferior myocardial region consistent with diagphragmatic attenuation. Remaining myocardium demonstrates normal myocardial perfusion with no evidence of ischemia or infarct. ECG is positive for ischemia.  . CPAP/BIPAP SLEEP STUDY  07/01/2007   AHI-0.5/hr at 11cm water pressure, RDI-12.6/hr  . LOWER ARTERIAL DOPPLER  06/03/2007   No evidence of dissection,  AV fistula, or active pseudoaneurysm.  . SMarland KitchenEEP STUDY  05/13/2007   AHI-11.52/hr, AHI REM-56.0/hr, RDI-11.7/hr, RDI REM-56.0/hr, avg oxygen sat-94%  . TRANSTHORACIC ECHOCARDIOGRAM  03/13/2010   EF >55>70%ormal diastolic function, mild MR, mild TR, and normal RVSP    Allergies  Allergen Reactions  . Codeine   . Statins Other (See Comments)    Muscle aches Muscle aches    Current Outpatient Medications  Medication Sig Dispense Refill  . acetaminophen (TYLENOL 8 HOUR) 650 MG CR tablet Take 650 mg by mouth every 8 (eight) hours as needed.      . AMarland Kitchenoaequorin (PREVAGEN PO) Take 20 mg by mouth daily.    . aMarland Kitchenpirin 81 MG tablet Take 81 mg by mouth daily.      . carvedilol (COREG) 3.125 MG tablet Take 1 tablet (3.125 mg total) daily by mouth. 90 tablet 3  . Cholecalciferol (VITAMIN D3 PO) Take 2,000 Units by mouth daily.    . CMarland KitchenNNAMON PO Take 1 tablet by mouth 2 (two) times daily. 1000 mg tablets    . clopidogrel (PLAVIX) 75 MG tablet Take 1 tablet (75 mg total) daily by mouth. 90 tablet 3  . FIBER ADULT GUMMIES PO Take 2-3 Doses by mouth daily.    . fluticasone (CUTIVATE) 0.05 % cream Apply 1 application topically daily.     . fluticasone (FLONASE) 50 MCG/ACT nasal spray Place 1 spray into both nostrils 2 (two) times daily.    . Garlic Oil 1001410 CAPS Take by mouth. 1 time daily      . ketoconazole (NIZORAL) 2 % cream Apply 1 application topically daily.    . lMarland Kitchensinopril (PRINIVIL,ZESTRIL) 5 MG tablet Take 0.5 tablets (2.5 mg total) daily by mouth. 45 tablet 3  . metFORMIN (GLUCOPHAGE-XR) 500 MG 24 hr tablet Take 500 mg by mouth daily with breakfast.      . Misc Natural Products (TART CHERRY ADVANCED PO) Take 433 mg by mouth daily.    . NON FORMULARY Beta Prostate. Take 1 capsule by mouth twice daily.    . NON FORMULARY CPAP    . Nutritional Supplements (NUTRITIONAL SUPPLEMENT PO) Take 3 tablets by mouth daily. Pt takes Enhanced Oral Chelation II    . Omega-3 Fatty Acids (FISH OIL) 1000 MG  CAPS Take by mouth. 1 by mouth 2 times daily      . orlistat (ALLI) 60 MG capsule Take 60 mg by mouth 2 (two) times daily.    . pantoprazole (PROTONIX) 40 MG tablet Take 1 tablet (40 mg total) by mouth daily. 30 tablet 11  . Psyllium (METAMUCIL PO) Take 1 Dose by mouth daily.    . rosuvastatin (CRESTOR) 5 MG tablet TAKE (1) TABLET BY MOUTH 3 TIMES A WEEK. IF TOLERATES THEN INCREASE TO EVERY OTHER DAY. 45 tablet 3  . sildenafil (VIAGRA) 50 MG tablet Take 100 mg by mouth daily as needed.     .Marland Kitchen  traMADol (ULTRAM) 50 MG tablet Take 50 mg by mouth every 6 (six) hours as needed.    Marland Kitchen UNABLE TO FIND Take 2 tablets by mouth daily. Pt takes [employer]    . vitamin B-12 (CYANOCOBALAMIN) 500 MCG tablet Take 1,000 mcg by mouth daily.    . vitamin C (ASCORBIC ACID) 500 MG tablet Take 1,000 mg by mouth 2 (two) times daily.     . vitamin E 400 UNIT capsule Take 400 Units by mouth daily.     No current facility-administered medications for this visit.     Social History   Socioeconomic History  . Marital status: Married    Spouse name: Not on file  . Number of children: 2  . Years of education: Not on file  . Highest education level: Not on file  Social Needs  . Financial resource strain: Not on file  . Food insecurity - worry: Not on file  . Food insecurity - inability: Not on file  . Transportation needs - medical: Not on file  . Transportation needs - non-medical: Not on file  Occupational History  . Occupation: retired  Tobacco Use  . Smoking status: Former Smoker    Last attempt to quit: 01/28/1978    Years since quitting: 39.0  . Smokeless tobacco: Never Used  Substance and Sexual Activity  . Alcohol use: Yes    Comment: rarely  . Drug use: No  . Sexual activity: Not on file  Other Topics Concern  . Not on file  Social History Narrative  . Not on file   Socially he is married and has 2 children. No tobacco or alcohol use.  Family History  Problem Relation Age of Onset  .  Stroke Brother   . Hypertension Brother   . Alzheimer's disease Maternal Grandmother   . Stroke Maternal Grandfather   . Heart disease Maternal Grandfather   . Stroke Paternal Grandmother   . Heart attack Paternal Grandfather   . Hypertension Father   . Alzheimer's disease Father   . Parkinson's disease Father   . Alzheimer's disease Mother     ROS General: Negative; No fevers, chills, or night sweats; positive for minimal weight loss HEENT: Negative; No changes in vision or hearing, sinus congestion, difficulty swallowing Pulmonary: Negative; No cough, wheezing, shortness of breath, hemoptysis Cardiovascular: See history of present illness; No chest pain, presyncope, syncope, palpitations GI: GERD, controlled with pantoprazole GU: Positive for erectile dysfunction; No dysuria, hematuria, or difficulty voiding Musculoskeletal: Occasional knee discomfort Hematologic/Oncology: Negative; no easy bruising, bleeding Endocrine: Negative; no heat/cold intolerance; no diabetes Neuro: Negative; no changes in balance, headaches Skin: Negative; No rashes or skin lesions Psychiatric: Negative; No behavioral problems, deprression. Sleep: Positive for obstructive sleep apnea which is severe, currently on CPAP with 100% compliance.  He states oftentimes he takes his mask off when he goes to the bathroom but does not put it back on after going back to see bed Other comprehensive 14 point system review is negative.  PE BP 102/62   Pulse (!) 43   Ht '5\' 6"'  (1.676 m)   Wt 282 lb 12.8 oz (128.3 kg)   BMI 45.65 kg/m    Repeat blood pressure by me was 110/68  Wt Readings from Last 3 Encounters:  01/13/17 282 lb 12.8 oz (128.3 kg)  09/25/16 280 lb 6 oz (127.2 kg)  07/02/16 284 lb (128.8 kg)   General: Alert, oriented, no distress.  Skin: normal turgor, no rashes, warm and dry  HEENT: Normocephalic, atraumatic. Pupils equal round and reactive to light; sclera anicteric; extraocular muscles  intact;  Nose without nasal septal hypertrophy Mouth/Parynx benign; Mallinpatti scale 3 Neck: No JVD, no carotid bruits; normal carotid upstroke Lungs: clear to ausculatation and percussion; no wheezing or rales Chest wall: without tenderness to palpitation Heart: PMI not displaced, RRR, s1 s2 normal, 1/6 systolic murmur, no diastolic murmur, no rubs, gallops, thrills, or heaves Abdomen: Central adiposity soft, nontender; no hepatosplenomehaly, BS+; abdominal aorta nontender and not dilated by palpation. Back: no CVA tenderness Pulses 2+ Musculoskeletal: full range of motion, normal strength, no joint deformities Extremities: no clubbing cyanosis or edema, Homan's sign negative  Neurologic: grossly nonfocal; Cranial nerves grossly wnl Psychologic: Normal mood and affect   ECG (independently read by me): Sinus bradycardia 43 bpm normal intervals.  No ST segment changes.  May 2018 ECG (independently read by me): Sinus bradycardia 47 bpm.  No ST segment changes.  Normal intervals.  ECG (independently read by me): Sinus bradycardia 52 bpm.  No significant ST changes.  Normal intervals.  June 2017 ECG (independently read by me): Sinus bradycardia 50 bpm.  No ectopy.  February 2017 ECG (independently read by me):  Sinus bradycardia with occasional PVC.  Heart rate 55 bpm.  QTc interval 369 ms.  February 2016 ECG (independently read by me): Sinus bradycardia 50 bpm.  No ectopy.  Normal intervals.   LABS:  BMP Latest Ref Rng & Units 10/03/2016 11/17/2015 01/25/2009  Glucose 65 - 99 mg/dL 97 127(H) 95  BUN 7 - 25 mg/dL '12 9 16  ' Creatinine 0.70 - 1.18 mg/dL 0.84 0.87 0.73  Sodium 135 - 146 mmol/L 139 139 135  Potassium 3.5 - 5.3 mmol/L 4.3 4.2 3.5 DELTA CHECK NOTED REPEATED TO VERIFY  Chloride 98 - 110 mmol/L 103 103 108  CO2 20 - 32 mmol/L '26 29 25  ' Calcium 8.6 - 10.3 mg/dL 9.1 9.0 7.8(L)   Hepatic Function Latest Ref Rng & Units 10/03/2016 11/17/2015 06/30/2015  Total Protein 6.1 - 8.1 g/dL  6.8 7.0 6.9  Albumin 3.6 - 5.1 g/dL 3.8 3.8 3.9  AST 10 - 35 U/L '15 16 13  ' ALT 9 - 46 U/L '14 14 14  ' Alk Phosphatase 40 - 115 U/L 67 65 69  Total Bilirubin 0.2 - 1.2 mg/dL 0.4 0.6 0.5  Bilirubin, Direct <=0.2 mg/dL - - 0.1   CBC Latest Ref Rng & Units 03/28/2009 01/26/2009 01/25/2009  WBC 4.5 - 10.5 10*3/microliter 10.3 10.8(H) 11.8(H)  Hemoglobin 13.0 - 17.0 g/dL 13.3 8.4(L) 8.6(L)  Hematocrit 39.0 - 52.0 % 40.9 24.8(L) 25.3(L)  Platelets 150.0 - 400.0 K/uL 234.0 190 184   Lab Results  Component Value Date   MCV 90.6 03/28/2009   MCV 91.6 01/26/2009   MCV 90.8 01/25/2009   No results found for: TSH   No results found for: HGBA1C  Lipid Panel     Component Value Date/Time   CHOL 108 10/03/2016 0740   TRIG 48 10/03/2016 0740   HDL 49 10/03/2016 0740   CHOLHDL 2.2 10/03/2016 0740   VLDL 10 10/03/2016 0740   LDLCALC 49 10/03/2016 0740     RADIOLOGY: No results found.  IMPRESSION:  1. Coronary artery disease due to lipid rich plaque   2. Hyperlipidemia with target LDL less than 70   3. Type 2 diabetes mellitus with other circulatory complications ([place])   4. OSA on CPAP   5. Morbid obesity due to excess calories ([place])   6. Gastroesophageal  reflux disease without esophagitis     ASSESSMENT AND PLAN: Jordan [name] is a 71 year-old African-American male who has a  history of morbid obesity, hypertension, obstructive sleep apnea, and CAD.  He underwent high-speed rotational artherectomy of the severely calcified focal LAD stenosis with insertion of a 3.0x30 mm DES Cypher stent. Clinically, he continues to do  well with reference to his coronary obstructive disease.  He is not having any recurrent anginal symptomatology.  His last nuclear study revealed normal perfusion, suggesting continued patency of his LAD.  When I last saw him, he was off Crestor, and he is now taking Crestor 5 mg 3 times a week and is tolerating this well.  Subsequent blood work in August 2018 showed  significant improvement in lipids with total cholesterol 108 and LDL cholesterol at 49.  He has stable renal function and normal LFTs.  On last laboratory assessment.  He continues to be morbidly obese.  He is exercising at least 5 days per week and swims regularly.  He is without anginal symptoms or change in exercise tolerance.  He is significantly bradycardic and has been taking carvedilol 3.125 mg twice a day.  I have suggested that he reduce this and just take a morning dose.  If he continues to be bradycardic.  This will be discontinued altogether.  His GERD is controlled with pantoprazole.  He is diabetic on metformin.  He continues to be compliant with CPAP therapy and admits to 100% compliance.  He is unaware of breakthrough snoring.  His sleep is restorative.  I will see him in 6 months for reevaluation or sooner if problems arise.    Time spent: 25 minutes  Troy Sine, MD, [facility]  01/18/2017 11:05 AM

## 2017-01-18 ENCOUNTER — Encounter: Payer: Self-pay | Admitting: Cardiovascular Disease

## 2017-06-29 ENCOUNTER — Encounter: Payer: Self-pay | Admitting: Neurology

## 2017-06-30 ENCOUNTER — Encounter: Payer: Self-pay | Admitting: Adult Health

## 2017-06-30 ENCOUNTER — Ambulatory Visit: Payer: Medicare Other | Admitting: Adult Health

## 2017-06-30 VITALS — BP 131/76 | HR 56 | Ht 66.0 in | Wt 289.0 lb

## 2017-06-30 DIAGNOSIS — Z9989 Dependence on other enabling machines and devices: Secondary | ICD-10-CM | POA: Diagnosis not present

## 2017-06-30 DIAGNOSIS — G4733 Obstructive sleep apnea (adult) (pediatric): Secondary | ICD-10-CM

## 2017-06-30 NOTE — Progress Notes (Signed)
DME CPAP orders for new supplies successfully faxed to [facility]. 

## 2017-06-30 NOTE — Progress Notes (Signed)
PATIENT: Jordan [name] DOB: Dec 15, 1945  REASON FOR VISIT: follow up- osa on cpap HISTORY FROM: patient  HISTORY OF PRESENT ILLNESS: Today 06/30/17 Jordan [name] is a 72 year old male with a history of obstructive sleep apnea on CPAP.  He returns today for follow-up.  His CPAP download indicates that he uses machine nightly for compliance of 100%.  He uses machine greater than 4 hours for an average of 80%.  His residual AHI is 4.4.  He does not have a significant leak.  Overall he feels that the CPAP continues to work well for him.  His Epworth sleepiness score is 7 fatigue severity score is 40.  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  He states that he has seen a hand surgeon but does not want to proceed with surgery at this time.  He returns today for an evaluation.  HISTORY (Copied from Dr. Edwena Felty note): had the pleasure of seeing Jordan [name] today following his sleep study from the 21st Feb. 2017. His sleep study became a split-night protocol after his AHI reached 25.7,  in supine position his AHI was 45. REM sleep was not seen.He was then titrated to CPAP beginning at 5 cm water pressure with stepwise titration to a pressure of 11 cm. He seemed to do the very best at 9 cm water pressure. He was using an Eason nasal mask we also offered him a full face mask [place]. He prefers a nasal mask. Snoring was treated and apnea was much reduced, there were no periodic limb movements of sleep noted, nadir oxygen saturation was 88% prior to CPAP use was on 12.2 minutes of desaturation.  Jordan [name] is now using CPAP is using an ace flex machine dream station average user time is 7 hours and 1 minute 100% compliance over the last 30 days and 100% over 4 hours of daily use. His machine averages and 90 percentile pressure of 8.3 cm. Average AHI is 5.4 which is satisfactory. Auto set 5-15 with 3 cm EPR.  He reports having no trouble using CPAP and the humidifier. He likes  that it is quiet.    REVIEW OF SYSTEMS: Out of a complete 14 system review of symptoms, the patient complains only of the following symptoms, and all other reviewed systems are negative.  See HPI  ALLERGIES: Allergies  Allergen Reactions  . Codeine   . Statins Other (See Comments)    Muscle aches Muscle aches    HOME MEDICATIONS: Outpatient Medications Prior to Visit  Medication Sig Dispense Refill  . acetaminophen (TYLENOL 8 HOUR) 650 MG CR tablet Take 650 mg by mouth every 8 (eight) hours as needed.      Marland Kitchen Apoaequorin (PREVAGEN PO) Take 20 mg by mouth daily.    Marland Kitchen aspirin 81 MG tablet Take 81 mg by mouth daily.      . carvedilol (COREG) 3.125 MG tablet Take 1 tablet (3.125 mg total) daily by mouth. 90 tablet 3  . Cholecalciferol (VITAMIN D3 PO) Take 2,000 Units by mouth daily.    Marland Kitchen CINNAMON PO Take 1 tablet by mouth 2 (two) times daily. 1000 mg tablets    . clopidogrel (PLAVIX) 75 MG tablet Take 1 tablet (75 mg total) daily by mouth. 90 tablet 3  . FIBER ADULT GUMMIES PO Take 2-3 Doses by mouth daily.    . fluticasone (CUTIVATE) 0.05 % cream Apply 1 application topically daily.     . fluticasone (  FLONASE) 50 MCG/ACT nasal spray Place 1 spray into both nostrils 2 (two) times daily.    . Garlic Oil 6712 MG CAPS Take by mouth. 1 time daily      . ketoconazole (NIZORAL) 2 % cream Apply 1 application topically daily.    Marland Kitchen lisinopril (PRINIVIL,ZESTRIL) 5 MG tablet Take 0.5 tablets (2.5 mg total) daily by mouth. 45 tablet 3  . metFORMIN (GLUCOPHAGE-XR) 500 MG 24 hr tablet Take 500 mg by mouth daily with breakfast.      . Misc Natural Products (TART CHERRY ADVANCED PO) Take 433 mg by mouth daily.    . NON FORMULARY Beta Prostate. Take 1 capsule by mouth twice daily.    . NON FORMULARY CPAP    . Nutritional Supplements (NUTRITIONAL SUPPLEMENT PO) Take 3 tablets by mouth daily. Pt takes Enhanced Oral Chelation II    . Omega-3 Fatty Acids (FISH OIL) 1000 MG CAPS Take by mouth. 1 by  mouth 2 times daily      . orlistat (ALLI) 60 MG capsule Take 60 mg by mouth 2 (two) times daily.    . pantoprazole (PROTONIX) 40 MG tablet Take 1 tablet (40 mg total) by mouth daily. 30 tablet 11  . Psyllium (METAMUCIL PO) Take 1 Dose by mouth daily.    . rosuvastatin (CRESTOR) 5 MG tablet TAKE (1) TABLET BY MOUTH 3 TIMES A WEEK. IF TOLERATES THEN INCREASE TO EVERY OTHER DAY. 45 tablet 3  . sildenafil (VIAGRA) 50 MG tablet Take 100 mg by mouth daily as needed.     . traMADol (ULTRAM) 50 MG tablet Take 50 mg by mouth every 6 (six) hours as needed.    Marland Kitchen UNABLE TO FIND Take 2 tablets by mouth daily. Pt takes [employer]    . vitamin B-12 (CYANOCOBALAMIN) 500 MCG tablet Take 1,000 mcg by mouth daily.    . vitamin C (ASCORBIC ACID) 500 MG tablet Take 1,000 mg by mouth 2 (two) times daily.     . vitamin E 400 UNIT capsule Take 400 Units by mouth daily.     No facility-administered medications prior to visit.     PAST MEDICAL HISTORY: Past Medical History:  Diagnosis Date  . Arthritis   . CAD (coronary artery disease)   . Colon polyps 01/23/2010   Descending  . Diabetes mellitus   . Diverticulosis   . Gastric ulcer   . GI bleed   . Hypertension   . Hypotension    transient  . OSA (obstructive sleep apnea)    Severe, on CPAP therapy    PAST SURGICAL HISTORY: Past Surgical History:  Procedure Laterality Date  . CARDIAC CATHETERIZATION  05/01/2007   Recommend rotational atherectomy followed by PTCA and probable stenting of LAD.  Marland Kitchen CARDIAC CATHETERIZATION  05/28/2007   Mid LAD hihgh grade 80-90% stenosis, stented with a 3x79m Cypher stent implanted at 16atm, postdilated with a 3.25x28mQuantum at 16atm, which revealed excellent wall appoistion - 5.7975m. CARDIOVASCULAR STRESS TEST  02/05/2011   Perfusion defect seen in inferior myocardial region consistent with diagphragmatic attenuation. Remaining myocardium demonstrates normal myocardial perfusion with no evidence of ischemia or  infarct. ECG is positive for ischemia.  . CPAP/BIPAP SLEEP STUDY  07/01/2007   AHI-0.5/hr at 11cm water pressure, RDI-12.6/hr  . LOWER ARTERIAL DOPPLER  06/03/2007   No evidence of dissection, AV fistula, or active pseudoaneurysm.  . SMarland KitchenEEP STUDY  05/13/2007   AHI-11.52/hr, AHI REM-56.0/hr, RDI-11.7/hr, RDI REM-56.0/hr, avg oxygen sat-94%  . TRANSTHORACIC  ECHOCARDIOGRAM  03/13/2010   EF >48%, normal diastolic function, mild MR, mild TR, and normal RVSP    FAMILY HISTORY: Family History  Problem Relation Age of Onset  . Stroke Brother   . Hypertension Brother   . Alzheimer's disease Maternal Grandmother   . Stroke Maternal Grandfather   . Heart disease Maternal Grandfather   . Stroke Paternal Grandmother   . Heart attack Paternal Grandfather   . Hypertension Father   . Alzheimer's disease Father   . Parkinson's disease Father   . Alzheimer's disease Mother     SOCIAL HISTORY: Social History   Socioeconomic History  . Marital status: Married    Spouse name: Not on file  . Number of children: 2  . Years of education: Not on file  . Highest education level: Not on file  Occupational History  . Occupation: retired  Scientific laboratory technician  . Financial resource strain: Not on file  . Food insecurity:    Worry: Not on file    Inability: Not on file  . Transportation needs:    Medical: Not on file    Non-medical: Not on file  Tobacco Use  . Smoking status: Former Smoker    Last attempt to quit: 01/28/1978    Years since quitting: 39.4  . Smokeless tobacco: Never Used  Substance and Sexual Activity  . Alcohol use: Yes    Comment: rarely  . Drug use: No  . Sexual activity: Not on file  Lifestyle  . Physical activity:    Days per week: Not on file    Minutes per session: Not on file  . Stress: Not on file  Relationships  . Social connections:    Talks on phone: Not on file    Gets together: Not on file    Attends religious service: Not on file    Active member of club or  organization: Not on file    Attends meetings of clubs or organizations: Not on file    Relationship status: Not on file  . Intimate partner violence:    Fear of current or ex partner: Not on file    Emotionally abused: Not on file    Physically abused: Not on file    Forced sexual activity: Not on file  Other Topics Concern  . Not on file  Social History Narrative  . Not on file      PHYSICAL EXAM  Vitals:   06/30/17 0743  BP: 131/76  Pulse: (!) 56  Weight: 289 lb (131.1 kg)  Height: '5\' 6"'  (1.676 m)   Body mass index is 46.65 kg/m.  Generalized: Well developed, in no acute distress   Neurological examination  Mentation: Alert oriented to time, place, history taking. Follows all commands speech and language fluent Cranial nerve II-XII: Pupils were equal round reactive to light. Extraocular movements were full, visual field were full on confrontational test. Facial sensation and strength were normal. Uvula tongue midline. Head turning and shoulder shrug  were normal and symmetric.  Neck circumference 17.75 inches, Mallampati 3+ Motor: The motor testing reveals 5 over 5 strength of all 4 extremities. Good symmetric motor tone is noted throughout.  Sensory: Sensory testing is intact to soft touch on all 4 extremities. No evidence of extinction is noted.  Coordination: Cerebellar testing reveals good finger-nose-finger and heel-to-shin bilaterally.  Gait and station: Gait is normal. Tandem gait is normal. Romberg is negative. No drift is seen.  Reflexes: Deep tendon reflexes are symmetric and normal bilaterally.  DIAGNOSTIC DATA (LABS, IMAGING, TESTING) - I reviewed patient records, labs, notes, testing and imaging myself where available.      Component Value Date/Time   NA 139 10/03/2016 0740   K 4.3 10/03/2016 0740   CL 103 10/03/2016 0740   CO2 26 10/03/2016 0740   GLUCOSE 97 10/03/2016 0740   BUN 12 10/03/2016 0740   CREATININE 0.84 10/03/2016 0740   CALCIUM 9.1  10/03/2016 0740   PROT 6.8 10/03/2016 0740   ALBUMIN 3.8 10/03/2016 0740   AST 15 10/03/2016 0740   ALT 14 10/03/2016 0740   ALKPHOS 67 10/03/2016 0740   BILITOT 0.4 10/03/2016 0740   GFRNONAA >60 01/25/2009 0220   GFRAA  01/25/2009 0220    >60        The eGFR has been calculated using the MDRD equation. This calculation has not been validated in all clinical situations. eGFR's persistently <60 mL/min signify possible Chronic Kidney Disease.   Lab Results  Component Value Date   CHOL 108 10/03/2016   HDL 49 10/03/2016   LDLCALC 49 10/03/2016   TRIG 48 10/03/2016   CHOLHDL 2.2 10/03/2016     ASSESSMENT AND PLAN 72 y.o. year old male  has a past medical history of Arthritis, CAD (coronary artery disease), Colon polyps (01/23/2010), Diabetes mellitus, Diverticulosis, Gastric ulcer, GI bleed, Hypertension, Hypotension, and OSA (obstructive sleep apnea). here with:  1.  Obstructive sleep apnea on CPAP  Overall the patient is doing well.  His CPAP download shows excellent compliance and good treatment of his apnea.  He is encouraged to continue using the CPAP nightly.  I have sent an order for new supplies.  He will follow-up in 1 year or sooner if needed.   I spent 15 minutes with the patient. 50% of this time was spent reviewing CPAP download   Ward Givens, MSN, NP-C 06/30/2017, 7:48 AM [facility] [address], [place], [place] [address] [phone]

## 2017-06-30 NOTE — Patient Instructions (Addendum)
Your Plan:  Continue using CPap nightly and >4 hours each night If your symptoms worsen or you develop new symptoms please let us know.   Please call [employer] at [phone], and press option 1 when prompted. Their customer service representatives will be glad to assist you. If they are unable to answer, please leave a message and they will call you back. Make sure to leave your name and return phone number.   Thank you for coming to see [place] at [facility]. I hope we have been able to provide you high quality care today.  You may receive a patient satisfaction survey over the next few weeks. We would appreciate your feedback and comments so that we may continue to improve ourselves and the health of our patients.

## 2017-07-18 NOTE — Progress Notes (Signed)
I agree with the assessment and plan as directed by NP .The patient is known to me .   Micael Barb, MD  

## 2017-10-02 ENCOUNTER — Other Ambulatory Visit: Payer: Self-pay | Admitting: Gastroenterology

## 2017-12-15 ENCOUNTER — Encounter: Payer: Self-pay | Admitting: Cardiovascular Disease

## 2017-12-15 ENCOUNTER — Ambulatory Visit: Payer: Medicare Other | Admitting: Cardiovascular Disease

## 2017-12-15 VITALS — BP 124/72 | HR 46 | Ht 66.0 in | Wt 288.4 lb

## 2017-12-15 DIAGNOSIS — I2510 Atherosclerotic heart disease of native coronary artery without angina pectoris: Secondary | ICD-10-CM | POA: Diagnosis not present

## 2017-12-15 DIAGNOSIS — G4733 Obstructive sleep apnea (adult) (pediatric): Secondary | ICD-10-CM | POA: Diagnosis not present

## 2017-12-15 DIAGNOSIS — E1159 Type 2 diabetes mellitus with other circulatory complications: Secondary | ICD-10-CM

## 2017-12-15 DIAGNOSIS — K219 Gastro-esophageal reflux disease without esophagitis: Secondary | ICD-10-CM

## 2017-12-15 DIAGNOSIS — E785 Hyperlipidemia, unspecified: Secondary | ICD-10-CM | POA: Diagnosis not present

## 2017-12-15 DIAGNOSIS — I2583 Coronary atherosclerosis due to lipid rich plaque: Secondary | ICD-10-CM | POA: Diagnosis not present

## 2017-12-15 DIAGNOSIS — Z9989 Dependence on other enabling machines and devices: Secondary | ICD-10-CM

## 2017-12-15 MED ORDER — LISINOPRIL 5 MG PO TABS: 2.5000 mg | ORAL_TABLET | Freq: Every day | ORAL | 3 refills | Status: DC

## 2017-12-15 MED ORDER — ROSUVASTATIN CALCIUM 5 MG PO TABS: ORAL_TABLET | ORAL | 3 refills | Status: DC

## 2017-12-15 MED ORDER — CARVEDILOL 3.125 MG PO TABS: 3.1250 mg | ORAL_TABLET | Freq: Every day | ORAL | 3 refills | Status: DC

## 2017-12-15 MED ORDER — CLOPIDOGREL BISULFATE 75 MG PO TABS: 75.0000 mg | ORAL_TABLET | Freq: Every day | ORAL | 3 refills | Status: DC

## 2017-12-15 NOTE — Patient Instructions (Signed)
Medication Instructions:  Your physician recommends that you continue on your current medications as directed. Please refer to the Current Medication list given to you today.  If you need a refill on your cardiac medications before your next appointment, please call your pharmacy.   Testing/Procedures: Your physician has requested that you have a lexiscan myoview. For further information please visit [email or web address]. Please follow instruction sheet, as given.  Follow-Up: At [facility], you and your health needs are our priority.  As part of our continuing mission to provide you with exceptional heart care, we have created designated Provider Care Teams.  These Care Teams include your primary Cardiologist (physician) and Advanced Practice Providers (APPs -  Physician Assistants and Nurse Practitioners) who all work together to provide you with the care you need, when you need it. You will need a follow up appointment in 12 months.  Please call our office 2 months in advance to schedule this appointment.  You may see [name] or one of the following Advanced Practice Providers on your designated Care Team: [place], [place] . Fabian Sharp, PA-C

## 2017-12-15 NOTE — Progress Notes (Signed)
Patient ID: Jordan [name], male   DOB: April 28, 1945, 72 y.o.   MRN: [id number]   Primary MD: [name]  HPI: Jordan [name] is a 72 y.o. male who presents to the office today for an 4 month cardiology evaluation.  Mr. Felicetti is a 26 -year-old African-American American gentleman who has a history of CAD , severe obstructive sleep apnea, morbid obesity, type 2 diabetes mellitus, mild essential hypertension, GERD, as well as erectile dysfunction. In April 2009 he was found to have high-grade focally calcified LAD stenosis and underwent successful high-speed rotational atherectomy and DES stenting with insertion of a 3.033 mm Cypher stent.  He has continued to be on long-term antiplatelet therapy with aspirin and Plavix.  He continues to be active.  He denies any recurrent anginal type symptomatology. His nuclear stress test in 2012 continue to suggest patency of this vessel with normal perfusion.  A two-year followup nuclear perfusion study  on 01/28/2013 demonstrated  normal perfusion without scar or ischemia;  EF was 56%.  He has a history of severe  obstructive sleep apnea originally diagnosed in 2009 and he has been on CPAP therapy since that time with 100% compliance.  He recently received a new CPAP machine after his old machine had begun to malfunction.  He was recently placed on a nasal mask and is followed by Dr.Domeiher.  He has a history of hyperlipidemia and when last seen had begun to have issues with simvastatin causing some myalgias.  I suggest that he change this to Crestor and he is now been on a very low-dose at 5 mg and has been taking this 2 times per week.  He has tolerated this.  Repeat blood work in May 2017 showed a total cholesterol of 153, triglycerides 80, HDL 45, and LDL 92.  An echo Doppler study on 05/11/2015 showed an ejection fraction at 55%; there were no regional wall motion abnormalities but there was  grade 2 diastolic dysfunction.  There was mitral annular calcification  without regurgitation.  He normal pulmonic pressures.  His aortic valve was normal.  In the past.  He has had bilateral knee discomfort and  underwent several injections as well as physical therapy for improvement.  He continues to be followed Dr. Iona Beard , for primary care.  I last saw him in November 2018 at which time he was continuing to do well from a cardiac standpoint and was without chest pain or shortness of breath.   Over the past year, Mr. Fetters is continued to do well from a cardiac standpoint.  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  He continues to swim at least 5 days/week and 1/2 mile a time doing doggy paddle.  He does admit to some fatigability.  He continues to use CPAP with 100% compliance and uses a nasal mask.  He continues to be on aspirin and Plavix for antiplatelet therapy, lisinopril 2.5 mg for hypertension.  He is diabetic on metformin.  He continues to take rosuvastatin 5 mg for hyperlipidemia and Protonix for GERD.  He presents for evaluation.   Past Medical History:  Diagnosis Date  . Arthritis   . CAD (coronary artery disease)   . Colon polyps 01/23/2010   Descending  . Diabetes mellitus   . Diverticulosis   . Gastric ulcer   . GI bleed   . Hypertension   . Hypotension    transient  . OSA (obstructive sleep apnea)    Severe, on CPAP therapy  Past Surgical History:  Procedure Laterality Date  . CARDIAC CATHETERIZATION  05/01/2007   Recommend rotational atherectomy followed by PTCA and probable stenting of LAD.  Marland Kitchen CARDIAC CATHETERIZATION  05/28/2007   Mid LAD hihgh grade 80-90% stenosis, stented with a 3x54m Cypher stent implanted at 16atm, postdilated with a 3.25x246mQuantum at 16atm, which revealed excellent wall appoistion - 5.7919m. CARDIOVASCULAR STRESS TEST  02/05/2011   Perfusion defect seen in inferior myocardial region consistent with diagphragmatic attenuation. Remaining myocardium demonstrates normal myocardial perfusion with no  evidence of ischemia or infarct. ECG is positive for ischemia.  . CPAP/BIPAP SLEEP STUDY  07/01/2007   AHI-0.5/hr at 11cm water pressure, RDI-12.6/hr  . LOWER ARTERIAL DOPPLER  06/03/2007   No evidence of dissection, AV fistula, or active pseudoaneurysm.  . SMarland KitchenEEP STUDY  05/13/2007   AHI-11.52/hr, AHI REM-56.0/hr, RDI-11.7/hr, RDI REM-56.0/hr, avg oxygen sat-94%  . TRANSTHORACIC ECHOCARDIOGRAM  03/13/2010   EF >55>67%ormal diastolic function, mild MR, mild TR, and normal RVSP    Allergies  Allergen Reactions  . Codeine   . Statins Other (See Comments)    Muscle aches Muscle aches    Current Outpatient Medications  Medication Sig Dispense Refill  . acetaminophen (TYLENOL 8 HOUR) 650 MG CR tablet Take 650 mg by mouth every 8 (eight) hours as needed.      . aMarland Kitchenpirin 81 MG tablet Take 81 mg by mouth daily.      . carvedilol (COREG) 3.125 MG tablet Take 1 tablet (3.125 mg total) by mouth daily. 90 tablet 3  . Cholecalciferol (VITAMIN D3 PO) Take 2,000 Units by mouth daily.    . CMarland KitchenNNAMON PO Take 1 tablet by mouth 2 (two) times daily. 1000 mg tablets    . clopidogrel (PLAVIX) 75 MG tablet Take 1 tablet (75 mg total) by mouth daily. 90 tablet 3  . FIBER ADULT GUMMIES PO Take 2-3 Doses by mouth daily.    . fluticasone (CUTIVATE) 0.05 % cream Apply 1 application topically daily.     . fluticasone (FLONASE) 50 MCG/ACT nasal spray Place 1 spray into both nostrils 2 (two) times daily.    . Garlic Oil 1006720 CAPS Take by mouth. 1 time daily      . ketoconazole (NIZORAL) 2 % cream Apply 1 application topically daily.    . lMarland Kitchensinopril (PRINIVIL,ZESTRIL) 5 MG tablet Take 0.5 tablets (2.5 mg total) by mouth daily. 45 tablet 3  . metFORMIN (GLUCOPHAGE-XR) 500 MG 24 hr tablet Take 500 mg by mouth daily with breakfast.      . NON FORMULARY Beta Prostate. Take 1 capsule by mouth twice daily.    . NON FORMULARY CPAP    . Nutritional Supplements (NUTRITIONAL SUPPLEMENT PO) Take 3 tablets by mouth daily. Pt  takes Enhanced Oral Chelation II    . Omega-3 Fatty Acids (FISH OIL) 1000 MG CAPS Take by mouth. 1 by mouth 2 times daily      . orlistat (ALLI) 60 MG capsule Take 60 mg by mouth 2 (two) times daily.    . pantoprazole (PROTONIX) 40 MG tablet TAKE 1 TABLET BY MOUTH DAILY. 30 tablet 5  . Psyllium (METAMUCIL PO) Take 1 Dose by mouth daily.    . rosuvastatin (CRESTOR) 5 MG tablet TAKE (1) TABLET BY MOUTH 3 TIMES A WEEK. IF TOLERATES THEN INCREASE TO EVERY OTHER DAY. 45 tablet 3  . sildenafil (VIAGRA) 100 MG tablet Take 100 mg by mouth daily as needed.     . UMarland KitchenABLE  TO FIND Take 2 tablets by mouth daily. Pt takes [employer]    . vitamin B-12 (CYANOCOBALAMIN) 500 MCG tablet Take 1,000 mcg by mouth daily.    . vitamin C (ASCORBIC ACID) 500 MG tablet Take 1,000 mg by mouth 2 (two) times daily.     . vitamin E 400 UNIT capsule Take 400 Units by mouth daily.    Marland Kitchen Apoaequorin (PREVAGEN PO) Take 20 mg by mouth daily.    . Misc Natural Products (TART CHERRY ADVANCED PO) Take 433 mg by mouth daily.    . traMADol (ULTRAM) 50 MG tablet Take 50 mg by mouth every 6 (six) hours as needed.     No current facility-administered medications for this visit.     Social History   Socioeconomic History  . Marital status: Married    Spouse name: Not on file  . Number of children: 2  . Years of education: Not on file  . Highest education level: Not on file  Occupational History  . Occupation: retired  Scientific laboratory technician  . Financial resource strain: Not on file  . Food insecurity:    Worry: Not on file    Inability: Not on file  . Transportation needs:    Medical: Not on file    Non-medical: Not on file  Tobacco Use  . Smoking status: Former Smoker    Last attempt to quit: 01/28/1978    Years since quitting: 39.9  . Smokeless tobacco: Never Used  Substance and Sexual Activity  . Alcohol use: Yes    Comment: rarely  . Drug use: No  . Sexual activity: Not on file  Lifestyle  . Physical activity:    Days  per week: Not on file    Minutes per session: Not on file  . Stress: Not on file  Relationships  . Social connections:    Talks on phone: Not on file    Gets together: Not on file    Attends religious service: Not on file    Active member of club or organization: Not on file    Attends meetings of clubs or organizations: Not on file    Relationship status: Not on file  . Intimate partner violence:    Fear of current or ex partner: Not on file    Emotionally abused: Not on file    Physically abused: Not on file    Forced sexual activity: Not on file  Other Topics Concern  . Not on file  Social History Narrative  . Not on file   Socially he is married and has 2 children. No tobacco or alcohol use.  Family History  Problem Relation Age of Onset  . Stroke Brother   . Hypertension Brother   . Alzheimer's disease Maternal Grandmother   . Stroke Maternal Grandfather   . Heart disease Maternal Grandfather   . Stroke Paternal Grandmother   . Heart attack Paternal Grandfather   . Hypertension Father   . Alzheimer's disease Father   . Parkinson's disease Father   . Alzheimer's disease Mother     ROS General: Negative; No fevers, chills, or night sweats; mild fatigue HEENT: Negative; No changes in vision or hearing, sinus congestion, difficulty swallowing Pulmonary: Negative; No cough, wheezing, shortness of breath, hemoptysis Cardiovascular: See history of present illness; No chest pain, presyncope, syncope, palpitations GI: GERD, controlled with pantoprazole GU: Positive for erectile dysfunction; No dysuria, hematuria, or difficulty voiding Musculoskeletal: Occasional knee discomfort Hematologic/Oncology: Negative; no easy bruising, bleeding Endocrine:  Negative; no heat/cold intolerance; no diabetes Neuro: Negative; no changes in balance, headaches Skin: Negative; No rashes or skin lesions Psychiatric: Negative; No behavioral problems, deprression. Sleep: Positive for  obstructive sleep apnea which is severe, currently on CPAP with 100% compliance.  He states oftentimes he takes his mask off when he goes to the bathroom but does not put it back on after going back to see bed Other comprehensive 14 point system review is negative.  PE BP 124/72   Pulse (!) 46   Ht '5\' 6"'  (1.676 m)   Wt 288 lb 6.4 oz (130.8 kg)   BMI 46.55 kg/m    Repeat blood pressure by me 112/70  Wt Readings from Last 3 Encounters:  12/15/17 288 lb 6.4 oz (130.8 kg)  06/30/17 289 lb (131.1 kg)  01/13/17 282 lb 12.8 oz (128.3 kg)   General: Alert, oriented, no distress.  Skin: normal turgor, no rashes, warm and dry HEENT: Normocephalic, atraumatic. Pupils equal round and reactive to light; sclera anicteric; extraocular muscles intact;  Nose without nasal septal hypertrophy Mouth/Parynx benign; Mallinpatti scale 3 Neck: No JVD, no carotid bruits; normal carotid upstroke Lungs: clear to ausculatation and percussion; no wheezing or rales Chest wall: without tenderness to palpitation Heart: PMI not displaced, RRR, s1 s2 normal, 1/6 systolic murmur, no diastolic murmur, no rubs, gallops, thrills, or heaves Abdomen: Moderate central adiposity; soft, nontender; no hepatosplenomehaly, BS+; abdominal aorta nontender and not dilated by palpation. Back: no CVA tenderness Pulses 2+ Musculoskeletal: full range of motion, normal strength, no joint deformities Extremities: no clubbing cyanosis or edema, Homan's sign negative  Neurologic: grossly nonfocal; Cranial nerves grossly wnl Psychologic: Normal mood and affect   ECG (independently read by me): Sinus bradycardia at 46 bpm.  No ectopy.  Normal intervals.  November 2018 ECG (independently read by me): Sinus bradycardia 43 bpm normal intervals.  No ST segment changes.  May 2018 ECG (independently read by me): Sinus bradycardia 47 bpm.  No ST segment changes.  Normal intervals.  ECG (independently read by me): Sinus bradycardia 52 bpm.   No significant ST changes.  Normal intervals.  June 2017 ECG (independently read by me): Sinus bradycardia 50 bpm.  No ectopy.  February 2017 ECG (independently read by me):  Sinus bradycardia with occasional PVC.  Heart rate 55 bpm.  QTc interval 369 ms.  February 2016 ECG (independently read by me): Sinus bradycardia 50 bpm.  No ectopy.  Normal intervals.   LABS:  BMP Latest Ref Rng & Units 10/03/2016 11/17/2015 01/25/2009  Glucose 65 - 99 mg/dL 97 127(H) 95  BUN 7 - 25 mg/dL '12 9 16  ' Creatinine 0.70 - 1.18 mg/dL 0.84 0.87 0.73  Sodium 135 - 146 mmol/L 139 139 135  Potassium 3.5 - 5.3 mmol/L 4.3 4.2 3.5 DELTA CHECK NOTED REPEATED TO VERIFY  Chloride 98 - 110 mmol/L 103 103 108  CO2 20 - 32 mmol/L '26 29 25  ' Calcium 8.6 - 10.3 mg/dL 9.1 9.0 7.8(L)   Hepatic Function Latest Ref Rng & Units 10/03/2016 11/17/2015 06/30/2015  Total Protein 6.1 - 8.1 g/dL 6.8 7.0 6.9  Albumin 3.6 - 5.1 g/dL 3.8 3.8 3.9  AST 10 - 35 U/L '15 16 13  ' ALT 9 - 46 U/L '14 14 14  ' Alk Phosphatase 40 - 115 U/L 67 65 69  Total Bilirubin 0.2 - 1.2 mg/dL 0.4 0.6 0.5  Bilirubin, Direct <=0.2 mg/dL - - 0.1   CBC Latest Ref Rng & Units 03/28/2009 01/26/2009 01/25/2009  WBC 4.5 - 10.5 10*3/microliter 10.3 10.8(H) 11.8(H)  Hemoglobin 13.0 - 17.0 g/dL 13.3 8.4(L) 8.6(L)  Hematocrit 39.0 - 52.0 % 40.9 24.8(L) 25.3(L)  Platelets 150.0 - 400.0 K/uL 234.0 190 184   Lab Results  Component Value Date   MCV 90.6 03/28/2009   MCV 91.6 01/26/2009   MCV 90.8 01/25/2009   No results found for: TSH   No results found for: HGBA1C  Lipid Panel     Component Value Date/Time   CHOL 108 10/03/2016 0740   TRIG 48 10/03/2016 0740   HDL 49 10/03/2016 0740   CHOLHDL 2.2 10/03/2016 0740   VLDL 10 10/03/2016 0740   LDLCALC 49 10/03/2016 0740     RADIOLOGY: No results found.  IMPRESSION:  1. Coronary artery disease due to lipid rich plaque   2. Hyperlipidemia with target LDL less than 70   3. Type 2 diabetes mellitus with other  circulatory complications ([place])   4. OSA on CPAP   5. Gastroesophageal reflux disease without esophagitis     ASSESSMENT AND PLAN: Mr. Traivon Morrical is a 72 year-old African-American male who has a  history of morbid obesity, hypertension, obstructive sleep apnea, and CAD.  He underwent high-speed rotational artherectomy of the severely calcified focal LAD stenosis with insertion of a 3.0x30 mm DES Cypher stent.  He has continued to do exceptionally well and denies any recurrent anginal symptomatology.  His last nuclear perfusion study was in December 2014 which demonstrated normal perfusion without scar or ischemia, ejection fraction was 56%.  He admits to some fatigability which has increased compared to last year.  He continues to swim half a mile at least 5 days/week doing the doggy paddle.  Since 5 years has elapsed since his last nuclear perfusion study, I have suggested he undergo a follow-up [place] study to make certain he is not having any associated ischemia which may be contributing to his fatigability.  Remotely he had not experienced prior classic chest pain.  He continues to use CPAP with 100% compliance and will not sleep without it.  He is sleeping for adequate duration.  He is unaware of breakthrough snoring.  He is now back on Crestor 5 mg 5 days a week.  He is tolerating this well without myalgias.  Most recent lipid studies in June 2019 showed an LDL of 65.  He is diabetic on metformin.  Hemoglobin A1c in June 2019 was 6.2.  He continues to have morbid obesity with a BMI of 46.55.  He is exercising.  We discussed weight loss if possible.  His GERD is controlled with pantoprazole.  I will contact him regarding his Myoview results.  If this is unchanged and normal I will see him in 1 year for follow-up evaluation or if abnormality is demonstrated I will see him in follow-up of his evaluation.  Time spent: 25 minutes Troy Sine, MD, [facility]  12/17/2017 7:44 AM

## 2017-12-17 ENCOUNTER — Encounter: Payer: Self-pay | Admitting: Cardiovascular Disease

## 2018-01-08 ENCOUNTER — Telehealth (HOSPITAL_COMMUNITY): Payer: Self-pay

## 2018-01-08 NOTE — Telephone Encounter (Signed)
Encounter complete. 

## 2018-01-13 ENCOUNTER — Ambulatory Visit (HOSPITAL_COMMUNITY)
Admission: RE | Admit: 2018-01-13 | Discharge: 2018-01-13 | Disposition: A | Discharge status: Home / Self Care | Payer: Medicare Other | Source: Ambulatory Visit | Attending: Cardiovascular Disease | Admitting: Cardiovascular Disease

## 2018-01-13 DIAGNOSIS — I2510 Atherosclerotic heart disease of native coronary artery without angina pectoris: Secondary | ICD-10-CM | POA: Insufficient documentation

## 2018-01-13 DIAGNOSIS — I2583 Coronary atherosclerosis due to lipid rich plaque: Secondary | ICD-10-CM | POA: Diagnosis not present

## 2018-01-13 MED ORDER — REGADENOSON 0.4 MG/5ML IV SOLN
0.4000 mg | Freq: Once | INTRAVENOUS | Status: AC
  Administered 2018-01-13: 0.4 mg via INTRAVENOUS

## 2018-01-13 MED ORDER — TECHNETIUM TC 99M TETROFOSMIN IV KIT
31.6000 | PACK | Freq: Once | INTRAVENOUS | Status: AC | PRN
  Administered 2018-01-13: 31.6 via INTRAVENOUS
  Filled 2018-01-13: qty 32

## 2018-01-14 ENCOUNTER — Ambulatory Visit (HOSPITAL_COMMUNITY)
Admission: RE | Admit: 2018-01-14 | Discharge: 2018-01-14 | Disposition: A | Discharge status: Home / Self Care | Payer: Medicare Other | Source: Ambulatory Visit | Attending: Internal Medicine | Admitting: Internal Medicine

## 2018-01-14 LAB — MYOCARDIAL PERFUSION IMAGING
CHL CUP NUCLEAR SDS: 1
CHL CUP NUCLEAR SRS: 3
CHL CUP RESTING HR STRESS: 45 {beats}/min
CSEPPHR: 75 {beats}/min
LVDIAVOL: 138 mL (ref 62–150)
LVSYSVOL: 71 mL
NUC STRESS TID: 1.04
SSS: 4

## 2018-01-14 MED ORDER — TECHNETIUM TC 99M TETROFOSMIN IV KIT
32.0000 | PACK | Freq: Once | INTRAVENOUS | Status: AC | PRN
  Administered 2018-01-14: 32 via INTRAVENOUS

## 2018-01-30 ENCOUNTER — Telehealth: Payer: Self-pay | Admitting: Cardiovascular Disease

## 2018-01-30 NOTE — Telephone Encounter (Signed)
Follow Up:     Pt would like his Stress Test results from 01-13-18 please.

## 2018-01-30 NOTE — Telephone Encounter (Signed)
Notes recorded by Troy Sine, MD on 01/18/2018 at 7:23 PM EST Low risk nuclear perfusion study with EF at 49%. Small apical inferior defect without ischemia  Results were sent to MyChart by RN on 01/20/18  Patient notified of results

## 2018-04-02 ENCOUNTER — Other Ambulatory Visit: Payer: Self-pay

## 2018-04-02 MED ORDER — CARVEDILOL 3.125 MG PO TABS: 3.1250 mg | ORAL_TABLET | Freq: Every day | ORAL | 3 refills | Status: DC

## 2018-04-06 ENCOUNTER — Other Ambulatory Visit: Payer: Self-pay | Admitting: Gastroenterology

## 2018-05-04 ENCOUNTER — Other Ambulatory Visit: Payer: Self-pay | Admitting: Gastroenterology

## 2018-06-29 ENCOUNTER — Encounter: Payer: Self-pay | Admitting: Adult Health

## 2018-06-29 ENCOUNTER — Telehealth: Payer: Self-pay

## 2018-06-29 NOTE — Telephone Encounter (Signed)
Spoke with the patient and he has given verbal consent to doing a doxy.me visit with Megan on 07/02/2018 at 10 am. Mobile number has been confirmed. Text has been sent.

## 2018-07-02 ENCOUNTER — Ambulatory Visit (INDEPENDENT_AMBULATORY_CARE_PROVIDER_SITE_OTHER): Payer: Medicare Other | Admitting: Adult Health

## 2018-07-02 ENCOUNTER — Other Ambulatory Visit: Payer: Self-pay | Admitting: Gastroenterology

## 2018-07-02 ENCOUNTER — Ambulatory Visit: Payer: Medicare Other | Admitting: Adult Health

## 2018-07-02 ENCOUNTER — Other Ambulatory Visit: Payer: Self-pay

## 2018-07-02 DIAGNOSIS — G4733 Obstructive sleep apnea (adult) (pediatric): Secondary | ICD-10-CM | POA: Diagnosis not present

## 2018-07-02 DIAGNOSIS — Z9989 Dependence on other enabling machines and devices: Secondary | ICD-10-CM

## 2018-07-02 NOTE — Progress Notes (Signed)
  [facility] [address] [place] [address] [phone]     Virtual Visit via Telephone Note  I connected with Jordan [name] on 07/02/18 at 10:00 AM EDT by telephone located remotely at [facility] and verified that I am speaking with the correct person using two identifiers who reports being located at home  I discussed the limitations, risks, security and privacy concerns of performing an evaluation and management service by telephone and the availability of in person appointments. I also discussed with the patient that there may be a patient responsible charge related to this service. The patient expressed understanding and agreed to proceed. See telephone note for consent and additional scheduling information.    History of Present Illness:  Jordan [name] is a 73 y.o. male who has been followed in this office for obstructive sleep apnea on CPAP.Jordan [name] was initially scheduled for face-to-face office follow up visit today time but due to [place], visit rescheduled for non-face-to-face telephone visit with patients consent. Unable to participate in video visit was unable to walk long.     Observations/Objective:  The patient CPAP download shows suboptimal compliance he uses machine every night for compliance of 100%.  He uses machine greater than 4 hours only 33.3%.  On average he uses his machine 3 hours and 29 minutes each night.  He states that sometimes he takes it off because his mouth is dry he also states that he needs new supplies.  He is on a pressure of 5 to 12 cm of water.  He does not have a significant leak.  Reports that he is tolerating the machine well.  Generalized: Well developed, in no acute distress   Neurological examination  Mentation: Alert oriented to time, place, history taking. Follows all commands speech and language fluent  Assessment and Plan:   1.  OSA on CPAP  The patient is encouraged to continue  using the CPAP nightly greater than 4 hours each night.  I will send an order to his DME company requesting new supplies specifically filters.  He is advised that if his symptoms worsen or he develops new symptoms he should let us know.  He will follow-up in 1 year or sooner if needed.  Follow Up Instructions:  F/u in 1 year with Virtual visit or in office visit.     I discussed the assessment and treatment plan with the patient.  The patient was provided an opportunity to ask questions and all were answered to their satisfaction. The patient agreed with the plan and verbalized an understanding of the instructions.   I provided 15 minutes of non-face-to-face time during this encounter-50% of this time was spent reviewing the patient's chart and CPAP download.  Ward Givens, NP   [facility] [address] [place], [place] [address]  Phone [phone] Fax [phone]

## 2018-07-15 ENCOUNTER — Telehealth: Payer: Self-pay

## 2018-07-15 NOTE — Telephone Encounter (Signed)
Follow up has been scheduled. Patient is aware of appt day and time.   

## 2018-12-02 ENCOUNTER — Other Ambulatory Visit: Payer: Self-pay | Admitting: Gastroenterology

## 2018-12-02 ENCOUNTER — Telehealth: Payer: Self-pay | Admitting: Gastroenterology

## 2018-12-02 MED ORDER — PANTOPRAZOLE SODIUM 40 MG PO TBEC: 40.0000 mg | DELAYED_RELEASE_TABLET | Freq: Every day | ORAL | 1 refills | Status: DC

## 2018-12-02 NOTE — Telephone Encounter (Signed)
Sent medication to pharmacy to get through until appointment

## 2018-12-28 ENCOUNTER — Other Ambulatory Visit: Payer: Self-pay | Admitting: Cardiovascular Disease

## 2019-01-15 ENCOUNTER — Other Ambulatory Visit: Payer: Self-pay

## 2019-01-15 MED ORDER — CARVEDILOL 3.125 MG PO TABS: 3.1250 mg | ORAL_TABLET | Freq: Every day | ORAL | 2 refills | Status: DC

## 2019-01-15 MED ORDER — PANTOPRAZOLE SODIUM 40 MG PO TBEC: 40.0000 mg | DELAYED_RELEASE_TABLET | Freq: Every day | ORAL | 0 refills | Status: DC

## 2019-01-15 MED ORDER — ROSUVASTATIN CALCIUM 5 MG PO TABS: 5.0000 mg | ORAL_TABLET | ORAL | 0 refills | Status: DC

## 2019-01-15 NOTE — Telephone Encounter (Signed)
Patient has appt 01/19/2019 , refill sent in so he won't run out of pantoprazole.

## 2019-01-15 NOTE — Telephone Encounter (Signed)
Rx(s) sent to pharmacy electronically.  

## 2019-01-19 ENCOUNTER — Encounter: Payer: Self-pay | Admitting: Gastroenterology

## 2019-01-19 ENCOUNTER — Ambulatory Visit: Payer: Medicare Other | Admitting: Gastroenterology

## 2019-01-19 VITALS — BP 130/76 | HR 68 | Temp 97.9°F | Ht 66.0 in | Wt 260.6 lb

## 2019-01-19 DIAGNOSIS — K219 Gastro-esophageal reflux disease without esophagitis: Secondary | ICD-10-CM | POA: Diagnosis not present

## 2019-01-19 DIAGNOSIS — K253 Acute gastric ulcer without hemorrhage or perforation: Secondary | ICD-10-CM

## 2019-01-19 MED ORDER — PANTOPRAZOLE SODIUM 40 MG PO TBEC: 40.0000 mg | DELAYED_RELEASE_TABLET | Freq: Every day | ORAL | 3 refills | Status: DC

## 2019-01-19 NOTE — Patient Instructions (Signed)
We have sent the following medications to your pharmacy for you to pick up at your convenience: pantoprazole.   Follow up with Dr. Ardis Hughs as needed.

## 2019-01-19 NOTE — Progress Notes (Signed)
Review of gastrointestinal problems:  1. Gastric ulcer, November 2010. EGD. Presented with an overt melenic GI bleed. Likely NSAID related. H. pylori biopsies negative. Repeat EGD 3 months later was normal. I recommended he stay on proton pump inhibitor once daily given its history gastric ulcer, permanent need for aspirin and Plavix. 2. routine risk for colon cancer:Colonoscopy by Dr. Ardis Hughs November 2011 small hyper plastic polyp. Internal hemorrhoids, minor diverticulosis. He was recommended to have repeat examination at 10 year interval for screening.   HPI: This is a very pleasant 73 year old man whom I last saw about 2 years ago.  He is here today for refills on his proton pump inhibitor.  He takes the Protonix 40 mg 1 pill once daily and on that regimen he has 0 GERD symptoms, 0 dysphagia.  He is still very good about avoiding NSAIDs and he only takes Tylenol 2 pills twice daily generally for his variety of arthritis pains.  He has no abdominal pains, no troubles with his bowels.  No overt GI bleeding.  He is very regular with his bowel movements especially since he takes Metamucil daily and eats 25 blueberries every day as well.  He has intentionally lost about 20 pounds since his last visit here 2 years ago with diet manipulation.   Chief complaint is history of gastric ulcer, GERD  ROS: complete GI ROS as described in HPI, all other review negative.  Constitutional:  No unintentional weight loss   Past Medical History:  Diagnosis Date  . Arthritis   . CAD (coronary artery disease)   . Colon polyps 01/23/2010   Descending  . Diabetes mellitus   . Diverticulosis   . Gastric ulcer   . GI bleed   . Hypertension   . Hypotension    transient  . OSA (obstructive sleep apnea)    Severe, on CPAP therapy    Past Surgical History:  Procedure Laterality Date  . CARDIAC CATHETERIZATION  05/01/2007   Recommend rotational atherectomy followed by PTCA and probable stenting of  LAD.  Marland Kitchen CARDIAC CATHETERIZATION  05/28/2007   Mid LAD hihgh grade 80-90% stenosis, stented with a 3x27mm Cypher stent implanted at 16atm, postdilated with a 3.25x52mm Quantum at 16atm, which revealed excellent wall appoistion - 5.88mm  . CARDIOVASCULAR STRESS TEST  02/05/2011   Perfusion defect seen in inferior myocardial region consistent with diagphragmatic attenuation. Remaining myocardium demonstrates normal myocardial perfusion with no evidence of ischemia or infarct. ECG is positive for ischemia.  . CPAP/BIPAP SLEEP STUDY  07/01/2007   AHI-0.5/hr at 11cm water pressure, RDI-12.6/hr  . LOWER ARTERIAL DOPPLER  06/03/2007   No evidence of dissection, AV fistula, or active pseudoaneurysm.  Marland Kitchen SLEEP STUDY  05/13/2007   AHI-11.52/hr, AHI REM-56.0/hr, RDI-11.7/hr, RDI REM-56.0/hr, avg oxygen sat-94%  . TRANSTHORACIC ECHOCARDIOGRAM  03/13/2010   EF 123456, normal diastolic function, mild MR, mild TR, and normal RVSP    Current Outpatient Medications  Medication Sig Dispense Refill  . acetaminophen (TYLENOL 8 HOUR) 650 MG CR tablet Take 650 mg by mouth every 8 (eight) hours as needed.      Marland Kitchen Apoaequorin (PREVAGEN PO) Take 20 mg by mouth daily.    Marland Kitchen aspirin 81 MG tablet Take 81 mg by mouth daily.      . carvedilol (COREG) 3.125 MG tablet Take 1 tablet (3.125 mg total) by mouth daily. 90 tablet 2  . Cholecalciferol (VITAMIN D3 PO) Take 2,000 Units by mouth daily.    Marland Kitchen CINNAMON PO Take 1 tablet  by mouth 2 (two) times daily. 1000 mg tablets    . clopidogrel (PLAVIX) 75 MG tablet Take 1 tablet (75 mg total) by mouth daily. 90 tablet 3  . FIBER ADULT GUMMIES PO Take 2-3 Doses by mouth daily.    . fluticasone (CUTIVATE) 0.05 % cream Apply 1 application topically daily.     . fluticasone (FLONASE) 50 MCG/ACT nasal spray Place 1 spray into both nostrils 2 (two) times daily.    . Garlic Oil 123XX123 MG CAPS Take by mouth. 1 time daily      . ketoconazole (NIZORAL) 2 % cream Apply 1 application topically daily.     Marland Kitchen lisinopril (ZESTRIL) 5 MG tablet TAKE 1/2 TABLET BY MOUTH ONCE DAILY. 45 tablet 0  . metFORMIN (GLUCOPHAGE-XR) 500 MG 24 hr tablet Take 500 mg by mouth daily with breakfast.      . Misc Natural Products (TART CHERRY ADVANCED PO) Take 433 mg by mouth daily.    . NON FORMULARY Beta Prostate. Take 1 capsule by mouth twice daily.    . NON FORMULARY CPAP    . Nutritional Supplements (NUTRITIONAL SUPPLEMENT PO) Take 3 tablets by mouth daily. Pt takes Enhanced Oral Chelation II    . Omega-3 Fatty Acids (FISH OIL) 1000 MG CAPS Take by mouth. 1 by mouth 2 times daily      . orlistat (ALLI) 60 MG capsule Take 60 mg by mouth 2 (two) times daily.    . pantoprazole (PROTONIX) 40 MG tablet Take 1 tablet (40 mg total) by mouth daily. 90 tablet 0  . Psyllium (METAMUCIL PO) Take 1 Dose by mouth daily.    . rosuvastatin (CRESTOR) 5 MG tablet Take 1 tablet (5 mg total) by mouth every other day. 45 tablet 0  . sildenafil (VIAGRA) 100 MG tablet Take 100 mg by mouth daily as needed.     . traMADol (ULTRAM) 50 MG tablet Take 50 mg by mouth every 6 (six) hours as needed.    Marland Kitchen UNABLE TO FIND Take 2 tablets by mouth daily. Pt takes [employer]    . vitamin B-12 (CYANOCOBALAMIN) 500 MCG tablet Take 1,000 mcg by mouth daily.    . vitamin C (ASCORBIC ACID) 500 MG tablet Take 1,000 mg by mouth 2 (two) times daily.     . vitamin E 400 UNIT capsule Take 400 Units by mouth daily.     No current facility-administered medications for this visit.     Allergies as of 01/19/2019 - Review Complete 01/19/2019  Allergen Reaction Noted  . Codeine    . Statins Other (See Comments) 07/02/2016    Family History  Problem Relation Age of Onset  . Stroke Brother   . Hypertension Brother   . Alzheimer's disease Maternal Grandmother   . Stroke Maternal Grandfather   . Heart disease Maternal Grandfather   . Stroke Paternal Grandmother   . Heart attack Paternal Grandfather   . Hypertension Father   . Alzheimer's disease  Father   . Parkinson's disease Father   . Alzheimer's disease Mother     Social History   Socioeconomic History  . Marital status: Married    Spouse name: Not on file  . Number of children: 2  . Years of education: Not on file  . Highest education level: Not on file  Occupational History  . Occupation: retired  Scientific laboratory technician  . Financial resource strain: Not on file  . Food insecurity    Worry: Not on file  Inability: Not on file  . Transportation needs    Medical: Not on file    Non-medical: Not on file  Tobacco Use  . Smoking status: Former Smoker    Quit date: 01/28/1978    Years since quitting: 41.0  . Smokeless tobacco: Never Used  Substance and Sexual Activity  . Alcohol use: Yes    Comment: rarely  . Drug use: No  . Sexual activity: Not on file  Lifestyle  . Physical activity    Days per week: Not on file    Minutes per session: Not on file  . Stress: Not on file  Relationships  . Social Herbalist on phone: Not on file    Gets together: Not on file    Attends religious service: Not on file    Active member of club or organization: Not on file    Attends meetings of clubs or organizations: Not on file    Relationship status: Not on file  . Intimate partner violence    Fear of current or ex partner: Not on file    Emotionally abused: Not on file    Physically abused: Not on file    Forced sexual activity: Not on file  Other Topics Concern  . Not on file  Social History Narrative  . Not on file     Physical Exam: BP 130/76   Pulse 68   Temp 97.9 F (36.6 C)   Ht 5\' 6"  (1.676 m)   Wt 260 lb 9.6 oz (118.2 kg)   BMI 42.06 kg/m  Constitutional: generally well-appearing Psychiatric: alert and oriented x3 Abdomen: soft, nontender, nondistended, no obvious ascites, no peritoneal signs, normal bowel sounds No peripheral edema noted in lower extremities  Assessment and plan: 73 y.o. male with GERD, history of gastric ulcer, routine risk  for colon cancer  First I am happy to refill his proton pump inhibitor Protonix and he will continue taking 1 pill once daily indefinitely.  I am happy to refill it again next year but if he still requires proton pump inhibitors on a prescription basis in 2 years I would like to see him again in the office.  He has no GI alarm symptoms  He is at routine risk for colon cancer and he and I discussed the fact that colon cancer screening generally stops around the age of 50.  His last screening colonoscopy was 2011 in November.  He prefers not to have further colon cancer screening and I support that decision, he does understand that if he has any significant GI issues that he should call anytime.  Please see the "Patient Instructions" section for addition details about the plan.  Owens Loffler, MD [facility] 01/19/2019, 9:11 AM

## 2019-01-20 ENCOUNTER — Other Ambulatory Visit: Payer: Self-pay

## 2019-01-20 ENCOUNTER — Ambulatory Visit: Payer: Medicare Other | Admitting: Cardiovascular Disease

## 2019-01-20 ENCOUNTER — Encounter: Payer: Self-pay | Admitting: Cardiovascular Disease

## 2019-01-20 VITALS — BP 139/80 | HR 77 | Temp 96.8°F | Ht 66.0 in | Wt 243.0 lb

## 2019-01-20 DIAGNOSIS — E785 Hyperlipidemia, unspecified: Secondary | ICD-10-CM

## 2019-01-20 DIAGNOSIS — E1159 Type 2 diabetes mellitus with other circulatory complications: Secondary | ICD-10-CM

## 2019-01-20 DIAGNOSIS — I2584 Coronary atherosclerosis due to calcified coronary lesion: Secondary | ICD-10-CM

## 2019-01-20 DIAGNOSIS — I2510 Atherosclerotic heart disease of native coronary artery without angina pectoris: Secondary | ICD-10-CM

## 2019-01-20 DIAGNOSIS — Z9989 Dependence on other enabling machines and devices: Secondary | ICD-10-CM

## 2019-01-20 DIAGNOSIS — G4733 Obstructive sleep apnea (adult) (pediatric): Secondary | ICD-10-CM | POA: Diagnosis not present

## 2019-01-20 NOTE — Progress Notes (Signed)
Patient ID: Jordan [name], male   DOB: 08/26/1945, 73 y.o.   MRN: [id number]   Primary MD: [name]  HPI: Jordan [name] is a 73 y.o. male who presents to the office today for a 34 month cardiology evaluation.  Jordan [name] is a 65 -year-old African-American American gentleman who has a history of CAD , severe obstructive sleep apnea, morbid obesity, type 2 diabetes mellitus, mild essential hypertension, GERD, as well as erectile dysfunction. In April 2009 he was found to have high-grade focally calcified LAD stenosis and underwent successful high-speed rotational atherectomy and DES stenting with insertion of a 3.033 mm Cypher stent.  He has continued to be on long-term antiplatelet therapy with aspirin and Plavix.  He continues to be active.  He denies any recurrent anginal type symptomatology. His nuclear stress test in 2012 continue to suggest patency of this vessel with normal perfusion.  A two-year followup nuclear perfusion study  on 01/28/2013 demonstrated  normal perfusion without scar or ischemia;  EF was 56%.  He has a history of severe  obstructive sleep apnea originally diagnosed in 2009 and he has been on CPAP therapy since that time with 100% compliance.  He recently received a new CPAP machine after his old machine had begun to malfunction.  He was recently placed on a nasal mask and is followed by Dr.Domeiher.  He has a history of hyperlipidemia and when last seen had begun to have issues with simvastatin causing some myalgias.  I suggest that he change this to Crestor and he is now been on a very low-dose at 5 mg and has been taking this 2 times per week.  He has tolerated this.  Repeat blood work in May 2017 showed a total cholesterol of 153, triglycerides 80, HDL 45, and LDL 92.  An echo Doppler study on 05/11/2015 showed an ejection fraction at 55%; there were no regional wall motion abnormalities but there was  grade 2 diastolic dysfunction.  There was mitral annular calcification without  regurgitation.  He normal pulmonic pressures.  His aortic valve was normal.  In the past.  He has had bilateral knee discomfort and  underwent several injections as well as physical therapy for improvement.  He continues to be followed Dr. Iona Beard , for primary care.  I  saw him in November 2018 at which time he was continuing to do well from a cardiac standpoint and was without chest pain or shortness of breath.   When I last saw him in October 2019 Jordan [name] continued to do well from a cardiac standpoint.  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  He continued to swim at least 5 days/week and 1/2 mile a time doing doggy paddle.  He does admit to some fatigability.  He continued to use CPAP with 100% compliance and uses a nasal mask.  He was on aspirin and Plavix for antiplatelet therapy, lisinopril 2.5 mg for hypertension.  He is diabetic on metformin.  He continued to take rosuvastatin 5 mg for hyperlipidemia and Protonix for GERD.    Overthe past year, Jordan [name] has continued to do exceptionally well.  He has had purposeful weight loss and over the past year has lost over 45 pounds.  He continues to use CPAP with 1% compliance and is followed by Dr. Brett Fairy for his sleep apnea.  He also had been seeing a chiropractor who had assisted in weight loss.  He denies any recurrent angina.  He is unaware  of palpitations.  He had undergone laboratory in September 2020.  Lipid studies were excellent with total cholesterol 113, HDL 49, LDL 51, and triglycerides 53.  Hemoglobin A1c was stable at 5.7.  Renal function was normal at 0.84.  He presents for an annual evaluation.   Past Medical History:  Diagnosis Date  . Arthritis   . CAD (coronary artery disease)   . Colon polyps 01/23/2010   Descending  . Diabetes mellitus   . Diverticulosis   . Gastric ulcer   . GI bleed   . Hypertension   . Hypotension    transient  . OSA (obstructive sleep apnea)    Severe, on CPAP therapy    Past  Surgical History:  Procedure Laterality Date  . CARDIAC CATHETERIZATION  05/01/2007   Recommend rotational atherectomy followed by PTCA and probable stenting of LAD.  Marland Kitchen CARDIAC CATHETERIZATION  05/28/2007   Mid LAD hihgh grade 80-90% stenosis, stented with a 3x52m Cypher stent implanted at 16atm, postdilated with a 3.25x285mQuantum at 16atm, which revealed excellent wall appoistion - 5.7961m. CARDIOVASCULAR STRESS TEST  02/05/2011   Perfusion defect seen in inferior myocardial region consistent with diagphragmatic attenuation. Remaining myocardium demonstrates normal myocardial perfusion with no evidence of ischemia or infarct. ECG is positive for ischemia.  . CPAP/BIPAP SLEEP STUDY  07/01/2007   AHI-0.5/hr at 11cm water pressure, RDI-12.6/hr  . LOWER ARTERIAL DOPPLER  06/03/2007   No evidence of dissection, AV fistula, or active pseudoaneurysm.  . SMarland KitchenEEP STUDY  05/13/2007   AHI-11.52/hr, AHI REM-56.0/hr, RDI-11.7/hr, RDI REM-56.0/hr, avg oxygen sat-94%  . TRANSTHORACIC ECHOCARDIOGRAM  03/13/2010   EF >55>68%ormal diastolic function, mild MR, mild TR, and normal RVSP    Allergies  Allergen Reactions  . Codeine   . Statins Other (See Comments)    Muscle aches Muscle aches    Current Outpatient Medications  Medication Sig Dispense Refill  . acetaminophen (TYLENOL 8 HOUR) 650 MG CR tablet Take 650 mg by mouth every 8 (eight) hours as needed.      . AMarland Kitchenoaequorin (PREVAGEN PO) Take 20 mg by mouth daily.    . aMarland Kitchenpirin 81 MG tablet Take 81 mg by mouth daily.      . carvedilol (COREG) 3.125 MG tablet Take 1 tablet (3.125 mg total) by mouth daily. 90 tablet 2  . Cholecalciferol (VITAMIN D3 PO) Take 2,000 Units by mouth daily.    . CMarland KitchenNNAMON PO Take 1 tablet by mouth 2 (two) times daily. 1000 mg tablets    . clopidogrel (PLAVIX) 75 MG tablet Take 1 tablet (75 mg total) by mouth daily. 90 tablet 3  . FIBER ADULT GUMMIES PO Take 2-3 Doses by mouth daily.    . fluticasone (CUTIVATE) 0.05 % cream Apply 1  application topically daily.     . fluticasone (FLONASE) 50 MCG/ACT nasal spray Place 1 spray into both nostrils 2 (two) times daily.    . kMarland Kitchentoconazole (NIZORAL) 2 % cream Apply 1 application topically daily.    . lMarland Kitchensinopril (ZESTRIL) 5 MG tablet TAKE 1/2 TABLET BY MOUTH ONCE DAILY. 45 tablet 0  . metFORMIN (GLUCOPHAGE-XR) 500 MG 24 hr tablet Take 500 mg by mouth daily with breakfast.      . Misc Natural Products (TART CHERRY ADVANCED PO) Take 433 mg by mouth daily.    . NON FORMULARY Beta Prostate. Take 1 capsule by mouth twice daily.    . NON FORMULARY CPAP    . pantoprazole (PROTONIX) 40 MG tablet Take  1 tablet (40 mg total) by mouth daily. 90 tablet 3  . Psyllium (METAMUCIL PO) Take 1 Dose by mouth daily.    . rosuvastatin (CRESTOR) 5 MG tablet Take 1 tablet (5 mg total) by mouth every other day. 45 tablet 0  . sildenafil (VIAGRA) 100 MG tablet Take 100 mg by mouth daily as needed.     Marland Kitchen UNABLE TO FIND Take 2 tablets by mouth daily. Pt takes [employer]    . vitamin B-12 (CYANOCOBALAMIN) 500 MCG tablet Take 1,000 mcg by mouth daily.    . vitamin C (ASCORBIC ACID) 500 MG tablet Take 1,000 mg by mouth 2 (two) times daily.      No current facility-administered medications for this visit.     Social History   Socioeconomic History  . Marital status: Married    Spouse name: Not on file  . Number of children: 2  . Years of education: Not on file  . Highest education level: Not on file  Occupational History  . Occupation: retired  Scientific laboratory technician  . Financial resource strain: Not on file  . Food insecurity    Worry: Not on file    Inability: Not on file  . Transportation needs    Medical: Not on file    Non-medical: Not on file  Tobacco Use  . Smoking status: Former Smoker    Quit date: 01/28/1978    Years since quitting: 41.0  . Smokeless tobacco: Never Used  Substance and Sexual Activity  . Alcohol use: Yes    Comment: rarely  . Drug use: No  . Sexual activity: Not on file   Lifestyle  . Physical activity    Days per week: Not on file    Minutes per session: Not on file  . Stress: Not on file  Relationships  . Social Herbalist on phone: Not on file    Gets together: Not on file    Attends religious service: Not on file    Active member of club or organization: Not on file    Attends meetings of clubs or organizations: Not on file    Relationship status: Not on file  . Intimate partner violence    Fear of current or ex partner: Not on file    Emotionally abused: Not on file    Physically abused: Not on file    Forced sexual activity: Not on file  Other Topics Concern  . Not on file  Social History Narrative  . Not on file   Socially he is married and has 2 children. No tobacco or alcohol use.  Family History  Problem Relation Age of Onset  . Stroke Brother   . Hypertension Brother   . Alzheimer's disease Maternal Grandmother   . Stroke Maternal Grandfather   . Heart disease Maternal Grandfather   . Stroke Paternal Grandmother   . Heart attack Paternal Grandfather   . Hypertension Father   . Alzheimer's disease Father   . Parkinson's disease Father   . Alzheimer's disease Mother     ROS General: Negative; No fevers, chills, or night sweats; mild fatigue HEENT: Negative; No changes in vision or hearing, sinus congestion, difficulty swallowing Pulmonary: Negative; No cough, wheezing, shortness of breath, hemoptysis Cardiovascular: See history of present illness; No chest pain, presyncope, syncope, palpitations GI: GERD, controlled with pantoprazole GU: Positive for erectile dysfunction; No dysuria, hematuria, or difficulty voiding Musculoskeletal: Occasional knee discomfort Hematologic/Oncology: Negative; no easy bruising, bleeding Endocrine: Negative;  no heat/cold intolerance; no diabetes Neuro: Negative; no changes in balance, headaches Skin: Negative; No rashes or skin lesions Psychiatric: Negative; No behavioral problems,  deprression. Sleep: Positive for obstructive sleep apnea which is severe, currently on CPAP with 100% compliance.  He states oftentimes he takes his mask off when he goes to the bathroom but does not put it back on after going back to see bed Other comprehensive 14 point system review is negative.  PE BP 139/80   Pulse 77   Temp (!) 96.8 F (36 C)   Ht '5\' 6"'  (1.676 m)   Wt 243 lb (110.2 kg)   SpO2 98%   BMI 39.22 kg/m    Repeat blood pressure by me 116/78  Wt Readings from Last 3 Encounters:  01/20/19 243 lb (110.2 kg)  01/19/19 260 lb 9.6 oz (118.2 kg)  01/13/18 288 lb (130.6 kg)    General: Alert, oriented, no distress.  Skin: normal turgor, no rashes, warm and dry HEENT: Normocephalic, atraumatic. Pupils equal round and reactive to light; sclera anicteric; extraocular muscles intact;  Nose without nasal septal hypertrophy Mouth/Parynx benign; Mallinpatti scale 3 Neck: No JVD, no carotid bruits; normal carotid upstroke Lungs: clear to ausculatation and percussion; no wheezing or rales Chest wall: without tenderness to palpitation Heart: PMI not displaced, RRR, s1 s2 normal, 1/6 systolic murmur, no diastolic murmur, no rubs, gallops, thrills, or heaves Abdomen: soft, nontender; no hepatosplenomehaly, BS+; abdominal aorta nontender and not dilated by palpation. Back: no CVA tenderness Pulses 2+ Musculoskeletal: full range of motion, normal strength, no joint deformities Extremities: no clubbing cyanosis or edema, Homan's sign negative  Neurologic: grossly nonfocal; Cranial nerves grossly wnl Psychologic: Normal mood and affect   ECG (independently read by me): Sinus Bradycardia ay 56; No ST changes; no ectopy; normal intervals   October 2019 ECG (independently read by me): Sinus bradycardia at 46 bpm.  No ectopy.  Normal intervals.  November 2018 ECG (independently read by me): Sinus bradycardia 43 bpm normal intervals.  No ST segment changes.  May 2018 ECG  (independently read by me): Sinus bradycardia 47 bpm.  No ST segment changes.  Normal intervals.  ECG (independently read by me): Sinus bradycardia 52 bpm.  No significant ST changes.  Normal intervals.  June 2017 ECG (independently read by me): Sinus bradycardia 50 bpm.  No ectopy.  February 2017 ECG (independently read by me):  Sinus bradycardia with occasional PVC.  Heart rate 55 bpm.  QTc interval 369 ms.  February 2016 ECG (independently read by me): Sinus bradycardia 50 bpm.  No ectopy.  Normal intervals.   LABS:  BMP Latest Ref Rng & Units 10/03/2016 11/17/2015 01/25/2009  Glucose 65 - 99 mg/dL 97 127(H) 95  BUN 7 - 25 mg/dL '12 9 16  ' Creatinine 0.70 - 1.18 mg/dL 0.84 0.87 0.73  Sodium 135 - 146 mmol/L 139 139 135  Potassium 3.5 - 5.3 mmol/L 4.3 4.2 3.5 DELTA CHECK NOTED REPEATED TO VERIFY  Chloride 98 - 110 mmol/L 103 103 108  CO2 20 - 32 mmol/L '26 29 25  ' Calcium 8.6 - 10.3 mg/dL 9.1 9.0 7.8(L)   Hepatic Function Latest Ref Rng & Units 10/03/2016 11/17/2015 06/30/2015  Total Protein 6.1 - 8.1 g/dL 6.8 7.0 6.9  Albumin 3.6 - 5.1 g/dL 3.8 3.8 3.9  AST 10 - 35 U/L '15 16 13  ' ALT 9 - 46 U/L '14 14 14  ' Alk Phosphatase 40 - 115 U/L 67 65 69  Total Bilirubin 0.2 -  1.2 mg/dL 0.4 0.6 0.5  Bilirubin, Direct <=0.2 mg/dL - - 0.1   CBC Latest Ref Rng & Units 03/28/2009 01/26/2009 01/25/2009  WBC 4.5 - 10.5 10*3/microliter 10.3 10.8(H) 11.8(H)  Hemoglobin 13.0 - 17.0 g/dL 13.3 8.4(L) 8.6(L)  Hematocrit 39.0 - 52.0 % 40.9 24.8(L) 25.3(L)  Platelets 150.0 - 400.0 K/uL 234.0 190 184   Lab Results  Component Value Date   MCV 90.6 03/28/2009   MCV 91.6 01/26/2009   MCV 90.8 01/25/2009   No results found for: TSH   No results found for: HGBA1C  Lipid Panel     Component Value Date/Time   CHOL 108 10/03/2016 0740   TRIG 48 10/03/2016 0740   HDL 49 10/03/2016 0740   CHOLHDL 2.2 10/03/2016 0740   VLDL 10 10/03/2016 0740   LDLCALC 49 10/03/2016 0740     RADIOLOGY: No results  found.  IMPRESSION:  1. Coronary artery disease due to calcified coronary lesion; status post rotational atherectomy/DES April 2009 to LAD   2. Hyperlipidemia with target LDL less than 70   3. Type 2 diabetes mellitus with other circulatory complications ([place])   4. OSA on CPAP   5. Morbid obesity ([place])     ASSESSMENT AND PLAN: Mr. Damaris [name] is a 73 year-old African-American male who has a  history of morbid obesity, hypertension, obstructive sleep apnea, and CAD.  He underwent high-speed rotational artherectomy of the severely calcified focal LAD stenosis with insertion of a 3.0x30 mm DES Cypher stent in April 2009.  He has continued to do exceptionally well and denies any recurrent anginal symptomatology.  His last nuclear perfusion study in December 2014  demonstrated normal perfusion without scar or ischemia, ejection fraction was 56%.  I last saw him, he was swimming at least 5 days/week.  Over the past year, he has not been able to swim due to the COVID-19 pandemic.  I commended him on his purposeful weight loss and since his last evaluation his weight is reduced from 288 pounds down to 243 pounds today.  His blood pressure today is stable on lisinopril 2.5 mg and carvedilol 3.125 mg twice a day..  I reviewed most recent laboratory.  Blood studies are excellent on rosuvastatin 5 mg daily with recent LDL cholesterol at 51.  He continues to be on dual antiplatelet therapy and is tolerating this well.  He is diabetic on metformin and most recent hemoglobin A1c was excellent at 5.7.  Plans to continue with weight loss.  He continues to use CPAP with 100% compliance.  He is sleeping well.  I will see him in 1 year for reevaluation or sooner as needed.   Time spent: 25 minutes Jordan Sine, MD, [facility]  01/22/2019 5:02 PM

## 2019-01-20 NOTE — Patient Instructions (Signed)
Medication Instructions:  Your physician recommends that you continue on your current medications as directed. Please refer to the Current Medication list given to you today.  *If you need a refill on your cardiac medications before your next appointment, please call your pharmacy*  Lab Work: none If you have labs (blood work) drawn today and your tests are completely normal, you will receive your results only by: Marland Kitchen MyChart Message (if you have MyChart) OR . A paper copy in the mail If you have any lab test that is abnormal or we need to change your treatment, we will call you to review the results.  Testing/Procedures: none  Follow-Up: At [facility], you and your health needs are our priority.  As part of our continuing mission to provide you with exceptional heart care, we have created designated Provider Care Teams.  These Care Teams include your primary Cardiologist (physician) and Advanced Practice Providers (APPs -  Physician Assistants and Nurse Practitioners) who all work together to provide you with the care you need, when you need it.  Your next appointment:   12 month(s)  The format for your next appointment:   In Person  Provider:   You may see DR. KELLY or one of the following Advanced Practice Providers on your designated Care Team:    Almyra Deforest, PA-C  Fabian Sharp, PA-C or   Roby Lofts, [place]

## 2019-01-22 ENCOUNTER — Encounter: Payer: Self-pay | Admitting: Cardiovascular Disease

## 2019-01-26 ENCOUNTER — Other Ambulatory Visit: Payer: Self-pay | Admitting: Cardiovascular Disease

## 2019-01-26 NOTE — Telephone Encounter (Signed)
°*  STAT* If patient is at the pharmacy, call can be transferred to refill team.   1. Which medications need to be refilled? (please list name of each medication and dose if known)  carvedilol (COREG) 3.125 MG tablet lisinopril (ZESTRIL) 5 MG tablet  2. Which pharmacy/location (including street and city if local pharmacy) is medication to be sent to?  [place], [place]  3. Do they need a 30 day or 90 day supply? 90 days

## 2019-01-27 MED ORDER — LISINOPRIL 5 MG PO TABS: 2.5000 mg | ORAL_TABLET | Freq: Every day | ORAL | 2 refills | Status: DC

## 2019-01-27 MED ORDER — CARVEDILOL 3.125 MG PO TABS: 3.1250 mg | ORAL_TABLET | Freq: Every day | ORAL | 2 refills | Status: DC

## 2019-01-27 NOTE — Telephone Encounter (Signed)
Requested Prescriptions   Signed Prescriptions Disp Refills  . carvedilol (COREG) 3.125 MG tablet 90 tablet 2    Sig: Take 1 tablet (3.125 mg total) by mouth daily.    Authorizing Provider: Shelva Majestic A    Ordering User: NEWCOMER MCCLAIN, BRANDY L  . lisinopril (ZESTRIL) 5 MG tablet 45 tablet 2    Sig: Take 0.5 tablets (2.5 mg total) by mouth daily.    Authorizing Provider: Troy Sine    Ordering User: Raelene Bott, BRANDY L

## 2019-02-13 ENCOUNTER — Other Ambulatory Visit: Payer: Self-pay

## 2019-02-13 ENCOUNTER — Encounter: Payer: Self-pay | Admitting: Emergency Medicine

## 2019-02-13 ENCOUNTER — Ambulatory Visit
Admission: EM | Admit: 2019-02-13 | Discharge: 2019-02-13 | Disposition: A | Discharge status: Home / Self Care | Payer: Medicare Other | Attending: Emergency Medicine | Admitting: Emergency Medicine

## 2019-02-13 DIAGNOSIS — Z711 Person with feared health complaint in whom no diagnosis is made: Secondary | ICD-10-CM

## 2019-02-13 DIAGNOSIS — Z20822 Contact with and (suspected) exposure to covid-19: Secondary | ICD-10-CM

## 2019-02-13 DIAGNOSIS — Z20828 Contact with and (suspected) exposure to other viral communicable diseases: Secondary | ICD-10-CM

## 2019-02-13 NOTE — ED Provider Notes (Signed)
[facility]    CSN: [id number] Arrival date & time: 02/13/19  1350      History   Chief Complaint No chief complaint on file.   HPI Jordan [name] is a 73 y.o. male.   Jordan [name] 73 years old male presents to the urgent care for COVID testing.  Denies sick exposure to COVID, flu or strep.  Denies recent travel.  Denies aggravating or alleviating symptoms.  Denies previous COVID infection.   Denies fever, chills, fatigue, nasal congestion, rhinorrhea, sore throat, cough, SOB, wheezing, chest pain, nausea, vomiting, changes in bowel or bladder habits.    The history is provided by the patient. No language interpreter was used.    Past Medical History:  Diagnosis Date  . Arthritis   . CAD (coronary artery disease)   . Colon polyps 01/23/2010   Descending  . Diabetes mellitus   . Diverticulosis   . Gastric ulcer   . GI bleed   . Hypertension   . Hypotension    transient  . OSA (obstructive sleep apnea)    Severe, on CPAP therapy    Patient Active Problem List   Diagnosis Date Noted  . Type 2 diabetes mellitus with other circulatory complications ([place]) 0000000  . History of gastric ulcer 05/23/2014  . OSA on CPAP 03/04/2013  . Morbid obesity ([name]) 03/04/2013  . CAD (coronary artery disease) 03/04/2013  . Hyperlipidemia with target LDL less than 70 03/04/2013  . Type 2 diabetes mellitus ([place]) 03/04/2013  . GERD (gastroesophageal reflux disease) 03/04/2013  . Vertigo 03/04/2013  . Acute gastric ulcer 03/28/2009    Past Surgical History:  Procedure Laterality Date  . CARDIAC CATHETERIZATION  05/01/2007   Recommend rotational atherectomy followed by PTCA and probable stenting of LAD.  Marland Kitchen CARDIAC CATHETERIZATION  05/28/2007   Mid LAD hihgh grade 80-90% stenosis, stented with a 3x29mm Cypher stent implanted at 16atm, postdilated with a 3.25x62mm Quantum at 16atm, which revealed excellent wall appoistion - 5.35mm  . CARDIOVASCULAR STRESS TEST  02/05/2011   Perfusion defect seen in inferior myocardial region consistent with diagphragmatic attenuation. Remaining myocardium demonstrates normal myocardial perfusion with no evidence of ischemia or infarct. ECG is positive for ischemia.  . CPAP/BIPAP SLEEP STUDY  07/01/2007   AHI-0.5/hr at 11cm water pressure, RDI-12.6/hr  . LOWER ARTERIAL DOPPLER  06/03/2007   No evidence of dissection, AV fistula, or active pseudoaneurysm.  Marland Kitchen SLEEP STUDY  05/13/2007   AHI-11.52/hr, AHI REM-56.0/hr, RDI-11.7/hr, RDI REM-56.0/hr, avg oxygen sat-94%  . TRANSTHORACIC ECHOCARDIOGRAM  03/13/2010   EF 123456, normal diastolic function, mild MR, mild TR, and normal RVSP       Home Medications    Prior to Admission medications   Medication Sig Start Date End Date Taking? Authorizing Provider  acetaminophen (TYLENOL 8 HOUR) 650 MG CR tablet Take 650 mg by mouth every 8 (eight) hours as needed.      [provider]  Apoaequorin (PREVAGEN PO) Take 20 mg by mouth daily.    [provider]  aspirin 81 MG tablet Take 81 mg by mouth daily.      [provider]  carvedilol (COREG) 3.125 MG tablet Take 1 tablet (3.125 mg total) by mouth daily. 01/27/19   Troy Sine, MD  Cholecalciferol (VITAMIN D3 PO) Take 2,000 Units by mouth daily.    [provider]  CINNAMON PO Take 1 tablet by mouth 2 (two) times daily. 1000 mg tablets    [provider]  clopidogrel (PLAVIX) 75 MG tablet Take 1 tablet (75 mg total) by mouth daily. 12/15/17   Troy Sine, MD  FIBER ADULT GUMMIES PO Take 2-3 Doses by mouth daily.    [provider]  fluticasone (CUTIVATE) 0.05 % cream Apply 1 application topically daily.  03/16/14   [provider]  fluticasone (FLONASE) 50 MCG/ACT nasal spray Place 1 spray into both nostrils 2 (two) times daily.    [provider]  ketoconazole (NIZORAL) 2 % cream Apply 1 application topically daily.    [provider]  lisinopril (ZESTRIL) 5  MG tablet Take 0.5 tablets (2.5 mg total) by mouth daily. 01/27/19   Troy Sine, MD  metFORMIN (GLUCOPHAGE-XR) 500 MG 24 hr tablet Take 500 mg by mouth daily with breakfast.      [provider]  Misc Natural Products (TART CHERRY ADVANCED PO) Take 433 mg by mouth daily.    [provider]  NON FORMULARY Beta Prostate. Take 1 capsule by mouth twice daily.    [provider]  NON FORMULARY CPAP    [provider]  pantoprazole (PROTONIX) 40 MG tablet Take 1 tablet (40 mg total) by mouth daily. 01/19/19   Milus Banister, MD  Psyllium (METAMUCIL PO) Take 1 Dose by mouth daily.    [provider]  rosuvastatin (CRESTOR) 5 MG tablet Take 1 tablet (5 mg total) by mouth every other day. 01/15/19   Troy Sine, MD  sildenafil (VIAGRA) 100 MG tablet Take 100 mg by mouth daily as needed.     [provider]  UNABLE TO FIND Take 2 tablets by mouth daily. Pt takes [employer]    [provider]  vitamin B-12 (CYANOCOBALAMIN) 500 MCG tablet Take 1,000 mcg by mouth daily.    [provider]  vitamin C (ASCORBIC ACID) 500 MG tablet Take 1,000 mg by mouth 2 (two) times daily.     [provider]    Family History Family History  Problem Relation Age of Onset  . Stroke Brother   . Hypertension Brother   . Alzheimer's disease Maternal Grandmother   . Stroke Maternal Grandfather   . Heart disease Maternal Grandfather   . Stroke Paternal Grandmother   . Heart attack Paternal Grandfather   . Hypertension Father   . Alzheimer's disease Father   . Parkinson's disease Father   . Alzheimer's disease Mother     Social History Social History   Tobacco Use  . Smoking status: Former Smoker    Quit date: 01/28/1978    Years since quitting: 41.0  . Smokeless tobacco: Never Used  Substance Use Topics  . Alcohol use: Yes    Comment: rarely  . Drug use: No     Allergies   Codeine and Statins   Review of Systems  Review of Systems  Constitutional: Negative.   HENT: Negative.   Respiratory: Negative.   Cardiovascular: Negative.   Gastrointestinal: Negative.   Neurological: Negative.      Physical Exam Triage Vital Signs ED Triage Vitals  Enc Vitals Group     BP 02/13/19 1411 (!) 153/79     Pulse Rate 02/13/19 1411 62     Resp 02/13/19 1411 20     Temp 02/13/19 1411 98.8 F (37.1 C)     Temp src --      SpO2 02/13/19 1411 97 %     Weight --      Height --  Head Circumference --      Peak Flow --      Pain Score 02/13/19 1410 0     Pain Loc --      Pain Edu? --      Excl. in [place]? --    No data found.  Updated Vital Signs BP (!) 153/79   Pulse 62   Temp 98.8 F (37.1 C)   Resp 20   SpO2 97%   Visual Acuity Right Eye Distance:   Left Eye Distance:   Bilateral Distance:    Right Eye Near:   Left Eye Near:    Bilateral Near:     Physical Exam Constitutional:      General: He is not in acute distress.    Appearance: Normal appearance. He is normal weight. He is not ill-appearing or toxic-appearing.  HENT:     Head: Normocephalic.     Right Ear: Tympanic membrane, ear canal and external ear normal. There is no impacted cerumen.     Left Ear: Ear canal and external ear normal. There is no impacted cerumen.     Nose: Nose normal. No congestion.     Mouth/Throat:     Mouth: Mucous membranes are moist.     Pharynx: No oropharyngeal exudate or posterior oropharyngeal erythema.  Cardiovascular:     Rate and Rhythm: Normal rate and regular rhythm.     Pulses: Normal pulses.     Heart sounds: Normal heart sounds. No murmur.  Pulmonary:     Effort: Pulmonary effort is normal. No respiratory distress.     Breath sounds: No wheezing or rhonchi.  Chest:     Chest wall: No tenderness.  Abdominal:     General: Abdomen is flat. Bowel sounds are normal. There is no distension.     Palpations: There is no mass.  Skin:    Capillary Refill: Capillary refill takes less than 2  seconds.  Neurological:     Mental Status: He is alert and oriented to person, place, and time.      UC Treatments / Results  Labs (all labs ordered are listed, but only abnormal results are displayed) Labs Reviewed  NOVEL CORONAVIRUS, NAA    EKG   Radiology No results found.  Procedures Procedures (including critical care time)  Medications Ordered in UC Medications - No data to display  Initial Impression / Assessment and Plan / UC Course  I have reviewed the triage vital signs and the nursing notes.  Pertinent labs & imaging results that were available during my care of the patient were reviewed by me and considered in my medical decision making (see chart for details).   Patient stable for discharge.  Benign physical exam.  COVID-19 test was ordered.  Advised patient to quarantine until results become available.  Work note given.  To return for symptom worsening.   Final Clinical Impressions(s) / UC Diagnoses   Final diagnoses:  Suspected COVID-19 virus infection     Discharge Instructions     COVID testing ordered.  It will take between 2-7 days for test results.  Someone will contact you regarding abnormal results.    In the meantime: You should remain isolated in your home for 10 days from symptom onset Get plenty of rest and push fluids Use medications daily for symptom relief Use OTC medications like ibuprofen or tylenol as needed fever or pain Call or go to the ED if you have any new or worsening symptoms such as fever,  worsening cough, shortness of breath, chest tightness, chest pain, turning blue, changes in mental status, etc...     ED Prescriptions    None     PDMP not reviewed this encounter.   Emerson Monte, [place] 02/13/19 [phone]

## 2019-02-13 NOTE — ED Triage Notes (Signed)
Wants covid test , does not have symptoms no known  exposure

## 2019-02-13 NOTE — Discharge Instructions (Addendum)

## 2019-02-15 LAB — NOVEL CORONAVIRUS, NAA: SARS-CoV-2, NAA: NOT DETECTED

## 2019-05-26 ENCOUNTER — Other Ambulatory Visit: Payer: Self-pay | Admitting: Cardiovascular Disease

## 2019-06-04 ENCOUNTER — Telehealth: Payer: Self-pay | Admitting: Adult Health

## 2019-06-04 NOTE — Telephone Encounter (Signed)
Pt has called and asked that his upcoming appointment be switched to a mychart vv and to add a note that he is in need of a script for new CPAP supplies.  Please call

## 2019-06-05 ENCOUNTER — Encounter: Payer: Self-pay | Admitting: Adult Health

## 2019-06-14 DIAGNOSIS — G4733 Obstructive sleep apnea (adult) (pediatric): Secondary | ICD-10-CM | POA: Diagnosis not present

## 2019-06-14 DIAGNOSIS — E1169 Type 2 diabetes mellitus with other specified complication: Secondary | ICD-10-CM | POA: Diagnosis not present

## 2019-06-14 DIAGNOSIS — I10 Essential (primary) hypertension: Secondary | ICD-10-CM | POA: Diagnosis not present

## 2019-06-14 DIAGNOSIS — Z7189 Other specified counseling: Secondary | ICD-10-CM | POA: Diagnosis not present

## 2019-07-01 ENCOUNTER — Telehealth: Payer: Self-pay | Admitting: Adult Health

## 2019-07-05 ENCOUNTER — Other Ambulatory Visit: Payer: Self-pay

## 2019-07-05 ENCOUNTER — Ambulatory Visit: Payer: Medicare HMO | Admitting: Adult Health

## 2019-07-05 ENCOUNTER — Encounter: Payer: Self-pay | Admitting: Adult Health

## 2019-07-05 VITALS — BP 138/78 | HR 54 | Temp 97.7°F | Ht 64.0 in | Wt 269.0 lb

## 2019-07-05 DIAGNOSIS — Z9989 Dependence on other enabling machines and devices: Secondary | ICD-10-CM

## 2019-07-05 DIAGNOSIS — G4733 Obstructive sleep apnea (adult) (pediatric): Secondary | ICD-10-CM | POA: Diagnosis not present

## 2019-07-05 NOTE — Patient Instructions (Signed)

## 2019-07-05 NOTE — Progress Notes (Signed)
PATIENT: Jordan [name] DOB: 1945/09/20  REASON FOR VISIT: follow up HISTORY FROM: patient  HISTORY OF PRESENT ILLNESS: Today 07/05/19:  Jordan [name] is a 74 year old male with a history of obstructive sleep apnea on CPAP.  His download indicates that he uses machine nightly for compliance of 100%.  He uses machine greater than 4 hours for compliance of 76.7%.  On average he uses his machine 4 hours and 41 minutes.  His residual AHI is 3.7 on 5 to 12 cm of water with EPR 3.  Reports that the CPAP is working well for him.  States that he is getting better with using it more often.  HISTORY The patient CPAP download shows suboptimal compliance he uses machine every night for compliance of 100%.  He uses machine greater than 4 hours only 33.3%.  On average he uses his machine 3 hours and 29 minutes each night.  He states that sometimes he takes it off because his mouth is dry he also states that he needs new supplies.  He is on a pressure of 5 to 12 cm of water.  He does not have a significant leak.  Reports that he is tolerating the machine well.  REVIEW OF SYSTEMS: Out of a complete 14 system review of symptoms, the patient complains only of the following symptoms, and all other reviewed systems are negative.  FSS 48 ESS 8  ALLERGIES: Allergies  Allergen Reactions  . Codeine   . Statins Other (See Comments)    Muscle aches Muscle aches    HOME MEDICATIONS: Outpatient Medications Prior to Visit  Medication Sig Dispense Refill  . acetaminophen (TYLENOL 8 HOUR) 650 MG CR tablet Take 650 mg by mouth every 8 (eight) hours as needed.      Marland Kitchen Apoaequorin (PREVAGEN PO) Take 20 mg by mouth daily.    Marland Kitchen aspirin 81 MG tablet Take 81 mg by mouth daily.      . carvedilol (COREG) 3.125 MG tablet Take 1 tablet (3.125 mg total) by mouth daily. 90 tablet 2  . Cholecalciferol (VITAMIN D3 PO) Take 2,000 Units by mouth daily.    Marland Kitchen CINNAMON PO Take 1 tablet by mouth 2 (two) times daily. 1000 mg tablets     . clopidogrel (PLAVIX) 75 MG tablet Take 1 tablet (75 mg total) by mouth daily. 90 tablet 3  . FIBER ADULT GUMMIES PO Take 2-3 Doses by mouth daily.    . fluticasone (CUTIVATE) 0.05 % cream Apply 1 application topically daily.     . fluticasone (FLONASE) 50 MCG/ACT nasal spray Place 1 spray into both nostrils 2 (two) times daily.    Marland Kitchen ketoconazole (NIZORAL) 2 % cream Apply 1 application topically daily.    Marland Kitchen lisinopril (ZESTRIL) 5 MG tablet Take 0.5 tablets (2.5 mg total) by mouth daily. 45 tablet 2  . metFORMIN (GLUCOPHAGE-XR) 500 MG 24 hr tablet Take 500 mg by mouth daily with breakfast.      . Misc Natural Products (TART CHERRY ADVANCED PO) Take 433 mg by mouth daily.    . NON FORMULARY Beta Prostate. Take 1 capsule by mouth twice daily.    . NON FORMULARY CPAP    . pantoprazole (PROTONIX) 40 MG tablet Take 1 tablet (40 mg total) by mouth daily. 90 tablet 3  . Psyllium (METAMUCIL PO) Take 1 Dose by mouth daily.    . rosuvastatin (CRESTOR) 5 MG tablet TAKE 1 TABLET (5 MG TOTAL) BY MOUTH EVERY OTHER DAY. 45 tablet 0  .  sildenafil (VIAGRA) 100 MG tablet Take 100 mg by mouth daily as needed.     Marland Kitchen UNABLE TO FIND Take 2 tablets by mouth daily. Pt takes [employer]    . vitamin B-12 (CYANOCOBALAMIN) 500 MCG tablet Take 1,000 mcg by mouth daily.    . vitamin C (ASCORBIC ACID) 500 MG tablet Take 1,000 mg by mouth 2 (two) times daily.      No facility-administered medications prior to visit.    PAST MEDICAL HISTORY: Past Medical History:  Diagnosis Date  . Arthritis   . CAD (coronary artery disease)   . Colon polyps 01/23/2010   Descending  . Diabetes mellitus   . Diverticulosis   . Gastric ulcer   . GI bleed   . Hypertension   . Hypotension    transient  . OSA (obstructive sleep apnea)    Severe, on CPAP therapy    PAST SURGICAL HISTORY: Past Surgical History:  Procedure Laterality Date  . CARDIAC CATHETERIZATION  05/01/2007   Recommend rotational atherectomy followed by PTCA  and probable stenting of LAD.  Marland Kitchen CARDIAC CATHETERIZATION  05/28/2007   Mid LAD hihgh grade 80-90% stenosis, stented with a 3x68m Cypher stent implanted at 16atm, postdilated with a 3.25x241mQuantum at 16atm, which revealed excellent wall appoistion - 5.7951m. CARDIOVASCULAR STRESS TEST  02/05/2011   Perfusion defect seen in inferior myocardial region consistent with diagphragmatic attenuation. Remaining myocardium demonstrates normal myocardial perfusion with no evidence of ischemia or infarct. ECG is positive for ischemia.  . CPAP/BIPAP SLEEP STUDY  07/01/2007   AHI-0.5/hr at 11cm water pressure, RDI-12.6/hr  . LOWER ARTERIAL DOPPLER  06/03/2007   No evidence of dissection, AV fistula, or active pseudoaneurysm.  . SMarland KitchenEEP STUDY  05/13/2007   AHI-11.52/hr, AHI REM-56.0/hr, RDI-11.7/hr, RDI REM-56.0/hr, avg oxygen sat-94%  . TRANSTHORACIC ECHOCARDIOGRAM  03/13/2010   EF >55>37%ormal diastolic function, mild MR, mild TR, and normal RVSP    FAMILY HISTORY: Family History  Problem Relation Age of Onset  . Stroke Brother   . Hypertension Brother   . Alzheimer's disease Maternal Grandmother   . Stroke Maternal Grandfather   . Heart disease Maternal Grandfather   . Stroke Paternal Grandmother   . Heart attack Paternal Grandfather   . Hypertension Father   . Alzheimer's disease Father   . Parkinson's disease Father   . Alzheimer's disease Mother     SOCIAL HISTORY: Social History   Socioeconomic History  . Marital status: Married    Spouse name: Not on file  . Number of children: 2  . Years of education: Not on file  . Highest education level: Not on file  Occupational History  . Occupation: retired  Tobacco Use  . Smoking status: Former Smoker    Quit date: 01/28/1978    Years since quitting: 41.4  . Smokeless tobacco: Never Used  Substance and Sexual Activity  . Alcohol use: Yes    Comment: rarely  . Drug use: No  . Sexual activity: Not on file  Other Topics Concern  . Not on  file  Social History Narrative  . Not on file   Social Determinants of Health   Financial Resource Strain:   . Difficulty of Paying Living Expenses:   Food Insecurity:   . Worried About RunCharity fundraiser the Last Year:   . RanArboriculturist the Last Year:   Transportation Needs:   . LacFilm/video editoredical):   . LMarland Kitchenck of Transportation (  Non-Medical):   Physical Activity:   . Days of Exercise per Week:   . Minutes of Exercise per Session:   Stress:   . Feeling of Stress :   Social Connections:   . Frequency of Communication with Friends and Family:   . Frequency of Social Gatherings with Friends and Family:   . Attends Religious Services:   . Active Member of Clubs or Organizations:   . Attends Archivist Meetings:   Marland Kitchen Marital Status:   Intimate Partner Violence:   . Fear of Current or Ex-Partner:   . Emotionally Abused:   Marland Kitchen Physically Abused:   . Sexually Abused:       PHYSICAL EXAM  Vitals:   07/05/19 1425  BP: 138/78  Pulse: (!) 54  Temp: 97.7 F (36.5 C)  Weight: 269 lb (122 kg)  Height: '5\' 4"'  (1.626 m)   Body mass index is 46.17 kg/m.  Generalized: Well developed, in no acute distress  Chest: Lungs clear to auscultation bilaterally  Neurological examination  Mentation: Alert oriented to time, place, history taking. Follows all commands speech and language fluent Cranial nerve II-XII: Extraocular movements were full, visual field were full on confrontational test Head turning and shoulder shrug  were normal and symmetric. Motor: The motor testing reveals 5 over 5 strength of all 4 extremities. Good symmetric motor tone is noted throughout.  Sensory: Sensory testing is intact to soft touch on all 4 extremities. No evidence of extinction is noted.  Gait and station: Gait is normal.    DIAGNOSTIC DATA (LABS, IMAGING, TESTING) - I reviewed patient records, labs, notes, testing and imaging myself where available.  Lab Results   Component Value Date   WBC 10.3 03/28/2009   HGB 13.3 03/28/2009   HCT 40.9 03/28/2009   MCV 90.6 03/28/2009   PLT 234.0 03/28/2009      Component Value Date/Time   NA 139 10/03/2016 0740   K 4.3 10/03/2016 0740   CL 103 10/03/2016 0740   CO2 26 10/03/2016 0740   GLUCOSE 97 10/03/2016 0740   BUN 12 10/03/2016 0740   CREATININE 0.84 10/03/2016 0740   CALCIUM 9.1 10/03/2016 0740   PROT 6.8 10/03/2016 0740   ALBUMIN 3.8 10/03/2016 0740   AST 15 10/03/2016 0740   ALT 14 10/03/2016 0740   ALKPHOS 67 10/03/2016 0740   BILITOT 0.4 10/03/2016 0740   GFRNONAA >60 01/25/2009 0220   GFRAA  01/25/2009 0220    >60        The eGFR has been calculated using the MDRD equation. This calculation has not been validated in all clinical situations. eGFR's persistently <60 mL/min signify possible Chronic Kidney Disease.   Lab Results  Component Value Date   CHOL 108 10/03/2016   HDL 49 10/03/2016   LDLCALC 49 10/03/2016   TRIG 48 10/03/2016   CHOLHDL 2.2 10/03/2016   No results found for: HGBA1C Lab Results  Component Value Date   UYQIHKVQ25 956 01/23/2009   No results found for: TSH    ASSESSMENT AND PLAN 74 y.o. year old male  has a past medical history of Arthritis, CAD (coronary artery disease), Colon polyps (01/23/2010), Diabetes mellitus, Diverticulosis, Gastric ulcer, GI bleed, Hypertension, Hypotension, and OSA (obstructive sleep apnea). here with:  1. OSA on CPAP  - CPAP compliance has improved - Good treatment of AHI  - Encourage patient to use CPAP nightly and > 4 hours each night - F/U in 1 year or sooner if needed  I spent 20 minutes of face-to-face and non-face-to-face time with patient.  This included previsit chart review, lab review, study review, order entry, electronic health record documentation, patient education.  Jordan Givens, MSN, NP-C 07/05/2019, 2:42 PM [facility] [address] [place], [place] [address] [phone]

## 2019-07-06 NOTE — Progress Notes (Signed)
Message to [facility] for new cpap orders sent. Received.  

## 2019-07-14 ENCOUNTER — Telehealth: Payer: Self-pay | Admitting: Adult Health

## 2019-07-14 NOTE — Telephone Encounter (Signed)
I called pt and relayed that did send message that new order to them but did again prior to calling him.  I gave him # [phone] as well.  He will try as well.

## 2019-07-14 NOTE — Telephone Encounter (Signed)
Pt called stating that he was told to call to let us know if he has not heard anything about his cpap supplies. Pt states it has been a week or so that he's been waiting to hear something about the supplies. Please advise.

## 2019-07-15 DIAGNOSIS — G4733 Obstructive sleep apnea (adult) (pediatric): Secondary | ICD-10-CM | POA: Diagnosis not present

## 2019-07-27 ENCOUNTER — Other Ambulatory Visit: Payer: Self-pay | Admitting: Gastroenterology

## 2019-08-08 ENCOUNTER — Other Ambulatory Visit: Payer: Self-pay | Admitting: Cardiovascular Disease

## 2019-10-05 DIAGNOSIS — I10 Essential (primary) hypertension: Secondary | ICD-10-CM | POA: Diagnosis not present

## 2019-10-05 DIAGNOSIS — Z0000 Encounter for general adult medical examination without abnormal findings: Secondary | ICD-10-CM | POA: Diagnosis not present

## 2019-10-05 DIAGNOSIS — M1712 Unilateral primary osteoarthritis, left knee: Secondary | ICD-10-CM | POA: Diagnosis not present

## 2019-10-05 DIAGNOSIS — E1169 Type 2 diabetes mellitus with other specified complication: Secondary | ICD-10-CM | POA: Diagnosis not present

## 2019-10-05 DIAGNOSIS — L600 Ingrowing nail: Secondary | ICD-10-CM | POA: Diagnosis not present

## 2019-10-11 ENCOUNTER — Other Ambulatory Visit: Payer: Self-pay | Admitting: Cardiovascular Disease

## 2019-10-18 ENCOUNTER — Other Ambulatory Visit: Payer: Self-pay

## 2019-10-18 ENCOUNTER — Ambulatory Visit: Payer: Medicare HMO | Admitting: Podiatry

## 2019-10-18 ENCOUNTER — Encounter: Payer: Self-pay | Admitting: Podiatry

## 2019-10-18 DIAGNOSIS — E1149 Type 2 diabetes mellitus with other diabetic neurological complication: Secondary | ICD-10-CM | POA: Diagnosis not present

## 2019-10-18 DIAGNOSIS — M79674 Pain in right toe(s): Secondary | ICD-10-CM | POA: Diagnosis not present

## 2019-10-18 DIAGNOSIS — B351 Tinea unguium: Secondary | ICD-10-CM | POA: Diagnosis not present

## 2019-10-18 DIAGNOSIS — M2042 Other hammer toe(s) (acquired), left foot: Secondary | ICD-10-CM

## 2019-10-18 DIAGNOSIS — M2041 Other hammer toe(s) (acquired), right foot: Secondary | ICD-10-CM

## 2019-10-18 DIAGNOSIS — M79675 Pain in left toe(s): Secondary | ICD-10-CM | POA: Diagnosis not present

## 2019-10-18 NOTE — Patient Instructions (Signed)
Diabetes Mellitus and Foot Care Foot care is an important part of your health, especially when you have diabetes. Diabetes may cause you to have problems because of poor blood flow (circulation) to your feet and legs, which can cause your skin to:  Become thinner and drier.  Break more easily.  Heal more slowly.  Peel and crack. You may also have nerve damage (neuropathy) in your legs and feet, causing decreased feeling in them. This means that you may not notice minor injuries to your feet that could lead to more serious problems. Noticing and addressing any potential problems early is the best way to prevent future foot problems. How to care for your feet Foot hygiene  Wash your feet daily with warm water and mild soap. Do not use hot water. Then, pat your feet and the areas between your toes until they are completely dry. Do not soak your feet as this can dry your skin.  Trim your toenails straight across. Do not dig under them or around the cuticle. File the edges of your nails with an emery board or nail file.  Apply a moisturizing lotion or petroleum jelly to the skin on your feet and to dry, brittle toenails. Use lotion that does not contain alcohol and is unscented. Do not apply lotion between your toes. Shoes and socks  Wear clean socks or stockings every day. Make sure they are not too tight. Do not wear knee-high stockings since they may decrease blood flow to your legs.  Wear shoes that fit properly and have enough cushioning. Always look in your shoes before you put them on to be sure there are no objects inside.  To break in new shoes, wear them for just a few hours a day. This prevents injuries on your feet. Wounds, scrapes, corns, and calluses  Check your feet daily for blisters, cuts, bruises, sores, and redness. If you cannot see the bottom of your feet, use a mirror or ask someone for help.  Do not cut corns or calluses or try to remove them with medicine.  If you  find a minor scrape, cut, or break in the skin on your feet, keep it and the skin around it clean and dry. You may clean these areas with mild soap and water. Do not clean the area with peroxide, alcohol, or iodine.  If you have a wound, scrape, corn, or callus on your foot, look at it several times a day to make sure it is healing and not infected. Check for: ? Redness, swelling, or pain. ? Fluid or blood. ? Warmth. ? Pus or a bad smell. General instructions  Do not cross your legs. This may decrease blood flow to your feet.  Do not use heating pads or hot water bottles on your feet. They may burn your skin. If you have lost feeling in your feet or legs, you may not know this is happening until it is too late.  Protect your feet from hot and cold by wearing shoes, such as at the beach or on hot pavement.  Schedule a complete foot exam at least once a year (annually) or more often if you have foot problems. If you have foot problems, report any cuts, sores, or bruises to your health care provider immediately. Contact a health care provider if:  You have a medical condition that increases your risk of infection and you have any cuts, sores, or bruises on your feet.  You have an injury that is not   healing.  You have redness on your legs or feet.  You feel burning or tingling in your legs or feet.  You have pain or cramps in your legs and feet.  Your legs or feet are numb.  Your feet always feel cold.  You have pain around a toenail. Get help right away if:  You have a wound, scrape, corn, or callus on your foot and: ? You have pain, swelling, or redness that gets worse. ? You have fluid or blood coming from the wound, scrape, corn, or callus. ? Your wound, scrape, corn, or callus feels warm to the touch. ? You have pus or a bad smell coming from the wound, scrape, corn, or callus. ? You have a fever. ? You have a red line going up your leg. Summary  Check your feet every day  for cuts, sores, red spots, swelling, and blisters.  Moisturize feet and legs daily.  Wear shoes that fit properly and have enough cushioning.  If you have foot problems, report any cuts, sores, or bruises to your health care provider immediately.  Schedule a complete foot exam at least once a year (annually) or more often if you have foot problems. This information is not intended to replace advice given to you by your health care provider. Make sure you discuss any questions you have with your health care provider. Document Revised: 11/04/2018 Document Reviewed: 03/15/2016 Elsevier Patient Education  2020 [employer]  

## 2019-10-20 NOTE — Progress Notes (Signed)
Subjective:   Patient ID: Jordan [name], male   DOB: 74 y.o.   MRN: [id number]   HPI 74 year old male presents the office today for concerns of thick, elongated toenails that he cannot trim himself as well as the big toenails becoming ingrown.  He states that he had this removed 30 to 40 years ago.  Denies any drainage or pus or any swelling, denies toenail sites.  He also has neuropathy reports.  He has no other concerns today.   Review of Systems  All other systems reviewed and are negative.  Past Medical History:  Diagnosis Date  . Arthritis   . CAD (coronary artery disease)   . Colon polyps 01/23/2010   Descending  . Diabetes mellitus   . Diverticulosis   . Gastric ulcer   . GI bleed   . Hypertension   . Hypotension    transient  . OSA (obstructive sleep apnea)    Severe, on CPAP therapy    Past Surgical History:  Procedure Laterality Date  . CARDIAC CATHETERIZATION  05/01/2007   Recommend rotational atherectomy followed by PTCA and probable stenting of LAD.  Marland Kitchen CARDIAC CATHETERIZATION  05/28/2007   Mid LAD hihgh grade 80-90% stenosis, stented with a 3x63mm Cypher stent implanted at 16atm, postdilated with a 3.25x65mm Quantum at 16atm, which revealed excellent wall appoistion - 5.17mm  . CARDIOVASCULAR STRESS TEST  02/05/2011   Perfusion defect seen in inferior myocardial region consistent with diagphragmatic attenuation. Remaining myocardium demonstrates normal myocardial perfusion with no evidence of ischemia or infarct. ECG is positive for ischemia.  . CPAP/BIPAP SLEEP STUDY  07/01/2007   AHI-0.5/hr at 11cm water pressure, RDI-12.6/hr  . LOWER ARTERIAL DOPPLER  06/03/2007   No evidence of dissection, AV fistula, or active pseudoaneurysm.  Marland Kitchen SLEEP STUDY  05/13/2007   AHI-11.52/hr, AHI REM-56.0/hr, RDI-11.7/hr, RDI REM-56.0/hr, avg oxygen sat-94%  . TRANSTHORACIC ECHOCARDIOGRAM  03/13/2010   EF >29%, normal diastolic function, mild MR, mild TR, and normal RVSP     Current  Outpatient Medications:  .  acetaminophen (TYLENOL 8 HOUR) 650 MG CR tablet, Take 650 mg by mouth every 8 (eight) hours as needed.  , Disp: , Rfl:  .  Alcohol Swabs (B-D SINGLE USE SWABS REGULAR) PADS, , Disp: , Rfl:  .  Apoaequorin (PREVAGEN PO), Take 20 mg by mouth daily., Disp: , Rfl:  .  aspirin 81 MG tablet, Take 81 mg by mouth daily.  , Disp: , Rfl:  .  carvedilol (COREG) 3.125 MG tablet, TAKE 1 TABLET EVERY DAY, Disp: 90 tablet, Rfl: 1 .  Cholecalciferol (VITAMIN D3 PO), Take 2,000 Units by mouth daily., Disp: , Rfl:  .  CINNAMON PO, Take 1 tablet by mouth 2 (two) times daily. 1000 mg tablets, Disp: , Rfl:  .  clopidogrel (PLAVIX) 75 MG tablet, Take 1 tablet (75 mg total) by mouth daily., Disp: 90 tablet, Rfl: 3 .  FIBER ADULT GUMMIES PO, Take 2-3 Doses by mouth daily., Disp: , Rfl:  .  fluticasone (CUTIVATE) 0.05 % cream, Apply 1 application topically daily. , Disp: , Rfl:  .  fluticasone (FLONASE) 50 MCG/ACT nasal spray, Place 1 spray into both nostrils 2 (two) times daily., Disp: , Rfl:  .  ketoconazole (NIZORAL) 2 % cream, Apply 1 application topically daily., Disp: , Rfl:  .  lisinopril (ZESTRIL) 5 MG tablet, TAKE 1/2 TABLET EVERY DAY, Disp: 45 tablet, Rfl: 1 .  metFORMIN (GLUCOPHAGE) 500 MG tablet, , Disp: , Rfl:  .  metFORMIN (GLUCOPHAGE-XR) 500 MG 24 hr tablet, Take 500 mg by mouth daily with breakfast.  , Disp: , Rfl:  .  Misc Natural Products (TART CHERRY ADVANCED PO), Take 433 mg by mouth daily., Disp: , Rfl:  .  NON FORMULARY, Beta Prostate. Take 1 capsule by mouth twice daily., Disp: , Rfl:  .  NON FORMULARY, CPAP, Disp: , Rfl:  .  pantoprazole (PROTONIX) 40 MG tablet, TAKE 1 TABLET EVERY DAY, Disp: 90 tablet, Rfl: 1 .  Psyllium (METAMUCIL PO), Take 1 Dose by mouth daily., Disp: , Rfl:  .  rosuvastatin (CRESTOR) 5 MG tablet, TAKE 1 TABLET EVERY OTHER DAY, Disp: 45 tablet, Rfl: 3 .  sildenafil (VIAGRA) 100 MG tablet, Take 100 mg by mouth daily as needed. , Disp: , Rfl:  .   UNABLE TO FIND, Take 2 tablets by mouth daily. Pt takes [employer], Disp: , Rfl:  .  vitamin B-12 (CYANOCOBALAMIN) 500 MCG tablet, Take 1,000 mcg by mouth daily., Disp: , Rfl:  .  vitamin C (ASCORBIC ACID) 500 MG tablet, Take 1,000 mg by mouth 2 (two) times daily. , Disp: , Rfl:   Allergies  Allergen Reactions  . Codeine   . Statins Other (See Comments)    Muscle aches Muscle aches         Objective:  Physical Exam  General: AAO x3, NAD  Dermatological: Nails are hypertrophic, dystrophic, brittle, discolored, elongated 10. No surrounding redness or drainage. Tenderness nails 1-5 bilaterally.  Incurvation present to both medial lateral aspects of bilateral hallux toenails the distal aspect causing discomfort but there is no edema, erythema or clinical signs of infection present.  Mild hyperkeratotic tissue to the left distal toe without any underlying ulceration drainage or signs of infection.  No open lesions or pre-ulcerative lesions are identified today.  Vascular: Dorsalis Pedis artery and Posterior Tibial artery pedal pulses are palpable bilateral with immedate capillary fill time. There is no pain with calf compression, swelling, warmth, erythema.   Neruologic: Grossly intact mostly with symptoms monofilament.  Musculoskeletal: Contractures are present resulting the callus of the distal aspect left second toe.  Muscular strength 5/5 in all groups tested bilateral.  Gait: Unassisted, Nonantalgic.       Assessment:   74 year old male with symptomatic onychomycosis, ingrown toenails     Plan:  -Treatment options discussed including all alternatives, risks, and complications -Etiology of symptoms were discussed -Nails debrided x 10 without any complications or bleeding.  After debridement there was resolution of discomfort to the ingrown nails.  If needed we can perform a partial nail avulsion in the future if needed otherwise recommend routine debridement.  Trula Slade DPM

## 2019-10-26 DIAGNOSIS — E785 Hyperlipidemia, unspecified: Secondary | ICD-10-CM | POA: Diagnosis not present

## 2019-10-26 DIAGNOSIS — E1142 Type 2 diabetes mellitus with diabetic polyneuropathy: Secondary | ICD-10-CM | POA: Diagnosis not present

## 2019-10-26 DIAGNOSIS — M1712 Unilateral primary osteoarthritis, left knee: Secondary | ICD-10-CM | POA: Diagnosis not present

## 2019-10-26 DIAGNOSIS — I10 Essential (primary) hypertension: Secondary | ICD-10-CM | POA: Diagnosis not present

## 2019-11-15 ENCOUNTER — Encounter: Payer: Self-pay | Admitting: Orthopedic Surgery

## 2019-11-15 ENCOUNTER — Ambulatory Visit: Payer: Medicare HMO

## 2019-11-15 ENCOUNTER — Other Ambulatory Visit: Payer: Self-pay

## 2019-11-15 ENCOUNTER — Ambulatory Visit: Payer: Medicare HMO | Admitting: Orthopedic Surgery

## 2019-11-15 VITALS — BP 144/69 | HR 57 | Ht 66.0 in | Wt 275.0 lb

## 2019-11-15 DIAGNOSIS — G8929 Other chronic pain: Secondary | ICD-10-CM

## 2019-11-15 DIAGNOSIS — M25561 Pain in right knee: Secondary | ICD-10-CM

## 2019-11-15 DIAGNOSIS — Z6841 Body Mass Index (BMI) 40.0 and over, adult: Secondary | ICD-10-CM | POA: Diagnosis not present

## 2019-11-15 DIAGNOSIS — M25562 Pain in left knee: Secondary | ICD-10-CM | POA: Diagnosis not present

## 2019-11-15 NOTE — Progress Notes (Addendum)
Chief Complaint  Patient presents with   Knee Pain    bilateral left greater than right    BP (!) 144/69    Pulse (!) 57    Ht 5\' 6"  (1.676 m)    Wt 275 lb (124.7 kg)    BMI 44.39 kg/m    Chief Complaint  Patient presents with   Knee Pain    bilateral left greater than right     Jordan [name] 74 years old he comes in with bilateral knee pain left greater than right.  When he was playing football in 1961 his knee was "knocked out of place" he has had intermittent episodes of locking sensation in the left knee but that resolved over time  Presents now with bilateral knee pain he did go to [facility] did not get relief he is currently on Voltaren every other day complains primarily of stiffness after sitting and difficulty getting up out of a seated position  The notes from [name] indicate that he does have hypertension type 2 diabetes sleep apnea paroxysmal vertigo obesity polyarthritis asymptomatic bradycardia and coronary artery disease he did have a stent placed in 2009 has an ejection fracture of 56 as of 2019  Past Medical History:  Diagnosis Date   Arthritis    CAD (coronary artery disease)    Colon polyps 01/23/2010   Descending   Diabetes mellitus    Diverticulosis    Gastric ulcer    GI bleed    Hypertension    Hypotension    transient   OSA (obstructive sleep apnea)    Severe, on CPAP therapy   Past Surgical History:  Procedure Laterality Date   CARDIAC CATHETERIZATION  05/01/2007   Recommend rotational atherectomy followed by PTCA and probable stenting of LAD.   CARDIAC CATHETERIZATION  05/28/2007   Mid LAD hihgh grade 80-90% stenosis, stented with a 3x64mm Cypher stent implanted at 16atm, postdilated with a 3.25x46mm Quantum at 16atm, which revealed excellent wall appoistion - 5.16mm   CARDIOVASCULAR STRESS TEST  02/05/2011   Perfusion defect seen in inferior myocardial region consistent with diagphragmatic attenuation. Remaining myocardium  demonstrates normal myocardial perfusion with no evidence of ischemia or infarct. ECG is positive for ischemia.   CPAP/BIPAP SLEEP STUDY  07/01/2007   AHI-0.5/hr at 11cm water pressure, RDI-12.6/hr   LOWER ARTERIAL DOPPLER  06/03/2007   No evidence of dissection, AV fistula, or active pseudoaneurysm.   SLEEP STUDY  05/13/2007   AHI-11.52/hr, AHI REM-56.0/hr, RDI-11.7/hr, RDI REM-56.0/hr, avg oxygen sat-94%   TRANSTHORACIC ECHOCARDIOGRAM  03/13/2010   EF >47%, normal diastolic function, mild MR, mild TR, and normal RVSP   The patient meets the AMA guidelines for Morbid (severe) obesity with a BMI > 40.0 and I have recommended weight loss.  He is awake alert and oriented x3 mood and affect are normal he has moderate obesity  He has no support for walking  His stride length is definitely decreased his knee flexion arc is noted to be 115 degrees on the right and 115 degrees on the left slight full extension deficit with good strength normal muscle tone no tremors distally has minimal edema pulse and perfusion otherwise normal sensation is intact  Both knees were x-rayed today in the office.  He has severe arthritis with all 3 compartments involved with joint space narrowing as well as multiple extensive osteophytes in his knee has valgus alignment  We have advised him that he needs to lose about 25 pounds just to get  his BMI under 40  We advised him about proper Covid restrictions on knee replacement at this time although he would like to try nonoperative measures  We told him to lose some weight and we injected both knees Procedure note for bilateral knee injections  Procedure note left knee injection verbal consent was obtained to inject left knee joint  Timeout was completed to confirm the site of injection  The medications used were 40 mg of Depo-Medrol and 1% lidocaine 3 cc  Anesthesia was provided by ethyl chloride and the skin was prepped with alcohol.  After cleaning the skin  with alcohol a 20-gauge needle was used to inject the left knee joint. There were no complications. A sterile bandage was applied.   Procedure note right knee injection verbal consent was obtained to inject right knee joint  Timeout was completed to confirm the site of injection  The medications used were 40 mg of Depo-Medrol and 1% lidocaine 3 cc  Anesthesia was provided by ethyl chloride and the skin was prepped with alcohol.  After cleaning the skin with alcohol a 20-gauge needle was used to inject the right knee joint. There were no complications. A sterile bandage was applied.   Encounter Diagnoses  Name Primary?   Chronic pain of left knee Yes   Chronic pain of right knee    Body mass index 40.0-44.9, adult ([place])    Morbid obesity ([place])

## 2019-11-15 NOTE — Patient Instructions (Signed)

## 2019-12-23 ENCOUNTER — Other Ambulatory Visit: Payer: Self-pay | Admitting: Gastroenterology

## 2020-01-18 ENCOUNTER — Other Ambulatory Visit: Payer: Self-pay

## 2020-01-18 ENCOUNTER — Ambulatory Visit: Payer: Medicare HMO | Admitting: Podiatry

## 2020-01-18 DIAGNOSIS — M79674 Pain in right toe(s): Secondary | ICD-10-CM

## 2020-01-18 DIAGNOSIS — E1149 Type 2 diabetes mellitus with other diabetic neurological complication: Secondary | ICD-10-CM

## 2020-01-18 DIAGNOSIS — B351 Tinea unguium: Secondary | ICD-10-CM

## 2020-01-18 DIAGNOSIS — M79675 Pain in left toe(s): Secondary | ICD-10-CM

## 2020-01-18 DIAGNOSIS — L84 Corns and callosities: Secondary | ICD-10-CM

## 2020-01-18 DIAGNOSIS — Z7901 Long term (current) use of anticoagulants: Secondary | ICD-10-CM

## 2020-01-19 NOTE — Progress Notes (Signed)
Subjective: 74 y.o. returns the office today for painful, elongated, thickened toenails which he cannot trim himself. Denies any redness or drainage around the nails.  He states that his feet felt much better after last appointment.  Denies any acute changes since last appointment and no new complaints today. Denies any systemic complaints such as fevers, chills, nausea, vomiting.   PCP: Iona Beard, MD Last Seen: 10/26/2019  He is on Plavix  Objective: AAO 3, NAD DP/PT pulses palpable, CRT less than 3 seconds Nails hypertrophic, dystrophic, elongated, brittle, discolored 10. There is tenderness overlying the nails 1-5 bilaterally. There is no surrounding erythema or drainage along the nail sites. Hyperkeratotic lesions at the distal aspects of bilateral second toes.  There is no ongoing ulceration drainage or signs of infection noted today. No open lesions or pre-ulcerative lesions are identified. Hammertoe contractures present No pain with calf compression, swelling, warmth, erythema.  Assessment: Patient presents with symptomatic onychomycosis, hyperkeratotic lesions, on Plavix  Plan: -Treatment options including alternatives, risks, complications were discussed -Nails sharply debrided 10 without complication/bleeding. -Hyperkeratotic lesion sharply debrided x2 without any complications or bleeding. -Discussed daily foot inspection. If there are any changes, to call the office immediately.  -Follow-up in 3 months or sooner if any problems are to arise. In the meantime, encouraged to call the office with any questions, concerns, changes symptoms.  Celesta Gentile, DPM

## 2020-01-25 DIAGNOSIS — I10 Essential (primary) hypertension: Secondary | ICD-10-CM | POA: Diagnosis not present

## 2020-01-25 DIAGNOSIS — M1712 Unilateral primary osteoarthritis, left knee: Secondary | ICD-10-CM | POA: Diagnosis not present

## 2020-01-25 DIAGNOSIS — E1142 Type 2 diabetes mellitus with diabetic polyneuropathy: Secondary | ICD-10-CM | POA: Diagnosis not present

## 2020-01-25 DIAGNOSIS — E785 Hyperlipidemia, unspecified: Secondary | ICD-10-CM | POA: Diagnosis not present

## 2020-02-14 DIAGNOSIS — Z0001 Encounter for general adult medical examination with abnormal findings: Secondary | ICD-10-CM | POA: Diagnosis not present

## 2020-02-14 DIAGNOSIS — M130 Polyarthritis, unspecified: Secondary | ICD-10-CM | POA: Diagnosis not present

## 2020-02-14 DIAGNOSIS — G4733 Obstructive sleep apnea (adult) (pediatric): Secondary | ICD-10-CM | POA: Diagnosis not present

## 2020-02-14 DIAGNOSIS — Z0000 Encounter for general adult medical examination without abnormal findings: Secondary | ICD-10-CM | POA: Diagnosis not present

## 2020-02-14 DIAGNOSIS — Z125 Encounter for screening for malignant neoplasm of prostate: Secondary | ICD-10-CM | POA: Diagnosis not present

## 2020-02-15 DIAGNOSIS — G4733 Obstructive sleep apnea (adult) (pediatric): Secondary | ICD-10-CM | POA: Diagnosis not present

## 2020-02-15 DIAGNOSIS — Z0000 Encounter for general adult medical examination without abnormal findings: Secondary | ICD-10-CM | POA: Diagnosis not present

## 2020-02-15 DIAGNOSIS — E1169 Type 2 diabetes mellitus with other specified complication: Secondary | ICD-10-CM | POA: Diagnosis not present

## 2020-02-15 DIAGNOSIS — M130 Polyarthritis, unspecified: Secondary | ICD-10-CM | POA: Diagnosis not present

## 2020-02-15 DIAGNOSIS — Z125 Encounter for screening for malignant neoplasm of prostate: Secondary | ICD-10-CM | POA: Diagnosis not present

## 2020-02-15 DIAGNOSIS — Z0001 Encounter for general adult medical examination with abnormal findings: Secondary | ICD-10-CM | POA: Diagnosis not present

## 2020-02-15 DIAGNOSIS — Z7189 Other specified counseling: Secondary | ICD-10-CM | POA: Diagnosis not present

## 2020-02-15 DIAGNOSIS — E1142 Type 2 diabetes mellitus with diabetic polyneuropathy: Secondary | ICD-10-CM | POA: Diagnosis not present

## 2020-02-15 DIAGNOSIS — I10 Essential (primary) hypertension: Secondary | ICD-10-CM | POA: Diagnosis not present

## 2020-02-16 DIAGNOSIS — H5200 Hypermetropia, unspecified eye: Secondary | ICD-10-CM | POA: Diagnosis not present

## 2020-03-03 DIAGNOSIS — Z0100 Encounter for examination of eyes and vision without abnormal findings: Secondary | ICD-10-CM | POA: Diagnosis not present

## 2020-03-08 ENCOUNTER — Other Ambulatory Visit: Payer: Self-pay | Admitting: Cardiovascular Disease

## 2020-03-25 DIAGNOSIS — E785 Hyperlipidemia, unspecified: Secondary | ICD-10-CM | POA: Diagnosis not present

## 2020-03-25 DIAGNOSIS — M1712 Unilateral primary osteoarthritis, left knee: Secondary | ICD-10-CM | POA: Diagnosis not present

## 2020-03-25 DIAGNOSIS — I10 Essential (primary) hypertension: Secondary | ICD-10-CM | POA: Diagnosis not present

## 2020-03-25 DIAGNOSIS — E1142 Type 2 diabetes mellitus with diabetic polyneuropathy: Secondary | ICD-10-CM | POA: Diagnosis not present

## 2020-03-30 DIAGNOSIS — G4733 Obstructive sleep apnea (adult) (pediatric): Secondary | ICD-10-CM | POA: Diagnosis not present

## 2020-04-10 ENCOUNTER — Other Ambulatory Visit: Payer: Self-pay

## 2020-04-10 MED ORDER — ROSUVASTATIN CALCIUM 5 MG PO TABS
5.0000 mg | ORAL_TABLET | ORAL | 3 refills | Status: DC
Start: 2020-04-10 — End: 2020-04-25

## 2020-04-13 ENCOUNTER — Telehealth: Payer: Self-pay

## 2020-04-13 ENCOUNTER — Other Ambulatory Visit: Payer: Self-pay

## 2020-04-13 ENCOUNTER — Encounter: Payer: Self-pay | Admitting: Urology

## 2020-04-13 ENCOUNTER — Ambulatory Visit (INDEPENDENT_AMBULATORY_CARE_PROVIDER_SITE_OTHER): Payer: Medicare HMO | Admitting: Urology

## 2020-04-13 VITALS — BP 136/83 | HR 67 | Temp 98.4°F | Ht 66.0 in | Wt 275.0 lb

## 2020-04-13 DIAGNOSIS — N403 Nodular prostate with lower urinary tract symptoms: Secondary | ICD-10-CM | POA: Diagnosis not present

## 2020-04-13 DIAGNOSIS — N5201 Erectile dysfunction due to arterial insufficiency: Secondary | ICD-10-CM | POA: Diagnosis not present

## 2020-04-13 DIAGNOSIS — R972 Elevated prostate specific antigen [PSA]: Secondary | ICD-10-CM | POA: Diagnosis not present

## 2020-04-13 DIAGNOSIS — R350 Frequency of micturition: Secondary | ICD-10-CM | POA: Diagnosis not present

## 2020-04-13 LAB — URINALYSIS, ROUTINE W REFLEX MICROSCOPIC
Bilirubin, UA: NEGATIVE
Glucose, UA: NEGATIVE
Ketones, UA: NEGATIVE
Leukocytes,UA: NEGATIVE
Nitrite, UA: NEGATIVE
Protein,UA: NEGATIVE
Specific Gravity, UA: 1.015 (ref 1.005–1.030)
Urobilinogen, Ur: 0.2 mg/dL (ref 0.2–1.0)
pH, UA: 6 (ref 5.0–7.5)

## 2020-04-13 LAB — MICROSCOPIC EXAMINATION
Bacteria, UA: NONE SEEN
Epithelial Cells (non renal): NONE SEEN /hpf (ref 0–10)
Renal Epithel, UA: NONE SEEN /hpf
WBC, UA: NONE SEEN /hpf (ref 0–5)

## 2020-04-13 MED ORDER — LEVOFLOXACIN 750 MG PO TABS: 750.0000 mg | ORAL_TABLET | Freq: Every day | ORAL | 0 refills | Status: DC

## 2020-04-13 NOTE — Progress Notes (Signed)
Subjective: 1. Elevated PSA   2. 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 Erectile dysfunction due to arterial insufficiency      [name] is a 75 yo male who is sent by [name] for an increase in his PSA from 1.7 on 02/02/18 to 3.58 on 02/15/20.  He is voiding well with nocturia 1-2x.  His IPSS is 1. He has a good stream but it sprays a bit.  He has ED but is no longer responding to sildenafil.  He has had no UTI's or GU surgery.  His father had some prostate issues, possibly cancer, but he had other medical issues so he was not felt to be a biopsy candidate.  ROS:  ROS  Allergies  Allergen Reactions  . Codeine   . Statins Other (See Comments)    Muscle aches Muscle aches    Past Medical History:  Diagnosis Date  . Arthritis   . CAD (coronary artery disease)   . Colon polyps 01/23/2010   Descending  . Diabetes mellitus   . Diverticulosis   . Gastric ulcer   . GI bleed   . Hypertension   . Hypotension    transient  . OSA (obstructive sleep apnea)    Severe, on CPAP therapy  . Sleep apnea     Past Surgical History:  Procedure Laterality Date  . CARDIAC CATHETERIZATION  05/01/2007   Recommend rotational atherectomy followed by PTCA and probable stenting of LAD.  Marland Kitchen CARDIAC CATHETERIZATION  05/28/2007   Mid LAD hihgh grade 80-90% stenosis, stented with a 3x41mm Cypher stent implanted at 16atm, postdilated with a 3.25x35mm Quantum at 16atm, which revealed excellent wall appoistion - 5.9mm  . CARDIOVASCULAR STRESS TEST  02/05/2011   Perfusion defect seen in inferior myocardial region consistent with diagphragmatic attenuation. Remaining myocardium demonstrates normal myocardial perfusion with no evidence of ischemia or infarct. ECG is positive for ischemia.  . CPAP/BIPAP SLEEP STUDY  07/01/2007   AHI-0.5/hr at 11cm water pressure, RDI-12.6/hr  . LOWER ARTERIAL DOPPLER  06/03/2007   No evidence of dissection, AV fistula, or active pseudoaneurysm.  Marland Kitchen SLEEP STUDY   05/13/2007   AHI-11.52/hr, AHI REM-56.0/hr, RDI-11.7/hr, RDI REM-56.0/hr, avg oxygen sat-94%  . TRANSTHORACIC ECHOCARDIOGRAM  03/13/2010   EF >50%, normal diastolic function, mild MR, mild TR, and normal RVSP    Social History   Socioeconomic History  . Marital status: Married    Spouse name: Not on file  . Number of children: 2  . Years of education: Not on file  . Highest education level: Not on file  Occupational History  . Occupation: retired  Tobacco Use  . Smoking status: Former Smoker    Quit date: 01/28/1978    Years since quitting: 42.2  . Smokeless tobacco: Never Used  Substance and Sexual Activity  . Alcohol use: Yes    Comment: rarely  . Drug use: No  . Sexual activity: Not on file  Other Topics Concern  . Not on file  Social History Narrative  . Not on file   Social Determinants of Health   Financial Resource Strain: Not on file  Food Insecurity: Not on file  Transportation Needs: Not on file  Physical Activity: Not on file  Stress: Not on file  Social Connections: Not on file  Intimate Partner Violence: Not on file    Family History  Problem Relation Age of Onset  . Stroke Brother   . Hypertension Brother   . Alzheimer's disease Maternal  Grandmother   . Stroke Maternal Grandfather   . Heart disease Maternal Grandfather   . Stroke Paternal Grandmother   . Heart attack Paternal Grandfather   . Hypertension Father   . Alzheimer's disease Father   . Parkinson's disease Father   . Alzheimer's disease Mother     Anti-infectives: Anti-infectives (From admission, onward)   Start     Dose/Rate Route Frequency Ordered Stop   04/13/20 0000  levofloxacin (LEVAQUIN) 750 MG tablet        750 mg Oral Daily 04/13/20 1048        Current Outpatient Medications  Medication Sig Dispense Refill  . acetaminophen (TYLENOL) 650 MG CR tablet Take 650 mg by mouth every 8 (eight) hours as needed.     . Alcohol Swabs (B-D SINGLE USE SWABS REGULAR) PADS     .  Apoaequorin (PREVAGEN PO) Take 20 mg by mouth daily.    Marland Kitchen aspirin 81 MG tablet Take 81 mg by mouth daily.    . carvedilol (COREG) 3.125 MG tablet TAKE 1 TABLET EVERY DAY (NEED MD APPOINTMENT FOR REFILLS) 90 tablet 1  . Cholecalciferol (VITAMIN D3 PO) Take 2,000 Units by mouth daily.    Marland Kitchen CINNAMON PO Take 1 tablet by mouth 2 (two) times daily. 1000 mg tablets    . clopidogrel (PLAVIX) 75 MG tablet Take 1 tablet (75 mg total) by mouth daily. 90 tablet 3  . FIBER ADULT GUMMIES PO Take by mouth.    Marland Kitchen FLUAD QUADRIVALENT 0.5 ML injection     . fluticasone (CUTIVATE) 0.05 % cream Apply 1 application topically daily.    . fluticasone (FLONASE) 50 MCG/ACT nasal spray Place 1 spray into both nostrils 2 (two) times daily.    Marland Kitchen ketoconazole (NIZORAL) 2 % cream Apply 1 application topically daily.    Marland Kitchen levofloxacin (LEVAQUIN) 750 MG tablet Take 1 tablet (750 mg total) by mouth daily. Take 1 hour prior to procedure. 1 tablet 0  . lisinopril (ZESTRIL) 5 MG tablet TAKE 1/2 TABLET EVERY DAY (NEED MD APPOINTMENT FOR REFILLS) 45 tablet 1  . metFORMIN (GLUCOPHAGE) 500 MG tablet     . metFORMIN (GLUCOPHAGE-XR) 500 MG 24 hr tablet Take 500 mg by mouth daily with breakfast.    . Misc Natural Products (TART CHERRY ADVANCED PO) Take 433 mg by mouth daily.    . NON FORMULARY Beta Prostate. Take 1 capsule by mouth twice daily.    . NON FORMULARY CPAP    . pantoprazole (PROTONIX) 40 MG tablet TAKE 1 TABLET EVERY DAY 90 tablet 3  . psyllium (METAMUCIL) 58.6 % packet Take 1 packet by mouth daily.    . rosuvastatin (CRESTOR) 5 MG tablet Take 1 tablet (5 mg total) by mouth every other day. 45 tablet 3  . sildenafil (VIAGRA) 100 MG tablet Take 100 mg by mouth daily as needed.    Marland Kitchen UNABLE TO FIND Take 2 tablets by mouth daily. Pt takes [employer]    . vitamin B-12 (CYANOCOBALAMIN) 500 MCG tablet Take 1,000 mcg by mouth daily.    . vitamin C (ASCORBIC ACID) 500 MG tablet Take 1,000 mg by mouth 2 (two) times daily.      No  current facility-administered medications for this visit.     Objective: Vital signs in last 24 hours: BP 136/83   Pulse 67   Temp 98.4 F (36.9 C)   Ht 5\' 6"  (1.676 m)   Wt 275 lb (124.7 kg)   BMI 44.39 kg/m  Intake/Output from previous day: No intake/output data recorded. Intake/Output this shift: @IOTHISSHIFT @   Physical Exam Vitals reviewed.  Constitutional:      Appearance: Normal appearance. He is obese.  Cardiovascular:     Rate and Rhythm: Normal rate and regular rhythm.  Pulmonary:     Effort: Pulmonary effort is normal.     Breath sounds: Normal breath sounds.  Abdominal:     Palpations: Abdomen is soft.     Hernia: No hernia is present.  Genitourinary:    Comments: Normal penis with adequate meatus. Scrotum, testes and epididymis normal. AP without lesions. NST without mass. Prostate 1.5+ with firm ridge laterally on the right.   SV non-palpable.  Musculoskeletal:        General: No swelling or tenderness. Normal range of motion.     Cervical back: Normal range of motion and neck supple.  Skin:    General: Skin is warm and dry.  Neurological:     General: No focal deficit present.     Mental Status: He is alert and oriented to person, place, and time.  Psychiatric:        Behavior: Behavior normal.     Lab Results:  Labs from [name] reviewed.     UA has 0-2 RBC's.  Results for orders placed or performed in visit on 04/13/20 (from the past 24 hour(s))  Urinalysis, Routine w reflex microscopic     Status: Abnormal   Collection Time: 04/13/20 10:24 AM  Result Value Ref Range   Specific Gravity, UA 1.015 1.005 - 1.030   pH, UA 6.0 5.0 - 7.5   Color, UA Yellow Yellow   Appearance Ur Clear Clear   Leukocytes,UA Negative Negative   Protein,UA Negative Negative/Trace   Glucose, UA Negative Negative   Ketones, UA Negative Negative   RBC, UA Trace (A) Negative   Bilirubin, UA Negative Negative   Urobilinogen, Ur 0.2 0.2 - 1.0 mg/dL    Nitrite, UA Negative Negative   Microscopic Examination See below:    Narrative   Performed at:  [facility] [address] [place], [place]  [id number] Lab Director: Mina Marble MT, Phone:  [phone]  Microscopic Examination     Status: None   Collection Time: 04/13/20 10:24 AM   Urine  Result Value Ref Range   WBC, UA None seen 0 - 5 /hpf   RBC 0-2 0 - 2 /hpf   Epithelial Cells (non renal) None seen 0 - 10 /hpf   Renal Epithel, UA None seen None seen /hpf   Bacteria, UA None seen None seen/Few   Narrative   Performed at:  [facility] [address] [place], [place]  [id number] Lab Director: Harrison, Phone:  [phone]    Studies/Results: No results found.   Assessment/Plan: Prostate nodule and rising PSA.   I will get him set up for a prostate [place] and biopsy.   Risks of bleeding, infection and voiding difficulty reviewed.    I will get cardiac clearance to hold antiplatelet meds.  Levaquin sent.    ED.  I will discuss this with him further once we have evaluated the prostate issue.   Meds ordered this encounter  Medications  . levofloxacin (LEVAQUIN) 750 MG tablet    Sig: Take 1 tablet (750 mg total) by mouth daily. Take 1 hour prior to procedure.    Dispense:  1 tablet    Refill:  0     Orders  Placed This Encounter  Procedures  . Microscopic Examination  . [place] PROSTATE BIOPSY MULTIPLE    Will need to be cleared by Cardiology to come off of plavix and asa.  We will check on that.    Standing Status:   Future    Standing Expiration Date:   05/11/2020    Order Specific Question:   Reason for Exam (SYMPTOM  OR DIAGNOSIS REQUIRED)    Answer:   prostate nodule with rising PSA    Order Specific Question:   Preferred location?    Answer:   [facility]  . Urinalysis, Routine w reflex microscopic     Return for Next available prostate Korea and biopsy.  Will need cardiac clearance to come off of plavix and asa. .    CC:  Dr. Iona Beard and Dr. Shelva Majestic.    Irine Seal 04/13/2020 [phone]

## 2020-04-13 NOTE — Progress Notes (Signed)
Urological Symptom Review  Patient is experiencing the following symptoms: Frequent urination Hard to postpone urination Get up at night to urinate Erection problems (male only)   Review of Systems  Gastrointestinal (upper)  : Negative for upper GI symptoms  Gastrointestinal (lower) : Negative for lower GI symptoms  Constitutional : Negative for symptoms  Skin: Negative for skin symptoms  Eyes: Negative for eye symptoms  Ear/Nose/Throat : Sinus problems  Hematologic/Lymphatic: Negative for Hematologic/Lymphatic symptoms  Cardiovascular : Negative for cardiovascular symptoms  Respiratory : Negative for respiratory symptoms  Endocrine: Negative for endocrine symptoms  Musculoskeletal: Joint pain  Neurological: Negative for neurological symptoms  Psychologic: Negative for psychiatric symptoms

## 2020-04-13 NOTE — Patient Instructions (Signed)
   Appointment Time: 8:30 Arrive 8:15 Appointment Date: 05/11/20  Location: [facility]   Prostate Biopsy Instructions  Stop all aspirin or blood thinners (aspirin, plavix, coumadin, warfarin, motrin, ibuprofen, advil, aleve, naproxen, naprosyn) for 7 days prior to the procedure.  If you have any questions about stopping these medications, please contact your primary care physician or cardiologist.  Having a light meal prior to the procedure is recommended.  If you are diabetic or have low blood sugar please bring a small snack or glucose tablet.  A Fleets enema is needed to be purchased over the counter at a local pharmacy and used 2 hours before you scheduled appointment.  This can be purchased over the counter at any pharmacy.  Antibiotics will be administered in the clinic at the time of the procedure and 1 tablet has been sent to your pharmacy. Please take the antibiotic as prescribed.    Please bring someone with you to the procedure to drive you home if you are given a valium to take prior to your procedure.   If you have any questions or concerns, please feel free to call the office at [phone] or send a Mychart message.    Thank you, [facility]

## 2020-04-13 NOTE — Telephone Encounter (Signed)
Received a fax from [facility] requesting clarification whether pt is taking rosuvastatin 5 mg or simvastatin 40 mg. Paperwork state the received a prescription from Dr. Cathey Endow office on 03/27/20 for simvastatin but [name] prescribed rosuvastatin.   Nurse contacted pt who state he has a prescription for rosuvastatin 5 mg every other day only but stop taking it the Nov. 2021 due to bad muscle cramps and wanted to make MD aware.  Nurse will forward message to MD for review.

## 2020-04-13 NOTE — Telephone Encounter (Signed)
Okay to try simvastatin at 40 mg and if symptoms develop reduced to 20 mg.  Consider adding Zetia 10 mg daily

## 2020-04-14 MED ORDER — SIMVASTATIN 40 MG PO TABS: 40.0000 mg | ORAL_TABLET | Freq: Every day | ORAL | 3 refills | Status: DC

## 2020-04-14 MED ORDER — EZETIMIBE 10 MG PO TABS: 10.0000 mg | ORAL_TABLET | Freq: Every day | ORAL | 3 refills | Status: DC

## 2020-04-14 NOTE — Telephone Encounter (Signed)
Called patient, advised of message below- sent in simvastatin and zetia- patient will try these and let us know of any issues.  Patient verbalized understanding.

## 2020-04-17 ENCOUNTER — Telehealth: Payer: Self-pay | Admitting: Cardiovascular Disease

## 2020-04-17 NOTE — Telephone Encounter (Signed)
Spoke with patient who reports that he is currently taking the Simvastatin and Zetia which were prescribed 2/18 by [name]. Patient states he will continue with these two medications but wanted to let [name] know that he had blood work done in December of 2021 and wanted to let [name] know that he will have his recent lab work faxed over to the office so [name] can review his most recent cholesterol levels. Advised patient I would forward  Message to [name] to make him aware. Patient verbalized understanding.

## 2020-04-17 NOTE — Telephone Encounter (Signed)
Pt c/o medication issue:  1. Name of Medication:   simvastatin (ZOCOR) 40 MG tablet    rosuvastatin (CRESTOR) 5 MG tablet   ezetimibe (ZETIA) 10 MG tablet   2. How are you currently taking this medication (dosage and times per day)? Not taking simvastatin, not taking rosuvastatin, ezetimbe 1 tablet daily   3. Are you having a reaction (difficulty breathing--STAT)? no  4. What is your medication issue? Patient states he stopped taking rosuvastatin about 2 and a half months ago. He states he was also recently given simvastatin and zetia. He states he just started the zetia, but not the simvastatin. He states he had his lab work from 12/22 at his PCP faxed for [name] to review. He states the blood work stated his cholesterol was fine and he is concerned about taking all this medication. Please advise.

## 2020-04-18 ENCOUNTER — Telehealth: Payer: Self-pay | Admitting: *Deleted

## 2020-04-18 NOTE — Telephone Encounter (Signed)
   [facility] Pre-operative Risk Assessment    HEARTCARE STAFF: - Please ensure there is not already an duplicate clearance open for this procedure. - Under Visit Info/Reason for Call, type in Other and utilize the format Clearance MM/DD/YY or Clearance TBD. Do not use dashes or single digits. - If request is for dental extraction, please clarify the # of teeth to be extracted.  Request for surgical clearance:  1. What type of surgery is being performed? PROSTATE BIOPSY    2. When is this surgery scheduled? 05/11/2020    3. What type of clearance is required (medical clearance vs. Pharmacy clearance to hold med vs. Both)? BOTH  4. Are there any medications that need to be held prior to surgery and how long?PLAVIX 7 DAYS    5. Practice name and name of physician performing surgery? DR Jenny Reichmann [facility]    6. What is the office phone number? [phone]   7.   What is the office fax number? [phone]   8.   Anesthesia type (None, local, MAC, general) ? LOCAL

## 2020-04-19 ENCOUNTER — Ambulatory Visit: Payer: Medicare HMO | Admitting: Podiatry

## 2020-04-19 ENCOUNTER — Encounter: Payer: Self-pay | Admitting: Podiatry

## 2020-04-19 ENCOUNTER — Other Ambulatory Visit: Payer: Self-pay

## 2020-04-19 DIAGNOSIS — M2042 Other hammer toe(s) (acquired), left foot: Secondary | ICD-10-CM

## 2020-04-19 DIAGNOSIS — M2041 Other hammer toe(s) (acquired), right foot: Secondary | ICD-10-CM

## 2020-04-19 DIAGNOSIS — M79675 Pain in left toe(s): Secondary | ICD-10-CM

## 2020-04-19 DIAGNOSIS — B351 Tinea unguium: Secondary | ICD-10-CM

## 2020-04-19 DIAGNOSIS — M79674 Pain in right toe(s): Secondary | ICD-10-CM

## 2020-04-19 DIAGNOSIS — L84 Corns and callosities: Secondary | ICD-10-CM | POA: Diagnosis not present

## 2020-04-19 DIAGNOSIS — E1149 Type 2 diabetes mellitus with other diabetic neurological complication: Secondary | ICD-10-CM | POA: Diagnosis not present

## 2020-04-19 NOTE — Telephone Encounter (Signed)
   Primary Cardiologist: No primary care provider on file.  Chart reviewed as part of pre-operative protocol coverage. Because of Jordan [name] past medical history and time since last visit, he will require a follow-up visit in order to better assess preoperative cardiovascular risk.  Pre-op covering staff: - Please schedule appointment-with Dr Lenward Chancellor or APP at [facility].  His surgery is scheduled for March 15th and call patient to inform them. If patient already had an upcoming appointment within acceptable timeframe, please add "pre-op clearance" to the appointment notes so provider is aware. - Please contact requesting surgeon's office via preferred method (i.e, phone, fax) to inform them of need for appointment prior to surgery.  If applicable, this message will also be routed to pharmacy pool and/or primary cardiologist for input on holding anticoagulant/antiplatelet agent as requested below so that this information is available to the clearing provider at time of patient's appointment.   Kerin Ransom, PA-C  04/19/2020, 10:18 AM

## 2020-04-19 NOTE — Patient Instructions (Signed)
Purchase Dr. Gibson Ramp men's house slippers on [employer]

## 2020-04-19 NOTE — Telephone Encounter (Signed)
I s/w the pt and offered a sooner appt with [name] for pre op clearance. Pt is grateful for the sooner appt. I did not cancel the 05/23/20 appt with [name] as I stated to the pt I will leave this up to [name] is he feels pt will need to keep that appt as well. I will send clearance notes to MD for upcoming appt. I will send FYI to requesting the pt has appt 04/25/20.

## 2020-04-22 NOTE — Progress Notes (Signed)
  Subjective:  Patient ID: Jordan [name], male    DOB: [birth year],  MRN: [id number]  75 y.o. male presents with preventative diabetic foot care and painful thick toenails that are difficult to trim. Pain interferes with ambulation. Aggravating factors include wearing enclosed shoe gear. Pain is relieved with periodic professional debridement..    Patient did not check blood glucose this morning. He states he checks his blood glucose twice weekly.  PCP: Iona Beard, MD and last visit was: 10/26/2019.  Review of Systems: Negative except as noted in the HPI.   Allergies  Allergen Reactions  . Codeine   . Statins Other (See Comments)    Muscle aches Muscle aches    Objective:  There were no vitals filed for this visit. Constitutional Patient is a pleasant 75 y.o. African American male in NAD. AAO x 3.  Vascular Capillary fill time to digits <3 seconds b/l lower extremities. Palpable pedal pulses b/l LE. Pedal hair absent. Lower extremity skin temperature gradient within normal limits. No pain with calf compression b/l. No cyanosis or clubbing noted.  Neurologic Normal speech. Protective sensation intact 5/5 intact bilaterally with 10g monofilament b/l.  Dermatologic Pedal skin with normal turgor, texture and tone bilaterally. No open wounds bilaterally. No interdigital macerations bilaterally. Toenails 1-5 b/l elongated, discolored, dystrophic, thickened, crumbly with subungual debris and tenderness to dorsal palpation. Hyperkeratotic lesion(s) L 2nd toe and R 2nd toe.  No erythema, no edema, no drainage, no fluctuance.  Orthopedic: Normal muscle strength 5/5 to all lower extremity muscle groups bilaterally. No pain crepitus or joint limitation noted with ROM b/l. Hammertoes noted to the 2-5 bilaterally.    Assessment:   1. Pain due to onychomycosis of toenails of both feet   2. Corns   3. Hammertoes of both feet   4. Type II diabetes mellitus with neurological manifestations [facility])     Plan:  Patient was evaluated and treated and all questions answered.  Onychomycosis with pain -Nails palliatively debridement as below. -Educated on self-care  Procedure: Nail Debridement Rationale: Pain Type of Debridement: manual, sharp debridement. Instrumentation: Nail nipper, rotary burr. Number of Nails: 10  -Examined patient. -No new findings. No new orders. -Continue diabetic foot care principles. -Instructed patient to purchase Dr. Gibson Ramp men's house slippers on [address] to wear in the house daily. -Patient to continue soft, supportive shoe gear daily.  -Toenails 1-5 b/l were debrided in length and girth with sterile nail nippers and dremel without iatrogenic bleeding.  -Corn(s) L 2nd toe and R 2nd toe pared utilizing sterile scalpel blade without complication or incident. Total number debrided=2. -Patient to report any pedal injuries to medical professional immediately. -Dispensed two gel toe caps for b/l 2nd toes. Apply every morning. Remove every evening. -Patient/POA to call should there be question/concern in the interim.  Return in about 3 months (around 07/17/2020).  Marzetta Board, DPM

## 2020-04-24 DIAGNOSIS — E1142 Type 2 diabetes mellitus with diabetic polyneuropathy: Secondary | ICD-10-CM | POA: Diagnosis not present

## 2020-04-24 DIAGNOSIS — I10 Essential (primary) hypertension: Secondary | ICD-10-CM | POA: Diagnosis not present

## 2020-04-24 DIAGNOSIS — M1712 Unilateral primary osteoarthritis, left knee: Secondary | ICD-10-CM | POA: Diagnosis not present

## 2020-04-24 DIAGNOSIS — E785 Hyperlipidemia, unspecified: Secondary | ICD-10-CM | POA: Diagnosis not present

## 2020-04-25 ENCOUNTER — Encounter: Payer: Self-pay | Admitting: Cardiovascular Disease

## 2020-04-25 ENCOUNTER — Ambulatory Visit: Payer: Medicare HMO | Admitting: Cardiovascular Disease

## 2020-04-25 ENCOUNTER — Other Ambulatory Visit: Payer: Self-pay

## 2020-04-25 DIAGNOSIS — E785 Hyperlipidemia, unspecified: Secondary | ICD-10-CM

## 2020-04-25 DIAGNOSIS — I2510 Atherosclerotic heart disease of native coronary artery without angina pectoris: Secondary | ICD-10-CM | POA: Diagnosis not present

## 2020-04-25 DIAGNOSIS — I2584 Coronary atherosclerosis due to calcified coronary lesion: Secondary | ICD-10-CM | POA: Diagnosis not present

## 2020-04-25 DIAGNOSIS — Z9989 Dependence on other enabling machines and devices: Secondary | ICD-10-CM | POA: Diagnosis not present

## 2020-04-25 DIAGNOSIS — G4733 Obstructive sleep apnea (adult) (pediatric): Secondary | ICD-10-CM | POA: Diagnosis not present

## 2020-04-25 DIAGNOSIS — E1159 Type 2 diabetes mellitus with other circulatory complications: Secondary | ICD-10-CM | POA: Diagnosis not present

## 2020-04-25 NOTE — Patient Instructions (Signed)
Medication Instructions:  The current medical regimen is effective;  continue present plan and medications.  *If you need a refill on your cardiac medications before your next appointment, please call your pharmacy*   Lab Work: CMET, LIPID in 3 months (no lab appointment needed, come fasting- nothing to eat or drink)   If you have labs (blood work) drawn today and your tests are completely normal, you will receive your results only by: Marland Kitchen MyChart Message (if you have MyChart) OR . A paper copy in the mail If you have any lab test that is abnormal or we need to change your treatment, we will call you to review the results.   Follow-Up: At [facility], you and your health needs are our priority.  As part of our continuing mission to provide you with exceptional heart care, we have created designated Provider Care Teams.  These Care Teams include your primary Cardiologist (physician) and Advanced Practice Providers (APPs -  Physician Assistants and Nurse Practitioners) who all work together to provide you with the care you need, when you need it.  We recommend signing up for the patient portal called "MyChart".  Sign up information is provided on this After Visit Summary.  MyChart is used to connect with patients for Virtual Visits (Telemedicine).  Patients are able to view lab/test results, encounter notes, upcoming appointments, etc.  Non-urgent messages can be sent to your provider as well.   To learn more about what you can do with MyChart, go to [email or web address].    Your next appointment:   6 month(s)  The format for your next appointment:   In Person  Provider:   Shelva Majestic, MD   Other Instructions We will send over clearance for procedure.

## 2020-04-25 NOTE — Progress Notes (Signed)
Patient ID: Jordan [name], male   DOB: 11-10-1945, 75 y.o.   MRN: [id number]   Primary MD: Dr. Loleta Chance  HPI: Jordan [name] is a 75 y.o. male who presents to the office today for a 40 month cardiology and pre-operative evaluation.  Jordan [name] has a history of CAD , severe obstructive sleep apnea, morbid obesity, type 2 diabetes mellitus, mild essential hypertension, GERD, as well as erectile dysfunction. In April 2009 he was found to have high-grade focally calcified LAD stenosis and underwent successful high-speed rotational atherectomy and DES stenting with insertion of a 3.033 mm Cypher stent.  He has continued to be on long-term antiplatelet therapy with aspirin and Plavix.  He continues to be active.  He denies any recurrent anginal type symptomatology. His nuclear stress test in 2012 continue to suggest patency of this vessel with normal perfusion.  A two-year followup nuclear perfusion study  on 01/28/2013 demonstrated  normal perfusion without scar or ischemia;  EF was 56%.  He has a history of severe  obstructive sleep apnea originally diagnosed in 2009 and he has been on CPAP therapy since that time with 100% compliance.  He recently received a new CPAP machine after his old machine had begun to malfunction.  He was recently placed on a nasal mask and is followed by Dr.Domeiher.  He has a history of hyperlipidemia and when last seen had begun to have issues with simvastatin causing some myalgias.  I suggest that he change this to Crestor and he is now been on a very low-dose at 5 mg and has been taking this 2 times per week.  He has tolerated this.  Repeat blood work in May 2017 showed a total cholesterol of 153, triglycerides 80, HDL 45, and LDL 92.  An echo Doppler study on 05/11/2015 showed an ejection fraction at 55%; there were no regional wall motion abnormalities but there was  grade 2 diastolic dysfunction.  There was mitral annular calcification without regurgitation.  He normal pulmonic  pressures.  His aortic valve was normal.  In the past.  He has had bilateral knee discomfort and  underwent several injections as well as physical therapy for improvement.  He continues to be followed Dr. [name] , for primary care.  I  saw him in November 2018 at which time he was continuing to do well from a cardiac standpoint and was without chest pain or shortness of breath.   When I last saw him in October 2019 Jordan [name] continued to do well from a cardiac standpoint.  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  He continued to swim at least 5 days/week and 1/2 mile a time doing doggy paddle.  He does admit to some fatigability.  He continued to use CPAP with 100% compliance and uses a nasal mask.  He was on aspirin and Plavix for antiplatelet therapy, lisinopril 2.5 mg for hypertension.  He is diabetic on metformin.  He continued to take rosuvastatin 5 mg for hyperlipidemia and Protonix for GERD.    I last saw him in November 2020.  At that time he was continuing to do well and had  purposeful weight loss losing over 45 pounds.  He continued to use CPAP with 100% compliance by Dr. Vickey Huger for his sleep apnea.  In September 2020 lipid studies were excellent with a total cholesterol 113 HDL 49 LDL 51 and triglycerides 53.  Hemoglobin A1c was stable at 5.7 and he had normal renal function with  a creatinine of 0.84.  Since I last saw him, he has remained stable from a cardiac standpoint.  Specifically he denies any chest pain PND orthopnea or palpitations.  He continues to swim.  He is in need to undergo a prostate biopsy with Dr. Irine Seal to further evaluate a small nodule.  He presents for cardiology evaluation and preoperative clearance.  Past Medical History:  Diagnosis Date  . Arthritis   . CAD (coronary artery disease)   . Colon polyps 01/23/2010   Descending  . Diabetes mellitus   . Diverticulosis   . Gastric ulcer   . GI bleed   . Hypertension   . Hypotension    transient  .  OSA (obstructive sleep apnea)    Severe, on CPAP therapy  . Sleep apnea     Past Surgical History:  Procedure Laterality Date  . CARDIAC CATHETERIZATION  05/01/2007   Recommend rotational atherectomy followed by PTCA and probable stenting of LAD.  Marland Kitchen CARDIAC CATHETERIZATION  05/28/2007   Mid LAD hihgh grade 80-90% stenosis, stented with a 3x58m Cypher stent implanted at 16atm, postdilated with a 3.25x231mQuantum at 16atm, which revealed excellent wall appoistion - 5.7955m. CARDIOVASCULAR STRESS TEST  02/05/2011   Perfusion defect seen in inferior myocardial region consistent with diagphragmatic attenuation. Remaining myocardium demonstrates normal myocardial perfusion with no evidence of ischemia or infarct. ECG is positive for ischemia.  . CPAP/BIPAP SLEEP STUDY  07/01/2007   AHI-0.5/hr at 11cm water pressure, RDI-12.6/hr  . LOWER ARTERIAL DOPPLER  06/03/2007   No evidence of dissection, AV fistula, or active pseudoaneurysm.  . SMarland KitchenEEP STUDY  05/13/2007   AHI-11.52/hr, AHI REM-56.0/hr, RDI-11.7/hr, RDI REM-56.0/hr, avg oxygen sat-94%  . TRANSTHORACIC ECHOCARDIOGRAM  03/13/2010   EF >55>26%ormal diastolic function, mild MR, mild TR, and normal RVSP    Allergies  Allergen Reactions  . Codeine   . Statins Other (See Comments)    Muscle aches Muscle aches    Current Outpatient Medications  Medication Sig Dispense Refill  . acetaminophen (TYLENOL) 650 MG CR tablet Take 650 mg by mouth every 8 (eight) hours as needed.     . AMarland Kitchenoaequorin (PREVAGEN PO) Take 20 mg by mouth daily.    . aMarland Kitchenpirin 81 MG tablet Take 81 mg by mouth daily.    . carvedilol (COREG) 3.125 MG tablet TAKE 1 TABLET EVERY DAY (NEED MD APPOINTMENT FOR REFILLS) 90 tablet 1  . CINNAMON PO Take 1 tablet by mouth 2 (two) times daily. 1000 mg tablets    . ezetimibe (ZETIA) 10 MG tablet Take 1 tablet (10 mg total) by mouth daily. 90 tablet 3  . FIBER ADULT GUMMIES PO Take by mouth.    . FMarland KitchenUAD QUADRIVALENT 0.5 ML injection     .  fluticasone (CUTIVATE) 0.05 % cream Apply 1 application topically daily.    . kMarland Kitchentoconazole (NIZORAL) 2 % cream Apply 1 application topically daily.    . lMarland Kitchenvofloxacin (LEVAQUIN) 750 MG tablet Take 1 tablet (750 mg total) by mouth daily. Take 1 hour prior to procedure. 1 tablet 0  . lisinopril (ZESTRIL) 5 MG tablet TAKE 1/2 TABLET EVERY DAY (NEED MD APPOINTMENT FOR REFILLS) 45 tablet 1  . metFORMIN (GLUCOPHAGE) 500 MG tablet     . NON FORMULARY Beta Prostate. Take 1 capsule by mouth twice daily.    . NON FORMULARY CPAP    . pantoprazole (PROTONIX) 40 MG tablet TAKE 1 TABLET EVERY DAY 90 tablet 3  . psyllium (METAMUCIL) 58.6 %  packet Take 1 packet by mouth daily.    . simvastatin (ZOCOR) 40 MG tablet Take 1 tablet (40 mg total) by mouth at bedtime. 90 tablet 3   No current facility-administered medications for this visit.    Social History   Socioeconomic History  . Marital status: Married    Spouse name: Not on file  . Number of children: 2  . Years of education: Not on file  . Highest education level: Not on file  Occupational History  . Occupation: retired  Tobacco Use  . Smoking status: Former Smoker    Quit date: 01/28/1978    Years since quitting: 42.2  . Smokeless tobacco: Never Used  Substance and Sexual Activity  . Alcohol use: Yes    Comment: rarely  . Drug use: No  . Sexual activity: Not on file  Other Topics Concern  . Not on file  Social History Narrative  . Not on file   Social Determinants of Health   Financial Resource Strain: Not on file  Food Insecurity: Not on file  Transportation Needs: Not on file  Physical Activity: Not on file  Stress: Not on file  Social Connections: Not on file  Intimate Partner Violence: Not on file   Socially he is married and has 2 children. No tobacco or alcohol use.  Family History  Problem Relation Age of Onset  . Stroke Brother   . Hypertension Brother   . Alzheimer's disease Maternal Grandmother   . Stroke Maternal  Grandfather   . Heart disease Maternal Grandfather   . Stroke Paternal Grandmother   . Heart attack Paternal Grandfather   . Hypertension Father   . Alzheimer's disease Father   . Parkinson's disease Father   . Alzheimer's disease Mother     ROS General: Negative; No fevers, chills, or night sweats; mild fatigue HEENT: Negative; No changes in vision or hearing, sinus congestion, difficulty swallowing Pulmonary: Negative; No cough, wheezing, shortness of breath, hemoptysis Cardiovascular: See history of present illness; No chest pain, presyncope, syncope, palpitations GI: GERD, controlled with pantoprazole GU: Positive for erectile dysfunction; No dysuria, hematuria, or difficulty voiding; elevated PSA to undergo biopsy Musculoskeletal: Occasional knee discomfort Hematologic/Oncology: Negative; no easy bruising, bleeding Endocrine: Negative; no heat/cold intolerance; no diabetes Neuro: Negative; no changes in balance, headaches Skin: Negative; No rashes or skin lesions Psychiatric: Negative; No behavioral problems, deprression. Sleep: Positive for obstructive sleep apnea which is severe, currently on CPAP with 100% compliance.  He states oftentimes he takes his mask off when he goes to the bathroom but does not put it back on after going back to see bed Other comprehensive 14 point system review is negative.  PE BP 110/60   Pulse (!) 49   Ht $R'5\' 6"'Se$  (1.676 m)   Wt 278 lb 6.4 oz (126.3 kg)   BMI 44.93 kg/m    Repeat blood pressure by me 116/78  Wt Readings from Last 3 Encounters:  04/25/20 278 lb 6.4 oz (126.3 kg)  04/13/20 275 lb (124.7 kg)  11/15/19 275 lb (124.7 kg)   General: Alert, oriented, no distress.  Skin: normal turgor, no rashes, warm and dry HEENT: Normocephalic, atraumatic. Pupils equal round and reactive to light; sclera anicteric; extraocular muscles intact; Nose without nasal septal hypertrophy Mouth/Parynx benign; Mallinpatti scale 3 Neck: Thick neck; no  JVD, no carotid bruits; normal carotid upstroke Lungs: clear to ausculatation and percussion; no wheezing or rales Chest wall: without tenderness to palpitation Heart: PMI not displaced, RRR, s1  s2 normal, 1/6 systolic murmur, no diastolic murmur, no rubs, gallops, thrills, or heaves Abdomen: soft, nontender; no hepatosplenomehaly, BS+; abdominal aorta nontender and not dilated by palpation. Back: no CVA tenderness Pulses 2+ Musculoskeletal: full range of motion, normal strength, no joint deformities Extremities: no clubbing cyanosis or edema, Homan's sign negative  Neurologic: grossly nonfocal; Cranial nerves grossly wnl Psychologic: Normal mood and affect   ECG (independently read by me): Sinus bradycardia at 49  November 2020 ECG (independently read by me): Sinus Bradycardia ay 56; No ST changes; no ectopy; normal intervals    October 2019 ECG (independently read by me): Sinus bradycardia at 46 bpm.  No ectopy.  Normal intervals.  November 2018 ECG (independently read by me): Sinus bradycardia 43 bpm normal intervals.  No ST segment changes.  May 2018 ECG (independently read by me): Sinus bradycardia 47 bpm.  No ST segment changes.  Normal intervals.  ECG (independently read by me): Sinus bradycardia 52 bpm.  No significant ST changes.  Normal intervals.  June 2017 ECG (independently read by me): Sinus bradycardia 50 bpm.  No ectopy.  February 2017 ECG (independently read by me):  Sinus bradycardia with occasional PVC.  Heart rate 55 bpm.  QTc interval 369 ms.  February 2016 ECG (independently read by me): Sinus bradycardia 50 bpm.  No ectopy.  Normal intervals.   LABS:  BMP Latest Ref Rng & Units 10/03/2016 11/17/2015 01/25/2009  Glucose 65 - 99 mg/dL 97 127(H) 95  BUN 7 - 25 mg/dL _0 Creatinine 0.70 - 1.18 mg/dL 0.84 0.87 0.73  Sodium 135 - 146 mmol/L 139 139 135  Potassium 3.5 - 5.3 mmol/L 4.3 4.2 3.5 DELTA CHECK NOTED REPEATED TO VERIFY  Chloride 98 - 110 mmol/L 103  103 108  CO2 20 - 32 mmol/L _1 Calcium 8.6 - 10.3 mg/dL 9.1 9.0 7.8(L)   Hepatic Function Latest Ref Rng & Units 10/03/2016 11/17/2015 06/30/2015  Total Protein 6.1 - 8.1 g/dL 6.8 7.0 6.9  Albumin 3.6 - 5.1 g/dL 3.8 3.8 3.9  AST 10 - 35 U/L _2 ALT 9 - 46 U/L _3 Alk Phosphatase 40 - 115 U/L 67 65 69  Total Bilirubin 0.2 - 1.2 mg/dL 0.4 0.6 0.5  Bilirubin, Direct <=0.2 mg/dL - - 0.1   CBC Latest Ref Rng & Units 03/28/2009 01/26/2009 01/25/2009  WBC 4.5 - 10.5 10*3/microliter 10.3 10.8(H) 11.8(H)  Hemoglobin 13.0 - 17.0 g/dL 13.3 8.4(L) 8.6(L)  Hematocrit 39.0 - 52.0 % 40.9 24.8(L) 25.3(L)  Platelets 150.0 - 400.0 K/uL 234.0 190 184   Lab Results  Component Value Date   MCV 90.6 03/28/2009   MCV 91.6 01/26/2009   MCV 90.8 01/25/2009   No results found for: TSH   No results found for: HGBA1C  Lipid Panel     Component Value Date/Time   CHOL 108 10/03/2016 0740   TRIG 48 10/03/2016 0740   HDL 49 10/03/2016 0740   CHOLHDL 2.2 10/03/2016 0740   VLDL 10 10/03/2016 0740   LDLCALC 49 10/03/2016 0740     RADIOLOGY: No results found.  IMPRESSION:  1. Coronary artery disease due to calcified coronary lesion; status post rotational atherectomy/DES April 2009 to LAD   2. Hyperlipidemia with target LDL less than 70   3. OSA on CPAP   4. Morbid obesity ([place])   5. Type 2 diabetes mellitus with other circulatory complications ([facility])     ASSESSMENT AND PLAN: Mr.  Aashish Hamm is a 75 year-old African-American male who has a  history of morbid obesity, hypertension, obstructive sleep apnea, and CAD.  He underwent high-speed rotational artherectomy of the severely calcified focal LAD stenosis with insertion of a 3.0x30 mm DES Cypher stent in April 2009.  He has continued to do exceptionally well and denies any recurrent anginal symptomatology.  His last nuclear perfusion study in December 2014  demonstrated normal perfusion without scar or ischemia, ejection fraction was 56%.   He is back swimming on a regular basis.  He has been successful with weight loss over the years.  His blood pressure today is stable on lisinopril 2.5 mg and low-dose carvedilol at 3.125 mg.  He is diabetic on Metformin.  ECG shows sinus bradycardia for which he is asymptomatic.   He had been on aspirin and Plavix but states that he is no longer taking Plavix.  He will need to verify this but if in fact he is taking DAPT aspirin and Plavix will need  held for at least 5 days prior to his prostate biopsy.  If he is off Plavix, he will continue to stay off Plavix therapy.  BMI continues to be consistent with morbid obesity at 44.9.  Weight loss was recommended.  In 3 months I am recommending a follow-up chemistry profile and lipid studies.  He continues to use CPAP with 100% compliance.  As long as he is stable I will see him in 6 months for follow-up evaluation.   Troy Sine, MD, [facility]  05/02/2020 3:36 PM

## 2020-05-02 ENCOUNTER — Telehealth: Payer: Self-pay | Admitting: Cardiovascular Disease

## 2020-05-02 ENCOUNTER — Encounter: Payer: Self-pay | Admitting: Cardiovascular Disease

## 2020-05-02 NOTE — Telephone Encounter (Signed)
Called Pts cardiologist office (Dr. Shelva Majestic). I asked for clearance for this pt to hold blood thinner for 3 to 4 days for a prostate biopsy to be done by Dr. Jeffie Pollock. Explained I had sent a letter to dr through [employer] and sent message to a nurse and saw where note was made by PA at their office of [place] asking for clearance and needing to be discussed at office visit. But I do not see anything about it in drs note.

## 2020-05-02 NOTE — Telephone Encounter (Signed)
Follow Up:      Jordan [name] is calling back for clearance to hold blood thinner for 3 to 4 days, not 7 days please. Please send asap by fax [phone] or Epic.

## 2020-05-02 NOTE — Telephone Encounter (Signed)
   Primary Cardiologist: Shelva Majestic, MD  Chart reviewed as part of pre-operative protocol coverage. Patient was contacted 05/02/2020 in reference to pre-operative risk assessment for pending surgery as outlined below.  Jordan [name] was last seen on 04/25/20 by [name].  Since that day, Jordan [name] has done well. He can complete more than 4.0 METS without angina (swims 3 times weekly). He may hold ASA 3-5 days prior to surgery.  Therefore, based on ACC/AHA guidelines, the patient would be at acceptable risk for the planned procedure without further cardiovascular testing.   The patient was advised that if he develops new symptoms prior to surgery to contact our office to arrange for a follow-up visit, and he verbalized understanding.  I will route this recommendation to the requesting party via Epic fax function and remove from pre-op pool. Please call with questions.  Tami Lin Nephtali Docken, PA 05/02/2020, 12:12 PM

## 2020-05-03 ENCOUNTER — Telehealth: Payer: Self-pay

## 2020-05-03 NOTE — Telephone Encounter (Signed)
Note from Dr. Shelva Majestic office, Cardiologist for this pt was sent today saying pt can hold his aspirin up to 5 days ( March 12th to begin) before scheduled prostate biopsy. Attempted to call pt but unable to reach. Left message on machine.

## 2020-05-05 ENCOUNTER — Telehealth: Payer: Self-pay

## 2020-05-05 NOTE — Telephone Encounter (Signed)
Pt was called and notified clearance was given by Cardiologist to stop aspirin starting on the 12th (Biopsy is the 17th) and restarting after his biopsy.

## 2020-05-09 ENCOUNTER — Other Ambulatory Visit: Payer: Self-pay | Admitting: Urology

## 2020-05-09 DIAGNOSIS — N403 Nodular prostate with lower urinary tract symptoms: Secondary | ICD-10-CM

## 2020-05-11 ENCOUNTER — Other Ambulatory Visit: Payer: Self-pay | Admitting: Urology

## 2020-05-11 ENCOUNTER — Encounter (HOSPITAL_COMMUNITY): Payer: Self-pay

## 2020-05-11 ENCOUNTER — Ambulatory Visit (HOSPITAL_COMMUNITY)
Admission: RE | Admit: 2020-05-11 | Discharge: 2020-05-11 | Disposition: A | Discharge status: Home / Self Care | Payer: Medicare HMO | Source: Ambulatory Visit | Attending: Urology | Admitting: Urology

## 2020-05-11 ENCOUNTER — Ambulatory Visit (INDEPENDENT_AMBULATORY_CARE_PROVIDER_SITE_OTHER): Payer: Medicare HMO | Admitting: Urology

## 2020-05-11 DIAGNOSIS — C801 Malignant (primary) neoplasm, unspecified: Secondary | ICD-10-CM | POA: Insufficient documentation

## 2020-05-11 DIAGNOSIS — N403 Nodular prostate with lower urinary tract symptoms: Secondary | ICD-10-CM

## 2020-05-11 DIAGNOSIS — R972 Elevated prostate specific antigen [PSA]: Secondary | ICD-10-CM

## 2020-05-11 DIAGNOSIS — Z7902 Long term (current) use of antithrombotics/antiplatelets: Secondary | ICD-10-CM | POA: Diagnosis not present

## 2020-05-11 DIAGNOSIS — C61 Malignant neoplasm of prostate: Secondary | ICD-10-CM | POA: Diagnosis not present

## 2020-05-11 DIAGNOSIS — Z7982 Long term (current) use of aspirin: Secondary | ICD-10-CM | POA: Insufficient documentation

## 2020-05-11 MED ORDER — CEFTRIAXONE SODIUM 1 G IJ SOLR: 1.0000 g | Freq: Once | INTRAMUSCULAR | Status: AC

## 2020-05-11 MED ORDER — LIDOCAINE HCL (PF) 1 % IJ SOLN
INTRAMUSCULAR | Status: AC
  Administered 2020-05-11: 2.1 mL
  Filled 2020-05-11: qty 5

## 2020-05-11 MED ORDER — CEFTRIAXONE SODIUM 1 G IJ SOLR
INTRAMUSCULAR | Status: AC
  Administered 2020-05-11: 1 g via INTRAMUSCULAR
  Filled 2020-05-11: qty 10

## 2020-05-11 MED ORDER — LIDOCAINE HCL (PF) 2 % IJ SOLN
INTRAMUSCULAR | Status: AC
  Administered 2020-05-11: 10 mL
  Filled 2020-05-11: qty 10

## 2020-05-11 MED ORDER — LIDOCAINE HCL (PF) 2 % IJ SOLN: 10.0000 mL | Freq: Once | INTRAMUSCULAR | Status: AC

## 2020-05-11 NOTE — Sedation Documentation (Signed)
PT tolerated prostate biopsy procedure and IM antibiotic injection well today. Labs obtained and sent for pathology. PT ambulatory at discharge with no acute distress noted and verbalized understanding of discharge instructions. 

## 2020-05-11 NOTE — Procedures (Signed)
Prostate Biopsy Procedure   Informed consent was obtained after discussing risks/benefits of the procedure.  A time out was performed to ensure correct patient identity.  Pre-Procedure: - Last PSA Level: 3.58 - Prostate exam findings: right lateral firm ridge  - Rocephin 1 gm IM given prophylactically - Levaquin 750 mg administered PO -Transrectal Ultrasound performed using the 10MHz probe.  - Seminal Vesicles: Normal  - Prostate: Symmetrical with hypoechogenicity of the right latera base, mid and apical prostate concerning for malignancy. - Prostate volume:  35.6 gm - Middle lobe: NA - Hypoechoic/Hyperechoic lesions: See above. - Prostate calculi: NA  Procedure: - Prostate block performed using 10 cc 1% lidocaine and needle biopsies taken from sextant areas, a total of 12 under ultrasound guidance.   No additional cores were obtained.   Post-Procedure: - Patient tolerated the procedure well - He was counseled to seek immediate medical attention if experiences any severe pain, significant bleeding, or fevers - He will be called with the biopsy results when available.

## 2020-05-11 NOTE — Discharge Instructions (Signed)

## 2020-05-18 ENCOUNTER — Other Ambulatory Visit: Payer: Self-pay | Admitting: Urology

## 2020-05-18 DIAGNOSIS — C61 Malignant neoplasm of prostate: Secondary | ICD-10-CM

## 2020-05-23 ENCOUNTER — Ambulatory Visit: Payer: Medicare HMO | Admitting: Cardiovascular Disease

## 2020-05-30 ENCOUNTER — Telehealth: Payer: Self-pay

## 2020-05-30 NOTE — Telephone Encounter (Signed)
Pt called and notified of appointment date and time.

## 2020-05-30 NOTE — Telephone Encounter (Signed)
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    I have placed the orders for a bonescan and CT.

## 2020-06-01 ENCOUNTER — Encounter (HOSPITAL_COMMUNITY)
Admission: RE | Admit: 2020-06-01 | Discharge: 2020-06-01 | Disposition: A | Discharge status: Home / Self Care | Payer: Medicare HMO | Source: Ambulatory Visit | Attending: Urology | Admitting: Urology

## 2020-06-01 ENCOUNTER — Encounter (HOSPITAL_COMMUNITY): Payer: Self-pay

## 2020-06-01 ENCOUNTER — Ambulatory Visit (HOSPITAL_COMMUNITY)
Admission: RE | Admit: 2020-06-01 | Discharge: 2020-06-01 | Disposition: A | Discharge status: Home / Self Care | Payer: Medicare HMO | Source: Ambulatory Visit | Attending: Urology | Admitting: Urology

## 2020-06-01 DIAGNOSIS — K760 Fatty (change of) liver, not elsewhere classified: Secondary | ICD-10-CM | POA: Diagnosis not present

## 2020-06-01 DIAGNOSIS — C61 Malignant neoplasm of prostate: Secondary | ICD-10-CM | POA: Diagnosis not present

## 2020-06-01 HISTORY — DX: Malignant (primary) neoplasm, unspecified: C80.1

## 2020-06-01 LAB — POCT I-STAT CREATININE: Creatinine, Ser: 0.8 mg/dL (ref 0.61–1.24)

## 2020-06-01 MED ORDER — TECHNETIUM TC 99M MEDRONATE IV KIT
20.0000 | PACK | Freq: Once | INTRAVENOUS | Status: AC | PRN
  Administered 2020-06-01: 21 via INTRAVENOUS

## 2020-06-01 MED ORDER — IOHEXOL 300 MG/ML  SOLN
100.0000 mL | Freq: Once | INTRAMUSCULAR | Status: AC | PRN
  Administered 2020-06-01: 100 mL via INTRAVENOUS

## 2020-06-08 ENCOUNTER — Ambulatory Visit (INDEPENDENT_AMBULATORY_CARE_PROVIDER_SITE_OTHER): Payer: Medicare HMO | Admitting: Urology

## 2020-06-08 ENCOUNTER — Other Ambulatory Visit: Payer: Self-pay

## 2020-06-08 DIAGNOSIS — C61 Malignant neoplasm of prostate: Secondary | ICD-10-CM | POA: Diagnosis not present

## 2020-06-08 NOTE — Patient Instructions (Signed)
Robot-Assisted Laparoscopic Prostatectomy  Robot-assisted laparoscopic prostatectomy is a minimally invasive procedure to remove the entire prostate, or prostate gland, and the seminal vesicles. The seminal vesicles are near the bladder and the prostate. This procedure is done to treat prostate cancer that has not spread to other parts of the body (has not metastasized). The goals of the procedure are to remove all cancer cells and to prevent prostate cancer from metastasizing. This procedure involves use of a thin, lighted tube with a tiny camera on the end (laparoscope). The laparoscope will allow your surgeon to do the surgery with several small incisions in your abdomen instead of a large incision. Your surgeon will also use robotic arms during the procedure that will be controlled through a computer. During your procedure, the lymph nodes in your pelvis may be removed. Lymph nodes, also called lymph glands, are part of your body's disease-fighting system (immune system). The lymph nodes in the pelvis may be the first place that is affected by the spreading of prostate cancer. If your pelvic lymph nodes are removed, tissue from the nodes will be tested for cancer cells. Tell a health care provider about:  Any allergies you have.  All medicines you are taking, including vitamins, herbs, eye drops, creams, and over-the-counter medicines.  Any problems you or family members have had with anesthetic medicines.  Any blood disorders you have.  Any surgeries you have had.  Any medical conditions you have.  Any prostate infections you have had. What are the risks? Generally, this is a safe procedure. However, problems may occur, including:  Infection.  Bleeding.  Allergic reactions to medicines.  Problems that affect urination or sexual function. These may include: ? Inability to control when you urinate (incontinence). This is usually temporary. ? Narrowing or scarring (stricture) of the  urethra, which is the part that drains urine from your bladder. Stricture may block the flow of urine. ? Inability to get or keep an erection (erectile dysfunction). ? Dry ejaculation. This is when no semen is released during orgasm.  Blockage (obstruction) of the large intestine or small intestine.  Blood clots in the legs.  The formation of a lymphocele. This is a sac (cyst) in the pelvis that is filled with fluid from the lymph nodes.  Damage to nearby structures or organs. What happens before the procedure? Staying hydrated Follow instructions from your health care provider about hydration, which may include:  Up to 2 hours before the procedure - you may continue to drink clear liquids, such as water, clear fruit juice, black coffee, and plain tea.   Eating and drinking restrictions Follow instructions from your health care provider about eating and drinking, which may include:  8 hours before the procedure - stop eating heavy meals or foods, such as meat, fried foods, or fatty foods.  6 hours before the procedure - stop eating light meals or foods, such as toast or cereal.  6 hours before the procedure - stop drinking milk or drinks that contain milk.  2 hours before the procedure - stop drinking clear liquids. Medicines Ask your health care provider about:  Changing or stopping your regular medicines. This is especially important if you are taking diabetes medicines or blood thinners.  Taking medicines such as aspirin and ibuprofen. These medicines can thin your blood. Do not take these medicines unless your health care provider tells you to take them.  Taking over-the-counter medicines, vitamins, herbs, and supplements. General instructions  Do not use any products  that contain nicotine or tobacco for at least 4 weeks before the procedure. These products include cigarettes, e-cigarettes, and chewing tobacco. If you need help quitting, ask your health care provider.  Plan  to have someone take you home from the hospital or clinic.  If you will be going home right after the procedure, plan to have someone with you for 24 hours.  You may have an exam or testing. This may include a CT scan or an MRI.  You may have a blood or urine sample taken.  Ask your health care provider: ? How your surgery site will be marked. ? What steps will be taken to help prevent infection. These may include:  Removing hair at the surgery site.  Washing skin with a germ-killing soap.  Taking antibiotic medicine. What happens during the procedure?  An IV will be inserted into one of your veins.  You will be given one or more of the following: ? A medicine to help you relax (sedative). ? A medicine to make you fall asleep (general anesthetic).  A thin, flexible tube (catheter) will be inserted through your urethra and into your bladder. The catheter will drain urine from your bladder during the procedure and while you heal.  Four or five small incisions will be made in your abdomen.  The laparoscope and other surgical instruments will be put through the incisions. Your surgeon will use the laparoscope and a robotic arm to help control the surgical instruments.  Your urethra will be cut and separated from your bladder, and your prostate and seminal vesicles will be removed.  Your pelvic lymph nodes may also be removed for testing.  Your urethra will be reconnected to your bladder. This will be done so your catheter is still in place inside of your urethra.  A small tube (drain) may be placed in one or more of your incisions to help drain extra fluid from your surgical site.  Your incisions will be closed with stitches (sutures), skin glue, or adhesive strips.  Medicine may be applied to your incisions.  Bandages (dressings) will be placed over your incisions. The procedure may vary among health care providers and hospitals. What happens after the procedure?  Your  blood pressure, heart rate, breathing rate, and blood oxygen level will be monitored until you leave the hospital or clinic.  You may continue to receive fluids and medicines through an IV.  You will have some pain. You may receive medicines for pain.  Your catheter will remain in place to drain urine from your bladder.  You may have fluid coming from a drain in any incision.  You may have to wear compression stockings. These stockings help to prevent blood clots and reduce swelling in your legs.  You will be encouraged to move around as much as you can.  If you were given a sedative during the procedure, it can affect you for several hours. Do not drive or operate machinery until your health care provider says that it is safe. Summary  Robot-assisted laparoscopic prostatectomy is a procedure to remove the prostate and other tissue.  Follow instructions from your health care provider about eating and drinking before your surgery.  You will have a catheter in your bladder after your surgery to drain urine. This information is not intended to replace advice given to you by your health care provider. Make sure you discuss any questions you have with your health care provider. Document Revised: 12/17/2018 Document Reviewed: 12/17/2018 Elsevier Patient  Education  [place]: An international perspective (pp. 71-144). [place]: [employer].">  Brachytherapy for Prostate Cancer Brachytherapy for prostate cancer is a type of radiation treatment that involves placing a source of radiation inside the prostate gland. There are several types of brachytherapy:  Low-dose rate (LDR) therapy. This involves temporary or permanent implants of radioactive seeds or pellets that give off a low dose of radiation. ? Temporary low-dose implants are left in the prostate for 1-7 days. The radioactive material is contained within a delivery tool, which may be a needle, a small, thin tube  (catheter), or another type of applicator. You will need to stay in the hospital while the delivery tool and radioactive material are in place. ? Permanent low-dose implants are injected into the prostate. These give off radiation for up to 1 year after they are inserted. After the radiation is gone, they remain harmlessly in the body and are not removed.  High-dose rate (HDR) therapy. This involves inserting a material that gives off a higher dose of radiation for only a few minutes. The radioactive material is often wires or ribbons contained within a delivery tool (needle, applicator, or catheter). The delivery tool is removed after treatment, and no radioactive material is left in the prostate. In brachytherapy, the radiation does not travel far from the prostate, so healthy tissues around the prostate receive only a small dose of radiation. This helps to protect those tissues from injury. In some cases, brachytherapy may be followed by external beam radiation. Tell a health care provider about:  Any allergies you have.  All medicines you are taking, including vitamins, herbs, eye drops, creams, and over-the-counter medicines.  Any problems you or family members have had with anesthetic medicines.  Any surgeries you have had.  Any blood disorders you have.  Any medical conditions you have. What are the risks? Generally, this is a safe procedure. However, problems may occur, including:  Inflammation of the rectum.  Problems with getting or keeping an erection (erectile dysfunction).  Trouble urinating.  Damage to nearby structures or organs.  Diarrhea.  Bleeding.  Inability to control when you urinate or have bowel movements (incontinence). What happens before the procedure? Staying hydrated Follow instructions from your health care provider about hydration, which may include:  Up to 2 hours before the procedure - you may continue to drink clear liquids, such as water, clear  fruit juice, black coffee, and plain tea.   Eating and drinking restrictions Follow instructions from your health care provider about eating and drinking, which may include:  8 hours before the procedure - stop eating heavy meals or foods, such as meat, fried foods, or fatty foods.  6 hours before the procedure - stop eating light meals or foods, such as toast or cereal.  6 hours before the procedure - stop drinking milk or drinks that contain milk.  2 hours before the procedure - stop drinking clear liquids. Medicines Ask your health care provider about:  Changing or stopping your regular medicines. This is especially important if you are taking diabetes medicines or blood thinners.  Taking medicines such as aspirin and ibuprofen. These medicines can thin your blood. Do not take these medicines unless your health care provider tells you to take them.  Taking over-the-counter medicines, vitamins, herbs, and supplements. Tests You may have an exam or testing. This may include:  Imaging tests, such as an ultrasound, a CT scan, or an MRI.  Blood or urine tests.  A test to check the electrical signals in your heart (electrocardiogram). General instructions  Do not use any products that contain nicotine or tobacco for at least 4 weeks before the procedure. These products include cigarettes, e-cigarettes, and chewing tobacco. If you need help quitting, ask your health care provider.  If you will be going home right after the procedure: ? Plan to have someone take you home from the hospital or clinic. ? Plan to have a responsible adult care for you for at least 24 hours after you leave the hospital or clinic. This is important.  You may need to take medicine to clean out your bowel (bowel prep).  Ask your health care provider: ? How your surgery site will be marked. ? What steps will be taken to help prevent infection. These may include:  Removing hair at the surgery  site.  Washing skin with a germ-killing soap.  Taking antibiotic medicine. What happens during the procedure?  An IV will be inserted into one of your veins.  You will be given one or more of the following: ? A medicine to help you relax (sedative). ? A medicine to numb the area (local anesthetic). ? A medicine to make you fall asleep (general anesthetic).  You may have a catheter inserted to drain your bladder.  Your surgeon will insert the radioactive material. The method used will vary depending on whether you are receiving temporary or permanent brachytherapy. Temporary low-dose or high-dose brachytherapy  A delivery tool (needle, applicator, or catheter) will be inserted into the prostate. It will be inserted through a body cavity, such as the rectum, or through the perineum, which is the area beneath the scrotum.  An X-ray, ultrasound, MRI, or CT scan will be used to guide the delivery tool toward the prostate.  Radioactive seeds, pellets, wires, or ribbons will be fed through the delivery tool.  If the high-dose method is used: ? The radioactive material will be left in for a few minutes and then removed. ? When the treatment is finished, the delivery tool will be removed.  If the low-dose method is used: ? The delivery tool containing the radioactive material will stay in place for 1-7 days. ? You will remain in the hospital while the implant is in place. ? When the treatment is finished, the radioactive material and delivery tool will be removed. Permanent low-dose brachytherapy  A tube or needle will be used to inject small, radioactive seeds or pellets into your prostate.  The needle or tube will be removed, leaving the seeds or pellets in the prostate. The procedure may vary among health care providers and hospitals. What happens after the procedure?  Your blood pressure, heart rate, breathing rate, and blood oxygen level will be monitored until you leave the  hospital or clinic.  If you were given a sedative during the procedure, it can affect you for several hours. Do not drive or operate machinery until your health care provider says that it is safe. Summary  Brachytherapy for prostate cancer is a type of radiation treatment that involves placing a source of radiation inside the prostate gland.  There are several types of brachytherapy for prostate cancer: low-dose temporary treatment, low-dose permanent treatment, and high-dose temporary treatment.  The amount of time that the source of radiation is left in your prostate will depend on the type of brachytherapy you are having. This information is not intended to replace advice given to you by your health care provider. Make sure  you discuss any questions you have with your health care provider. Document Revised: 02/22/2019 Document Reviewed: 12/14/2018 Elsevier Patient Education  2021 [place]. Hydrogel Spacer Implantation for Prostate Cancer Hydrogel spacer implantation is a procedure to place a small gel spacer between your rectum and your prostate gland. You may have this procedure before starting radiation treatment for prostate cancer. The gel is inserted to create more space between your prostate, your rectum, and the base of your penis. This added space will protect your rectum from radiation damage during your prostate cancer treatment. The procedure may:  Reduce the radiation that reaches your rectum.  Reduce the amount of pain that you have from radiation treatment.  Lower your risk for problems with bowel movements, passing urine, or maintaining an erection. The hydrogel spacer lasts for 3 months. This is usually enough time to complete your radiation treatment. After that, the gel will break down and be absorbed by your body. It does not need to be removed. Tell your health care provider about:  Any allergies you have.  All medicines you are taking, including vitamins, herbs,  eye drops, creams, and over-the-counter medicines.  Any problems you or family members have had with anesthetic medicines.  Any blood disorders you have.  Any surgeries you have had.  Any medical conditions you have. What are the risks? Generally, this is a safe procedure. However, problems may occur, including:  Infection.  Bleeding.  Allergic reactions to medicines.  Damage to nearby structures or organs. What happens before the procedure? Medicines  Ask your health care provider about: ? Changing or stopping your regular medicines. This is especially important if you are taking diabetes medicines or blood thinners. ? Taking medicines such as aspirin and ibuprofen. These medicines can thin your blood. Do not take these medicines unless your health care provider tells you to take them. ? Taking over-the-counter medicines, vitamins, herbs, and supplements.  You will be given instructions for taking medicines the day before your procedure to empty your bowel and rectum (bowel prep). Follow these instructions carefully. If the rectum has not been emptied enough, the procedure may have to be canceled and rescheduled. Staying hydrated Follow instructions from your health care provider about hydration, which may include:  Up to 2 hours before the procedure - you may continue to drink clear liquids, such as water, clear fruit juice, black coffee, and plain tea.   Eating and drinking restrictions Follow instructions from your health care provider about eating and drinking, which may include:  8 hours before the procedure - stop eating heavy meals or foods, such as meat, fried foods, or fatty foods.  6 hours before the procedure - stop eating light meals or foods, such as toast or cereal.  6 hours before the procedure - stop drinking milk or drinks that contain milk.  2 hours before the procedure - stop drinking clear liquids. General instructions  Ask your health care provider  what steps will be taken to help prevent infection. These may include: ? Removing hair at the surgery site. ? Washing skin with a germ-killing soap. ? Taking antibiotic medicine.  You may be asked to shower with a germ-killing soap.  Plan to have someone take you home from the hospital or clinic. What happens during the procedure?  An IV will be inserted into one of your veins.  You will be given one or more of the following: ? A medicine to help you relax (sedative). ? A medicine to numb  the area (local anesthetic). This will be injected into the area between your rectum and your scrotum (perineal area). ? A medicine to make you fall asleep (general anesthetic).  An ultrasound probe will be placed into your rectum. This probe will produce sound wave images of the space between your rectum and prostate.  Using the ultrasound images, your surgeon will insert a needle into the space between your rectum and prostate.  The hydrogel spacing liquid will be injected into this space. This liquid will turn into a gel that expands to form a soft protective barrier.  The needle will be removed. The procedure may vary among health care providers and hospitals.   What happens after the procedure?  Your blood pressure, heart rate, breathing rate, and blood oxygen level will be monitored until you leave the hospital or clinic.  Do not drive for 24 hours if you were given a sedative during the procedure. Summary  Hydrogel spacer implantation is a procedure to place a small gel spacer between your rectum and your prostate gland.  You may have this procedure before starting radiation treatment for prostate cancer. Having a hydrogel spacer in place will help prevent or reduce damage to your rectum during the radiation treatment.  The liquid that will form the gel spacer is inserted through a needle while you are under local or general anesthesia.  The gel will break down and be absorbed by your  body after 3 months. It does not need to be removed. This information is not intended to replace advice given to you by your health care provider. Make sure you discuss any questions you have with your health care provider. Document Revised: 08/15/2017 Document Reviewed: 08/15/2017 Elsevier Patient Education  2021 [place]. External Beam Radiation Therapy External beam radiation therapy is a type of radiation treatment. This type of radiation therapy can deliver radiation to a fairly large area. This is the most common type of radiation therapy for cancer. It may be done for several purposes:  Treating cancer. This is done by: ? Destroying cancer cells. Radiation delivered during the treatment damages cancer cells. It may also damage normal cells, but normal cells have the DNA to repair themselves and cancer cells do not. ? Helping with symptoms of a person's cancer. ? Stopping the growth of any remaining cancer cells after surgery. ? Preventing cancer cells from growing in areas that do not have cancer (prophylactic radiation therapy).  Treating or shrinking a tumor that is not cancerous (is benign).  Reducing pain (palliative therapy). The amount of radiation a person receives and the length of therapy depend on the person's medical condition. The person should not feel the radiation being delivered or any pain during the therapy. Let your health care provider know about:  Any allergies you have.  All medicines you are taking, including vitamins, herbs, eye drops, creams, and over-the-counter medicines.  Any blood disorders you have.  Any surgeries you have had.  Any medical conditions you have.  Whether you are pregnant or may be pregnant. What are the risks? Generally, this is a safe procedure. However, radiation therapy can place a person at a higher risk for a second cancer later in life. Most people will have side effects from the therapy. Side effects depend on the amount  of radiation and the part of the body that was exposed to radiation. The most common side effects include:  Skin changes.  Hair loss.  Tiredness (fatigue).  Nausea and vomiting. What  happens before the procedure? Medicines  Ask your health care provider about: ? Changing or stopping your regular medicines. ? Taking over-the-counter medicines, vitamins, herbs, and supplements. General instructions  You will have a planning session (simulation). During the session: ? Your health care provider will plan exactly where the radiation will be delivered. This area is called the treatment field. ? You will be positioned for your therapy. The goal is to have a position that can be reproduced for each therapy session. ? Temporary marks may be drawn on your body. Permanent marks may also be drawn on your body in order for you to be positioned the same way for each therapy session. ? A tool that holds a body part in place (immobilization device) may be used to keep the area of treatment in the correct position.  Follow instructions from your health care provider about eating or drinking restrictions.  You may want to plan to have someone take you home from the hospital or clinic. What happens during the procedure?  You will either lie on a table or sit in a chair in the position determined for your therapy.  You may have a heavy shield placed on you to protect tissues and organs that are not being treated.  The radiation machine (linear accelerator) will move around you to deliver the radiation in exact doses from different angles. The machine will not touch you.  If you had a heavy shield placed on you, it will be removed when the machine is finished delivering radiation. The procedure may vary among health care providers and hospitals.   What happens after the procedure?  You may return to your normal routine--including diet, activities, and medicines--as told by your health care  provider.  You may feel very tired. Summary  External beam radiation therapy is the most common type of radiation treatment for cancer.  The amount of radiation a person will receive and the length of therapy depend on the person's medical condition.  Most people have side effects from the therapy. Side effects depend on the amount of radiation and the part of the body that was exposed to radiation. This information is not intended to replace advice given to you by your health care provider. Make sure you discuss any questions you have with your health care provider. Document Revised: 10/25/2019 Document Reviewed: 10/26/2018 Elsevier Patient Education  [place] Therapy for Prostate Cancer  Hormone suppression therapy is a treatment that can help to slow the growth of cancer cells in the prostate gland. It is also called androgen deprivation therapy (ADT) or androgen suppression therapy. Hormone suppression therapy targets hormones in the body that help cancer cells grow--these hormones are called androgens. Hormone suppression therapy alone will not cure prostate cancer, but it can slow the growth of cancer cells and may shrink tumors over time. Your health care provider can help you find the best treatment that fits your lifestyle. Hormone suppression therapy may be used in the following cases:  When prostate cancer has spread too far to other places in the body and cannot be cured by surgery or radiation.  When a person has health problems that prevent them from having surgery or radiation.  Before radiation to help shrink the size of the cancer and make the radiation treatment more effective.  If the prostate cancer remains or comes back following treatment with surgery or radiation. What are the types of hormone suppression therapy? Orchiectomy Orchiectomy, also called surgical castration,  is a surgery to remove one or both testicles. The testicles make  the two main androgens--testosterone and dihydrotestosterone. This surgery reduces the levels of testosterone in the blood, leading to decreased androgen production. Medicine therapy Medicine therapy, also called medical or chemical castration, involves taking medicines to keep your body from making or using androgens. Medicines can do this in one of three ways: 1. Reducing androgen production by the testicles: ? Luteinizing hormone-releasing hormone [facility]) agonist medicines. These medicines are injected or implanted under your skin to lower the amount of androgens that your testicles make. If you take these medicines, you may also be prescribed other medicines to help with side effects. ? LHRH antagonist medicines. These medicines also work to lower the amount of androgens made in the testicles but they work faster than [facility] agonist medicines and have less severe side effects. They are given as a monthly injection under the skin, and they are used when prostate cancer is in an advanced stage. ? Estrogens (male hormones). These medicines help reduce androgen production by the testicles. Usually, other types of hormone suppression therapy are used first based. If they do not work well or if they stop working, then estrogen may be given. 2. Blocking androgen attachment throughout the body. ? Anti-androgen medicines, also called androgen receptor antagonists, block areas on the body where androgens attach. These are pills that are usually used in combination with other types of hormone suppression therapy, like orchiectomy and other medicines. 3. Blocking androgen production throughout the body. ? Androgen synthesis inhibitor medicines. These medicines help to stop other areas of the body from making androgens. They are taken as pills. They may be used if the prostate cancer is advanced and has not gotten better with surgery or other medicines. A steroid medicine may be given with this type of medicine to  help with side effects. What are the risks? Hormone suppression therapy may cause side effects, including:  Hot flashes.  Sexual side effects: ? Decrease or lack of sexual desire. ? Decrease in size of penis or testicles. ? Inability to get an erection (erectile dysfunction, or impotence). ? Breast tenderness or increase in breast size.  Fatigue.  Weight gain.  Thinning of the bones (osteoporosis) or loss of muscle mass.  Depression.  Increased cholesterol levels.  Trouble with thinking or focusing. Hormone suppression therapy may also increase your risk of high blood pressure (hypertension), stroke, heart attack, or diabetes. What are the benefits? One of the main benefits of hormone suppression therapy is having additional treatment options. You may have only one type of treatment or two or more types at the same time. Treatments may be combined to:  Help with side effects.  Treat advanced cancer. Contact a health care provider if:  You have pain or side effects that do not get better with treatment.  You have trouble urinating.  You have new side effects that do not go away. Get help right away if:  You have severe chest pain.  You have trouble breathing.  You have an irregular heartbeat.  You have numbness or paralysis in the lower half of your body.  You are confused.  You have trouble talking or understanding speech. These symptoms may be an emergency. Do not wait to see if the symptoms will go away. Get medical help right away. Call your local emergency services (911 in the [place] Do not drive yourself to the hospital. Summary  Hormone suppression therapy is a treatment that can help to  slow the growth of cancer cells in the prostate (prostate gland).  Hormone suppression therapy alone will not cure prostate cancer, but it can slow the growth of prostate cancer and may shrink tumors over time.  Treatment to suppress hormones may include surgery or  medicines. This information is not intended to replace advice given to you by your health care provider. Make sure you discuss any questions you have with your health care provider. Document Revised: 01/07/2019 Document Reviewed: 01/07/2019 Elsevier Patient Education  2021 [employer].

## 2020-06-08 NOTE — Progress Notes (Signed)
Subjective: 1. Prostate cancer [facility])      [name] returns today in f/u to discuss the results of his prostate biopsy and staging studies.  He was found to have a 35.49ml prostate with 6/6 right cores positive for cancer.  5/6 were GG4 with up to 90% involvement and 1/6 was GG3.  He has high risk disease and has undergone staging with bone scan and CT with both being negative.  He has stage T2b N0 M0 cancer.  He has DM, CAD and HTN as well as morbid obesity but swims regularly and is compliant with his meds.  He should have a reasonable 5 year life expectancy.   Hx: He  is a 75 yo male who is sent by [name] for an increase in his PSA from 1.7 on 02/02/18 to 3.58 on 02/15/20.  He is voiding well with nocturia 1-2x.  His IPSS is 1. He has a good stream but it sprays a bit.  He has ED but is no longer responding to sildenafil.  He has had no UTI's or GU surgery.  His father had some prostate issues, possibly cancer, but he had other medical issues so he was not felt to be a biopsy candidate.  ROS:  ROS  Allergies  Allergen Reactions  . Codeine   . Statins Other (See Comments)    Muscle aches Muscle aches    Past Medical History:  Diagnosis Date  . Arthritis   . CAD (coronary artery disease)   . Cancer [facility])    Prostate  . Colon polyps 01/23/2010   Descending  . Diabetes mellitus   . Diverticulosis   . Gastric ulcer   . GI bleed   . Hypertension   . Hypotension    transient  . OSA (obstructive sleep apnea)    Severe, on CPAP therapy  . Sleep apnea     Past Surgical History:  Procedure Laterality Date  . CARDIAC CATHETERIZATION  05/01/2007   Recommend rotational atherectomy followed by PTCA and probable stenting of LAD.  Marland Kitchen CARDIAC CATHETERIZATION  05/28/2007   Mid LAD hihgh grade 80-90% stenosis, stented with a 3x31mm Cypher stent implanted at 16atm, postdilated with a 3.25x29mm Quantum at 16atm, which revealed excellent wall appoistion - 5.1mm  . CARDIOVASCULAR STRESS TEST   02/05/2011   Perfusion defect seen in inferior myocardial region consistent with diagphragmatic attenuation. Remaining myocardium demonstrates normal myocardial perfusion with no evidence of ischemia or infarct. ECG is positive for ischemia.  . CPAP/BIPAP SLEEP STUDY  07/01/2007   AHI-0.5/hr at 11cm water pressure, RDI-12.6/hr  . LOWER ARTERIAL DOPPLER  06/03/2007   No evidence of dissection, AV fistula, or active pseudoaneurysm.  Marland Kitchen SLEEP STUDY  05/13/2007   AHI-11.52/hr, AHI REM-56.0/hr, RDI-11.7/hr, RDI REM-56.0/hr, avg oxygen sat-94%  . TRANSTHORACIC ECHOCARDIOGRAM  03/13/2010   EF >71%, normal diastolic function, mild MR, mild TR, and normal RVSP    Social History   Socioeconomic History  . Marital status: Married    Spouse name: Not on file  . Number of children: 2  . Years of education: Not on file  . Highest education level: Not on file  Occupational History  . Occupation: retired  Tobacco Use  . Smoking status: Former Smoker    Quit date: 01/28/1978    Years since quitting: 42.3  . Smokeless tobacco: Never Used  Vaping Use  . Vaping Use: Never used  Substance and Sexual Activity  . Alcohol use: Yes    Comment: rarely  .  Drug use: No  . Sexual activity: Not on file  Other Topics Concern  . Not on file  Social History Narrative  . Not on file   Social Determinants of Health   Financial Resource Strain: Not on file  Food Insecurity: Not on file  Transportation Needs: Not on file  Physical Activity: Not on file  Stress: Not on file  Social Connections: Not on file  Intimate Partner Violence: Not on file    Family History  Problem Relation Age of Onset  . Stroke Brother   . Hypertension Brother   . Alzheimer's disease Maternal Grandmother   . Stroke Maternal Grandfather   . Heart disease Maternal Grandfather   . Stroke Paternal Grandmother   . Heart attack Paternal Grandfather   . Hypertension Father   . Alzheimer's disease Father   . Parkinson's disease  Father   . Alzheimer's disease Mother     Anti-infectives: Anti-infectives (From admission, onward)   None      Current Outpatient Medications  Medication Sig Dispense Refill  . acetaminophen (TYLENOL) 650 MG CR tablet Take 650 mg by mouth every 8 (eight) hours as needed.     Marland Kitchen Apoaequorin (PREVAGEN PO) Take 20 mg by mouth daily.    Marland Kitchen aspirin 81 MG tablet Take 81 mg by mouth daily.    . carvedilol (COREG) 3.125 MG tablet TAKE 1 TABLET EVERY DAY (NEED MD APPOINTMENT FOR REFILLS) 90 tablet 1  . CINNAMON PO Take 1 tablet by mouth 2 (two) times daily. 1000 mg tablets    . ezetimibe (ZETIA) 10 MG tablet Take 1 tablet (10 mg total) by mouth daily. 90 tablet 3  . FIBER ADULT GUMMIES PO Take by mouth.    Marland Kitchen FLUAD QUADRIVALENT 0.5 ML injection     . ketoconazole (NIZORAL) 2 % cream Apply 1 application topically daily.    Marland Kitchen lisinopril (ZESTRIL) 5 MG tablet TAKE 1/2 TABLET EVERY DAY (NEED MD APPOINTMENT FOR REFILLS) 45 tablet 1  . metFORMIN (GLUCOPHAGE) 500 MG tablet     . NON FORMULARY Beta Prostate. Take 1 capsule by mouth twice daily.    . NON FORMULARY CPAP    . pantoprazole (PROTONIX) 40 MG tablet TAKE 1 TABLET EVERY DAY 90 tablet 3  . psyllium (METAMUCIL) 58.6 % packet Take 1 packet by mouth daily.    . simvastatin (ZOCOR) 40 MG tablet Take 1 tablet (40 mg total) by mouth at bedtime. 90 tablet 3  . fluticasone (CUTIVATE) 0.05 % cream Apply 1 application topically daily.     No current facility-administered medications for this visit.     Objective: Vital signs in last 24 hours: There were no vitals taken for this visit.  Intake/Output from previous day: No intake/output data recorded. Intake/Output this shift: @IOTHISSHIFT @   Physical Exam      Studies/Results: CT and bone scan reviewed.     Pathology reviewed.    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   He is not a good candidate for surgical therapy at his age and with his comorbidities.  I have  discussed surveillance vs radiation therapy with EXRT w/wo seed boost and w/o ADT.  I have reviewed the risks of radiation therapy and ADT in detail and will have him see Dr. Tammi Klippel for further discussion about radiation therapy.  I will get him scheduled for f/u for possible initiation of ADT based on Dr. Johny Shears recommendations.   ED.  I explained that active treatment of  prostate cancer with radiation and EXRT will have an adverse impact on his sexual function.    No orders of the defined types were placed in this encounter.    Orders Placed This Encounter  Procedures  . Ambulatory referral to Radiation Oncology    Referral Priority:   Routine    Referral Type:   Consultation    Referral Reason:   Specialty Services Required    Requested Specialty:   Radiation Oncology    Number of Visits Requested:   1     Return for I will arrange f/u after he has seen Dr. Tammi Klippel with radiation oncology. .    CC: Dr. Iona Beard and Dr. Shelva Majestic.    Irine Seal 06/08/2020 [phone]

## 2020-06-13 DIAGNOSIS — I10 Essential (primary) hypertension: Secondary | ICD-10-CM | POA: Diagnosis not present

## 2020-06-13 DIAGNOSIS — C61 Malignant neoplasm of prostate: Secondary | ICD-10-CM | POA: Diagnosis not present

## 2020-06-13 DIAGNOSIS — M150 Primary generalized (osteo)arthritis: Secondary | ICD-10-CM | POA: Diagnosis not present

## 2020-06-13 DIAGNOSIS — G4733 Obstructive sleep apnea (adult) (pediatric): Secondary | ICD-10-CM | POA: Diagnosis not present

## 2020-06-13 DIAGNOSIS — E1169 Type 2 diabetes mellitus with other specified complication: Secondary | ICD-10-CM | POA: Diagnosis not present

## 2020-06-15 ENCOUNTER — Other Ambulatory Visit: Payer: Medicare HMO | Admitting: Urology

## 2020-06-20 ENCOUNTER — Encounter: Payer: Self-pay | Admitting: Radiation Oncology

## 2020-06-20 NOTE — Progress Notes (Addendum)
GU Location of Tumor / Histology: prostatic adenocarcinoma  If Prostate Cancer, Gleason Score is (4 + 4) and PSA is (3.58). Prostate volume: 35.6 mL.  Jordan [name] was referred by his PCP, Dr. Iona Beard to Dr. Jeffie Pollock for further evaluation of an elevated PSA.  02/02/2018 psa 1.7 02/15/2020 psa 3.58   Biopsies of prostate (if applicable) revealed:    Past/Anticipated interventions by urology, if any: prostate biopsy, CT abd/pelvis (negative), bone scan (neagtive), referral to Dr. Tammi Klippel to discuss radiation options.  Patient has not received any hormone therapy  Past/Anticipated interventions by medical oncology, if any: no  Weight changes, if any: denies  Bowel/Bladder complaints, if any: IPSS 1. SHIM 1 (no sexual activity). Denies dysuria or hematuria. Reports rare scant leakage associated with urinary urgency. Denies any bowel complaints.   Nausea/Vomiting, if any: denies  Pain issues, if any:  Reports chronic arthritic pain all over but worse in his left shoulder. Denies any new pain.  SAFETY ISSUES:  Prior radiation? denies  Pacemaker/ICD? denies  Possible current pregnancy? no, male patient  Is the patient on methotrexate? denies  Current Complaints / other details:  75 year old male. Married with 2 children.

## 2020-06-21 ENCOUNTER — Ambulatory Visit
Admission: RE | Admit: 2020-06-21 | Discharge: 2020-06-21 | Disposition: A | Discharge status: Home / Self Care | Payer: Medicare HMO | Source: Ambulatory Visit | Attending: Radiation Oncology | Admitting: Radiation Oncology

## 2020-06-21 ENCOUNTER — Encounter: Payer: Self-pay | Admitting: Radiation Oncology

## 2020-06-21 ENCOUNTER — Other Ambulatory Visit: Payer: Self-pay

## 2020-06-21 DIAGNOSIS — C61 Malignant neoplasm of prostate: Secondary | ICD-10-CM | POA: Insufficient documentation

## 2020-06-21 HISTORY — DX: Malignant neoplasm of prostate: C61

## 2020-06-21 NOTE — Progress Notes (Signed)
Radiation Oncology         [phone] ________________________________  Initial outpatient Consultation- Conducted via telephone due to current COVID-19 concerns for limiting patient exposure  Name: Jordan [name] MRN: [id number]  Date: 06/21/2020  DOB: 1945-12-05  [id number], Berneta Sages, MD  Irine Seal, MD   REFERRING PHYSICIAN: Irine Seal, MD  DIAGNOSIS: 75 y.o. 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    ICD-10-CM   1. Malignant neoplasm of prostate (Jordan [name])  C61     HISTORY OF PRESENT ILLNESS: Jordan [name] is a 75 y.o. male with a diagnosis of prostate cancer. He was noted to have a rising PSA of 3.58 on 02/15/20, increased from 1.7 in December 2019, by his primary care physician, [name].  Accordingly, he was referred for evaluation in urology by Dr. Jeffie Pollock on 04/13/2020,  digital rectal examination was performed at that time revealing a firm ridge on the right side of the prostate.  The patient proceeded to transrectal ultrasound with 12 biopsies of the prostate on 05/11/2020.  The prostate volume measured 35.6 cc.  Out of 12 core biopsies, 6 were positive, all on the right.  The maximum Gleason score was 4+4, and this was seen in the right base, right base lateral, right mid lateral, right apex and right apex lateral.  Additionally, Gleason 4+3 was seen in the right mid gland.  He proceeded with CT A/P and bone scan on 06/01/2020 for disease staging purposes.  Both of these studies were without any evidence for visceral or osseous metastatic disease.  The patient reviewed the biopsy results with his urologist and he has kindly been referred today for discussion of potential radiation treatment options.   PREVIOUS RADIATION THERAPY: No  PAST MEDICAL HISTORY:  Past Medical History:  Diagnosis Date  . Arthritis   . CAD (coronary artery disease)   . Cancer [facility])    Prostate  . Colon polyps 01/23/2010   Descending  . Diabetes  mellitus   . Diverticulosis   . Gastric ulcer   . GI bleed   . Hypertension   . Hypotension    transient  . OSA (obstructive sleep apnea)    Severe, on CPAP therapy  . Prostate cancer ([place])   . Sleep apnea       PAST SURGICAL HISTORY: Past Surgical History:  Procedure Laterality Date  . CARDIAC CATHETERIZATION  05/01/2007   Recommend rotational atherectomy followed by PTCA and probable stenting of LAD.  Marland Kitchen CARDIAC CATHETERIZATION  05/28/2007   Mid LAD hihgh grade 80-90% stenosis, stented with a 3x107mm Cypher stent implanted at 16atm, postdilated with a 3.25x15mm Quantum at 16atm, which revealed excellent wall appoistion - 5.28mm  . CARDIOVASCULAR STRESS TEST  02/05/2011   Perfusion defect seen in inferior myocardial region consistent with diagphragmatic attenuation. Remaining myocardium demonstrates normal myocardial perfusion with no evidence of ischemia or infarct. ECG is positive for ischemia.  . CPAP/BIPAP SLEEP STUDY  07/01/2007   AHI-0.5/hr at 11cm water pressure, RDI-12.6/hr  . LOWER ARTERIAL DOPPLER  06/03/2007   No evidence of dissection, AV fistula, or active pseudoaneurysm.  Marland Kitchen SLEEP STUDY  05/13/2007   AHI-11.52/hr, AHI REM-56.0/hr, RDI-11.7/hr, RDI REM-56.0/hr, avg oxygen sat-94%  . TRANSTHORACIC ECHOCARDIOGRAM  03/13/2010   EF >18%, normal diastolic function, mild MR, mild TR, and normal RVSP    FAMILY HISTORY:  Family History  Problem Relation Age of Onset  . Stroke Brother   . Hypertension  Brother   . Alzheimer's disease Maternal Grandmother   . Stroke Maternal Grandfather   . Heart disease Maternal Grandfather   . Stroke Paternal Grandmother   . Heart attack Paternal Grandfather   . Hypertension Father   . Alzheimer's disease Father   . Parkinson's disease Father   . Alzheimer's disease Mother   . Pancreatic cancer Maternal Uncle   . Breast cancer Neg Hx   . Prostate cancer Neg Hx   . Colon cancer Neg Hx     SOCIAL HISTORY:  Social History   Socioeconomic  History  . Marital status: Married    Spouse name: Not on file  . Number of children: 2  . Years of education: Not on file  . Highest education level: Not on file  Occupational History  . Occupation: retired  Tobacco Use  . Smoking status: Former Smoker    Packs/day: 0.50    Years: 16.00    Pack years: 8.00    Types: Cigarettes    Quit date: 01/28/1978    Years since quitting: 42.4  . Smokeless tobacco: Never Used  Vaping Use  . Vaping Use: Never used  Substance and Sexual Activity  . Alcohol use: Yes    Comment: rarely  . Drug use: No  . Sexual activity: Yes  Other Topics Concern  . Not on file  Social History Narrative  . Not on file   Social Determinants of Health   Financial Resource Strain: Not on file  Food Insecurity: Not on file  Transportation Needs: Not on file  Physical Activity: Not on file  Stress: Not on file  Social Connections: Not on file  Intimate Partner Violence: Not on file    ALLERGIES: Codeine and Statins  MEDICATIONS:  Current Outpatient Medications  Medication Sig Dispense Refill  . acetaminophen (TYLENOL) 650 MG CR tablet Take 650 mg by mouth every 8 (eight) hours as needed.     Marland Kitchen Apoaequorin (PREVAGEN PO) Take 20 mg by mouth daily.    Marland Kitchen aspirin 81 MG tablet Take 81 mg by mouth daily.    . carvedilol (COREG) 3.125 MG tablet TAKE 1 TABLET EVERY DAY (NEED MD APPOINTMENT FOR REFILLS) 90 tablet 1  . CINNAMON PO Take 1 tablet by mouth 2 (two) times daily. 1000 mg tablets    . FIBER ADULT GUMMIES PO Take by mouth.    . fluticasone (CUTIVATE) 0.05 % cream Apply 1 application topically daily.    Marland Kitchen ketoconazole (NIZORAL) 2 % cream Apply 1 application topically daily.    Marland Kitchen lisinopril (ZESTRIL) 5 MG tablet TAKE 1/2 TABLET EVERY DAY (NEED MD APPOINTMENT FOR REFILLS) 45 tablet 1  . metFORMIN (GLUCOPHAGE) 500 MG tablet     . NON FORMULARY Beta Prostate. Take 1 capsule by mouth twice daily.    . NON FORMULARY CPAP    . pantoprazole (PROTONIX) 40 MG  tablet TAKE 1 TABLET EVERY DAY 90 tablet 3  . psyllium (METAMUCIL) 58.6 % packet Take 1 packet by mouth daily.    . rosuvastatin (CRESTOR) 5 MG tablet Take 5 mg by mouth daily.     No current facility-administered medications for this encounter.    REVIEW OF SYSTEMS:  On review of systems, the patient reports that he is doing well overall. He denies any chest pain, shortness of breath, cough, fevers, chills, night sweats, unintended weight changes. He denies any bowel disturbances, and denies abdominal pain, nausea or vomiting. He denies any new musculoskeletal or joint aches or  pains. His IPSS was 1, indicating mild urinary symptoms with nocturia x1 only. His SHIM was 1, indicating he has severe erectile dysfunction. A complete review of systems is obtained and is otherwise negative.    PHYSICAL EXAM:  Wt Readings from Last 3 Encounters:  06/21/20 275 lb (124.7 kg)  04/25/20 278 lb 6.4 oz (126.3 kg)  04/13/20 275 lb (124.7 kg)   Temp Readings from Last 3 Encounters:  05/11/20 98.8 F (37.1 C) (Oral)  04/13/20 98.4 F (36.9 C)  07/05/19 97.7 F (36.5 C)   BP Readings from Last 3 Encounters:  05/11/20 (!) 146/76  04/25/20 110/60  04/13/20 136/83   Pulse Readings from Last 3 Encounters:  05/11/20 75  04/25/20 (!) 49  04/13/20 67   Pain Assessment Pain Score: 2  Pain Frequency: Constant Pain Loc: Shoulder (chronic arthritic pain)/Unable to assess due to telephone consult visit format.  KPS = 90  100 - Normal; no complaints; no evidence of disease. 90   - Able to carry on normal activity; minor signs or symptoms of disease. 80   - Normal activity with effort; some signs or symptoms of disease. 93   - Cares for self; unable to carry on normal activity or to do active work. 60   - Requires occasional assistance, but is able to care for most of his personal needs. 50   - Requires considerable assistance and frequent medical care. 2   - Disabled; requires special care and  assistance. 29   - Severely disabled; hospital admission is indicated although death not imminent. 27   - Very sick; hospital admission necessary; active supportive treatment necessary. 10   - Moribund; fatal processes progressing rapidly. 0     - Dead  Karnofsky DA, Abelmann [place], Craver LS and Burchenal [facility] [phone]) The use of the nitrogen mustards in the palliative treatment of carcinoma: with particular reference to bronchogenic carcinoma Cancer 1 [phone]  LABORATORY DATA:  Lab Results  Component Value Date   WBC 10.3 03/28/2009   HGB 13.3 03/28/2009   HCT 40.9 03/28/2009   MCV 90.6 03/28/2009   PLT 234.0 03/28/2009   Lab Results  Component Value Date   NA 139 10/03/2016   K 4.3 10/03/2016   CL 103 10/03/2016   CO2 26 10/03/2016   Lab Results  Component Value Date   ALT 14 10/03/2016   AST 15 10/03/2016   ALKPHOS 67 10/03/2016   BILITOT 0.4 10/03/2016     RADIOGRAPHY: NM Bone Scan Whole Body  Result Date: 06/02/2020 CLINICAL DATA:  Biopsy-proven prostate cancer EXAM: NUCLEAR MEDICINE WHOLE BODY BONE SCAN TECHNIQUE: Whole body anterior and posterior images were obtained approximately 3 hours after intravenous injection of radiopharmaceutical. RADIOPHARMACEUTICALS:  21 mCi Technetium-71m MDP IV COMPARISON:  Same day CT abdomen pelvis FINDINGS: No evidence of abnormal radiotracer uptake to suggest osseous metastatic disease. Multifocal radiotracer uptake involving the shoulders, spine, sternoclavicular joints, knees, ankles and feet in a pattern most consistent with degenerative arthropathy. Otherwise physiologic distribution of radiotracer activity. IMPRESSION: No definite scintigraphic evidence of osteoblastic metastatic disease. Electronically Signed   By: Dahlia Bailiff MD   On: 06/02/2020 10:18   CT ABDOMEN PELVIS W CONTRAST  Result Date: 06/02/2020 CLINICAL DATA:  Prostate cancer staging. EXAM: CT ABDOMEN AND PELVIS WITH CONTRAST TECHNIQUE: Multidetector CT imaging of the abdomen  and pelvis was performed using the standard protocol following bolus administration of intravenous contrast. CONTRAST:  123mL OMNIPAQUE IOHEXOL 300 MG/ML  SOLN COMPARISON:  None.  FINDINGS: Lower chest: No acute abnormality. Coronary artery calcifications. Normal size heart. No significant pericardial effusion/thickening. Hepatobiliary: Diffusely decreased attenuation of the hepatic parenchyma suggestive of hepatic steatosis. Hypodense subcentimeter lesion in the left lobe of the liver on image 23/2, technically too small to accurately characterize but favored benign cysts/meningioma. No suspicious solid hepatic lesion. Gallbladder is unremarkable. No biliary ductal dilation. Pancreas: No acute abnormality. Spleen: No acute abnormality. Adrenals/Urinary Tract: Bilateral adrenal glands are unremarkable. No hydronephrosis. Punctate nonobstructive bilateral nephrolithiasis measuring up to 2 mm. Subcentimeter bilateral hypodense renal lesions which are technically too small to accurately characterize but statistically likely represent cysts. No solid enhancing renal lesions. Symmetric enhancement and excretion of contrast from bilateral kidneys. No suspicious filling defect visualized within the opacified portions of the collecting system and ureters on delayed imaging. Urinary bladder is grossly unremarkable for degree of distension. Stomach/Bowel: Stomach is within normal limits. Appendix appears normal. Sigmoid colonic diverticulosis without findings of acute diverticulitis. No evidence of bowel wall thickening, distention, or inflammatory changes. Vascular/Lymphatic: Aortic atherosclerosis. No pathologically enlarged abdominal or pelvic lymph nodes. Reproductive: Heterogeneous nodular enhancement of the right side of the gland. Other: Small fat and nonobstructed bowel containing periumbilical hernia. No abdominopelvic ascites. Musculoskeletal: Multilevel degenerative changes spine. No aggressive lytic or blastic  lesion of bone. IMPRESSION: 1. Biopsy-proven prostate cancer with heterogeneous nodular enhancement of the right side of the prostate gland, poorly evaluated on CT. 2. No evidence of metastatic disease within the abdomen or pelvis. 3. Punctate nonobstructive bilateral nephrolithiasis. 4. Sigmoid colonic diverticulosis without findings of acute diverticulitis. 5. Small fat and nonobstructed bowel containing periumbilical hernia. 6. Hepatic steatosis. 7. Aortic atherosclerosis. Aortic Atherosclerosis (ICD10-I70.0). Electronically Signed   By: Dahlia Bailiff MD   On: 06/02/2020 10:16      IMPRESSION/PLAN:  This visit was conducted via telephone to spare the patient unnecessary potential exposure in the healthcare setting during the current COVID-19 pandemic. 1. 75 y.o. gentleman with Stage T2b adenocarcinoma of the prostate with Gleason Score of 4+4, and PSA of 3.58. We discussed the patient's workup and outlined the nature of prostate cancer in this setting. The patient's T stage, Gleason's score, and PSA put him into the high risk group. Accordingly, he is eligible for a variety of potential treatment options including prostatectomy or LT-ADT in combination with either 8 weeks of external radiation or 5 weeks of external radiation with an upfront brachytherapy boost.  Given his advanced age and multiple comorbidities, he is not felt to be an ideal surgical candidate.  We discussed the available radiation techniques, and focused on the details and logistics of delivery. We discussed and outlined the risks, benefits, short and long-term effects associated with radiotherapy and compared and contrasted these with prostatectomy. We discussed the role of SpaceOAR gel in reducing the rectal toxicity associated with radiotherapy. We also detailed the role of ADT in the treatment of high risk prostate cancer and outlined the associated side effects that could be expected with this therapy.  He appears to have a good  understanding of his disease and our treatment recommendations which are of curative intent.  He was encouraged to ask questions that were answered to his stated satisfaction.  At the conclusion of our conversation, the patient is interested in moving forward with brachytherapy boost and use of SpaceOAR gel followed by a 5 week course of daily radiotherapy.  This will be used in combination with LT-ADT. We will share our discussion with Dr. Jeffie Pollock and coordinate for a  follow-up visit, first available, to start ADT now.  He understands the rationale behind the intentional 70-month delay in starting radiotherapy after initiation of ADT, to allow for the radiosensitizing effect of this therapy.  Therefore, we will coordinate for the brachytherapy boost procedure, sometime in early July 2022, approximately 2 months after starting ADT.  At the time of his post-seed follow-up, he will have a prostate CT simulation in preparation for a 5-week course of daily external beam radiotherapy which we anticipate to begin approximately 3 weeks after his seed boost procedure.  He understands that he will be contacted by Romie Jumper in our office who will be working closely with him to coordinate OR scheduling and pre and post procedure appointments.  We will contact the pharmaceutical rep to ensure that [place] is available at the time of procedure.  He is comfortable and in agreement with the stated plan.  We enjoyed meeting with him today and look forward to continuing to participate in his care.  Given current concerns for patient exposure during the COVID-19 pandemic, this encounter was conducted via telephone. The patient was notified in advance and was offered a [place] meeting to allow for face to face communication but unfortunately reported that he/she did not have the appropriate resources/technology to support such a visit and instead preferred to proceed with telephone consult. The patient has given verbal consent  for this type of encounter. The time spent during this encounter was 60 minutes. The attendants for this meeting include Tyler Pita MD, patient, Corneluis Allston. Bruna and family members. During the encounter, Tyler Pita MD, was located at [facility] Radiation Oncology Department.  Patient, Bartley Vuolo. Mittleman and family were located at home.  (I spent 60 min on this encounter - MM)   Nicholos Johns, PA-C    Tyler Pita, MD  [place]: [phone]  Fax: [phone] [email or web address]  Skype  LinkedIn

## 2020-06-22 ENCOUNTER — Telehealth: Payer: Self-pay

## 2020-06-22 NOTE — Telephone Encounter (Signed)
-----   Message from Irine Seal, MD sent at 06/22/2020  1:30 PM EDT ----- We need to get him in to discuss ADT and give [place] 240 if agreeable.  Please set up asap.   Thanks.  ----- Message ----- From: Freeman Caldron, PA-C Sent: 06/22/2020  11:15 AM EDT To: Irine Seal, MD  Just Juluis Rainier- this patient is interested in moving forward with brachytherapy boost and use of SpaceOAR gel followed by a 5 week course of daily radiotherapy, here in [place], concurrent with LT-ADT. He will need a follow-up visit, first available, to start ADT now and we will coordinate for the brachytherapy boost procedure, sometime in early July 2022, approximately 2 months after starting ADT.  At the time of his post-seed follow-up, he will have a prostate CT simulation in preparation for a 5-week course of daily external beam radiotherapy which we anticipate to begin approximately 3 weeks after his seed boost procedure. -Ashlyn

## 2020-06-22 NOTE — Telephone Encounter (Signed)
Called patient and left message to return call to office to schedule appt to start ADT.

## 2020-06-27 ENCOUNTER — Telehealth: Payer: Self-pay | Admitting: *Deleted

## 2020-06-27 NOTE — Telephone Encounter (Signed)
Patient returned call and scheduled OV to discuss firmagon

## 2020-06-27 NOTE — Telephone Encounter (Signed)
CALLED PATIENT TO INFORM OF ADT FOR 06-29-20 @ 2:30 PM WITH DR. Jeffie Pollock IN [place], SPOKE WITH PATIENT AND HE IS AWARE OF THIS APPT.

## 2020-06-29 ENCOUNTER — Encounter: Payer: Self-pay | Admitting: Medical Oncology

## 2020-06-29 ENCOUNTER — Other Ambulatory Visit: Payer: Self-pay

## 2020-06-29 ENCOUNTER — Ambulatory Visit: Payer: Medicare HMO | Admitting: Urology

## 2020-06-29 VITALS — BP 126/73 | HR 56 | Temp 98.5°F | Ht 66.0 in | Wt 275.0 lb

## 2020-06-29 DIAGNOSIS — C61 Malignant neoplasm of prostate: Secondary | ICD-10-CM | POA: Diagnosis not present

## 2020-06-29 LAB — URINALYSIS, ROUTINE W REFLEX MICROSCOPIC
Bilirubin, UA: NEGATIVE
Glucose, UA: NEGATIVE
Ketones, UA: NEGATIVE
Leukocytes,UA: NEGATIVE
Nitrite, UA: NEGATIVE
Protein,UA: NEGATIVE
RBC, UA: NEGATIVE
Specific Gravity, UA: 1.015 (ref 1.005–1.030)
Urobilinogen, Ur: 0.2 mg/dL (ref 0.2–1.0)
pH, UA: 7.5 (ref 5.0–7.5)

## 2020-06-29 MED ORDER — DEGARELIX ACETATE(240 MG DOSE) 120 MG/VIAL ~~LOC~~ SOLR
240.0000 mg | Freq: Once | SUBCUTANEOUS | Status: AC
Start: 2020-06-29 — End: 2020-06-29
  Administered 2020-06-29: 240 mg via SUBCUTANEOUS

## 2020-06-29 NOTE — Patient Instructions (Signed)
Hormone Suppression Therapy for Prostate Cancer  Hormone suppression therapy is a treatment that can help to slow the growth of cancer cells in the prostate gland. It is also called androgen deprivation therapy (ADT) or androgen suppression therapy. Hormone suppression therapy targets hormones in the body that help cancer cells grow-these hormones are called androgens. Hormone suppression therapy alone will not cure prostate cancer, but it can slow the growth of cancer cells and may shrink tumors over time. Your health care provider can help you find the best treatment that fits your lifestyle. Hormone suppression therapy may be used in the following cases:  When prostate cancer has spread too far to other places in the body and cannot be cured by surgery or radiation.  When a person has health problems that prevent them from having surgery or radiation.  Before radiation to help shrink the size of the cancer and make the radiation treatment more effective.  If the prostate cancer remains or comes back following treatment with surgery or radiation. What are the types of hormone suppression therapy? Orchiectomy Orchiectomy, also called surgical castration, is a surgery to remove one or both testicles. The testicles make the two main androgens-testosterone and dihydrotestosterone. This surgery reduces the levels of testosterone in the blood, leading to decreased androgen production. Medicine therapy Medicine therapy, also called medical or chemical castration, involves taking medicines to keep your body from making or using androgens. Medicines can do this in one of three ways: 1. Reducing androgen production by the testicles: ? Luteinizing hormone-releasing hormone (LHRH) agonist medicines. These medicines are injected or implanted under your skin to lower the amount of androgens that your testicles make. If you take these medicines, you may also be prescribed other medicines to help with side  effects. ? LHRH antagonist medicines. These medicines also work to lower the amount of androgens made in the testicles but they work faster than LHRH agonist medicines and have less severe side effects. They are given as a monthly injection under the skin, and they are used when prostate cancer is in an advanced stage. ? Estrogens (male hormones). These medicines help reduce androgen production by the testicles. Usually, other types of hormone suppression therapy are used first based. If they do not work well or if they stop working, then estrogen may be given. 2. Blocking androgen attachment throughout the body. ? Anti-androgen medicines, also called androgen receptor antagonists, block areas on the body where androgens attach. These are pills that are usually used in combination with other types of hormone suppression therapy, like orchiectomy and other medicines. 3. Blocking androgen production throughout the body. ? Androgen synthesis inhibitor medicines. These medicines help to stop other areas of the body from making androgens. They are taken as pills. They may be used if the prostate cancer is advanced and has not gotten better with surgery or other medicines. A steroid medicine may be given with this type of medicine to help with side effects. What are the risks? Hormone suppression therapy may cause side effects, including:  Hot flashes.  Sexual side effects: ? Decrease or lack of sexual desire. ? Decrease in size of penis or testicles. ? Inability to get an erection (erectile dysfunction, or impotence). ? Breast tenderness or increase in breast size.  Fatigue.  Weight gain.  Thinning of the bones (osteoporosis) or loss of muscle mass.  Depression.  Increased cholesterol levels.  Trouble with thinking or focusing. Hormone suppression therapy may also increase your risk of high blood pressure (  hypertension), stroke, heart attack, or diabetes. What are the benefits? One of the  main benefits of hormone suppression therapy is having additional treatment options. You may have only one type of treatment or two or more types at the same time. Treatments may be combined to:  Help with side effects.  Treat advanced cancer. Contact a health care provider if:  You have pain or side effects that do not get better with treatment.  You have trouble urinating.  You have new side effects that do not go away. Get help right away if:  You have severe chest pain.  You have trouble breathing.  You have an irregular heartbeat.  You have numbness or paralysis in the lower half of your body.  You are confused.  You have trouble talking or understanding speech. These symptoms may be an emergency. Do not wait to see if the symptoms will go away. Get medical help right away. Call your local emergency services (911 in the [place] Do not drive yourself to the hospital. Summary  Hormone suppression therapy is a treatment that can help to slow the growth of cancer cells in the prostate (prostate gland).  Hormone suppression therapy alone will not cure prostate cancer, but it can slow the growth of prostate cancer and may shrink tumors over time.  Treatment to suppress hormones may include surgery or medicines. This information is not intended to replace advice given to you by your health care provider. Make sure you discuss any questions you have with your health care provider. Document Revised: 01/07/2019 Document Reviewed: 01/07/2019 Elsevier Patient Education  2021 [employer]  

## 2020-06-29 NOTE — Progress Notes (Signed)
Subjective: 1. Prostate cancer ([place])    06/29/20:  Mr. Seminara returns today to discuss LT-ADT as a component of treatment of his Gleason 8 high volume prostate cancer.   I have reviewed the risks and side effects ADT in detail and will initiate firmagon today and continue that monthly.    06/08/20: Mr. Wimberly returns today in f/u to discuss the results of his prostate biopsy and staging studies.  He was found to have a 35.74ml prostate with 6/6 right cores positive for cancer.  5/6 were GG4 with up to 90% involvement and 1/6 was GG3.  He has high risk disease and has undergone staging with bone scan and CT with both being negative.  He has stage T2b N0 M0 cancer.  He has DM, CAD and HTN as well as morbid obesity but swims regularly and is compliant with his meds.  He should have a reasonable 5 year life expectancy.   Hx: He  is a 75 yo male who is sent by [name] for an increase in his PSA from 1.7 on 02/02/18 to 3.58 on 02/15/20.  He is voiding well with nocturia 1-2x.  His IPSS is 1. He has a good stream but it sprays a bit.  He has ED but is no longer responding to sildenafil.  He has had no UTI's or GU surgery.  His father had some prostate issues, possibly cancer, but he had other medical issues so he was not felt to be a biopsy candidate.  ROS:  ROS  Allergies  Allergen Reactions  . Codeine   . Statins Other (See Comments)    Muscle aches Muscle aches    Past Medical History:  Diagnosis Date  . Arthritis   . CAD (coronary artery disease)   . Cancer [facility])    Prostate  . Colon polyps 01/23/2010   Descending  . Diabetes mellitus   . Diverticulosis   . Gastric ulcer   . GI bleed   . Hypertension   . Hypotension    transient  . OSA (obstructive sleep apnea)    Severe, on CPAP therapy  . Prostate cancer ([place])   . Sleep apnea     Past Surgical History:  Procedure Laterality Date  . CARDIAC CATHETERIZATION  05/01/2007   Recommend rotational atherectomy followed by PTCA and  probable stenting of LAD.  Marland Kitchen CARDIAC CATHETERIZATION  05/28/2007   Mid LAD hihgh grade 80-90% stenosis, stented with a 3x65mm Cypher stent implanted at 16atm, postdilated with a 3.25x2mm Quantum at 16atm, which revealed excellent wall appoistion - 5.34mm  . CARDIOVASCULAR STRESS TEST  02/05/2011   Perfusion defect seen in inferior myocardial region consistent with diagphragmatic attenuation. Remaining myocardium demonstrates normal myocardial perfusion with no evidence of ischemia or infarct. ECG is positive for ischemia.  . CPAP/BIPAP SLEEP STUDY  07/01/2007   AHI-0.5/hr at 11cm water pressure, RDI-12.6/hr  . LOWER ARTERIAL DOPPLER  06/03/2007   No evidence of dissection, AV fistula, or active pseudoaneurysm.  Marland Kitchen SLEEP STUDY  05/13/2007   AHI-11.52/hr, AHI REM-56.0/hr, RDI-11.7/hr, RDI REM-56.0/hr, avg oxygen sat-94%  . TRANSTHORACIC ECHOCARDIOGRAM  03/13/2010   EF >84%, normal diastolic function, mild MR, mild TR, and normal RVSP    Social History   Socioeconomic History  . Marital status: Married    Spouse name: Not on file  . Number of children: 2  . Years of education: Not on file  . Highest education level: Not on file  Occupational History  . Occupation: retired  Tobacco Use  . Smoking status: Former Smoker    Packs/day: 0.50    Years: 16.00    Pack years: 8.00    Types: Cigarettes    Quit date: 01/28/1978    Years since quitting: 42.4  . Smokeless tobacco: Never Used  Vaping Use  . Vaping Use: Never used  Substance and Sexual Activity  . Alcohol use: Yes    Comment: rarely  . Drug use: No  . Sexual activity: Yes  Other Topics Concern  . Not on file  Social History Narrative  . Not on file   Social Determinants of Health   Financial Resource Strain: Not on file  Food Insecurity: Not on file  Transportation Needs: Not on file  Physical Activity: Not on file  Stress: Not on file  Social Connections: Not on file  Intimate Partner Violence: Not on file    Family  History  Problem Relation Age of Onset  . Stroke Brother   . Hypertension Brother   . Alzheimer's disease Maternal Grandmother   . Stroke Maternal Grandfather   . Heart disease Maternal Grandfather   . Stroke Paternal Grandmother   . Heart attack Paternal Grandfather   . Hypertension Father   . Alzheimer's disease Father   . Parkinson's disease Father   . Alzheimer's disease Mother   . Pancreatic cancer Maternal Uncle   . Breast cancer Neg Hx   . Prostate cancer Neg Hx   . Colon cancer Neg Hx     Anti-infectives: Anti-infectives (From admission, onward)   None      Current Outpatient Medications  Medication Sig Dispense Refill  . acetaminophen (TYLENOL) 650 MG CR tablet Take 650 mg by mouth every 8 (eight) hours as needed.     Marland Kitchen Apoaequorin (PREVAGEN PO) Take 20 mg by mouth daily.    Marland Kitchen aspirin 81 MG tablet Take 81 mg by mouth daily.    . carvedilol (COREG) 3.125 MG tablet TAKE 1 TABLET EVERY DAY (NEED MD APPOINTMENT FOR REFILLS) 90 tablet 1  . CINNAMON PO Take 1 tablet by mouth 2 (two) times daily. 1000 mg tablets    . FIBER ADULT GUMMIES PO Take by mouth.    Marland Kitchen ketoconazole (NIZORAL) 2 % cream Apply 1 application topically daily.    Marland Kitchen lisinopril (ZESTRIL) 5 MG tablet TAKE 1/2 TABLET EVERY DAY (NEED MD APPOINTMENT FOR REFILLS) 45 tablet 1  . metFORMIN (GLUCOPHAGE) 500 MG tablet     . NON FORMULARY Beta Prostate. Take 1 capsule by mouth twice daily.    . NON FORMULARY CPAP    . pantoprazole (PROTONIX) 40 MG tablet TAKE 1 TABLET EVERY DAY 90 tablet 3  . psyllium (METAMUCIL) 58.6 % packet Take 1 packet by mouth daily.    . rosuvastatin (CRESTOR) 5 MG tablet Take 5 mg by mouth daily.    . fluticasone (CUTIVATE) 0.05 % cream Apply 1 application topically daily.     No current facility-administered medications for this visit.     Objective: Vital signs in last 24 hours: BP 126/73   Pulse (!) 56   Temp 98.5 F (36.9 C)   Ht 5\' 6"  (1.676 m)   Wt 275 lb (124.7 kg)   BMI  44.39 kg/m   Intake/Output from previous day: No intake/output data recorded. Intake/Output this shift: @IOTHISSHIFT @   Physical Exam      Studies/Results: I have reviewed the notes from Dr. Tammi Klippel.     Assessment/Plan: T2b N0 M0 GG4 high risk  prostate cancer.   ADT side effects reviewed and Mills Koller initiated today.  With his cardiac history I will keep him on Firmagon and have him return in a months with a PSA and testosterone level for his next injection.   ED.  I explained that active treatment of prostate cancer with radiation and EXRT will have an adverse impact on his sexual function.    Meds ordered this encounter  Medications  . degarelix [facility]) injection 240 mg     Orders Placed This Encounter  Procedures  . Urinalysis, Routine w reflex microscopic  . PSA    Standing Status:   Future    Standing Expiration Date:   09/29/2020  . Testosterone    Standing Status:   Future    Standing Expiration Date:   09/29/2020     Return in about 1 month (around 07/30/2020) for PSA and testosterone prior to 1 month OV for firmagon 80mg  and a preop visit. .    CC: Dr. Iona Beard, Dr. Ledon Snare  and Dr. Shelva Majestic.    Irine Seal 06/29/2020 [phone] ID: Dereck Leep, male   DOB: 1945/11/02, 75 y.o.   MRN: [id number]

## 2020-06-29 NOTE — Progress Notes (Signed)

## 2020-07-05 ENCOUNTER — Ambulatory Visit: Payer: Medicare HMO | Admitting: Adult Health

## 2020-07-27 ENCOUNTER — Other Ambulatory Visit: Payer: Self-pay

## 2020-07-27 ENCOUNTER — Other Ambulatory Visit: Payer: Medicare HMO

## 2020-07-27 DIAGNOSIS — C61 Malignant neoplasm of prostate: Secondary | ICD-10-CM

## 2020-07-28 LAB — PSA: Prostate Specific Ag, Serum: 1.2 ng/mL (ref 0.0–4.0)

## 2020-07-28 LAB — TESTOSTERONE: Testosterone: 22 ng/dL — ABNORMAL LOW (ref 264–916)

## 2020-07-31 ENCOUNTER — Other Ambulatory Visit: Payer: Self-pay | Admitting: Urology

## 2020-07-31 ENCOUNTER — Ambulatory Visit (INDEPENDENT_AMBULATORY_CARE_PROVIDER_SITE_OTHER): Payer: Medicare HMO | Admitting: Podiatry

## 2020-07-31 ENCOUNTER — Encounter: Payer: Self-pay | Admitting: Podiatry

## 2020-07-31 ENCOUNTER — Other Ambulatory Visit: Payer: Self-pay

## 2020-07-31 DIAGNOSIS — L84 Corns and callosities: Secondary | ICD-10-CM

## 2020-07-31 DIAGNOSIS — E119 Type 2 diabetes mellitus without complications: Secondary | ICD-10-CM

## 2020-07-31 DIAGNOSIS — B351 Tinea unguium: Secondary | ICD-10-CM

## 2020-07-31 DIAGNOSIS — M79675 Pain in left toe(s): Secondary | ICD-10-CM | POA: Diagnosis not present

## 2020-07-31 DIAGNOSIS — M79674 Pain in right toe(s): Secondary | ICD-10-CM

## 2020-07-31 DIAGNOSIS — E1149 Type 2 diabetes mellitus with other diabetic neurological complication: Secondary | ICD-10-CM | POA: Diagnosis not present

## 2020-07-31 DIAGNOSIS — C61 Malignant neoplasm of prostate: Secondary | ICD-10-CM

## 2020-07-31 NOTE — Progress Notes (Signed)
Subjective: Jordan [name] is a pleasant 75 y.o. male patient seen today for diabetic foot care with h/o painful thick toenails that are difficult to trim. Pain interferes with ambulation. Aggravating factors include wearing enclosed shoe gear. Pain is relieved with periodic professional debridement.  He states his blood glucose was 104 mg/dl two days ago. He voices no new pedal problems on today's visit. He states he has been diagnosed with stage 2 prostate cancer and will start treatment in July.   PCP is Iona Beard, MD. Last visit was: two months ago. He has upcoming appointment in July.  Allergies  Allergen Reactions  . Codeine   . Statins Other (See Comments)    Muscle aches Muscle aches   Objective: Physical Exam  General: Jordan [name] is a pleasant 75 y.o. African American male, morbidly obese in NAD. AAO x 3.   Vascular:  Capillary fill time to digits <3 seconds b/l lower extremities. Palpable DP pulse(s) b/l lower extremities Palpable PT pulse(s) b/l lower extremities Pedal hair absent. Lower extremity skin temperature gradient within normal limits. No pain with calf compression b/l.  Dermatological:  Pedal skin with normal turgor, texture and tone bilaterally. No open wounds bilaterally. No interdigital macerations bilaterally. Toenails 1-5 b/l elongated, discolored, dystrophic, thickened, crumbly with subungual debris and tenderness to dorsal palpation. Incurvated nailplate medial border(s) L hallux and R hallux.  Nail border hypertrophy absent. There is tenderness to palpation. Sign(s) of infection: no clinical signs of infection noted on examination today. Hyperkeratotic lesion(s) L 2nd toe, R 2nd toe, submet head 1 left foot and submet head 1 right foot.  No erythema, no edema, no drainage, no fluctuance.  Musculoskeletal:  Normal muscle strength 5/5 to all lower extremity muscle groups bilaterally. No pain crepitus or joint limitation noted with ROM b/l. Hammertoe(s) noted to  the 2-5 bilaterally. Utilizes cane for ambulation assistance.  Neurological:  Protective sensation intact 5/5 intact bilaterally with 10g monofilament b/l.  Assessment and Plan:  1. Pain due to onychomycosis of toenails of both feet   2. Corns and callosities   3. Type 2 diabetes mellitus without complication, without long-term current use of insulin (HCC)     -Examined patient. -Patient to continue soft, supportive shoe gear daily. -Toenails 1-5 b/l were debrided in length and girth with sterile nail nippers and dremel without iatrogenic bleeding.  -Offending nail border debrided and curretaged L hallux and R hallux utilizing sterile nail nipper and currette. Border(s) cleansed with alcohol and triple antibiotic ointment applied. Patient instructed to apply Neosporin to L hallux and R hallux once daily for 7 days. -Corn(s) L 2nd toe and R 2nd toe and callus(es) submet head 1 left foot and submet head 1 right foot were pared utilizing sterile scalpel blade without incident. Total number debrided =4. -Patient to report any pedal injuries to medical professional immediately. -Patient/POA to call should there be question/concern in the interim.  Return in about 3 months (around 10/31/2020).  Marzetta Board, DPM

## 2020-07-31 NOTE — Patient Instructions (Signed)
Apply Neosporin to both great toes once daily for one week.  Call office if you have any problems.

## 2020-08-01 ENCOUNTER — Telehealth: Payer: Self-pay | Admitting: *Deleted

## 2020-08-01 NOTE — Telephone Encounter (Signed)
Called patient to inform of pre-seed appts. for 08-15-20 and his implant for 09-08-20, spoke with patient and he is aware of these appts.

## 2020-08-03 ENCOUNTER — Ambulatory Visit (INDEPENDENT_AMBULATORY_CARE_PROVIDER_SITE_OTHER): Payer: Medicare HMO | Admitting: Urology

## 2020-08-03 ENCOUNTER — Encounter: Payer: Self-pay | Admitting: Urology

## 2020-08-03 ENCOUNTER — Other Ambulatory Visit: Payer: Self-pay

## 2020-08-03 VITALS — BP 117/72 | HR 73 | Ht 66.0 in

## 2020-08-03 DIAGNOSIS — C61 Malignant neoplasm of prostate: Secondary | ICD-10-CM

## 2020-08-03 LAB — URINALYSIS, ROUTINE W REFLEX MICROSCOPIC
Bilirubin, UA: NEGATIVE
Glucose, UA: NEGATIVE
Ketones, UA: NEGATIVE
Leukocytes,UA: NEGATIVE
Nitrite, UA: NEGATIVE
Protein,UA: NEGATIVE
Specific Gravity, UA: 1.015 (ref 1.005–1.030)
Urobilinogen, Ur: 0.2 mg/dL (ref 0.2–1.0)
pH, UA: 5.5 (ref 5.0–7.5)

## 2020-08-03 LAB — MICROSCOPIC EXAMINATION
Bacteria, UA: NONE SEEN
Epithelial Cells (non renal): NONE SEEN /hpf (ref 0–10)
Renal Epithel, UA: NONE SEEN /hpf
WBC, UA: NONE SEEN /hpf (ref 0–5)

## 2020-08-03 MED ORDER — DEGARELIX ACETATE 80 MG ~~LOC~~ SOLR
80.0000 mg | Freq: Once | SUBCUTANEOUS | Status: AC
  Administered 2020-08-03: 80 mg via SUBCUTANEOUS

## 2020-08-03 NOTE — Progress Notes (Signed)
Subjective: 1. Prostate cancer ([place])    08/03/20: [name] returns today in f/u.  He is a month out from firmagon 240mg  and his PSA is down to 1.2 with a T of 22.  He is scheduled for a seed implant with [employer] on 09/08/20.  He is tolerating the injection well  with just mild hot flashes. He is voiding well with an IPSS of 2.  His UA is unremarkable.   06/29/20:  [name] returns today to discuss LT-ADT as a component of treatment of his Gleason 8 high volume prostate cancer.   I have reviewed the risks and side effects ADT in detail and will initiate firmagon today and continue that monthly.    06/08/20: [name] returns today in f/u to discuss the results of his prostate biopsy and staging studies.  He was found to have a 35.59ml prostate with 6/6 right cores positive for cancer.  5/6 were GG4 with up to 90% involvement and 1/6 was GG3.  He has high risk disease and has undergone staging with bone scan and CT with both being negative.  He has stage T2b N0 M0 cancer.  He has DM, CAD and HTN as well as morbid obesity but swims regularly and is compliant with his meds.  He should have a reasonable 5 year life expectancy.   Hx: He  is a 75 yo male who is sent by [name] for an increase in his PSA from 1.7 on 02/02/18 to 3.58 on 02/15/20.  He is voiding well with nocturia 1-2x.  His IPSS is 1. He has a good stream but it sprays a bit.  He has ED but is no longer responding to sildenafil.  He has had no UTI's or GU surgery.  His father had some prostate issues, possibly cancer, but he had other medical issues so he was not felt to be a biopsy candidate.  ROS:  ROS  Allergies  Allergen Reactions   Codeine    Statins Other (See Comments)    Muscle aches Muscle aches    Past Medical History:  Diagnosis Date   Arthritis    CAD (coronary artery disease)    Cancer ([place])    Prostate   Colon polyps 01/23/2010   Descending   Diabetes mellitus    Diverticulosis    Gastric ulcer    GI bleed     Hypertension    Hypotension    transient   OSA (obstructive sleep apnea)    Severe, on CPAP therapy   Prostate cancer [name])    Sleep apnea     Past Surgical History:  Procedure Laterality Date   CARDIAC CATHETERIZATION  05/01/2007   Recommend rotational atherectomy followed by PTCA and probable stenting of LAD.   CARDIAC CATHETERIZATION  05/28/2007   Mid LAD hihgh grade 80-90% stenosis, stented with a 3x67mm Cypher stent implanted at 16atm, postdilated with a 3.25x21mm Quantum at 16atm, which revealed excellent wall appoistion - 5.24mm   CARDIOVASCULAR STRESS TEST  02/05/2011   Perfusion defect seen in inferior myocardial region consistent with diagphragmatic attenuation. Remaining myocardium demonstrates normal myocardial perfusion with no evidence of ischemia or infarct. ECG is positive for ischemia.   CPAP/BIPAP SLEEP STUDY  07/01/2007   AHI-0.5/hr at 11cm water pressure, RDI-12.6/hr   LOWER ARTERIAL DOPPLER  06/03/2007   No evidence of dissection, AV fistula, or active pseudoaneurysm.   SLEEP STUDY  05/13/2007   AHI-11.52/hr, AHI REM-56.0/hr, RDI-11.7/hr, RDI REM-56.0/hr, avg oxygen sat-94%  TRANSTHORACIC ECHOCARDIOGRAM  03/13/2010   EF >51%, normal diastolic function, mild MR, mild TR, and normal RVSP    Social History   Socioeconomic History   Marital status: Married    Spouse name: Not on file   Number of children: 2   Years of education: Not on file   Highest education level: Not on file  Occupational History   Occupation: retired  Tobacco Use   Smoking status: Former    Packs/day: 0.50    Years: 16.00    Pack years: 8.00    Types: Cigarettes    Quit date: 01/28/1978    Years since quitting: 42.5   Smokeless tobacco: Never  Vaping Use   Vaping Use: Never used  Substance and Sexual Activity   Alcohol use: Yes    Comment: rarely   Drug use: No   Sexual activity: Yes  Other Topics Concern   Not on file  Social History Narrative   Not on file   Social Determinants  of Health   Financial Resource Strain: Not on file  Food Insecurity: Not on file  Transportation Needs: Not on file  Physical Activity: Not on file  Stress: Not on file  Social Connections: Not on file  Intimate Partner Violence: Not on file    Family History  Problem Relation Age of Onset   Stroke Brother    Hypertension Brother    Alzheimer's disease Maternal Grandmother    Stroke Maternal Grandfather    Heart disease Maternal Grandfather    Stroke Paternal Grandmother    Heart attack Paternal Grandfather    Hypertension Father    Alzheimer's disease Father    Parkinson's disease Father    Alzheimer's disease Mother    Pancreatic cancer Maternal Uncle    Breast cancer Neg Hx    Prostate cancer Neg Hx    Colon cancer Neg Hx     Anti-infectives: Anti-infectives (From admission, onward)    None       Current Outpatient Medications  Medication Sig Dispense Refill   acetaminophen (TYLENOL) 650 MG CR tablet Take 650 mg by mouth every 8 (eight) hours as needed.      Apoaequorin (PREVAGEN PO) Take 20 mg by mouth daily.     aspirin 81 MG tablet Take 81 mg by mouth daily.     carvedilol (COREG) 3.125 MG tablet TAKE 1 TABLET EVERY DAY (NEED MD APPOINTMENT FOR REFILLS) 90 tablet 1   CINNAMON PO Take 1 tablet by mouth 2 (two) times daily. 1000 mg tablets     FIBER ADULT GUMMIES PO Take by mouth.     fluticasone (CUTIVATE) 0.05 % cream Apply 1 application topically daily.     ketoconazole (NIZORAL) 2 % cream Apply 1 application topically daily.     lisinopril (ZESTRIL) 5 MG tablet TAKE 1/2 TABLET EVERY DAY (NEED MD APPOINTMENT FOR REFILLS) 45 tablet 1   metFORMIN (GLUCOPHAGE) 500 MG tablet      NON FORMULARY Beta Prostate. Take 1 capsule by mouth twice daily.     NON FORMULARY CPAP     pantoprazole (PROTONIX) 40 MG tablet TAKE 1 TABLET EVERY DAY 90 tablet 3   psyllium (METAMUCIL) 58.6 % packet Take 1 packet by mouth daily.     rosuvastatin (CRESTOR) 5 MG tablet Take 5 mg by  mouth daily.     No current facility-administered medications for this visit.     Objective: Vital signs in last 24 hours: BP 117/72   Pulse 73  Ht 5\' 6"  (1.676 m)   BMI 44.39 kg/m   Intake/Output from previous day: No intake/output data recorded. Intake/Output this shift: @IOTHISSHIFT @   Physical Exam Vitals reviewed.  Constitutional:      Appearance: Normal appearance. He is obese.  Cardiovascular:     Rate and Rhythm: Normal rate and regular rhythm.  Pulmonary:     Effort: Pulmonary effort is normal. No respiratory distress.     Breath sounds: Normal breath sounds.  Neurological:     Mental Status: He is alert.     Recent Results (from the past 2160 hour(s))  I-STAT creatinine     Status: None   Collection Time: 06/01/20  9:46 AM  Result Value Ref Range   Creatinine, Ser 0.80 0.61 - 1.24 mg/dL  Urinalysis, Routine w reflex microscopic     Status: None   Collection Time: 06/29/20  2:46 PM  Result Value Ref Range   Specific Gravity, UA 1.015 1.005 - 1.030   pH, UA 7.5 5.0 - 7.5   Color, UA Yellow Yellow   Appearance Ur Clear Clear   Leukocytes,UA Negative Negative   Protein,UA Negative Negative/Trace   Glucose, UA Negative Negative   Ketones, UA Negative Negative   RBC, UA Negative Negative   Bilirubin, UA Negative Negative   Urobilinogen, Ur 0.2 0.2 - 1.0 mg/dL   Nitrite, UA Negative Negative  Testosterone     Status: Abnormal   Collection Time: 07/27/20  9:29 AM  Result Value Ref Range   Testosterone 22 (L) 264 - 916 ng/dL    Comment: Adult male reference interval is based on a population of healthy nonobese males (BMI <30) between 76 and 6 years old. [place], [place] [phone]. PMID: [id number].   PSA     Status: None   Collection Time: 07/27/20  9:29 AM  Result Value Ref Range   Prostate Specific Ag, Serum 1.2 0.0 - 4.0 ng/mL    Comment: Roche ECLIA methodology. According to the American Urological Association, Serum PSA  should decrease and remain at undetectable levels after radical prostatectomy. The AUA defines biochemical recurrence as an initial PSA value 0.2 ng/mL or greater followed by a subsequent confirmatory PSA value 0.2 ng/mL or greater. Values obtained with different assay methods or kits cannot be used interchangeably. Results cannot be interpreted as absolute evidence of the presence or absence of malignant disease.   Urinalysis, Routine w reflex microscopic     Status: Abnormal   Collection Time: 08/03/20  2:12 PM  Result Value Ref Range   Specific Gravity, UA 1.015 1.005 - 1.030   pH, UA 5.5 5.0 - 7.5   Color, UA Yellow Yellow   Appearance Ur Clear Clear   Leukocytes,UA Negative Negative   Protein,UA Negative Negative/Trace   Glucose, UA Negative Negative   Ketones, UA Negative Negative   RBC, UA Trace (A) Negative   Bilirubin, UA Negative Negative   Urobilinogen, Ur 0.2 0.2 - 1.0 mg/dL   Nitrite, UA Negative Negative   Microscopic Examination See below:   Microscopic Examination     Status: None   Collection Time: 08/03/20  2:12 PM   Urine  Result Value Ref Range   WBC, UA None seen 0 - 5 /hpf   RBC 0-2 0 - 2 /hpf   Epithelial Cells (non renal) None seen 0 - 10 /hpf   Renal Epithel, UA None seen None seen /hpf   Mucus, UA Present Not Estab.   Bacteria, UA None seen None seen/Few  Studies/Results: I have reviewed the notes from Dr. Tammi Klippel.     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   He is tolerating firmagon and will continue that monthly for 54mo.   He will get the seed implant on 09/08/20.      Meds ordered this encounter  Medications   degarelix (FIRMAGON) injection 80 mg     Orders Placed This Encounter  Procedures   Microscopic Examination   Urinalysis, Routine w reflex microscopic     Return in about 1 month (around 09/02/2020) for for Mills Koller 80mg , nurse visit, continue monthly. See me 3 mo with PSA. Marland Kitchen    CC: Dr. Iona Beard,  Dr. Ledon Snare  and Dr. Shelva Majestic.    Irine Seal 08/03/2020 [phone] ID: Dereck Leep, male   DOB: December 16, 1945, 75 y.o.   MRN: [id number]

## 2020-08-03 NOTE — Progress Notes (Signed)
Firmagon Sub Q Injection  Due to Prostate Cancer patient is present today for a Firmagon Injection.   Medication: Mills Koller (Degarelix)  Dose: 80mg  Location: right upper abdomen Lot: [id number] Exp: 05 2024  Patient tolerated well, no complications were noted  Performed by: Akira Adelsberger, LPN  Follow up: Per MD note

## 2020-08-03 NOTE — Progress Notes (Signed)
Urological Symptom Review  Patient is experiencing the following symptoms: Get up at night to urinate Erection problems (male only)   Review of Systems  Gastrointestinal (upper)  : Negative for upper GI symptoms  Gastrointestinal (lower) : Negative for lower GI symptoms  Constitutional : Negative for symptoms  Skin: Negative for skin symptoms  Eyes: Negative for eye symptoms  Ear/Nose/Throat : Sinus problems   Hematologic/Lymphatic: Bruise or bleed easily.  Cardiovascular : Negative for cardiovascular symptoms  Respiratory : Negative for respiratory symptoms  Endocrine: Negative for endocrine symptoms  Musculoskeletal: Joint pain  Neurological: Negative for neurological symptoms  Psychologic: Negative for psychiatric symptoms

## 2020-08-07 ENCOUNTER — Other Ambulatory Visit: Payer: Self-pay

## 2020-08-07 ENCOUNTER — Ambulatory Visit (INDEPENDENT_AMBULATORY_CARE_PROVIDER_SITE_OTHER): Payer: Medicare HMO | Admitting: *Deleted

## 2020-08-07 DIAGNOSIS — E119 Type 2 diabetes mellitus without complications: Secondary | ICD-10-CM

## 2020-08-07 DIAGNOSIS — M2042 Other hammer toe(s) (acquired), left foot: Secondary | ICD-10-CM

## 2020-08-07 DIAGNOSIS — L84 Corns and callosities: Secondary | ICD-10-CM

## 2020-08-07 DIAGNOSIS — M2041 Other hammer toe(s) (acquired), right foot: Secondary | ICD-10-CM

## 2020-08-07 NOTE — Progress Notes (Signed)
Patient presents to the office today for diabetic shoe and insole measuring.  Patient was measured with brannock device to determine size and width for 1 pair of extra depth shoes and foam casted for 3 pair of insoles.   Documentation of medical necessity will be sent to patient's treating diabetic doctor to verify and sign.   Patient's diabetic provider: Dr. Iona Beard  Shoes and insoles will be ordered at that time and patient will be notified for an appointment for fitting when they arrive.   Shoe size (per patient): 10.5-11 wide   Brannock measurement: RIGHT / LEFT - 10.5 E  Patient shoe selection-   1st choice:   N/A  2nd choice:  N/A  Shoe size ordered: N/A   Patient wanted a pair of shoes just like the ones he had one. [employer] did not have that style of shoe, so he wanted to get his shoes from [employer] in [place]. I printed him out a prescription for shoes. He does want the insoles.   Our office will do documentation for insoles only and let him know once they arrive in office.

## 2020-08-14 ENCOUNTER — Telehealth: Payer: Self-pay | Admitting: *Deleted

## 2020-08-14 NOTE — Telephone Encounter (Signed)
CALLED PATIENT TO REMIND OF PRE-SEED APPTS. FOR 08-15-20, SPOKE WITH PATIENT AND HE IS AWARE OF THESE APPTS.

## 2020-08-15 ENCOUNTER — Encounter: Payer: Self-pay | Admitting: Urology

## 2020-08-15 ENCOUNTER — Telehealth: Payer: Self-pay

## 2020-08-15 ENCOUNTER — Encounter (HOSPITAL_COMMUNITY): Admission: RE | Admit: 2020-08-15 | Payer: Medicare HMO | Source: Ambulatory Visit

## 2020-08-15 ENCOUNTER — Encounter (HOSPITAL_COMMUNITY)
Admission: RE | Admit: 2020-08-15 | Discharge: 2020-08-15 | Disposition: A | Discharge status: Home / Self Care | Payer: Medicare HMO | Source: Ambulatory Visit | Attending: Urology | Admitting: Urology

## 2020-08-15 ENCOUNTER — Ambulatory Visit
Admission: RE | Admit: 2020-08-15 | Discharge: 2020-08-15 | Disposition: A | Discharge status: Home / Self Care | Payer: Medicare HMO | Source: Ambulatory Visit | Attending: Radiation Oncology | Admitting: Radiation Oncology

## 2020-08-15 ENCOUNTER — Ambulatory Visit
Admission: RE | Admit: 2020-08-15 | Discharge: 2020-08-15 | Disposition: A | Discharge status: Home / Self Care | Payer: Medicare HMO | Source: Ambulatory Visit | Attending: Urology | Admitting: Urology

## 2020-08-15 ENCOUNTER — Other Ambulatory Visit: Payer: Self-pay

## 2020-08-15 ENCOUNTER — Ambulatory Visit (HOSPITAL_COMMUNITY)
Admission: RE | Admit: 2020-08-15 | Discharge: 2020-08-15 | Disposition: A | Discharge status: Home / Self Care | Payer: Medicare HMO | Source: Ambulatory Visit | Attending: Urology | Admitting: Urology

## 2020-08-15 DIAGNOSIS — C61 Malignant neoplasm of prostate: Secondary | ICD-10-CM

## 2020-08-15 NOTE — Telephone Encounter (Signed)
Returned patients call has questions about lab work. Will find out and have him get a call back.

## 2020-08-15 NOTE — Progress Notes (Signed)
  Radiation Oncology         [phone] ________________________________  Name: Jordan [name] MRN: [id number]  Date: 08/15/2020  DOB: [birth year]  SIMULATION AND TREATMENT PLANNING NOTE PUBIC ARCH STUDY  UU:VOZD, Berneta Sages, MD  Irine Seal, MD  DIAGNOSIS:  75 y.o. 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  Oncology History  Malignant neoplasm of prostate ([place])  05/11/2020 Cancer Staging   Staging form: Prostate, AJCC 8th Edition - Clinical stage from 05/11/2020: Stage IIC (cT2b, cN0, cM0, PSA: 3.6, Grade Group: 4) - Signed by Freeman Caldron, PA-C on 06/21/2020  Histopathologic type: Adenocarcinoma, NOS  Stage prefix: Initial diagnosis  Prostate specific antigen (PSA) range: Less than 10  Gleason primary pattern: 4  Gleason secondary pattern: 4  Gleason score: 8  Histologic grading system: 5 grade system  Number of biopsy cores examined: 12  Number of biopsy cores positive: 6  Location of positive needle core biopsies: One side    06/21/2020 Initial Diagnosis   Malignant neoplasm of prostate ([place])        ICD-10-CM   1. Malignant neoplasm of prostate ([place])  C61       COMPLEX SIMULATION:  The patient presented today for evaluation for possible prostate seed implant. He was brought to the radiation planning suite and placed supine on the CT couch. A 3-dimensional image study set was obtained in upload to the planning computer. There, on each axial slice, I contoured the prostate gland. Then, using three-dimensional radiation planning tools I reconstructed the prostate in view of the structures from the transperineal needle pathway to assess for possible pubic arch interference. In doing so, I did not appreciate any pubic arch interference. Also, the patient's prostate volume was estimated based on the drawn structure. The volume is 31 cc.  His TRUS volume prior to starting ADT was 35.6 cc. Given the pubic arch appearance  and prostate volume, patient remains a good candidate to proceed with prostate seed implant. Today, he freely provided informed written consent to proceed.    PLAN: The patient will undergo prostate seed implant boost followed by IMRT.   ________________________________  Sheral Apley. Tammi Klippel, M.D.

## 2020-08-15 NOTE — Progress Notes (Addendum)
Patient in for pre seed appointment. Patients AUA score is 5. Meaningful use questions complete.

## 2020-08-16 ENCOUNTER — Other Ambulatory Visit: Payer: Self-pay | Admitting: Cardiovascular Disease

## 2020-08-17 ENCOUNTER — Telehealth: Payer: Self-pay | Admitting: Podiatry

## 2020-08-17 NOTE — Telephone Encounter (Signed)
Received order from Dr Berdine Addison for diabetic shoes but it showed pts last visit was in august 2021. Pt has seen Dr Criss Rosales so I faxed the documents to Dr Fransico Setters office for a signature.  Notified pt.

## 2020-08-21 ENCOUNTER — Telehealth: Payer: Self-pay

## 2020-08-21 NOTE — Telephone Encounter (Signed)
Patient wanting to let Dr. Jeffie Pollock know he only wants to go thru with the seed implant procedure if he, Dr. Jeffie Pollock does it.  Thanks, Helene Kelp

## 2020-08-21 NOTE — Telephone Encounter (Signed)
See below

## 2020-08-24 NOTE — Telephone Encounter (Signed)
This will be taken care by [facility] since it will be done at [facility] correct?

## 2020-08-31 ENCOUNTER — Ambulatory Visit: Payer: Medicare HMO

## 2020-08-31 ENCOUNTER — Telehealth: Payer: Self-pay | Admitting: *Deleted

## 2020-08-31 NOTE — Telephone Encounter (Signed)
Called patient to remind of lab appt. for 09-05-20, spoke with patient and he is aware of this appt.

## 2020-09-01 ENCOUNTER — Ambulatory Visit: Payer: Medicare HMO

## 2020-09-04 ENCOUNTER — Encounter (HOSPITAL_BASED_OUTPATIENT_CLINIC_OR_DEPARTMENT_OTHER): Payer: Self-pay | Admitting: Urology

## 2020-09-05 ENCOUNTER — Encounter (HOSPITAL_COMMUNITY)
Admission: RE | Admit: 2020-09-05 | Discharge: 2020-09-05 | Disposition: A | Discharge status: Home / Self Care | Payer: Medicare HMO | Source: Ambulatory Visit | Attending: Urology | Admitting: Urology

## 2020-09-05 ENCOUNTER — Encounter (HOSPITAL_BASED_OUTPATIENT_CLINIC_OR_DEPARTMENT_OTHER): Payer: Self-pay | Admitting: Urology

## 2020-09-05 ENCOUNTER — Ambulatory Visit (INDEPENDENT_AMBULATORY_CARE_PROVIDER_SITE_OTHER): Payer: Medicare HMO

## 2020-09-05 ENCOUNTER — Other Ambulatory Visit: Payer: Self-pay

## 2020-09-05 ENCOUNTER — Telehealth: Payer: Self-pay | Admitting: *Deleted

## 2020-09-05 DIAGNOSIS — C61 Malignant neoplasm of prostate: Secondary | ICD-10-CM

## 2020-09-05 DIAGNOSIS — Z01812 Encounter for preprocedural laboratory examination: Secondary | ICD-10-CM | POA: Diagnosis not present

## 2020-09-05 DIAGNOSIS — Z973 Presence of spectacles and contact lenses: Secondary | ICD-10-CM

## 2020-09-05 DIAGNOSIS — L309 Dermatitis, unspecified: Secondary | ICD-10-CM

## 2020-09-05 DIAGNOSIS — G629 Polyneuropathy, unspecified: Secondary | ICD-10-CM

## 2020-09-05 HISTORY — DX: Presence of spectacles and contact lenses: Z97.3

## 2020-09-05 HISTORY — DX: Dermatitis, unspecified: L30.9

## 2020-09-05 HISTORY — DX: Polyneuropathy, unspecified: G62.9

## 2020-09-05 LAB — COMPREHENSIVE METABOLIC PANEL
ALT: 19 U/L (ref 0–44)
AST: 19 U/L (ref 15–41)
Albumin: 4.1 g/dL (ref 3.5–5.0)
Alkaline Phosphatase: 58 U/L (ref 38–126)
Anion gap: 7 (ref 5–15)
BUN: 12 mg/dL (ref 8–23)
CO2: 29 mmol/L (ref 22–32)
Calcium: 9.5 mg/dL (ref 8.9–10.3)
Chloride: 105 mmol/L (ref 98–111)
Creatinine, Ser: 0.78 mg/dL (ref 0.61–1.24)
GFR, Estimated: >60 mL/min (ref >60)
Glucose, Bld: 115 mg/dL — ABNORMAL HIGH (ref 70–99)
Potassium: 4.1 mmol/L (ref 3.5–5.1)
Sodium: 141 mmol/L (ref 135–145)
Total Bilirubin: 0.6 mg/dL (ref 0.3–1.2)
Total Protein: 7.9 g/dL (ref 6.5–8.1)

## 2020-09-05 LAB — CBC
HCT: 44.3 % (ref 39.0–52.0)
Hemoglobin: 14.5 g/dL (ref 13.0–17.0)
MCH: 29.9 pg (ref 26.0–34.0)
MCHC: 32.7 g/dL (ref 30.0–36.0)
MCV: 91.3 fL (ref 80.0–100.0)
Platelets: 238 10*3/uL (ref 150–400)
RBC: 4.85 MIL/uL (ref 4.22–5.81)
RDW: 13.7 % (ref 11.5–15.5)
WBC: 8 10*3/uL (ref 4.0–10.5)
nRBC: 0 % (ref 0.0–0.2)

## 2020-09-05 LAB — PROTIME-INR
INR: 1 (ref 0.8–1.2)
Prothrombin Time: 13.2 seconds (ref 11.4–15.2)

## 2020-09-05 LAB — APTT: aPTT: 31 seconds (ref 24–36)

## 2020-09-05 MED ORDER — DEGARELIX ACETATE 80 MG ~~LOC~~ SOLR
80.0000 mg | Freq: Once | SUBCUTANEOUS | Status: AC
  Administered 2020-09-05: 80 mg via SUBCUTANEOUS

## 2020-09-05 NOTE — Progress Notes (Signed)
Firmagon Sub Q Injection  Due to Prostate Cancer patient is present today for a Firmagon Injection.   Medication: Mills Koller (Degarelix)  Dose: 80mg  Location: left upper abdomen   Patient tolerated well, no complications were noted  Performed by: Estill Bamberg RN

## 2020-09-05 NOTE — Progress Notes (Signed)
Spoke w/ via phone for pre-op interview---pt Lab needs dos----    none           Lab results------see below COVID test -----patient states asymptomatic no test needed Arrive at -------1045 am 09-08-2020 NPO after MN NO Solid Food.  Clear liquids from MN until---945 am then npo Med rec completed Medications to take morning of surgery -----carvedilol, pantoprazole, ezetimibe Diabetic medication -----none day of surgery Patient special instructions: bring cpap mask tubing and machine and leave in car Pre-Op special Istructions -----fleets enema am of surgery Patient verbalized understanding of instructions that were given at this phone interview. Patient denies shortness of breath, chest pain, fever, cough at this phone interview.   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  PCP: dr Berneta Sages [place] Cardiologist : dr Olga Millers 04-25-2020 epic Chest x-ray :6-21- 2022  chart/epic EKG : 04-25-2020 chart/epic Echo :01-13-2018 chart/eepic Stress test:3-6-107 chart/epic Cardiac Cath : 05-01-2007 epic Activity level: does own housework, can climb flight of stairs without problems. Sleep Study/ CPAP : uses  Fasting Blood Sugar :   108   / Checks Blood Sugar --some days ASA / Instructions/ Last Dose :  81 mg aspirin pt stopped on 09-03-2020 per dr gay wrenn instructions  Pt denies any plavix use in a long time

## 2020-09-05 NOTE — Telephone Encounter (Signed)
Returned patient's phone call, lvm for a return call 

## 2020-09-07 ENCOUNTER — Telehealth: Payer: Self-pay | Admitting: *Deleted

## 2020-09-07 NOTE — Telephone Encounter (Signed)
CALLED PATIENT TO REMIND OF PROCEDURE FOR 09-08-20, SPOKE WITH PATIENT AND HE IS AWARE OF THIS PROCEDURE

## 2020-09-07 NOTE — Anesthesia Preprocedure Evaluation (Addendum)
Anesthesia Evaluation  Patient identified by MRN, date of birth, ID band Patient awake    Reviewed: Allergy & Precautions, NPO status , Patient's Chart, lab work & pertinent test results, reviewed documented beta blocker date and time   History of Anesthesia Complications Negative for: history of anesthetic complications  Airway Mallampati: II  TM Distance: >3 FB Neck ROM: Full    Dental  (+) Partial Lower, Partial Upper   Pulmonary sleep apnea and Continuous Positive Airway Pressure Ventilation , former smoker,    Pulmonary exam normal        Cardiovascular hypertension, Pt. on medications and Pt. on home beta blockers + CAD  Normal cardiovascular exam  TTE 2017: EF 55%, grade 2 DD, valves normal    Neuro/Psych negative neurological ROS  negative psych ROS   GI/Hepatic Neg liver ROS, PUD, GERD  Medicated and Controlled,  Endo/Other  diabetes, Type 2, Oral Hypoglycemic AgentsMorbid obesity  Renal/GU negative Renal ROS  negative genitourinary   Musculoskeletal negative musculoskeletal ROS (+)   Abdominal   Peds  Hematology negative hematology ROS (+)   Anesthesia Other Findings Day of surgery medications reviewed with patient.  Reproductive/Obstetrics negative OB ROS                            Anesthesia Physical Anesthesia Plan  ASA: 3  Anesthesia Plan: General   Post-op Pain Management:    Induction: Intravenous  PONV Risk Score and Plan: 2 and Treatment may vary due to age or medical condition, Ondansetron and Dexamethasone  Airway Management Planned: LMA  Additional Equipment: None  Intra-op Plan:   Post-operative Plan: Extubation in OR  Informed Consent: I have reviewed the patients History and Physical, chart, labs and discussed the procedure including the risks, benefits and alternatives for the proposed anesthesia with the patient or authorized representative who has  indicated his/her understanding and acceptance.     Dental advisory given  Plan Discussed with: CRNA  Anesthesia Plan Comments:        Anesthesia Quick Evaluation

## 2020-09-08 ENCOUNTER — Ambulatory Visit (HOSPITAL_COMMUNITY): Payer: Medicare HMO

## 2020-09-08 ENCOUNTER — Ambulatory Visit (HOSPITAL_BASED_OUTPATIENT_CLINIC_OR_DEPARTMENT_OTHER): Payer: Medicare HMO | Admitting: Anesthesiology

## 2020-09-08 ENCOUNTER — Encounter (HOSPITAL_BASED_OUTPATIENT_CLINIC_OR_DEPARTMENT_OTHER): Payer: Self-pay | Admitting: Urology

## 2020-09-08 ENCOUNTER — Encounter (HOSPITAL_BASED_OUTPATIENT_CLINIC_OR_DEPARTMENT_OTHER)
Admission: RE | Disposition: A | Discharge status: Home / Self Care | Payer: Self-pay | Source: Other Acute Inpatient Hospital | Attending: Urology

## 2020-09-08 ENCOUNTER — Ambulatory Visit (HOSPITAL_BASED_OUTPATIENT_CLINIC_OR_DEPARTMENT_OTHER)
Admission: RE | Admit: 2020-09-08 | Discharge: 2020-09-08 | Disposition: A | Discharge status: Home / Self Care | Payer: Medicare HMO | Source: Other Acute Inpatient Hospital | Attending: Urology | Admitting: Urology

## 2020-09-08 DIAGNOSIS — I10 Essential (primary) hypertension: Secondary | ICD-10-CM | POA: Insufficient documentation

## 2020-09-08 DIAGNOSIS — Z6841 Body Mass Index (BMI) 40.0 and over, adult: Secondary | ICD-10-CM | POA: Insufficient documentation

## 2020-09-08 DIAGNOSIS — Z888 Allergy status to other drugs, medicaments and biological substances status: Secondary | ICD-10-CM | POA: Insufficient documentation

## 2020-09-08 DIAGNOSIS — Z885 Allergy status to narcotic agent status: Secondary | ICD-10-CM | POA: Insufficient documentation

## 2020-09-08 DIAGNOSIS — C61 Malignant neoplasm of prostate: Secondary | ICD-10-CM | POA: Insufficient documentation

## 2020-09-08 DIAGNOSIS — Z79899 Other long term (current) drug therapy: Secondary | ICD-10-CM | POA: Diagnosis not present

## 2020-09-08 DIAGNOSIS — Z955 Presence of coronary angioplasty implant and graft: Secondary | ICD-10-CM | POA: Insufficient documentation

## 2020-09-08 DIAGNOSIS — Z7982 Long term (current) use of aspirin: Secondary | ICD-10-CM | POA: Insufficient documentation

## 2020-09-08 DIAGNOSIS — E119 Type 2 diabetes mellitus without complications: Secondary | ICD-10-CM | POA: Insufficient documentation

## 2020-09-08 DIAGNOSIS — Z7984 Long term (current) use of oral hypoglycemic drugs: Secondary | ICD-10-CM | POA: Diagnosis not present

## 2020-09-08 DIAGNOSIS — Z87891 Personal history of nicotine dependence: Secondary | ICD-10-CM | POA: Insufficient documentation

## 2020-09-08 DIAGNOSIS — Z8249 Family history of ischemic heart disease and other diseases of the circulatory system: Secondary | ICD-10-CM | POA: Insufficient documentation

## 2020-09-08 DIAGNOSIS — E1159 Type 2 diabetes mellitus with other circulatory complications: Secondary | ICD-10-CM | POA: Diagnosis not present

## 2020-09-08 DIAGNOSIS — I2510 Atherosclerotic heart disease of native coronary artery without angina pectoris: Secondary | ICD-10-CM | POA: Insufficient documentation

## 2020-09-08 HISTORY — PX: SPACE OAR INSTILLATION: SHX6769

## 2020-09-08 HISTORY — DX: Hyperlipidemia, unspecified: E78.5

## 2020-09-08 HISTORY — PX: CYSTOSCOPY: SHX5120

## 2020-09-08 HISTORY — PX: RADIOACTIVE SEED IMPLANT: SHX5150

## 2020-09-08 LAB — GLUCOSE, CAPILLARY
Glucose-Capillary: 103 mg/dL — ABNORMAL HIGH (ref 70–99)
Glucose-Capillary: 97 mg/dL (ref 70–99)

## 2020-09-08 SURGERY — INSERTION, RADIATION SOURCE, PROSTATE
Anesthesia: General | Site: Rectum

## 2020-09-08 MED ORDER — FLEET ENEMA 7-19 GM/118ML RE ENEM: 1.0000 | ENEMA | Freq: Once | RECTAL | Status: DC

## 2020-09-08 MED ORDER — LIDOCAINE HCL (PF) 2 % IJ SOLN
INTRAMUSCULAR | Status: AC
  Filled 2020-09-08: qty 5

## 2020-09-08 MED ORDER — CIPROFLOXACIN IN D5W 400 MG/200ML IV SOLN
400.0000 mg | INTRAVENOUS | Status: AC
  Administered 2020-09-08: 400 mg via INTRAVENOUS

## 2020-09-08 MED ORDER — ACETAMINOPHEN 500 MG PO TABS
1000.0000 mg | ORAL_TABLET | Freq: Once | ORAL | Status: AC
  Administered 2020-09-08: 1000 mg via ORAL

## 2020-09-08 MED ORDER — SODIUM CHLORIDE 0.9 % IR SOLN
Status: DC | PRN
  Administered 2020-09-08: 1000 mL via INTRAVESICAL

## 2020-09-08 MED ORDER — SODIUM CHLORIDE (PF) 0.9 % IJ SOLN
INTRAMUSCULAR | Status: DC | PRN
  Administered 2020-09-08: 10 mL

## 2020-09-08 MED ORDER — PROPOFOL 10 MG/ML IV BOLUS
INTRAVENOUS | Status: AC
  Filled 2020-09-08: qty 20

## 2020-09-08 MED ORDER — SUGAMMADEX SODIUM 200 MG/2ML IV SOLN
INTRAVENOUS | Status: DC | PRN
  Administered 2020-09-08: 300 mg via INTRAVENOUS

## 2020-09-08 MED ORDER — HYDROCODONE-ACETAMINOPHEN 5-325 MG PO TABS: 1.0000 | ORAL_TABLET | Freq: Four times a day (QID) | ORAL | 0 refills | Status: DC | PRN

## 2020-09-08 MED ORDER — EPHEDRINE 5 MG/ML INJ
INTRAVENOUS | Status: AC
  Filled 2020-09-08: qty 10

## 2020-09-08 MED ORDER — OXYCODONE HCL 5 MG/5ML PO SOLN
5.0000 mg | Freq: Once | ORAL | Status: DC | PRN
Start: 2020-09-08 — End: 2020-09-08

## 2020-09-08 MED ORDER — OXYCODONE HCL 5 MG PO TABS
5.0000 mg | ORAL_TABLET | Freq: Once | ORAL | Status: DC | PRN
Start: 2020-09-08 — End: 2020-09-08

## 2020-09-08 MED ORDER — FENTANYL CITRATE (PF) 100 MCG/2ML IJ SOLN
INTRAMUSCULAR | Status: AC
  Filled 2020-09-08: qty 2

## 2020-09-08 MED ORDER — LIDOCAINE 2% (20 MG/ML) 5 ML SYRINGE
INTRAMUSCULAR | Status: DC | PRN
  Administered 2020-09-08: 100 mg via INTRAVENOUS

## 2020-09-08 MED ORDER — CIPROFLOXACIN IN D5W 400 MG/200ML IV SOLN
INTRAVENOUS | Status: AC
  Filled 2020-09-08: qty 200

## 2020-09-08 MED ORDER — ONDANSETRON HCL 4 MG/2ML IJ SOLN
INTRAMUSCULAR | Status: AC
  Filled 2020-09-08: qty 2

## 2020-09-08 MED ORDER — EPHEDRINE SULFATE-NACL 50-0.9 MG/10ML-% IV SOSY
PREFILLED_SYRINGE | INTRAVENOUS | Status: DC | PRN
  Administered 2020-09-08: 10 mg via INTRAVENOUS

## 2020-09-08 MED ORDER — ROCURONIUM BROMIDE 10 MG/ML (PF) SYRINGE
PREFILLED_SYRINGE | INTRAVENOUS | Status: DC | PRN
  Administered 2020-09-08: 60 mg via INTRAVENOUS
  Administered 2020-09-08: 10 mg via INTRAVENOUS

## 2020-09-08 MED ORDER — PROPOFOL 10 MG/ML IV BOLUS
INTRAVENOUS | Status: DC | PRN
  Administered 2020-09-08: 50 mg via INTRAVENOUS
  Administered 2020-09-08: 150 mg via INTRAVENOUS

## 2020-09-08 MED ORDER — SUGAMMADEX SODIUM 500 MG/5ML IV SOLN
INTRAVENOUS | Status: AC
  Filled 2020-09-08: qty 5

## 2020-09-08 MED ORDER — SODIUM CHLORIDE 0.9% FLUSH: 3.0000 mL | Freq: Two times a day (BID) | INTRAVENOUS | Status: DC

## 2020-09-08 MED ORDER — IOHEXOL 300 MG/ML  SOLN
INTRAMUSCULAR | Status: DC | PRN
  Administered 2020-09-08: 7 mL

## 2020-09-08 MED ORDER — FENTANYL CITRATE (PF) 100 MCG/2ML IJ SOLN
INTRAMUSCULAR | Status: DC | PRN
  Administered 2020-09-08: 100 ug via INTRAVENOUS
  Administered 2020-09-08: 25 ug via INTRAVENOUS

## 2020-09-08 MED ORDER — ONDANSETRON HCL 4 MG/2ML IJ SOLN
INTRAMUSCULAR | Status: DC | PRN
  Administered 2020-09-08: 4 mg via INTRAVENOUS

## 2020-09-08 MED ORDER — ACETAMINOPHEN 500 MG PO TABS
ORAL_TABLET | ORAL | Status: AC
  Filled 2020-09-08: qty 2

## 2020-09-08 MED ORDER — LACTATED RINGERS IV SOLN: INTRAVENOUS | Status: DC

## 2020-09-08 MED ORDER — PROMETHAZINE HCL 25 MG/ML IJ SOLN: 6.2500 mg | INTRAMUSCULAR | Status: DC | PRN

## 2020-09-08 MED ORDER — FENTANYL CITRATE (PF) 100 MCG/2ML IJ SOLN: 25.0000 ug | INTRAMUSCULAR | Status: DC | PRN

## 2020-09-08 MED ORDER — STERILE WATER FOR IRRIGATION IR SOLN
Status: DC | PRN
  Administered 2020-09-08: 500 mL

## 2020-09-08 MED ORDER — ROCURONIUM BROMIDE 10 MG/ML (PF) SYRINGE
PREFILLED_SYRINGE | INTRAVENOUS | Status: AC
  Filled 2020-09-08: qty 10

## 2020-09-08 SURGICAL SUPPLY — 43 items
BAG DRN RND TRDRP ANRFLXCHMBR (UROLOGICAL SUPPLIES) ×3
BAG URINE DRAIN 2000ML AR STRL (UROLOGICAL SUPPLIES) ×4 IMPLANT
BLADE CLIPPER SENSICLIP SURGIC (BLADE) ×4 IMPLANT
CATH FOLEY 2WAY SLVR  5CC 16FR (CATHETERS) ×4
CATH FOLEY 2WAY SLVR 5CC 16FR (CATHETERS) ×3 IMPLANT
CATH ROBINSON RED A/P 16FR (CATHETERS) IMPLANT
CATH ROBINSON RED A/P 20FR (CATHETERS) ×4 IMPLANT
CLOTH BEACON ORANGE TIMEOUT ST (SAFETY) ×4 IMPLANT
CNTNR URN SCR LID CUP LEK RST (MISCELLANEOUS) ×6 IMPLANT
CONT SPEC 4OZ STRL OR WHT (MISCELLANEOUS) ×8
COVER BACK TABLE 60X90IN (DRAPES) ×4 IMPLANT
COVER MAYO STAND STRL (DRAPES) ×4 IMPLANT
DRAPE C-ARM 35X43 STRL (DRAPES) ×4 IMPLANT
DRSG TEGADERM 4X4.75 (GAUZE/BANDAGES/DRESSINGS) ×7 IMPLANT
DRSG TEGADERM 8X12 (GAUZE/BANDAGES/DRESSINGS) ×8 IMPLANT
GAUZE SPONGE 4X4 12PLY STRL (GAUZE/BANDAGES/DRESSINGS) ×1 IMPLANT
GLOVE SURG ENC MOIS LTX SZ6.5 (GLOVE) ×4 IMPLANT
GLOVE SURG ENC MOIS LTX SZ7 (GLOVE) ×1 IMPLANT
GLOVE SURG ENC MOIS LTX SZ7.5 (GLOVE) IMPLANT
GLOVE SURG ENC MOIS LTX SZ8 (GLOVE) ×1 IMPLANT
GLOVE SURG ORTHO LTX SZ8.5 (GLOVE) ×4 IMPLANT
GLOVE SURG POLYISO LF SZ8 (GLOVE) ×8 IMPLANT
GLOVE SURG UNDER POLY LF SZ6.5 (GLOVE) IMPLANT
GLOVE SURG UNDER POLY LF SZ7 (GLOVE) ×1 IMPLANT
GLOVE SURG UNDER POLY LF SZ7.5 (GLOVE) ×1 IMPLANT
GOWN STRL REUS W/TWL LRG LVL3 (GOWN DISPOSABLE) ×4 IMPLANT
GOWN STRL REUS W/TWL XL LVL3 (GOWN DISPOSABLE) ×5 IMPLANT
HOLDER FOLEY CATH W/STRAP (MISCELLANEOUS) ×4 IMPLANT
I SEED AGX100 ×51 IMPLANT
IMPL SPACEOAR VUE SYSTEM (Spacer) ×3 IMPLANT
IMPLANT SPACEOAR VUE SYSTEM (Spacer) ×4 IMPLANT
IV NS 1000ML (IV SOLUTION) ×4
IV NS 1000ML BAXH (IV SOLUTION) ×3 IMPLANT
KIT TURNOVER CYSTO (KITS) ×4 IMPLANT
MARKER SKIN DUAL TIP RULER LAB (MISCELLANEOUS) ×4 IMPLANT
PACK CYSTO (CUSTOM PROCEDURE TRAY) ×4 IMPLANT
SURGILUBE 2OZ TUBE FLIPTOP (MISCELLANEOUS) IMPLANT
SUT BONE WAX W31G (SUTURE) IMPLANT
SYR 10ML LL (SYRINGE) IMPLANT
TOWEL OR 17X26 10 PK STRL BLUE (TOWEL DISPOSABLE) ×4 IMPLANT
UNDERPAD 30X36 HEAVY ABSORB (UNDERPADS AND DIAPERS) ×8 IMPLANT
WATER STERILE IRR 3000ML UROMA (IV SOLUTION) ×4 IMPLANT
WATER STERILE IRR 500ML POUR (IV SOLUTION) ×4 IMPLANT

## 2020-09-08 NOTE — H&P (Signed)
Subjective: 09/08/20: Jordan [name] returns today for a Seed Implant with [employer].   08/03/20: Jordan [name] returns today in f/u.  He is a month out from firmagon 240mg  and his PSA is down to 1.2 with a T of 22.  He is scheduled for a seed implant with [employer] on 09/08/20.  He is tolerating the injection well  with just mild hot flashes. He is voiding well with an IPSS of 2.  His UA is unremarkable.   06/29/20:  Jordan [name] returns today to discuss LT-ADT as a component of treatment of his Gleason 8 high volume prostate cancer.   I have reviewed the risks and side effects ADT in detail and will initiate firmagon today and continue that monthly.    06/08/20: Jordan [name] returns today in f/u to discuss the results of his prostate biopsy and staging studies.  He was found to have a 35.40ml prostate with 6/6 right cores positive for cancer.  5/6 were GG4 with up to 90% involvement and 1/6 was GG3.  He has high risk disease and has undergone staging with bone scan and CT with both being negative.  He has stage T2b N0 M0 cancer.  He has DM, CAD and HTN as well as morbid obesity but swims regularly and is compliant with his meds.  He should have a reasonable 5 year life expectancy.   Hx: He  is a 75 yo male who is sent by [name] for an increase in his PSA from 1.7 on 02/02/18 to 3.58 on 02/15/20.  He is voiding well with nocturia 1-2x.  His IPSS is 1. He has a good stream but it sprays a bit.  He has ED but is no longer responding to sildenafil.  He has had no UTI's or GU surgery.  His father had some prostate issues, possibly cancer, but he had other medical issues so he was not felt to be a biopsy candidate.  ROS:  ROS  Allergies  Allergen Reactions   Codeine     Rash itching   Statins Other (See Comments)    Muscle aches Muscle aches    Past Medical History:  Diagnosis Date   Arthritis    all over oa   CAD (coronary artery disease)    Cancer ([place])    Prostate   Colon polyps 01/23/2010   Descending    Diverticulosis    dm type 2    Eczema 09/05/2020   Gastric ulcer 2010   GI bleed 2010   Hyperlipidemia    Hypertension    Hypotension    transient   Neuropathy 09/05/2020   both hands and feet   OSA (obstructive sleep apnea)    Severe, on CPAP therapy   Prostate cancer ([place])    Wears glasses 09/05/2020    Past Surgical History:  Procedure Laterality Date   CARDIAC CATHETERIZATION  05/01/2007   Recommend rotational atherectomy followed by PTCA and probable stenting of LAD.   CARDIAC CATHETERIZATION  05/28/2007   Mid LAD hihgh grade 80-90% stenosis, stented with a 3x69mm Cypher stent implanted at 16atm, postdilated with a 3.25x50mm Quantum at 16atm, which revealed excellent wall appoistion - 5.60mm   CARDIOVASCULAR STRESS TEST  02/05/2011   Perfusion defect seen in inferior myocardial region consistent with diagphragmatic attenuation. Remaining myocardium demonstrates normal myocardial perfusion with no evidence of ischemia or infarct. ECG is positive for ischemia.   colonscopy  2012   CPAP/BIPAP SLEEP STUDY  07/01/2007   AHI-0.5/hr at 11cm  water pressure, RDI-12.6/hr   LOWER ARTERIAL DOPPLER  06/03/2007   No evidence of dissection, AV fistula, or active pseudoaneurysm.   NASAL ENDOSCOPY  12/2008   SLEEP STUDY  05/13/2007   AHI-11.52/hr, AHI REM-56.0/hr, RDI-11.7/hr, RDI REM-56.0/hr, avg oxygen sat-94%   stent to heart x 1  2010   TRANSTHORACIC ECHOCARDIOGRAM  03/13/2010   EF >17%, normal diastolic function, mild Jordan, mild TR, and normal RVSP    Social History   Socioeconomic History   Marital status: Married    Spouse name: Not on file   Number of children: 2   Years of education: Not on file   Highest education level: Not on file  Occupational History   Occupation: retired  Tobacco Use   Smoking status: Former    Packs/day: 0.50    Years: 16.00    Pack years: 8.00    Types: Cigarettes    Quit date: 01/28/1978    Years since quitting: 42.6   Smokeless tobacco:  Never  Vaping Use   Vaping Use: Never used  Substance and Sexual Activity   Alcohol use: Not Currently   Drug use: No   Sexual activity: Yes  Other Topics Concern   Not on file  Social History Narrative   Not on file   Social Determinants of Health   Financial Resource Strain: Not on file  Food Insecurity: Not on file  Transportation Needs: Not on file  Physical Activity: Not on file  Stress: Not on file  Social Connections: Not on file  Intimate Partner Violence: Not on file    Family History  Problem Relation Age of Onset   Stroke Brother    Hypertension Brother    Alzheimer's disease Maternal Grandmother    Stroke Maternal Grandfather    Heart disease Maternal Grandfather    Stroke Paternal Grandmother    Heart attack Paternal Grandfather    Hypertension Father    Alzheimer's disease Father    Parkinson's disease Father    Alzheimer's disease Mother    Pancreatic cancer Maternal Uncle    Breast cancer Neg Hx    Prostate cancer Neg Hx    Colon cancer Neg Hx     Anti-infectives: Anti-infectives (From admission, onward)    None       No current facility-administered medications for this encounter.   Current Outpatient Medications  Medication Sig Dispense Refill   simvastatin (ZOCOR) 40 MG tablet Take 40 mg by mouth at bedtime.     acetaminophen (TYLENOL) 650 MG CR tablet Take 650 mg by mouth every 8 (eight) hours as needed.      Apoaequorin (PREVAGEN PO) Take 20 mg by mouth daily.     aspirin 81 MG tablet Take 81 mg by mouth daily.     carvedilol (COREG) 3.125 MG tablet TAKE 1 TABLET EVERY DAY (NEED MD APPOINTMENT FOR REFILLS) (Patient taking differently: daily.) 90 tablet 3   CINNAMON PO Take 2 tablets by mouth 2 (two) times daily. 1000 mg tablet     FIBER ADULT GUMMIES PO Take by mouth at bedtime. 2 or 3 tabs per day     ketoconazole (NIZORAL) 2 % cream Apply 1 application topically daily.     lisinopril (ZESTRIL) 5 MG tablet TAKE 1/2 TABLET EVERY DAY  (NEED MD APPOINTMENT FOR REFILLS) (Patient taking differently: daily.) 45 tablet 3   metFORMIN (GLUCOPHAGE) 500 MG tablet 500 mg daily with breakfast.     NON FORMULARY CPAP     pantoprazole (PROTONIX)  40 MG tablet TAKE 1 TABLET EVERY DAY (Patient taking differently: daily.) 90 tablet 3   psyllium (METAMUCIL) 58.6 % packet Take by mouth daily. 1 Tablespoon daily       Objective: Vital signs in last 24 hours: Ht 5\' 7"  (1.702 m)   Wt 124.7 kg   BMI 43.07 kg/m   Intake/Output from previous day: No intake/output data recorded. Intake/Output this shift: @IOTHISSHIFT @   Physical Exam Vitals reviewed.  Constitutional:      Appearance: Normal appearance. He is obese.  Cardiovascular:     Rate and Rhythm: Normal rate and regular rhythm.  Pulmonary:     Effort: Pulmonary effort is normal. No respiratory distress.     Breath sounds: Normal breath sounds.  Neurological:     Mental Status: He is alert.     Recent Results (from the past 2160 hour(s))  Urinalysis, Routine w reflex microscopic     Status: None   Collection Time: 06/29/20  2:46 PM  Result Value Ref Range   Specific Gravity, UA 1.015 1.005 - 1.030   pH, UA 7.5 5.0 - 7.5   Color, UA Yellow Yellow   Appearance Ur Clear Clear   Leukocytes,UA Negative Negative   Protein,UA Negative Negative/Trace   Glucose, UA Negative Negative   Ketones, UA Negative Negative   RBC, UA Negative Negative   Bilirubin, UA Negative Negative   Urobilinogen, Ur 0.2 0.2 - 1.0 mg/dL   Nitrite, UA Negative Negative  Testosterone     Status: Abnormal   Collection Time: 07/27/20  9:29 AM  Result Value Ref Range   Testosterone 22 (L) 264 - 916 ng/dL    Comment: Adult male reference interval is based on a population of healthy nonobese males (BMI <30) between 39 and 61 years old. Jordan [name], Jordan [name] [phone]. PMID: [id number].   PSA     Status: None   Collection Time: 07/27/20  9:29 AM  Result Value Ref Range   Prostate Specific  Ag, Serum 1.2 0.0 - 4.0 ng/mL    Comment: Roche ECLIA methodology. According to the American Urological Association, Serum PSA should decrease and remain at undetectable levels after radical prostatectomy. The AUA defines biochemical recurrence as an initial PSA value 0.2 ng/mL or greater followed by a subsequent confirmatory PSA value 0.2 ng/mL or greater. Values obtained with different assay methods or kits cannot be used interchangeably. Results cannot be interpreted as absolute evidence of the presence or absence of malignant disease.   Urinalysis, Routine w reflex microscopic     Status: Abnormal   Collection Time: 08/03/20  2:12 PM  Result Value Ref Range   Specific Gravity, UA 1.015 1.005 - 1.030   pH, UA 5.5 5.0 - 7.5   Color, UA Yellow Yellow   Appearance Ur Clear Clear   Leukocytes,UA Negative Negative   Protein,UA Negative Negative/Trace   Glucose, UA Negative Negative   Ketones, UA Negative Negative   RBC, UA Trace (A) Negative   Bilirubin, UA Negative Negative   Urobilinogen, Ur 0.2 0.2 - 1.0 mg/dL   Nitrite, UA Negative Negative   Microscopic Examination See below:   Microscopic Examination     Status: None   Collection Time: 08/03/20  2:12 PM   Urine  Result Value Ref Range   WBC, UA None seen 0 - 5 /hpf   RBC 0-2 0 - 2 /hpf   Epithelial Cells (non renal) None seen 0 - 10 /hpf   Renal Epithel, UA None seen None seen /  hpf   Mucus, UA Present Not Estab.   Bacteria, UA None seen None seen/Few  CBC     Status: None   Collection Time: 09/05/20  9:22 AM  Result Value Ref Range   WBC 8.0 4.0 - 10.5 K/uL   RBC 4.85 4.22 - 5.81 MIL/uL   Hemoglobin 14.5 13.0 - 17.0 g/dL   HCT 44.3 39.0 - 52.0 %   MCV 91.3 80.0 - 100.0 fL   MCH 29.9 26.0 - 34.0 pg   MCHC 32.7 30.0 - 36.0 g/dL   RDW 13.7 11.5 - 15.5 %   Platelets 238 150 - 400 K/uL   nRBC 0.0 0.0 - 0.2 %    Comment: Performed at [facility], [place] [address], [place], [place] [address]   Comprehensive metabolic panel     Status: Abnormal   Collection Time: 09/05/20  9:22 AM  Result Value Ref Range   Sodium 141 135 - 145 mmol/L   Potassium 4.1 3.5 - 5.1 mmol/L   Chloride 105 98 - 111 mmol/L   CO2 29 22 - 32 mmol/L   Glucose, Bld 115 (H) 70 - 99 mg/dL    Comment: Glucose reference range applies only to samples taken after fasting for at least 8 hours.   BUN 12 8 - 23 mg/dL   Creatinine, Ser 0.78 0.61 - 1.24 mg/dL   Calcium 9.5 8.9 - 10.3 mg/dL   Total Protein 7.9 6.5 - 8.1 g/dL   Albumin 4.1 3.5 - 5.0 g/dL   AST 19 15 - 41 U/L   ALT 19 0 - 44 U/L   Alkaline Phosphatase 58 38 - 126 U/L   Total Bilirubin 0.6 0.3 - 1.2 mg/dL   GFR, Estimated >60 >60 mL/min    Comment: (NOTE) Calculated using the CKD-EPI Creatinine Equation (2021)    Anion gap 7 5 - 15    Comment: Performed at [facility], [place] [address], [place], [place] [address]  Protime-INR     Status: None   Collection Time: 09/05/20  9:22 AM  Result Value Ref Range   Prothrombin Time 13.2 11.4 - 15.2 seconds   INR 1.0 0.8 - 1.2    Comment: (NOTE) INR goal varies based on device and disease states. Performed at [facility], [place] [address] [place], [place] [address]   APTT     Status: None   Collection Time: 09/05/20  9:22 AM  Result Value Ref Range   aPTT 31 24 - 36 seconds    Comment: Performed at [facility], [place] [address], [place], [place] [address]      Studies/Results: I have reviewed the notes from Dr. Tammi Klippel.     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   He is tolerating firmagon and will continue that monthly for 31mo.   He will get the seed implant on 09/08/20.      No orders of the defined types were placed in this encounter.    No orders of the defined types were placed in this encounter.    No follow-ups on file.    CC: Dr. Iona Beard, Dr. Ledon Snare  and Dr. Shelva Majestic.    Irine Seal 09/08/2020 [phone] ID: Jordan Resides., male   DOB: 26-Jun-1945, 75 y.o.   MRN: [id number]

## 2020-09-08 NOTE — Transfer of Care (Signed)
Immediate Anesthesia Transfer of Care Note  Patient: Jordan [name].  Procedure(s) Performed: Procedure(s) (LRB): RADIOACTIVE SEED IMPLANT/BRACHYTHERAPY IMPLANT (N/A) SPACE OAR INSTILLATION (N/A) CYSTOSCOPY FLEXIBLE  Patient Location: PACU  Anesthesia Type: General  Level of Consciousness: awake, oriented, sedated and patient cooperative  Airway & Oxygen Therapy: Patient Spontanous Breathing and Patient connected to face mask oxygen  Post-op Assessment: Report given to PACU RN and Post -op Vital signs reviewed and stable  Post vital signs: Reviewed and stable  Complications: No apparent anesthesia complications Last Vitals:  Vitals Value Taken Time  BP 146/70 09/08/20 1420  Temp    Pulse 72 09/08/20 1427  Resp 15 09/08/20 1427  SpO2 95 % 09/08/20 1427  Vitals shown include unvalidated device data.  Last Pain:  Vitals:   09/08/20 1020  TempSrc: Oral  PainSc: 0-No pain      Patients Stated Pain Goal: 5 (11/94/17 4081)  Complications: No notable events documented.

## 2020-09-08 NOTE — Discharge Instructions (Signed)
PROSTATE CANCER TREATMENT WITH RADIOACTIVE IODINE-125 SEED IMPLANT  This instruction sheet is intended to discuss implantation of Iodine-125 seeds as treatment for cancer of the prostate. It will explain in detail what you may expect from this treatment and what precautions are necessary as a result of the treatment. Iodine-125 emits a relatively low energy radiation. The radioactive seeds are surgically implanted directly into the prostate gland. Most of the radiation is contained within the prostate gland. A very small amount is present outside the body.The precautions that we ask you to take are to ensure that those around you are protected from unnecessary radiation. The principles of radiation safety that you need to understand are:  DISTANCE: The further a person is from the radioactive implant the less radiation they will be receiving. The amount of radiation received falls off quite rapidly with distance. More specific guidelines are given in the table on the last page.  TIME: The amount of radiation a person is exposed to is directly proportional to the amount of time that is spent in close proximity to the radioactive implant. Very little radiation will be received during short periods. See the table on the last page for more specific guideline.  CHILDREN UNDER AGE 26 Children should not be allowed to sit on your lap or otherwise be in very close contact for more than a few minutes for the first 6-8 weeks following the implant. You may affectionately greet (hug/kiss) a child for a short period of time, but remember, the longer you are in close proximity with that child the more radiation they are being exposed to. At a distance of 6 feet there is no limit to the length of time you may spend together. See specific guidelines on the last page.  PREGNANT OR POSSIBLY PREGNANT WOMEN Pregnant women should avoid prolonged close physical contact with you for the first 6-8 weeks after implant. At a  distance of 6 feet there is no limit to the length of time you may spend together. Pregnant women or possibly pregnant women can safely be in close contact with you for a limited period of time. See the last page for guidelines.  FAMILY RELATIONS You may sleep in the same bed as your partner (provided she is not pregnant or under the age of 60). Sexual intercourse, using a condom, may be resumed 2 weeks after the implant. Your semen may be discolored, dark brown or black. This is normal and is the result of bleeding that may have occurred during the implant. After 3-4 weeks it will not be necessary to use a condom.  DAILY ACTIVITIES You may resume normal activities in a few days (example: work, shopping, church) without the risk of harmful radiation exposure to those around you provided you keep in mind the time and distance precautions. Objects that you touch or item that you use do not become radioactive. Linens, clothing, tableware, and dishes may be used by other persons without special precautions. Your bodily wastes (urine and stool) are not radioactive.  SPECIAL PRECAUTIONS It is possible to lose implanted Iodine-125 seed(s) through urination. Although it is possible to pass seeds indefinitely, it is most likely to occur immediately after catheter removal. To prevent this from happening the catheter that was in place during the implant procedure is removed immediately after the implant and a cystoscopy procedure is performed. The process of removing the catheter and the cystoscopy procedure should dislodge and remove any seeds that are not firmly imbedded in the prostate  tissue. However, you should watch for seeds if/when you remove your catheter at home. The seeds are silver colored and the size of a grain of rice. In the unlikely event that a seed is seen after urination, simply flush the seed down the toilet. The seed should not be handled with your fingers, not even with a glove or napkin. A  spoon or tweezers can be used to pick up a seed. The Radiation Oncology department is open Monday - Friday from 8:00 am to 5:30 pm with a Radiation Oncologist on call at all times. He or she may be reached by calling [phone]. If you are to be hospitalized or if death should occur, your family should notify the Runner, broadcasting/film/video.  SIDE EFFECTS There are very few side effects associate with the implant procedure. Minor burning with urination, weak stream, hesitancy, intermittency, frequency, mild pain or feeling unable to pass your urine freely are common and usually stop in one to four months. If these symptoms are extremely uncomfortable, contact your physician.  RADIATION SAFETY GUIDELINES PROSTATE CANCER TREATMENT WITH RADIOACTIVE IODINE-125 SEED IMPLANT  The following guidelines will limit exposure to less than naturally occurring background radiation.  PERSONS AGE 5-45 (if able to become pregnant)  FOR 8 WEEKS FOLLOWING IMPLANT  At a distance of 1 foot: limit time to less than 2 hours/week At a distance of 3 feet: limit time to 20 hours/week At a distance of 6 feet: no restrictions  AFTER 8 WEEKS No restrictions  CHILDREN UNDER AGE 5, PREGNANT WOMEN OR POSSIBLY PREGNANT WOMEN  FOR 8 WEEKS FOLLOWING IMPLANT At a distance of 1 foot: limit time to 10 minutes/week At a distance of 3 feet: limit time to 2 hours/week At a distance of 6 feet: no restrictions  AFTER 8 WEEKS No restrictions  PERSONS OVER THE AGE OF 45 AND DO NOT EXPECT TO HAVE ANY MORE CHILDREN No restrictions  Updated by [facility] in January 2020   Post Anesthesia Home Care Instructions  Activity: Get plenty of rest for the remainder of the day. A responsible individual must stay with you for 24 hours following the procedure.  For the next 24 hours, DO NOT: -Drive a car -Paediatric nurse -Drink alcoholic beverages -Take any medication unless instructed by your physician -Make any legal decisions  or sign important papers.  Meals: Start with liquid foods such as gelatin or soup. Progress to regular foods as tolerated. Avoid greasy, spicy, heavy foods. If nausea and/or vomiting occur, drink only clear liquids until the nausea and/or vomiting subsides. Call your physician if vomiting continues.  Special Instructions/Symptoms: Your throat may feel dry or sore from the anesthesia or the breathing tube placed in your throat during surgery. If this causes discomfort, gargle with warm salt water. The discomfort should disappear within 24 hours.

## 2020-09-08 NOTE — Anesthesia Postprocedure Evaluation (Signed)
Anesthesia Post Note  Patient: Jordan [name].  Procedure(s) Performed: RADIOACTIVE SEED IMPLANT/BRACHYTHERAPY IMPLANT SPACE OAR INSTILLATION (Rectum) CYSTOSCOPY FLEXIBLE (Bladder)     Patient location during evaluation: PACU Anesthesia Type: General Level of consciousness: awake and alert and oriented Pain management: pain level controlled Vital Signs Assessment: post-procedure vital signs reviewed and stable Respiratory status: spontaneous breathing, nonlabored ventilation and respiratory function stable Cardiovascular status: blood pressure returned to baseline and stable Postop Assessment: no apparent nausea or vomiting Anesthetic complications: no   No notable events documented.  Last Vitals:  Vitals:   09/08/20 1445 09/08/20 1530  BP: 126/66 128/68  Pulse: 64 62  Resp: 12 16  Temp: (!) 36.3 C 36.6 C  SpO2: 96% 98%    Last Pain:  Vitals:   09/08/20 1530  TempSrc:   PainSc: 0-No pain                 Mate Alegria A.

## 2020-09-08 NOTE — Op Note (Signed)
PATIENT:  Jordan [name].  PRE-OPERATIVE DIAGNOSIS:  Adenocarcinoma of the prostate  POST-OPERATIVE DIAGNOSIS:  Same  PROCEDURE:  Procedure(s): 1. I-125 radioactive seed implantation 2. SpaceOAR implantation. 3.  Cystoscopy  SURGEON:  Surgeon(s): Irine Seal MD  Radiation oncologist: Dr. Tyler Pita  ANESTHESIA:  General  EBL:  Minimal  DRAINS: 36 French Foley catheter  INDICATION: Jordan [name]. is a 75 y.o. with Stage T2b N0 M0, Gleason 8 prostate cancer who has elected brachytherapy with EXRT and ADT for treatment.  Description of procedure: After informed consent the patient was brought to the major OR, placed on the table and administered general anesthesia. He was then moved to the modified lithotomy position with his perineum perpendicular to the floor. His perineum and genitalia were then sterilely prepped. An official timeout was then performed. A 16 French Foley catheter was then placed in the bladder and filled with dilute contrast, a rectal tube was placed in the rectum and the transrectal ultrasound probe was placed in the rectum and affixed to the stand. He was then sterilely draped.  The sterile grid was installed.   Anchor needles were then placed.   Real time ultrasonography was used along with the seed planning software spot-pro version 3.1-00. This was used to develop the seed plan including the number of needles as well as number of seeds required for complete and adequate coverage. Real-time ultrasonography was then used along with the previously developed plan  to implant a total of 51 seeds using 18 needles for a target dose of 145 Gy. This proceeded without difficulty or complication.  The anchor needles and guide were removed and the SpaceOAR needle was passed under US guidance into the fat stripe posterior to the prostate with the tip in the midline at mid prostate. A puff of NS confirmed appropriate positioning and the SpaceOAR view polymer was then injected  over 12 seconds into the space with excellent distribution.     A Foley catheter was then removed as well as the transrectal ultrasound probe and rectal probe. Flexible cystoscopy was then performed using the 17 French flexible scope which revealed a normal urethra throughout its length down to the sphincter which appeared intact. The prostatic urethra was 2cm with bilobar hyperplasia. The bladder was then entered and fully and systematically inspected.  The ureteral orifices were noted to be of normal configuration and position. The mucosa revealed no evidence of tumors. There were also no stones identified within the bladder.  no seeds or spacers were seen and/or removed from the bladder.  The cystoscope was then removed.  The drapes were removed.  The perineum was cleaned and dressed.  He was taken out of the lithotomy position and was awakened and taken to recovery room in stable and satisfactory condition. He tolerated procedure well and there were no intraoperative complications.

## 2020-09-08 NOTE — Anesthesia Procedure Notes (Signed)
Procedure Name: Intubation Date/Time: 09/08/2020 1:01 PM Performed by: Suan Halter, CRNA Pre-anesthesia Checklist: Patient identified, Emergency Drugs available, Suction available and Patient being monitored Patient Re-evaluated:Patient Re-evaluated prior to induction Oxygen Delivery Method: Circle system utilized Preoxygenation: Pre-oxygenation with 100% oxygen Induction Type: IV induction Ventilation: Mask ventilation without difficulty Laryngoscope Size: Mac and 4 Grade View: Grade I Tube type: Oral Tube size: 7.5 mm Number of attempts: 1 Airway Equipment and Method: Stylet and Oral airway Placement Confirmation: ETT inserted through vocal cords under direct vision, positive ETCO2 and breath sounds checked- equal and bilateral Secured at: 23 cm Tube secured with: Tape Dental Injury: Teeth and Oropharynx as per pre-operative assessment  Comments: Difficult positioning and poor seal with both LMA #5 and LMA #5 Supreme.  Converted to GETA with easy  grade 1 intubation

## 2020-09-08 NOTE — Interval H&P Note (Signed)
History and Physical Interval Note:  he has no additional questions and the preop labs are ok.   09/08/2020 12:26 PM  Jordan [name].  has presented today for surgery, with the diagnosis of prostate cancer.  The various methods of treatment have been discussed with the patient and family. After consideration of risks, benefits and other options for treatment, the patient has consented to  Procedure(s) with comments: RADIOACTIVE SEED IMPLANT/BRACHYTHERAPY IMPLANT (N/A) -   SEEDS IMPLANTED SPACE OAR INSTILLATION (N/A) CYSTOSCOPY FLEXIBLE - NO SEEDS FOUND IN BLADDER as a surgical intervention.  The patient's history has been reviewed, patient examined, no change in status, stable for surgery.  I have reviewed the patient's chart and labs.  Questions were answered to the patient's satisfaction.     Irine Seal

## 2020-09-11 ENCOUNTER — Encounter (HOSPITAL_BASED_OUTPATIENT_CLINIC_OR_DEPARTMENT_OTHER): Payer: Self-pay | Admitting: Urology

## 2020-09-11 NOTE — Progress Notes (Signed)
  Radiation Oncology         [phone]) [phone] ________________________________  Name: Zenia Resides. MRN: [id number]  Date: 09/11/2020  DOB: 1945/08/02       Prostate Seed Implant  DZ:HGDJ, Berneta Sages, MD  No ref. provider found  DIAGNOSIS:  Oncology History  Malignant neoplasm of prostate ([facility])  05/11/2020 Cancer Staging   Staging form: Prostate, AJCC 8th Edition - Clinical stage from 05/11/2020: Stage IIC (cT2b, cN0, cM0, PSA: 3.6, Grade Group: 4) - Signed by Freeman Caldron, PA-C on 06/21/2020  Histopathologic type: Adenocarcinoma, NOS  Stage prefix: Initial diagnosis  Prostate specific antigen (PSA) range: Less than 10  Gleason primary pattern: 4  Gleason secondary pattern: 4  Gleason score: 8  Histologic grading system: 5 grade system  Number of biopsy cores examined: 12  Number of biopsy cores positive: 6  Location of positive needle core biopsies: One side    06/21/2020 Initial Diagnosis   Malignant neoplasm of prostate ([facility])      No diagnosis found.  PROCEDURE: Insertion of radioactive I-125 seeds into the prostate gland.  RADIATION DOSE: 110 Gy, boost therapy.  TECHNIQUE: Bricyn Labrada. was brought to the operating room with the urologist. He was placed in the dorsolithotomy position. He was catheterized and a rectal tube was inserted. The perineum was shaved, prepped and draped. The ultrasound probe was then introduced into the rectum to see the prostate gland.  TREATMENT DEVICE: A needle grid was attached to the ultrasound probe stand and anchor needles were placed.  3D PLANNING: The prostate was imaged in 3D using a sagittal sweep of the prostate probe. These images were transferred to the planning computer. There, the prostate, urethra and rectum were defined on each axial reconstructed image. Then, the software created an optimized 3D plan and a few seed positions were adjusted. The quality of the plan was reviewed using [facility] information for the target and the  following two organs at risk:  Urethra and Rectum.  Then the accepted plan was printed and handed off to the radiation therapist.  Under my supervision, the custom loading of the seeds and spacers was carried out and loaded into sealed vicryl sleeves.  These pre-loaded needles were then placed into the needle holder.Marland Kitchen  PROSTATE VOLUME STUDY:  Using transrectal ultrasound the volume of the prostate was verified to be 22.9 cc.  SPECIAL TREATMENT PROCEDURE/SUPERVISION AND HANDLING: The pre-loaded needles were then delivered under sagittal guidance. A total of 18 needles were used to deposit 51 seeds in the prostate gland. The individual seed activity was 0.322 mCi.  SpaceOAR:  Yes  COMPLEX SIMULATION: At the end of the procedure, an anterior radiograph of the pelvis was obtained to document seed positioning and count. Cystoscopy was performed to check the urethra and bladder.  MICRODOSIMETRY: At the end of the procedure, the patient was emitting 0.033 mR/hr at 1 meter. Accordingly, he was considered safe for hospital discharge.  PLAN: The patient will return to the radiation oncology clinic for post implant CT dosimetry in three weeks.   ________________________________  Sheral Apley Tammi Klippel, M.D.

## 2020-09-14 ENCOUNTER — Ambulatory Visit: Payer: Medicare HMO | Admitting: Adult Health

## 2020-09-21 ENCOUNTER — Telehealth: Payer: Self-pay | Admitting: *Deleted

## 2020-09-21 NOTE — Telephone Encounter (Signed)
CALLED PATIENT TO REMIND OF SIM APPT. FOR 09-22-20- ARRIVAL TIME- 2:15 PM, SPOKE WITH PATIENT AND HE IS AWARE OF THIS APPT.

## 2020-09-22 ENCOUNTER — Ambulatory Visit
Admission: RE | Admit: 2020-09-22 | Discharge: 2020-09-22 | Disposition: A | Discharge status: Home / Self Care | Payer: Medicare HMO | Source: Ambulatory Visit | Attending: Radiation Oncology | Admitting: Radiation Oncology

## 2020-09-22 ENCOUNTER — Other Ambulatory Visit: Payer: Self-pay

## 2020-09-22 DIAGNOSIS — C61 Malignant neoplasm of prostate: Secondary | ICD-10-CM | POA: Diagnosis not present

## 2020-09-22 NOTE — Progress Notes (Signed)
  Radiation Oncology         [phone]) [phone] ________________________________  Name: Jordan [name]. MRN: [id number]  Date: 09/22/2020  DOB: 1945/07/01  COMPLEX SIMULATION NOTE  NARRATIVE:  The patient was brought to the [facility] today following prostate seed implantation approximately one month ago.  Identity was confirmed.  All relevant records and images related to the planned course of therapy were reviewed.  Then, the patient was set-up supine.  CT images were obtained.  The CT images were loaded into the planning software.  Then the prostate and rectum were contoured.  Treatment planning then occurred.  The implanted iodine 125 seeds were identified by the physics staff for projection of radiation distribution  I have requested : 3D Simulation  I have requested a DVH of the following structures: Prostate and rectum.    ________________________________  Sheral Apley Tammi Klippel, M.D.

## 2020-09-22 NOTE — Progress Notes (Signed)
  Radiation Oncology         [phone]) [phone] ________________________________  Name: Jordan [name]. MRN: [id number]  Date: 09/22/2020  DOB: 06-Apr-1945  SIMULATION AND TREATMENT PLANNING NOTE    ICD-10-CM   1. Malignant neoplasm of prostate ([facility])  C61       DIAGNOSIS:  75 y.o. 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  NARRATIVE:  The patient was brought to the [facility].  Identity was confirmed.  All relevant records and images related to the planned course of therapy were reviewed.  The patient freely provided informed written consent to proceed with treatment after reviewing the details related to the planned course of therapy. The consent form was witnessed and verified by the simulation staff.  Then, the patient was set-up in a stable reproducible supine position for radiation therapy.  A vacuum lock pillow device was custom fabricated to position his legs in a reproducible immobilized position.  Then, I performed a urethrogram under sterile conditions to identify the prostatic apex.  CT images were obtained.  Surface markings were placed.  The CT images were loaded into the planning software.  Then the prostate target and avoidance structures including the rectum, bladder, bowel and hips were contoured.  Treatment planning then occurred.  The radiation prescription was entered and confirmed.  A total of one complex treatment devices were fabricated. I have requested : Intensity Modulated Radiotherapy (IMRT) is medically necessary for this case for the following reason:  Rectal sparing.Marland Kitchen  PLAN:  The patient will receive 45 Gy in 25 fractions of 1.8 Gy, to supplement an up-front prostate seed implant boost of 110 Gy to achieve a total nominal dose of 155 Gy.  ________________________________  Sheral Apley Tammi Klippel, M.D.

## 2020-09-28 ENCOUNTER — Other Ambulatory Visit: Payer: Self-pay

## 2020-09-28 ENCOUNTER — Ambulatory Visit (INDEPENDENT_AMBULATORY_CARE_PROVIDER_SITE_OTHER): Payer: Medicare HMO

## 2020-09-28 DIAGNOSIS — Z510 Encounter for antineoplastic radiation therapy: Secondary | ICD-10-CM | POA: Insufficient documentation

## 2020-09-28 DIAGNOSIS — C61 Malignant neoplasm of prostate: Secondary | ICD-10-CM | POA: Diagnosis not present

## 2020-09-28 MED ORDER — DEGARELIX ACETATE 80 MG ~~LOC~~ SOLR
80.0000 mg | Freq: Once | SUBCUTANEOUS | Status: AC
  Administered 2020-09-28: 80 mg via SUBCUTANEOUS

## 2020-09-28 NOTE — Progress Notes (Signed)
Firmagon Sub Q Injection  Due to Prostate Cancer patient is present today for a Firmagon Injection.   Medication: Mills Koller (Degarelix)  Dose: '80mg'$  Location: right upper abdomen Lot: [id number] Exp: 06/2022  Patient tolerated well, no complications were noted  Performed by: Tane Biegler LPN  Follow up: Keep next schedule [place]

## 2020-10-03 ENCOUNTER — Ambulatory Visit
Admission: RE | Admit: 2020-10-03 | Discharge: 2020-10-03 | Disposition: A | Discharge status: Home / Self Care | Payer: Medicare HMO | Source: Ambulatory Visit | Attending: Radiation Oncology | Admitting: Radiation Oncology

## 2020-10-03 ENCOUNTER — Other Ambulatory Visit: Payer: Self-pay

## 2020-10-03 DIAGNOSIS — C61 Malignant neoplasm of prostate: Secondary | ICD-10-CM | POA: Diagnosis not present

## 2020-10-03 DIAGNOSIS — Z510 Encounter for antineoplastic radiation therapy: Secondary | ICD-10-CM | POA: Diagnosis not present

## 2020-10-04 ENCOUNTER — Other Ambulatory Visit: Payer: Self-pay

## 2020-10-04 ENCOUNTER — Ambulatory Visit
Admission: RE | Admit: 2020-10-04 | Discharge: 2020-10-04 | Disposition: A | Discharge status: Home / Self Care | Payer: Medicare HMO | Source: Ambulatory Visit | Attending: Radiation Oncology | Admitting: Radiation Oncology

## 2020-10-04 DIAGNOSIS — C61 Malignant neoplasm of prostate: Secondary | ICD-10-CM | POA: Diagnosis not present

## 2020-10-04 DIAGNOSIS — Z510 Encounter for antineoplastic radiation therapy: Secondary | ICD-10-CM | POA: Diagnosis not present

## 2020-10-05 ENCOUNTER — Other Ambulatory Visit: Payer: Self-pay

## 2020-10-05 ENCOUNTER — Ambulatory Visit
Admission: RE | Admit: 2020-10-05 | Discharge: 2020-10-05 | Disposition: A | Discharge status: Home / Self Care | Payer: Medicare HMO | Source: Ambulatory Visit | Attending: Radiation Oncology | Admitting: Radiation Oncology

## 2020-10-05 DIAGNOSIS — C61 Malignant neoplasm of prostate: Secondary | ICD-10-CM | POA: Diagnosis not present

## 2020-10-05 DIAGNOSIS — Z510 Encounter for antineoplastic radiation therapy: Secondary | ICD-10-CM | POA: Diagnosis not present

## 2020-10-06 ENCOUNTER — Ambulatory Visit
Admission: RE | Admit: 2020-10-06 | Discharge: 2020-10-06 | Disposition: A | Discharge status: Home / Self Care | Payer: Medicare HMO | Source: Ambulatory Visit | Attending: Radiation Oncology | Admitting: Radiation Oncology

## 2020-10-06 DIAGNOSIS — Z510 Encounter for antineoplastic radiation therapy: Secondary | ICD-10-CM | POA: Diagnosis not present

## 2020-10-06 DIAGNOSIS — C61 Malignant neoplasm of prostate: Secondary | ICD-10-CM | POA: Diagnosis not present

## 2020-10-09 ENCOUNTER — Ambulatory Visit
Admission: RE | Admit: 2020-10-09 | Discharge: 2020-10-09 | Disposition: A | Discharge status: Home / Self Care | Payer: Medicare HMO | Source: Ambulatory Visit | Attending: Radiation Oncology | Admitting: Radiation Oncology

## 2020-10-09 ENCOUNTER — Encounter: Payer: Self-pay | Admitting: Radiation Oncology

## 2020-10-09 ENCOUNTER — Other Ambulatory Visit: Payer: Self-pay

## 2020-10-09 DIAGNOSIS — Z510 Encounter for antineoplastic radiation therapy: Secondary | ICD-10-CM | POA: Diagnosis not present

## 2020-10-09 DIAGNOSIS — C61 Malignant neoplasm of prostate: Secondary | ICD-10-CM | POA: Diagnosis not present

## 2020-10-10 ENCOUNTER — Ambulatory Visit
Admission: RE | Admit: 2020-10-10 | Discharge: 2020-10-10 | Disposition: A | Discharge status: Home / Self Care | Payer: Medicare HMO | Source: Ambulatory Visit | Attending: Radiation Oncology | Admitting: Radiation Oncology

## 2020-10-10 DIAGNOSIS — C61 Malignant neoplasm of prostate: Secondary | ICD-10-CM | POA: Diagnosis not present

## 2020-10-10 DIAGNOSIS — Z510 Encounter for antineoplastic radiation therapy: Secondary | ICD-10-CM | POA: Diagnosis not present

## 2020-10-11 ENCOUNTER — Ambulatory Visit
Admission: RE | Admit: 2020-10-11 | Discharge: 2020-10-11 | Disposition: A | Discharge status: Home / Self Care | Payer: Medicare HMO | Source: Ambulatory Visit | Attending: Radiation Oncology | Admitting: Radiation Oncology

## 2020-10-11 ENCOUNTER — Other Ambulatory Visit: Payer: Self-pay

## 2020-10-11 DIAGNOSIS — Z510 Encounter for antineoplastic radiation therapy: Secondary | ICD-10-CM | POA: Diagnosis not present

## 2020-10-11 DIAGNOSIS — C61 Malignant neoplasm of prostate: Secondary | ICD-10-CM | POA: Diagnosis not present

## 2020-10-12 ENCOUNTER — Ambulatory Visit
Admission: RE | Admit: 2020-10-12 | Discharge: 2020-10-12 | Disposition: A | Discharge status: Home / Self Care | Payer: Medicare HMO | Source: Ambulatory Visit | Attending: Radiation Oncology | Admitting: Radiation Oncology

## 2020-10-12 DIAGNOSIS — C61 Malignant neoplasm of prostate: Secondary | ICD-10-CM | POA: Diagnosis not present

## 2020-10-12 DIAGNOSIS — Z510 Encounter for antineoplastic radiation therapy: Secondary | ICD-10-CM | POA: Diagnosis not present

## 2020-10-13 ENCOUNTER — Ambulatory Visit
Admission: RE | Admit: 2020-10-13 | Discharge: 2020-10-13 | Disposition: A | Discharge status: Home / Self Care | Payer: Medicare HMO | Source: Ambulatory Visit | Attending: Radiation Oncology | Admitting: Radiation Oncology

## 2020-10-13 ENCOUNTER — Other Ambulatory Visit: Payer: Self-pay | Admitting: Radiation Oncology

## 2020-10-13 ENCOUNTER — Other Ambulatory Visit: Payer: Self-pay

## 2020-10-13 DIAGNOSIS — Z510 Encounter for antineoplastic radiation therapy: Secondary | ICD-10-CM | POA: Diagnosis not present

## 2020-10-13 DIAGNOSIS — C61 Malignant neoplasm of prostate: Secondary | ICD-10-CM | POA: Diagnosis not present

## 2020-10-13 MED ORDER — TAMSULOSIN HCL 0.4 MG PO CAPS: 0.4000 mg | ORAL_CAPSULE | Freq: Every day | ORAL | 5 refills | Status: DC

## 2020-10-16 ENCOUNTER — Ambulatory Visit
Admission: RE | Admit: 2020-10-16 | Discharge: 2020-10-16 | Disposition: A | Discharge status: Home / Self Care | Payer: Medicare HMO | Source: Ambulatory Visit | Attending: Radiation Oncology | Admitting: Radiation Oncology

## 2020-10-16 ENCOUNTER — Other Ambulatory Visit: Payer: Self-pay

## 2020-10-16 DIAGNOSIS — Z510 Encounter for antineoplastic radiation therapy: Secondary | ICD-10-CM | POA: Diagnosis not present

## 2020-10-16 DIAGNOSIS — C61 Malignant neoplasm of prostate: Secondary | ICD-10-CM | POA: Diagnosis not present

## 2020-10-16 NOTE — Progress Notes (Signed)
  Radiation Oncology         [phone]) [phone] ________________________________  Name: Jordan [name]. MRN: [id number]  Date: 10/09/2020  DOB: [birth year]  3D Planning Note   Prostate Brachytherapy Post-Implant Dosimetry  Diagnosis: 75 y.o. 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  Narrative: On a previous date, Jordan [name]. returned following prostate seed implantation for post implant planning. He underwent CT scan complex simulation to delineate the three-dimensional structures of the pelvis and demonstrate the radiation distribution.  Since that time, the seed localization, and complex isodose planning with dose volume histograms have now been completed.  Results:   Prostate Coverage - The dose of radiation delivered to the 90% or more of the prostate gland (D90) was 98.23% of the prescription dose. This exceeds our goal of greater than 90%. Rectal Sparing - The volume of rectal tissue receiving the prescription dose or higher was 0.0 cc. This falls under our thresholds tolerance of 1.0 cc.  Impression: The prostate seed implant appears to show adequate target coverage and appropriate rectal sparing.  Plan:  The patient will continue to follow with urology for ongoing PSA determinations. I would anticipate a high likelihood for local tumor control with minimal risk for rectal morbidity.  ________________________________  Sheral Apley Tammi Klippel, M.D.

## 2020-10-17 ENCOUNTER — Ambulatory Visit
Admission: RE | Admit: 2020-10-17 | Discharge: 2020-10-17 | Disposition: A | Discharge status: Home / Self Care | Payer: Medicare HMO | Source: Ambulatory Visit | Attending: Radiation Oncology | Admitting: Radiation Oncology

## 2020-10-17 ENCOUNTER — Telehealth: Payer: Self-pay | Admitting: Podiatry

## 2020-10-17 DIAGNOSIS — C61 Malignant neoplasm of prostate: Secondary | ICD-10-CM | POA: Diagnosis not present

## 2020-10-17 DIAGNOSIS — Z510 Encounter for antineoplastic radiation therapy: Secondary | ICD-10-CM | POA: Diagnosis not present

## 2020-10-17 NOTE — Telephone Encounter (Signed)
Called pt about his diabetic inserts we are trying to get him. We did receive documents from Dr Berdine Addison but it states last office visit was dec 2021 and it has to be within 6 months. Pt thinks he was seen in the spring but is going to call Dr Berdine Addison to see if it is correct. I told pt to please call me if I need to resend the documents.  I also changed his appt from 9.12 to 9.30 as pt is undergoing radiation and had an appt on the same day and time for radiation and routine foot care.

## 2020-10-18 ENCOUNTER — Ambulatory Visit
Admission: RE | Admit: 2020-10-18 | Discharge: 2020-10-18 | Disposition: A | Discharge status: Home / Self Care | Payer: Medicare HMO | Source: Ambulatory Visit | Attending: Radiation Oncology | Admitting: Radiation Oncology

## 2020-10-18 ENCOUNTER — Other Ambulatory Visit: Payer: Self-pay

## 2020-10-18 DIAGNOSIS — C61 Malignant neoplasm of prostate: Secondary | ICD-10-CM | POA: Diagnosis not present

## 2020-10-18 DIAGNOSIS — Z510 Encounter for antineoplastic radiation therapy: Secondary | ICD-10-CM | POA: Diagnosis not present

## 2020-10-19 ENCOUNTER — Ambulatory Visit
Admission: RE | Admit: 2020-10-19 | Discharge: 2020-10-19 | Disposition: A | Discharge status: Home / Self Care | Payer: Medicare HMO | Source: Ambulatory Visit | Attending: Radiation Oncology | Admitting: Radiation Oncology

## 2020-10-19 DIAGNOSIS — C61 Malignant neoplasm of prostate: Secondary | ICD-10-CM | POA: Diagnosis not present

## 2020-10-19 DIAGNOSIS — Z510 Encounter for antineoplastic radiation therapy: Secondary | ICD-10-CM | POA: Diagnosis not present

## 2020-10-20 ENCOUNTER — Ambulatory Visit
Admission: RE | Admit: 2020-10-20 | Discharge: 2020-10-20 | Disposition: A | Discharge status: Home / Self Care | Payer: Medicare HMO | Source: Ambulatory Visit | Attending: Radiation Oncology | Admitting: Radiation Oncology

## 2020-10-20 ENCOUNTER — Other Ambulatory Visit: Payer: Self-pay

## 2020-10-20 DIAGNOSIS — C61 Malignant neoplasm of prostate: Secondary | ICD-10-CM | POA: Diagnosis not present

## 2020-10-20 DIAGNOSIS — Z510 Encounter for antineoplastic radiation therapy: Secondary | ICD-10-CM | POA: Diagnosis not present

## 2020-10-23 ENCOUNTER — Other Ambulatory Visit: Payer: Self-pay

## 2020-10-23 ENCOUNTER — Ambulatory Visit
Admission: RE | Admit: 2020-10-23 | Discharge: 2020-10-23 | Disposition: A | Discharge status: Home / Self Care | Payer: Medicare HMO | Source: Ambulatory Visit | Attending: Radiation Oncology | Admitting: Radiation Oncology

## 2020-10-23 DIAGNOSIS — C61 Malignant neoplasm of prostate: Secondary | ICD-10-CM | POA: Diagnosis not present

## 2020-10-23 DIAGNOSIS — Z510 Encounter for antineoplastic radiation therapy: Secondary | ICD-10-CM | POA: Diagnosis not present

## 2020-10-24 ENCOUNTER — Ambulatory Visit
Admission: RE | Admit: 2020-10-24 | Discharge: 2020-10-24 | Disposition: A | Discharge status: Home / Self Care | Payer: Medicare HMO | Source: Ambulatory Visit | Attending: Radiation Oncology | Admitting: Radiation Oncology

## 2020-10-24 DIAGNOSIS — Z510 Encounter for antineoplastic radiation therapy: Secondary | ICD-10-CM | POA: Diagnosis not present

## 2020-10-24 DIAGNOSIS — C61 Malignant neoplasm of prostate: Secondary | ICD-10-CM | POA: Diagnosis not present

## 2020-10-25 ENCOUNTER — Ambulatory Visit
Admission: RE | Admit: 2020-10-25 | Discharge: 2020-10-25 | Disposition: A | Discharge status: Home / Self Care | Payer: Medicare HMO | Source: Ambulatory Visit | Attending: Radiation Oncology | Admitting: Radiation Oncology

## 2020-10-25 ENCOUNTER — Other Ambulatory Visit: Payer: Self-pay

## 2020-10-25 DIAGNOSIS — E1142 Type 2 diabetes mellitus with diabetic polyneuropathy: Secondary | ICD-10-CM | POA: Diagnosis not present

## 2020-10-25 DIAGNOSIS — C61 Malignant neoplasm of prostate: Secondary | ICD-10-CM | POA: Diagnosis not present

## 2020-10-25 DIAGNOSIS — M1712 Unilateral primary osteoarthritis, left knee: Secondary | ICD-10-CM | POA: Diagnosis not present

## 2020-10-25 DIAGNOSIS — E785 Hyperlipidemia, unspecified: Secondary | ICD-10-CM | POA: Diagnosis not present

## 2020-10-25 DIAGNOSIS — Z510 Encounter for antineoplastic radiation therapy: Secondary | ICD-10-CM | POA: Diagnosis not present

## 2020-10-25 DIAGNOSIS — I10 Essential (primary) hypertension: Secondary | ICD-10-CM | POA: Diagnosis not present

## 2020-10-26 ENCOUNTER — Other Ambulatory Visit: Payer: Medicare HMO

## 2020-10-26 ENCOUNTER — Telehealth: Payer: Self-pay | Admitting: Podiatry

## 2020-10-26 ENCOUNTER — Ambulatory Visit
Admission: RE | Admit: 2020-10-26 | Discharge: 2020-10-26 | Disposition: A | Discharge status: Home / Self Care | Payer: Medicare HMO | Source: Ambulatory Visit | Attending: Radiation Oncology | Admitting: Radiation Oncology

## 2020-10-26 DIAGNOSIS — Z510 Encounter for antineoplastic radiation therapy: Secondary | ICD-10-CM | POA: Insufficient documentation

## 2020-10-26 DIAGNOSIS — C61 Malignant neoplasm of prostate: Secondary | ICD-10-CM | POA: Diagnosis not present

## 2020-10-26 NOTE — Telephone Encounter (Signed)
Called pt to discuss the forms we received from pcp for the diabetic shoes and pt was not home so I discussed with pts wife. We did get the documents but it states last office visit was 12.2021 and per medicare it has to be within 6 months. Pts wife stated he has an appt soon and I asked if I could mail her the documents and they can take to the appt with them for the doctor to sign off. She said that would be ok and I told her I would mail them today and verified the address.

## 2020-10-27 ENCOUNTER — Ambulatory Visit
Admission: RE | Admit: 2020-10-27 | Discharge: 2020-10-27 | Disposition: A | Discharge status: Home / Self Care | Payer: Medicare HMO | Source: Ambulatory Visit | Attending: Radiation Oncology | Admitting: Radiation Oncology

## 2020-10-27 DIAGNOSIS — Z510 Encounter for antineoplastic radiation therapy: Secondary | ICD-10-CM | POA: Diagnosis not present

## 2020-10-27 DIAGNOSIS — C61 Malignant neoplasm of prostate: Secondary | ICD-10-CM | POA: Diagnosis not present

## 2020-10-27 LAB — PSA: Prostate Specific Ag, Serum: 1.3 ng/mL (ref 0.0–4.0)

## 2020-10-31 ENCOUNTER — Other Ambulatory Visit: Payer: Self-pay

## 2020-10-31 ENCOUNTER — Ambulatory Visit
Admission: RE | Admit: 2020-10-31 | Discharge: 2020-10-31 | Disposition: A | Discharge status: Home / Self Care | Payer: Medicare HMO | Source: Ambulatory Visit | Attending: Radiation Oncology | Admitting: Radiation Oncology

## 2020-10-31 DIAGNOSIS — C61 Malignant neoplasm of prostate: Secondary | ICD-10-CM | POA: Diagnosis not present

## 2020-10-31 DIAGNOSIS — Z510 Encounter for antineoplastic radiation therapy: Secondary | ICD-10-CM | POA: Diagnosis not present

## 2020-11-01 ENCOUNTER — Ambulatory Visit
Admission: RE | Admit: 2020-11-01 | Discharge: 2020-11-01 | Disposition: A | Discharge status: Home / Self Care | Payer: Medicare HMO | Source: Ambulatory Visit | Attending: Radiation Oncology | Admitting: Radiation Oncology

## 2020-11-01 DIAGNOSIS — C61 Malignant neoplasm of prostate: Secondary | ICD-10-CM | POA: Diagnosis not present

## 2020-11-01 DIAGNOSIS — Z510 Encounter for antineoplastic radiation therapy: Secondary | ICD-10-CM | POA: Diagnosis not present

## 2020-11-02 ENCOUNTER — Other Ambulatory Visit: Payer: Self-pay

## 2020-11-02 ENCOUNTER — Ambulatory Visit
Admission: RE | Admit: 2020-11-02 | Discharge: 2020-11-02 | Disposition: A | Discharge status: Home / Self Care | Payer: Medicare HMO | Source: Ambulatory Visit | Attending: Radiation Oncology | Admitting: Radiation Oncology

## 2020-11-02 ENCOUNTER — Ambulatory Visit: Payer: Medicare HMO | Admitting: Urology

## 2020-11-02 DIAGNOSIS — C61 Malignant neoplasm of prostate: Secondary | ICD-10-CM | POA: Diagnosis not present

## 2020-11-02 DIAGNOSIS — Z510 Encounter for antineoplastic radiation therapy: Secondary | ICD-10-CM | POA: Diagnosis not present

## 2020-11-02 NOTE — Progress Notes (Signed)
Sent via mychart

## 2020-11-03 ENCOUNTER — Ambulatory Visit
Admission: RE | Admit: 2020-11-03 | Discharge: 2020-11-03 | Disposition: A | Discharge status: Home / Self Care | Payer: Medicare HMO | Source: Ambulatory Visit | Attending: Radiation Oncology | Admitting: Radiation Oncology

## 2020-11-03 DIAGNOSIS — Z510 Encounter for antineoplastic radiation therapy: Secondary | ICD-10-CM | POA: Diagnosis not present

## 2020-11-03 DIAGNOSIS — C61 Malignant neoplasm of prostate: Secondary | ICD-10-CM | POA: Diagnosis not present

## 2020-11-06 ENCOUNTER — Ambulatory Visit: Payer: Medicare HMO | Admitting: Podiatry

## 2020-11-06 ENCOUNTER — Other Ambulatory Visit: Payer: Self-pay

## 2020-11-06 ENCOUNTER — Ambulatory Visit
Admission: RE | Admit: 2020-11-06 | Discharge: 2020-11-06 | Disposition: A | Discharge status: Home / Self Care | Payer: Medicare HMO | Source: Ambulatory Visit | Attending: Radiation Oncology | Admitting: Radiation Oncology

## 2020-11-06 DIAGNOSIS — Z510 Encounter for antineoplastic radiation therapy: Secondary | ICD-10-CM | POA: Diagnosis not present

## 2020-11-06 DIAGNOSIS — C61 Malignant neoplasm of prostate: Secondary | ICD-10-CM | POA: Diagnosis not present

## 2020-11-07 ENCOUNTER — Encounter: Payer: Self-pay | Admitting: Radiation Oncology

## 2020-11-07 ENCOUNTER — Ambulatory Visit
Admission: RE | Admit: 2020-11-07 | Discharge: 2020-11-07 | Disposition: A | Discharge status: Home / Self Care | Payer: Medicare HMO | Source: Ambulatory Visit | Attending: Radiation Oncology | Admitting: Radiation Oncology

## 2020-11-07 DIAGNOSIS — C61 Malignant neoplasm of prostate: Secondary | ICD-10-CM | POA: Diagnosis not present

## 2020-11-07 DIAGNOSIS — Z510 Encounter for antineoplastic radiation therapy: Secondary | ICD-10-CM | POA: Diagnosis not present

## 2020-11-14 DIAGNOSIS — I10 Essential (primary) hypertension: Secondary | ICD-10-CM | POA: Diagnosis not present

## 2020-11-14 DIAGNOSIS — G4733 Obstructive sleep apnea (adult) (pediatric): Secondary | ICD-10-CM | POA: Diagnosis not present

## 2020-11-14 DIAGNOSIS — Z0001 Encounter for general adult medical examination with abnormal findings: Secondary | ICD-10-CM | POA: Diagnosis not present

## 2020-11-14 DIAGNOSIS — M150 Primary generalized (osteo)arthritis: Secondary | ICD-10-CM | POA: Diagnosis not present

## 2020-11-14 DIAGNOSIS — C61 Malignant neoplasm of prostate: Secondary | ICD-10-CM | POA: Diagnosis not present

## 2020-11-14 DIAGNOSIS — E1169 Type 2 diabetes mellitus with other specified complication: Secondary | ICD-10-CM | POA: Diagnosis not present

## 2020-11-14 DIAGNOSIS — M67441 Ganglion, right hand: Secondary | ICD-10-CM | POA: Diagnosis not present

## 2020-11-14 DIAGNOSIS — L84 Corns and callosities: Secondary | ICD-10-CM | POA: Diagnosis not present

## 2020-11-14 DIAGNOSIS — M2042 Other hammer toe(s) (acquired), left foot: Secondary | ICD-10-CM | POA: Diagnosis not present

## 2020-11-21 ENCOUNTER — Telehealth: Payer: Self-pay | Admitting: Podiatry

## 2020-11-21 NOTE — Telephone Encounter (Signed)
The [name] called and stated that they received a form for Jordan [name] medical records, they wanted to know if that is what is actually needed or does the form need to be filled out by the PCP.

## 2020-11-21 NOTE — Telephone Encounter (Signed)
Called and the answering service answered and I left a message for Dr Cathey Endow assistant that we received the documents needed for pt to get his diabetic inserts so we are good and to call if any further questions.

## 2020-11-24 ENCOUNTER — Encounter: Payer: Self-pay | Admitting: Podiatry

## 2020-11-24 ENCOUNTER — Ambulatory Visit: Payer: Medicare HMO | Admitting: Podiatry

## 2020-11-24 ENCOUNTER — Other Ambulatory Visit: Payer: Self-pay

## 2020-11-24 DIAGNOSIS — B351 Tinea unguium: Secondary | ICD-10-CM

## 2020-11-24 DIAGNOSIS — M79674 Pain in right toe(s): Secondary | ICD-10-CM | POA: Diagnosis not present

## 2020-11-24 DIAGNOSIS — L84 Corns and callosities: Secondary | ICD-10-CM | POA: Diagnosis not present

## 2020-11-24 DIAGNOSIS — M79675 Pain in left toe(s): Secondary | ICD-10-CM

## 2020-11-24 DIAGNOSIS — E119 Type 2 diabetes mellitus without complications: Secondary | ICD-10-CM

## 2020-11-24 NOTE — Progress Notes (Signed)
Subjective: Jordan [name]. is a pleasant 75 y.o. male patient seen today for diabetic foot care with h/o painful thick toenails that are difficult to trim. Pain interferes with ambulation. Aggravating factors include wearing enclosed shoe gear. Pain is relieved with periodic professional debridement.  He states his blood glucose was 108 mg/dl yesterday. He voices no new pedal problems on today's visit.   He is doing well with his prostate cancer treatment.  PCP is Iona Beard, MD. Last visit was: 2 weeks ago.  Allergies  Allergen Reactions   Codeine     Rash itching   Statins Other (See Comments)    Muscle aches Muscle aches   Objective: Physical Exam  General: Jordan [name]. is a pleasant 75 y.o. African American male, morbidly obese in NAD. AAO x 3.   Vascular:  Capillary fill time to digits <3 seconds b/l lower extremities. Palpable DP pulse(s) b/l lower extremities Palpable PT pulse(s) b/l lower extremities Pedal hair absent. Lower extremity skin temperature gradient within normal limits. No pain with calf compression b/l.  Dermatological:  Pedal skin with normal turgor, texture and tone bilaterally. No open wounds bilaterally. No interdigital macerations bilaterally. Toenails 1-5 b/l elongated, discolored, dystrophic, thickened, crumbly with subungual debris and tenderness to dorsal palpation. Incurvated nailplate medial border(s) L hallux and R hallux.  Nail border hypertrophy absent. There is tenderness to palpation. Sign(s) of infection: no clinical signs of infection noted on examination today. Hyperkeratotic lesion(s) L 2nd toe, R 2nd toe, submet head 1 left foot and submet head 1 right foot.  No erythema, no edema, no drainage, no fluctuance.  Musculoskeletal:  Normal muscle strength 5/5 to all lower extremity muscle groups bilaterally. No pain crepitus or joint limitation noted with ROM b/l. Hammertoe(s) noted to the 2-5 bilaterally. Utilizes cane for ambulation  assistance.  Neurological:  Protective sensation intact 5/5 intact bilaterally with 10g monofilament b/l.  Assessment and Plan:  1. Pain due to onychomycosis of toenails of both feet   2. Corns and callosities   3. Type 2 diabetes mellitus without complication, without long-term current use of insulin (HCC)     -Examined patient. -Patient to continue soft, supportive shoe gear daily. -Toenails 1-5 b/l were debrided in length and girth with sterile nail nippers and dremel without iatrogenic bleeding.  -Offending nail border debrided and curretaged L hallux and R hallux utilizing sterile nail nipper and currette. Border(s) cleansed with alcohol. No further treatment required. -Corn(s) L 2nd toe and R 2nd toe and callus(es) submet head 1 left foot and submet head 1 right foot were pared utilizing sterile scalpel blade without incident. Total number debrided =4. -Patient to report any pedal injuries to medical professional immediately. -Patient/POA to call should there be question/concern in the interim.  Return in about 3 months (around 02/23/2021).  Marzetta Board, DPM

## 2020-11-27 ENCOUNTER — Telehealth: Payer: Self-pay

## 2020-11-27 NOTE — Telephone Encounter (Signed)
Patient wife called wanting to get billing information for treatments provided in radiation from dates August 9-November 07, 2020.  Informed Jordan [name] that the billing department would be able to assist in this matter and provided the contact number to reach out to them.  No further issues at this time.

## 2020-11-30 ENCOUNTER — Other Ambulatory Visit: Payer: Self-pay

## 2020-11-30 ENCOUNTER — Encounter: Payer: Self-pay | Admitting: Urology

## 2020-11-30 ENCOUNTER — Ambulatory Visit: Payer: Medicare HMO | Admitting: Urology

## 2020-11-30 VITALS — BP 112/68 | HR 69

## 2020-11-30 DIAGNOSIS — R3915 Urgency of urination: Secondary | ICD-10-CM | POA: Diagnosis not present

## 2020-11-30 DIAGNOSIS — C61 Malignant neoplasm of prostate: Secondary | ICD-10-CM

## 2020-11-30 DIAGNOSIS — R972 Elevated prostate specific antigen [PSA]: Secondary | ICD-10-CM | POA: Insufficient documentation

## 2020-11-30 DIAGNOSIS — R351 Nocturia: Secondary | ICD-10-CM | POA: Diagnosis not present

## 2020-11-30 DIAGNOSIS — N403 Nodular prostate with lower urinary tract symptoms: Secondary | ICD-10-CM | POA: Diagnosis not present

## 2020-11-30 LAB — URINALYSIS, ROUTINE W REFLEX MICROSCOPIC
Bilirubin, UA: NEGATIVE
Glucose, UA: NEGATIVE
Leukocytes,UA: NEGATIVE
Nitrite, UA: NEGATIVE
Protein,UA: NEGATIVE
RBC, UA: NEGATIVE
Specific Gravity, UA: 1.02 (ref 1.005–1.030)
Urobilinogen, Ur: 2 mg/dL — ABNORMAL HIGH (ref 0.2–1.0)
pH, UA: 7.5 (ref 5.0–7.5)

## 2020-11-30 MED ORDER — DEGARELIX ACETATE 80 MG ~~LOC~~ SOLR
80.0000 mg | Freq: Once | SUBCUTANEOUS | Status: AC
  Administered 2020-11-30: 80 mg via SUBCUTANEOUS

## 2020-11-30 NOTE — Progress Notes (Signed)
Urological Symptom Review  Patient is experiencing the following symptoms: Frequent urination Hard to postpone urination Get up at night to urinate   Review of Systems  Gastrointestinal (upper)  : Negative for upper GI symptoms  Gastrointestinal (lower) : Negative for lower GI symptoms  Constitutional : Negative for symptoms  Skin: Negative for skin symptoms  Eyes: Negative for eye symptoms  Ear/Nose/Throat : Negative for Ear/Nose/Throat symptoms  Hematologic/Lymphatic: Negative for Hematologic/Lymphatic symptoms  Cardiovascular : Negative for cardiovascular symptoms  Respiratory : Negative for respiratory symptoms  Endocrine: Negative for endocrine symptoms  Musculoskeletal: Negative for musculoskeletal symptoms  Neurological: Negative for neurological symptoms  Psychologic: Negative for psychiatric symptoms  

## 2020-11-30 NOTE — Progress Notes (Signed)
Firmagon Sub Q Injection  Due to Prostate Cancer patient is present today for a Firmagon Injection.   Medication: Mills Koller (Degarelix)  Dose: 80mg  Location: left upper abdomen   Patient tolerated well, no complications were noted  Performed by: Estill Bamberg RN  Follow up: 1 month NV Mills Koller

## 2020-11-30 NOTE — Progress Notes (Signed)
Subjective: 1. Prostate cancer ([place])   2. 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 Nocturia   4. Urgency of urination    11/30/20: Jordan [name] returns today for the history noted below.  His PSA is 1.3.  Jordan [name] completed EXRT on 11/07/20.  Jordan [name] remains on firmagon and is due for an injection today.  Jordan [name] has worsening LUTS with an IPSS of 17.  Jordan [name] was started on tamsulosin by Dr. Tammi Klippel but Jordan [name] still has nocturia x 3 and urgency.  Jordan [name] has no GI complaints but is on metamucil and is using some miralax to help reduce the need to strain.  Jordan [name] has no bone pain or weight loss but has neuropathy and arthritis.  Jordan [name] has a few hot flashes.   08/03/20: Jordan [name] returns today in f/u.  Jordan [name] is a month out from firmagon 240mg  and his PSA is down to 1.2 with a T of 22.  Jordan [name] is scheduled for a seed implant with [employer] on 09/08/20.  Jordan [name] is tolerating the injection well  with just mild hot flashes. Jordan [name] is voiding well with an IPSS of 2.  His UA is unremarkable.   06/29/20:  Jordan [name] returns today to discuss LT-ADT as a component of treatment of his Gleason 8 high volume prostate cancer.   I have reviewed the risks and side effects ADT in detail and will initiate firmagon today and continue that monthly.    06/08/20: Jordan [name] returns today in f/u to discuss the results of his prostate biopsy and staging studies.  Jordan [name] was found to have a 35.31ml prostate with 6/6 right cores positive for cancer.  5/6 were GG4 with up to 90% involvement and 1/6 was GG3.  Jordan [name] has high risk disease and has undergone staging with bone scan and CT with both being negative.  Jordan [name] has stage T2b N0 M0 cancer.  Jordan [name] has DM, CAD and HTN as well as morbid obesity but swims regularly and is compliant with his meds.  Jordan [name] should have a reasonable 5 year life expectancy.   Hx: Jordan [name]  is a 75 yo male who is sent by [name] for an increase in his PSA from 1.7 on 02/02/18 to 3.58 on 02/15/20.  Jordan [name] is voiding well with nocturia 1-2x.  His IPSS is 1. Jordan [name] has a good stream but  it sprays a bit.  Jordan [name] has ED but is no longer responding to sildenafil.  Jordan [name] has had no UTI's or GU surgery.  His father had some prostate issues, possibly cancer, but Jordan [name] had other medical issues so Jordan [name] was not felt to be a biopsy candidate.    IPSS     Row Name 11/30/20 1400         International Prostate Symptom Score   How often have you had the sensation of not emptying your bladder? About half the time     How often have you had to urinate less than every two hours? About half the time     How often have you found you stopped and started again several times when you urinated? Less than half the time     How often have you found it difficult to postpone urination? Almost always     How often have you had a weak urinary stream? Less than 1 in 5 times     How often have you had to strain to start urination? Not at All     How many times did you  typically get up at night to urinate? 3 Times     Total IPSS Score 17           Quality of Life due to urinary symptoms   If you were to spend the rest of your life with your urinary condition just the way it is now how would you feel about that? Mixed              ROS:  ROS  Allergies  Allergen Reactions   Codeine     Rash itching   Statins Other (See Comments)    Muscle aches Muscle aches    Past Medical History:  Diagnosis Date   Arthritis    all over oa   CAD (coronary artery disease)    Cancer ([place])    Prostate   Colon polyps 01/23/2010   Descending   Diverticulosis    dm type 2    Eczema 09/05/2020   Gastric ulcer 2010   GI bleed 2010   Hyperlipidemia    Hypertension    Hypotension    transient   Neuropathy 09/05/2020   both hands and feet   OSA (obstructive sleep apnea)    Severe, on CPAP therapy   Prostate cancer ([place])    Wears glasses 09/05/2020    Past Surgical History:  Procedure Laterality Date   CARDIAC CATHETERIZATION  05/01/2007   Recommend rotational atherectomy followed by PTCA and probable  stenting of LAD.   CARDIAC CATHETERIZATION  05/28/2007   Mid LAD hihgh grade 80-90% stenosis, stented with a 3x56mm Cypher stent implanted at 16atm, postdilated with a 3.25x24mm Quantum at 16atm, which revealed excellent wall appoistion - 5.99mm   CARDIOVASCULAR STRESS TEST  02/05/2011   Perfusion defect seen in inferior myocardial region consistent with diagphragmatic attenuation. Remaining myocardium demonstrates normal myocardial perfusion with no evidence of ischemia or infarct. ECG is positive for ischemia.   colonscopy  2012   CPAP/BIPAP SLEEP STUDY  07/01/2007   AHI-0.5/hr at 11cm water pressure, RDI-12.6/hr   CYSTOSCOPY  09/08/2020   Procedure: CYSTOSCOPY FLEXIBLE;  Surgeon: Irine Seal, MD;  [facility];  Service: Urology;;  NO SEEDS FOUND IN BLADDER   LOWER ARTERIAL DOPPLER  06/03/2007   No evidence of dissection, AV fistula, or active pseudoaneurysm.   NASAL ENDOSCOPY  12/2008   RADIOACTIVE SEED IMPLANT N/A 09/08/2020   Procedure: RADIOACTIVE SEED IMPLANT/BRACHYTHERAPY IMPLANT;  Surgeon: Irine Seal, MD;  Location: [facility];  Service: Urology;  Laterality: N/A;  51 SEEDS IMPLANTED   SLEEP STUDY  05/13/2007   AHI-11.52/hr, AHI REM-56.0/hr, RDI-11.7/hr, RDI REM-56.0/hr, avg oxygen sat-94%   SPACE OAR INSTILLATION N/A 09/08/2020   Procedure: SPACE OAR INSTILLATION;  Surgeon: Irine Seal, MD;  Location[facility];  Service: Urology;  Laterality: N/A;   stent to heart x 1  2010   TRANSTHORACIC ECHOCARDIOGRAM  03/13/2010   EF >18%, normal diastolic function, mild MR, mild TR, and normal RVSP    Social History   Socioeconomic History   Marital status: Married    Spouse name: Not on file   Number of children: 2   Years of education: Not on file   Highest education level: Not on file  Occupational History   Occupation: retired  Tobacco Use   Smoking status: Former    Packs/day: 0.50    Years: 16.00    Pack years: 8.00     Types: Cigarettes    Quit date: 01/28/1978  Years since quitting: 42.8   Smokeless tobacco: Never  Vaping Use   Vaping Use: Never used  Substance and Sexual Activity   Alcohol use: Not Currently   Drug use: No   Sexual activity: Yes  Other Topics Concern   Not on file  Social History Narrative   Not on file   Social Determinants of Health   Financial Resource Strain: Not on file  Food Insecurity: Not on file  Transportation Needs: Not on file  Physical Activity: Not on file  Stress: Not on file  Social Connections: Not on file  Intimate Partner Violence: Not on file    Family History  Problem Relation Age of Onset   Stroke Brother    Hypertension Brother    Alzheimer's disease Maternal Grandmother    Stroke Maternal Grandfather    Heart disease Maternal Grandfather    Stroke Paternal Grandmother    Heart attack Paternal Grandfather    Hypertension Father    Alzheimer's disease Father    Parkinson's disease Father    Alzheimer's disease Mother    Pancreatic cancer Maternal Uncle    Breast cancer Neg Hx    Prostate cancer Neg Hx    Colon cancer Neg Hx     Anti-infectives: Anti-infectives (From admission, onward)    None       Current Outpatient Medications  Medication Sig Dispense Refill   acetaminophen (TYLENOL) 650 MG CR tablet Take 650 mg by mouth every 8 (eight) hours as needed.      Apoaequorin (PREVAGEN PO) Take 20 mg by mouth daily.     aspirin 81 MG tablet Take 81 mg by mouth daily.     carvedilol (COREG) 3.125 MG tablet TAKE 1 TABLET EVERY DAY (NEED MD APPOINTMENT FOR REFILLS) (Patient taking differently: daily.) 90 tablet 3   CINNAMON PO Take 2 tablets by mouth 2 (two) times daily. 1000 mg tablet     ezetimibe (ZETIA) 10 MG tablet Take 10 mg by mouth daily.     FIBER ADULT GUMMIES PO Take by mouth at bedtime. 2 or 3 tabs per day     HYDROcodone-acetaminophen (NORCO/VICODIN) 5-325 MG tablet Take 1 tablet by mouth every 6 (six) hours as needed for  moderate pain. 6 tablet 0   ketoconazole (NIZORAL) 2 % cream Apply 1 application topically daily.     lisinopril (ZESTRIL) 5 MG tablet TAKE 1/2 TABLET EVERY DAY (NEED MD APPOINTMENT FOR REFILLS) (Patient taking differently: daily.) 45 tablet 3   metFORMIN (GLUCOPHAGE) 500 MG tablet 500 mg daily with breakfast.     MODERNA COVID-19 VACCINE 100 MCG/0.5ML injection      NON FORMULARY CPAP     pantoprazole (PROTONIX) 40 MG tablet TAKE 1 TABLET EVERY DAY (Patient taking differently: daily.) 90 tablet 3   psyllium (METAMUCIL) 58.6 % packet Take by mouth daily. 1 Tablespoon daily     simvastatin (ZOCOR) 40 MG tablet Take 40 mg by mouth at bedtime.     tamsulosin (FLOMAX) 0.4 MG CAPS capsule Take 1 capsule (0.4 mg total) by mouth daily after supper. 30 capsule 5   Current Facility-Administered Medications  Medication Dose Route Frequency Provider Last Rate Last Admin   degarelix (FIRMAGON) injection 80 mg  80 mg Subcutaneous Once Irine Seal, MD         Objective: Vital signs in last 24 hours: BP 112/68   Pulse 69   Intake/Output from previous day: No intake/output data recorded. Intake/Output this shift: @IOTHISSHIFT @   Physical Exam  Recent Results (from the past 2160 hour(s))  CBC     Status: None   Collection Time: 09/05/20  9:22 AM  Result Value Ref Range   WBC 8.0 4.0 - 10.5 K/uL   RBC 4.85 4.22 - 5.81 MIL/uL   Hemoglobin 14.5 13.0 - 17.0 g/dL   HCT 44.3 39.0 - 52.0 %   MCV 91.3 80.0 - 100.0 fL   MCH 29.9 26.0 - 34.0 pg   MCHC 32.7 30.0 - 36.0 g/dL   RDW 13.7 11.5 - 15.5 %   Platelets 238 150 - 400 K/uL   nRBC 0.0 0.0 - 0.2 %    Comment: Performed at [facility], [place] [address], [place], [place] [address]  Comprehensive metabolic panel     Status: Abnormal   Collection Time: 09/05/20  9:22 AM  Result Value Ref Range   Sodium 141 135 - 145 mmol/L   Potassium 4.1 3.5 - 5.1 mmol/L   Chloride 105 98 - 111 mmol/L   CO2 29 22 - 32 mmol/L   Glucose,  Bld 115 (H) 70 - 99 mg/dL    Comment: Glucose reference range applies only to samples taken after fasting for at least 8 hours.   BUN 12 8 - 23 mg/dL   Creatinine, Ser 0.78 0.61 - 1.24 mg/dL   Calcium 9.5 8.9 - 10.3 mg/dL   Total Protein 7.9 6.5 - 8.1 g/dL   Albumin 4.1 3.5 - 5.0 g/dL   AST 19 15 - 41 U/L   ALT 19 0 - 44 U/L   Alkaline Phosphatase 58 38 - 126 U/L   Total Bilirubin 0.6 0.3 - 1.2 mg/dL   GFR, Estimated >60 >60 mL/min    Comment: (NOTE) Calculated using the CKD-EPI Creatinine Equation (2021)    Anion gap 7 5 - 15    Comment: Performed at [facility], [place] [address], [place], [place] [address]  Protime-INR     Status: None   Collection Time: 09/05/20  9:22 AM  Result Value Ref Range   Prothrombin Time 13.2 11.4 - 15.2 seconds   INR 1.0 0.8 - 1.2    Comment: (NOTE) INR goal varies based on device and disease states. Performed at [facility], [place] [address] [place], [place] [address]   APTT     Status: None   Collection Time: 09/05/20  9:22 AM  Result Value Ref Range   aPTT 31 24 - 36 seconds    Comment: Performed at [facility], [place] [address], [place], [place] [address]  Glucose, capillary     Status: Abnormal   Collection Time: 09/08/20 10:50 AM  Result Value Ref Range   Glucose-Capillary 103 (H) 70 - 99 mg/dL    Comment: Glucose reference range applies only to samples taken after fasting for at least 8 hours.  Glucose, capillary     Status: None   Collection Time: 09/08/20  2:36 PM  Result Value Ref Range   Glucose-Capillary 97 70 - 99 mg/dL    Comment: Glucose reference range applies only to samples taken after fasting for at least 8 hours.  PSA     Status: None   Collection Time: 10/26/20 11:24 AM  Result Value Ref Range   Prostate Specific Ag, Serum 1.3 0.0 - 4.0 ng/mL    Comment: Roche ECLIA methodology. According to the American Urological Association, Serum PSA should decrease and remain  at undetectable levels after radical prostatectomy. The AUA defines  biochemical recurrence as an initial PSA value 0.2 ng/mL or greater followed by a subsequent confirmatory PSA value 0.2 ng/mL or greater. Values obtained with different assay methods or kits cannot be used interchangeably. Results cannot be interpreted as absolute evidence of the presence or absence of malignant disease.       Studies/Results: I have reviewed the notes from Dr. Tammi Klippel.     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   Jordan [name] is tolerating firmagon and will continue that monthly for 73mo.   Jordan [name] tolerated the EXRT but has some increased OAB symptoms.  His PSA was 1.3 on 9/1 during therapy.   BPH with BOO and OAB.   This is worse and Jordan [name] will stay on tamsulosin.       Meds ordered this encounter  Medications   degarelix (FIRMAGON) injection 80 mg     Orders Placed This Encounter  Procedures   Urinalysis, Routine w reflex microscopic   PSA    Standing Status:   Future    Standing Expiration Date:   05/31/2021     Return in about 3 months (around 03/02/2021) for with labs.   continue monthly firmagon. .    CC: Dr. Iona Beard, Dr. Ledon Snare  and Dr. Shelva Majestic.    Irine Seal 11/30/2020 [phone] ID: Jordan [name]., male   DOB: 1945/05/28, 75 y.o.   MRN: [id number]

## 2020-12-01 LAB — PSA: Prostate Specific Ag, Serum: 0.5 ng/mL (ref 0.0–4.0)

## 2020-12-01 NOTE — Progress Notes (Signed)
Result sent via mychart.   

## 2020-12-02 NOTE — Progress Notes (Signed)
  Radiation Oncology         [phone] ________________________________  Name: Jordan [name]. MRN: [id number]  Date: 11/07/2020  DOB: December 03, 1945  End of Treatment Note  Diagnosis:   75 y.o. 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     Indication for treatment:  Curative, Definitive Radiotherapy       Radiation treatment dates:   09/08/20 and 10/03/20-11/07/20  Site/dose:  Radioactive seeds were implanted into the prostate for a total of 110 Gy. The prostate, seminal vesicles, and pelvic lymph nodes were boosted with 45 Gy in 25 fractions of 1.8 Gy, for a total dose of 155 Gy  Beams/energy:  The radioactive seeds were delivered under sagittal guidance with 3D imaging. The prostate, seminal vesicles, and pelvic lymph nodes were initially treated using helical intensity modulated radiotherapy delivering 6 megavolt photons. Image guidance was performed with megavoltage CT studies prior to each fraction. He was immobilized with a body fix lower extremity mold.   Narrative: The patient tolerated radiation treatment relatively well. The patient experienced some minor urinary irritation and modest fatigue.    Plan: The patient has completed radiation treatment. He will return to radiation oncology clinic for routine followup in one month. I advised him to call or return sooner if he has any questions or concerns related to his recovery or treatment. ________________________________  Sheral Apley. Tammi Klippel, M.D.

## 2020-12-11 ENCOUNTER — Encounter: Payer: Self-pay | Admitting: Urology

## 2020-12-11 NOTE — Progress Notes (Signed)
Patient states typical, unrelated arthritis pain, and fatigue. No other symptoms reported at this time.   Flomax 0.4 mg taken as directed & urology follow-up scheduled for early November 2022- per patient.  I-PSS Score of 3 (mild).  Meaningful use complete.  Patient notified of 10:30am-12/13/20 telephone appointment and verbalized understanding.

## 2020-12-13 ENCOUNTER — Ambulatory Visit
Admission: RE | Admit: 2020-12-13 | Discharge: 2020-12-13 | Disposition: A | Discharge status: Home / Self Care | Payer: Medicare HMO | Source: Ambulatory Visit | Attending: Urology | Admitting: Urology

## 2020-12-13 ENCOUNTER — Other Ambulatory Visit: Payer: Self-pay | Admitting: Urology

## 2020-12-13 DIAGNOSIS — C61 Malignant neoplasm of prostate: Secondary | ICD-10-CM

## 2020-12-13 NOTE — Progress Notes (Signed)
Radiation Oncology         [phone]) [phone] ________________________________  Name: Jordan [name]. MRN: [id number]  Date: 12/13/2020  DOB: 1945/12/24  Post Treatment Note  CC: Iona Beard, MD  Irine Seal, MD  Diagnosis:   75 y.o. 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  Interval Since Last Radiation:  5 weeks (concurrent with monthly Firmagon ADT started 06/29/20) 09/08/20:  Radioactive seeds were implanted into the prostate for a total of 110 Gy.  10/03/20-11/07/20:  The prostate, seminal vesicles, and pelvic lymph nodes were boosted with 45 Gy in 25 fractions of 1.8 Gy, for a total dose of 155 Gy  Narrative:  I spoke with the patient to conduct his routine scheduled 1 month follow up visit via telephone to spare the patient unnecessary potential exposure in the healthcare setting during the current COVID-19 pandemic.  The patient was notified in advance and gave permission to proceed with this visit format.  He tolerated radiation treatment relatively well with only minor urinary irritation and modest fatigue.  He reported increased frequency, urgency and nocturia x4.  He also continued with his chronic constipation which was managed with Metamucil daily.                             On review of systems, the patient states that he is doing very well in general.  He continues with mild to moderate fatigue as well as nocturia x3 per night.  Otherwise, he feels that his LUTS continue to improve and he specifically denies any gross hematuria, dysuria, straining to void, incomplete bladder emptying or incontinence.  He reports a healthy appetite and is maintaining his weight.  He denies any abdominal pain, nausea, vomiting, diarrhea or constipation.  He is planning to get back into swimming 3 to 4 days/week for exercise at the [facility] and I think this will help with his energy level.  He denies any bothersome hot flashes with the ADT.  Overall, he  is pleased with his progress to date.  ALLERGIES:  is allergic to codeine and statins.  Meds: Current Outpatient Medications  Medication Sig Dispense Refill   acetaminophen (TYLENOL) 650 MG CR tablet Take 650 mg by mouth every 8 (eight) hours as needed.      Apoaequorin (PREVAGEN PO) Take 20 mg by mouth daily.     aspirin 81 MG tablet Take 81 mg by mouth daily.     carvedilol (COREG) 3.125 MG tablet TAKE 1 TABLET EVERY DAY (NEED MD APPOINTMENT FOR REFILLS) (Patient taking differently: daily.) 90 tablet 3   CINNAMON PO Take 2 tablets by mouth 2 (two) times daily. 1000 mg tablet     ezetimibe (ZETIA) 10 MG tablet Take 10 mg by mouth daily.     FIBER ADULT GUMMIES PO Take by mouth at bedtime. 2 or 3 tabs per day     HYDROcodone-acetaminophen (NORCO/VICODIN) 5-325 MG tablet Take 1 tablet by mouth every 6 (six) hours as needed for moderate pain. 6 tablet 0   ketoconazole (NIZORAL) 2 % cream Apply 1 application topically daily.     lisinopril (ZESTRIL) 5 MG tablet TAKE 1/2 TABLET EVERY DAY (NEED MD APPOINTMENT FOR REFILLS) (Patient taking differently: daily.) 45 tablet 3   metFORMIN (GLUCOPHAGE) 500 MG tablet 500 mg daily with breakfast.     MODERNA COVID-19 VACCINE 100 MCG/0.5ML injection  NON FORMULARY CPAP     pantoprazole (PROTONIX) 40 MG tablet TAKE 1 TABLET EVERY DAY (Patient taking differently: daily.) 90 tablet 3   psyllium (METAMUCIL) 58.6 % packet Take by mouth daily. 1 Tablespoon daily     simvastatin (ZOCOR) 40 MG tablet Take 40 mg by mouth at bedtime.     tamsulosin (FLOMAX) 0.4 MG CAPS capsule Take 1 capsule (0.4 mg total) by mouth daily after supper. 30 capsule 5   No current facility-administered medications for this encounter.    Physical Findings:  vitals were not taken for this visit.  Pain Assessment Pain Score: 0-No pain/10 Unable to assess due to telephone follow-up visit format.  Lab Findings: Lab Results  Component Value Date   WBC 8.0 09/05/2020   HGB 14.5  09/05/2020   HCT 44.3 09/05/2020   MCV 91.3 09/05/2020   PLT 238 09/05/2020     Radiographic Findings: No results found.  Impression/Plan: 1. 75 y.o. 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 He will continue to follow up with urology for ongoing PSA determinations and had a recent follow-up visit with Dr. Jeffie Pollock on 11/30/2020.  The plan is to continue on monthly Firmagon injections for a total of 18 to 24 months and he continues to tolerate this fairly well.  His most recent PSA drawn at that visit was 0.5, indicating a good response to treatment.  He is scheduled for his next Firmagon injection on 01/02/2021 and will see Dr. Jeffie Pollock next in January 2023.  He understands what to expect with regards to PSA monitoring going forward.  He will also continue taking Flomax daily as prescribed for continued management of increased LUTS associated with his recent radiation.  We discussed that these should continue to improve over the next 2 to 6 months and he will likely be able to come off the Flomax in the near future.  He is encouraged by his progress to date.  I will look forward to following his response to treatment via correspondence with urology, and would be happy to continue to participate in his care if clinically indicated. I talked to the patient about what to expect in the future, including his risk for erectile dysfunction and rectal bleeding. I encouraged him to call or return to the office if he has any questions regarding his previous radiation or possible radiation side effects. He was comfortable with this plan and will follow up as needed.     Nicholos Johns, PA-C

## 2020-12-25 ENCOUNTER — Other Ambulatory Visit: Payer: Self-pay | Admitting: Gastroenterology

## 2020-12-25 DIAGNOSIS — E1142 Type 2 diabetes mellitus with diabetic polyneuropathy: Secondary | ICD-10-CM | POA: Diagnosis not present

## 2020-12-25 DIAGNOSIS — I10 Essential (primary) hypertension: Secondary | ICD-10-CM | POA: Diagnosis not present

## 2020-12-25 DIAGNOSIS — E785 Hyperlipidemia, unspecified: Secondary | ICD-10-CM | POA: Diagnosis not present

## 2020-12-27 ENCOUNTER — Telehealth: Payer: Self-pay | Admitting: Podiatry

## 2020-12-27 NOTE — Telephone Encounter (Signed)
Diabetic inserts in..lvm for pt to call to schedule an appt to pick them up. °

## 2021-01-02 ENCOUNTER — Ambulatory Visit (INDEPENDENT_AMBULATORY_CARE_PROVIDER_SITE_OTHER): Payer: Medicare HMO

## 2021-01-02 ENCOUNTER — Other Ambulatory Visit: Payer: Self-pay

## 2021-01-02 ENCOUNTER — Telehealth: Payer: Self-pay | Admitting: *Deleted

## 2021-01-02 DIAGNOSIS — C61 Malignant neoplasm of prostate: Secondary | ICD-10-CM | POA: Diagnosis not present

## 2021-01-02 MED ORDER — DEGARELIX ACETATE 80 MG ~~LOC~~ SOLR
80.0000 mg | Freq: Once | SUBCUTANEOUS | Status: AC
  Administered 2021-01-02: 80 mg via SUBCUTANEOUS

## 2021-01-02 NOTE — Progress Notes (Signed)
Firmagon Sub Q Injection  Due to Prostate Cancer patient is present today for a Firmagon Injection.   Medication: Firmagon (Degarelix)  Dose: 80mg  Location: right upper abdomen  Patient tolerated well, no complications were noted  Performed by: Montrice Montuori LPN  Follow up: keep next scheduled appointment

## 2021-01-14 ENCOUNTER — Other Ambulatory Visit: Payer: Self-pay | Admitting: Cardiovascular Disease

## 2021-01-19 ENCOUNTER — Other Ambulatory Visit: Payer: Self-pay | Admitting: Cardiovascular Disease

## 2021-01-22 ENCOUNTER — Other Ambulatory Visit: Payer: Self-pay | Admitting: Cardiovascular Disease

## 2021-01-24 DIAGNOSIS — I10 Essential (primary) hypertension: Secondary | ICD-10-CM | POA: Diagnosis not present

## 2021-01-24 DIAGNOSIS — E1142 Type 2 diabetes mellitus with diabetic polyneuropathy: Secondary | ICD-10-CM | POA: Diagnosis not present

## 2021-01-24 DIAGNOSIS — E785 Hyperlipidemia, unspecified: Secondary | ICD-10-CM | POA: Diagnosis not present

## 2021-01-25 ENCOUNTER — Other Ambulatory Visit: Payer: Self-pay

## 2021-01-25 ENCOUNTER — Telehealth: Payer: Self-pay

## 2021-01-25 ENCOUNTER — Other Ambulatory Visit: Payer: Medicare HMO

## 2021-01-25 DIAGNOSIS — C61 Malignant neoplasm of prostate: Secondary | ICD-10-CM

## 2021-01-25 DIAGNOSIS — R319 Hematuria, unspecified: Secondary | ICD-10-CM

## 2021-01-25 LAB — URINALYSIS, ROUTINE W REFLEX MICROSCOPIC
Bilirubin, UA: NEGATIVE
Glucose, UA: NEGATIVE
Leukocytes,UA: NEGATIVE
Nitrite, UA: NEGATIVE
Specific Gravity, UA: 1.025 (ref 1.005–1.030)
Urobilinogen, Ur: 1 mg/dL (ref 0.2–1.0)
pH, UA: 5.5 (ref 5.0–7.5)

## 2021-01-25 LAB — MICROSCOPIC EXAMINATION
Bacteria, UA: NONE SEEN
RBC, Urine: >30 /hpf — AB (ref 0–2)
Renal Epithel, UA: NONE SEEN /hpf
WBC, UA: NONE SEEN /hpf (ref 0–5)

## 2021-01-25 NOTE — Telephone Encounter (Signed)
Patient states that he has noticed some blood in his urine for the past couple of days which is new for him since his surgery seed implant 09/08/20. Patient states he has no burning or unusual pain. Patient placed on lab  schedule for urine drop off.

## 2021-01-25 NOTE — Telephone Encounter (Signed)
Mr. Deroche called advising he needed to speak to you regarding some concerns.

## 2021-01-26 NOTE — Telephone Encounter (Signed)
Patient called and made aware.

## 2021-01-27 LAB — URINE CULTURE: Organism ID, Bacteria: NO GROWTH

## 2021-01-29 ENCOUNTER — Telehealth: Payer: Self-pay

## 2021-01-29 NOTE — Telephone Encounter (Signed)
Patient called with no answer. Detailed message left on home voicemail.

## 2021-01-29 NOTE — Telephone Encounter (Signed)
-----   Message from Irine Seal, MD sent at 01/29/2021  9:23 AM EST ----- Negative.   ----- Message ----- From: Iris Pert, LPN Sent: [id number]   8:53 AM EST To: Irine Seal, MD  Please review

## 2021-01-30 ENCOUNTER — Ambulatory Visit: Payer: Medicare HMO

## 2021-01-30 ENCOUNTER — Telehealth: Payer: Self-pay | Admitting: *Deleted

## 2021-01-31 NOTE — Telephone Encounter (Signed)
His CT is scheduled for 12/19 and he has a f/u scheduled 1/12.

## 2021-02-01 ENCOUNTER — Ambulatory Visit (INDEPENDENT_AMBULATORY_CARE_PROVIDER_SITE_OTHER): Payer: Medicare HMO

## 2021-02-01 ENCOUNTER — Other Ambulatory Visit: Payer: Self-pay

## 2021-02-01 DIAGNOSIS — C61 Malignant neoplasm of prostate: Secondary | ICD-10-CM | POA: Diagnosis not present

## 2021-02-01 MED ORDER — DEGARELIX ACETATE 80 MG ~~LOC~~ SOLR
80.0000 mg | Freq: Once | SUBCUTANEOUS | Status: AC
  Administered 2021-02-01: 80 mg via SUBCUTANEOUS

## 2021-02-01 NOTE — Progress Notes (Signed)
Firmagon Sub Q Injection  Due to Prostate Cancer patient is present today for a Firmagon Injection.   Medication: Firmagon (Degarelix)  Dose: 80mg  Location: left upper abdomen  Patient tolerated well, no complications were noted  Performed by: Patrisha Hausmann LPN  Follow up: Keep next scheduled OV

## 2021-02-12 ENCOUNTER — Encounter (HOSPITAL_COMMUNITY): Payer: Self-pay

## 2021-02-12 ENCOUNTER — Telehealth: Payer: Self-pay | Admitting: *Deleted

## 2021-02-12 ENCOUNTER — Inpatient Hospital Stay: Payer: Medicare HMO | Attending: Urology | Admitting: *Deleted

## 2021-02-12 ENCOUNTER — Ambulatory Visit (HOSPITAL_COMMUNITY)
Admission: RE | Admit: 2021-02-12 | Discharge: 2021-02-12 | Disposition: A | Discharge status: Home / Self Care | Payer: Medicare HMO | Source: Ambulatory Visit | Attending: Urology | Admitting: Urology

## 2021-02-12 ENCOUNTER — Other Ambulatory Visit: Payer: Self-pay

## 2021-02-12 ENCOUNTER — Encounter: Payer: Self-pay | Admitting: *Deleted

## 2021-02-12 VITALS — BP 150/69 | HR 68 | Temp 98.4°F | Resp 20 | Ht 67.0 in | Wt 277.1 lb

## 2021-02-12 DIAGNOSIS — Z8546 Personal history of malignant neoplasm of prostate: Secondary | ICD-10-CM | POA: Diagnosis not present

## 2021-02-12 DIAGNOSIS — R319 Hematuria, unspecified: Secondary | ICD-10-CM | POA: Diagnosis not present

## 2021-02-12 DIAGNOSIS — N3289 Other specified disorders of bladder: Secondary | ICD-10-CM | POA: Diagnosis not present

## 2021-02-12 DIAGNOSIS — C61 Malignant neoplasm of prostate: Secondary | ICD-10-CM

## 2021-02-12 DIAGNOSIS — N200 Calculus of kidney: Secondary | ICD-10-CM | POA: Diagnosis not present

## 2021-02-12 LAB — POCT I-STAT CREATININE: Creatinine, Ser: 0.8 mg/dL (ref 0.61–1.24)

## 2021-02-12 MED ORDER — SODIUM CHLORIDE (PF) 0.9 % IJ SOLN
INTRAMUSCULAR | Status: AC
  Filled 2021-02-12: qty 50

## 2021-02-12 MED ORDER — SODIUM CHLORIDE 0.9 % IV SOLN
INTRAVENOUS | Status: AC
  Filled 2021-02-12: qty 250

## 2021-02-12 MED ORDER — IOHEXOL 350 MG/ML SOLN
100.0000 mL | Freq: Once | INTRAVENOUS | Status: AC | PRN
  Administered 2021-02-12: 10:00:00 100 mL via INTRAVENOUS

## 2021-02-12 NOTE — Progress Notes (Signed)
Dr.Wrenn's nurse called back to communicate with me that pt can restart their ASA 81 mg back. CT scan was clear and pt has not seen any blood in urine for 2 weeks. I call pt to let him know and also told him if he should see any blood in urine after starting back to discontinue aspirin again and call the [facility] office. Pt verbalized understanding.

## 2021-02-12 NOTE — Progress Notes (Signed)
SCP reviewed and completed.SDOH assessed and completed.  Pt tends to have nocturia x 3 but is able to go back to sleep . Pt is receiving monthly Firmagon injections.  He does have some tenderness to the left abdomen where last injection was given as well as some mild erythema. Pt does have ED which is not new, he says he had those issues before prostate cancer. Pt had mild hematuria x 2 weeks ago but has since resolved. Pt did have  a CT this am  for hematuria workup. Pt is updated with vaccines. Pt is unable to exercise right now because of knee issues. I encouraged him to do some chair stretches. VS were WNL.

## 2021-02-13 ENCOUNTER — Ambulatory Visit: Payer: Medicare HMO

## 2021-02-13 DIAGNOSIS — C61 Malignant neoplasm of prostate: Secondary | ICD-10-CM | POA: Diagnosis not present

## 2021-02-13 DIAGNOSIS — L84 Corns and callosities: Secondary | ICD-10-CM

## 2021-02-13 DIAGNOSIS — E119 Type 2 diabetes mellitus without complications: Secondary | ICD-10-CM

## 2021-02-13 DIAGNOSIS — M2041 Other hammer toe(s) (acquired), right foot: Secondary | ICD-10-CM | POA: Diagnosis not present

## 2021-02-13 DIAGNOSIS — M2042 Other hammer toe(s) (acquired), left foot: Secondary | ICD-10-CM | POA: Diagnosis not present

## 2021-02-13 DIAGNOSIS — E1169 Type 2 diabetes mellitus with other specified complication: Secondary | ICD-10-CM | POA: Diagnosis not present

## 2021-02-13 DIAGNOSIS — I10 Essential (primary) hypertension: Secondary | ICD-10-CM | POA: Diagnosis not present

## 2021-02-13 DIAGNOSIS — G4733 Obstructive sleep apnea (adult) (pediatric): Secondary | ICD-10-CM | POA: Diagnosis not present

## 2021-02-13 DIAGNOSIS — Z6841 Body Mass Index (BMI) 40.0 and over, adult: Secondary | ICD-10-CM | POA: Diagnosis not present

## 2021-02-13 DIAGNOSIS — M150 Primary generalized (osteo)arthritis: Secondary | ICD-10-CM | POA: Diagnosis not present

## 2021-02-13 NOTE — Progress Notes (Signed)
SITUATION Reason for Visit: Fitting of Diabetic Insoles Patient / Caregiver Report:  Patient reports comfort with new insoles  OBJECTIVE DATA: Patient History / Diagnosis:     ICD-10-CM   1. Type 2 diabetes mellitus without complication, without long-term current use of insulin (HCC)  E11.9     2. Corns and callosities  L84       Change in Status:   None  ACTIONS PERFORMED: In-Person Delivery, patient was fit with: - 3x pair [id number] PDAC approved CAM milled custom diabetic insoles  Shoes and insoles were verified for structural integrity and safety. Patient wore shoes and insoles in office. Skin was inspected and free of areas of concern after wearing shoes and inserts. Shoes and inserts fit properly. Patient / Caregiver provided with ferbal instruction and demonstration regarding donning, doffing, wear, care, proper fit, function, purpose, cleaning, and use of shoes and insoles ' and in all related precautions and risks and benefits regarding shoes and insoles. Patient / Caregiver was instructed to wear properly fitting socks with shoes at all times. Patient was also provided with verbal instruction regarding how to report any failures or malfunctions of shoes or inserts, and necessary follow up care. Patient / Caregiver was also instructed to contact physician regarding change in status that may affect function of shoes and inserts.   Patient / Caregiver verbalized undersatnding of instruction provided. Patient / Caregiver demonstrated independence with proper donning and doffing of shoes and inserts.  PLAN Patient to follow up as needed. Plan of care was discussed with and agreed upon by patient and/or caregiver. All questions were answered and concerns addressed.

## 2021-02-15 ENCOUNTER — Telehealth: Payer: Self-pay

## 2021-02-15 NOTE — Telephone Encounter (Signed)
-----   Message from Irine Seal, MD sent at 02/15/2021  8:43 AM EST ----- He has small non-obstructing kidney stones that don't need treatment at this time.   He should keep f/u as planned and will need cystoscopy at that visit.  ----- Message ----- From: Iris Pert, LPN Sent: [id number]/5672   5:13 PM EST To: Irine Seal, MD  Please review

## 2021-02-15 NOTE — Telephone Encounter (Signed)
Patient called and made aware.

## 2021-02-28 DIAGNOSIS — E119 Type 2 diabetes mellitus without complications: Secondary | ICD-10-CM | POA: Diagnosis not present

## 2021-03-02 ENCOUNTER — Other Ambulatory Visit: Payer: Self-pay

## 2021-03-02 ENCOUNTER — Other Ambulatory Visit: Payer: Medicare HMO

## 2021-03-02 DIAGNOSIS — C61 Malignant neoplasm of prostate: Secondary | ICD-10-CM | POA: Diagnosis not present

## 2021-03-03 LAB — PSA: Prostate Specific Ag, Serum: 0.2 ng/mL (ref 0.0–4.0)

## 2021-03-07 ENCOUNTER — Encounter: Payer: Self-pay | Admitting: Podiatry

## 2021-03-07 ENCOUNTER — Other Ambulatory Visit: Payer: Self-pay

## 2021-03-07 ENCOUNTER — Ambulatory Visit: Payer: Medicare HMO | Admitting: Podiatry

## 2021-03-07 DIAGNOSIS — M2041 Other hammer toe(s) (acquired), right foot: Secondary | ICD-10-CM

## 2021-03-07 DIAGNOSIS — E119 Type 2 diabetes mellitus without complications: Secondary | ICD-10-CM | POA: Diagnosis not present

## 2021-03-07 DIAGNOSIS — E1149 Type 2 diabetes mellitus with other diabetic neurological complication: Secondary | ICD-10-CM

## 2021-03-07 DIAGNOSIS — L84 Corns and callosities: Secondary | ICD-10-CM

## 2021-03-07 DIAGNOSIS — M79675 Pain in left toe(s): Secondary | ICD-10-CM

## 2021-03-07 DIAGNOSIS — B351 Tinea unguium: Secondary | ICD-10-CM

## 2021-03-07 DIAGNOSIS — M79674 Pain in right toe(s): Secondary | ICD-10-CM | POA: Diagnosis not present

## 2021-03-07 DIAGNOSIS — M2042 Other hammer toe(s) (acquired), left foot: Secondary | ICD-10-CM | POA: Diagnosis not present

## 2021-03-07 NOTE — Progress Notes (Signed)
ANNUAL DIABETIC FOOT EXAM  Subjective: Jordan [name]. presents today for for annual diabetic foot examination.  Patient relates 20 year h/o diabetes.  Patient denies any h/o foot wounds.  Patient denies any numbness, tingling, burning, or pins/needle sensation in feet. He does relates sharp/shooting pain on occasion. Also relates his feet feel cold at times.  Patient's blood sugar was 99 mg/dl last time he checked it. Patient does not monitor blood glucose daily.  Iona Beard, MD is patient's PCP. Last visit was 11/14/2020.  Past Medical History:  Diagnosis Date   Arthritis    all over oa   CAD (coronary artery disease)    Cancer ([place])    Prostate   Colon polyps 01/23/2010   Descending   Diverticulosis    dm type 2    Eczema 09/05/2020   Gastric ulcer 2010   GI bleed 2010   Hyperlipidemia    Hypertension    Hypotension    transient   Neuropathy 09/05/2020   both hands and feet   OSA (obstructive sleep apnea)    Severe, on CPAP therapy   Prostate cancer ([place])    Wears glasses 09/05/2020   Patient Active Problem List   Diagnosis Date Noted   Elevated PSA 11/30/2020   Malignant neoplasm of prostate ([place]) 06/21/2020   Bilateral hand pain 06/17/2016   Neck pain 06/17/2016   Type 2 diabetes mellitus with other circulatory complications ([place]) 95/62/1308   History of gastric ulcer 05/23/2014   OSA on CPAP 03/04/2013   Morbid obesity ([place]) 03/04/2013   CAD (coronary artery disease) 03/04/2013   Hyperlipidemia with target LDL less than 70 03/04/2013   Type 2 diabetes mellitus ([place]) 03/04/2013   GERD (gastroesophageal reflux disease) 03/04/2013   Vertigo 03/04/2013   Acute gastric ulcer 03/28/2009   Past Surgical History:  Procedure Laterality Date   CARDIAC CATHETERIZATION  05/01/2007   Recommend rotational atherectomy followed by PTCA and probable stenting of LAD.   CARDIAC CATHETERIZATION  05/28/2007   Mid LAD hihgh grade 80-90% stenosis, stented with a 3x20mm  Cypher stent implanted at 16atm, postdilated with a 3.25x54mm Quantum at 16atm, which revealed excellent wall appoistion - 5.26mm   CARDIOVASCULAR STRESS TEST  02/05/2011   Perfusion defect seen in inferior myocardial region consistent with diagphragmatic attenuation. Remaining myocardium demonstrates normal myocardial perfusion with no evidence of ischemia or infarct. ECG is positive for ischemia.   colonscopy  2012   CPAP/BIPAP SLEEP STUDY  07/01/2007   AHI-0.5/hr at 11cm water pressure, RDI-12.6/hr   CYSTOSCOPY  09/08/2020   Procedure: CYSTOSCOPY FLEXIBLE;  Surgeon: Irine Seal, MD;  Location: [facility];  Service: Urology;;  NO SEEDS FOUND IN BLADDER   LOWER ARTERIAL DOPPLER  06/03/2007   No evidence of dissection, AV fistula, or active pseudoaneurysm.   NASAL ENDOSCOPY  12/2008   RADIOACTIVE SEED IMPLANT N/A 09/08/2020   Procedure: RADIOACTIVE SEED IMPLANT/BRACHYTHERAPY IMPLANT;  Surgeon: Irine Seal, MD;  Location: [facility];  Service: Urology;  Laterality: N/A;  51 SEEDS IMPLANTED   SLEEP STUDY  05/13/2007   AHI-11.52/hr, AHI REM-56.0/hr, RDI-11.7/hr, RDI REM-56.0/hr, avg oxygen sat-94%   SPACE OAR INSTILLATION N/A 09/08/2020   Procedure: SPACE OAR INSTILLATION;  Surgeon: Irine Seal, MD;  Location: [facility];  Service: Urology;  Laterality: N/A;   stent to heart x 1  2010   TRANSTHORACIC ECHOCARDIOGRAM  03/13/2010   EF >65%, normal diastolic function, mild MR, mild TR, and normal RVSP  Current Outpatient Medications on File Prior to Visit  Medication Sig Dispense Refill   ACCU-CHEK GUIDE test strip TEST AS DIRECTED ONCEODAILY.M     acetaminophen (TYLENOL) 650 MG CR tablet Take 650 mg by mouth every 8 (eight) hours. Pt takes 2 tablets in the morning and 2 tablets in the evening     Apoaequorin (PREVAGEN PO) Take 20 mg by mouth daily.     aspirin 81 MG tablet Take 81 mg by mouth daily.     calcium carbonate (OSCAL) 1500 (600 Ca) MG TABS  tablet Take 1,500 mg by mouth 2 (two) times daily with a meal.     carvedilol (COREG) 3.125 MG tablet TAKE 1 TABLET EVERY DAY (NEED MD APPOINTMENT FOR REFILLS) (Patient taking differently: daily.) 90 tablet 3   Cholecalciferol (VITAMIN D3) 25 MCG (1000 UT) CAPS Take 1 capsule by mouth 2 (two) times daily.     CINNAMON PO Take 2 tablets by mouth 2 (two) times daily. 1000 mg tablet     ezetimibe (ZETIA) 10 MG tablet TAKE ONE TABLET BY MOUTH ONCE DAILY. 90 tablet 3   FIBER ADULT GUMMIES PO Take by mouth at bedtime. 2 or 3 tabs per day     FLUZONE HIGH-DOSE QUADRIVALENT 0.7 ML SUSY      HYDROcodone-acetaminophen (NORCO/VICODIN) 5-325 MG tablet Take 1 tablet by mouth every 6 (six) hours as needed for moderate pain. 6 tablet 0   ketoconazole (NIZORAL) 2 % cream Apply 1 application topically daily.     lisinopril (ZESTRIL) 5 MG tablet TAKE 1/2 TABLET EVERY DAY (NEED MD APPOINTMENT FOR REFILLS) (Patient taking differently: daily.) 45 tablet 3   metFORMIN (GLUCOPHAGE) 500 MG tablet 500 mg daily with breakfast.     MODERNA COVID-19 BIVAL BOOSTER 50 MCG/0.5ML injection      MODERNA COVID-19 VACCINE 100 MCG/0.5ML injection      NON FORMULARY CPAP     pantoprazole (PROTONIX) 40 MG tablet TAKE 1 TABLET EVERY DAY 90 tablet 0   psyllium (METAMUCIL) 58.6 % packet Take by mouth daily. 1 Tablespoon daily     simvastatin (ZOCOR) 40 MG tablet TAKE (1) TABLET BY MOUTH AT BEDTIME. 90 tablet 1   No current facility-administered medications on file prior to visit.    Allergies  Allergen Reactions   Codeine     Rash itching   Statins Other (See Comments)    Muscle aches Muscle aches   Social History   Occupational History   Occupation: retired  Tobacco Use   Smoking status: Former    Packs/day: 0.50    Years: 16.00    Pack years: 8.00    Types: Cigarettes    Quit date: 01/28/1978    Years since quitting: 43.1   Smokeless tobacco: Never  Vaping Use   Vaping Use: Never used  Substance and Sexual  Activity   Alcohol use: Not Currently   Drug use: No   Sexual activity: Yes   Family History  Problem Relation Age of Onset   Stroke Brother    Hypertension Brother    Alzheimer's disease Maternal Grandmother    Stroke Maternal Grandfather    Heart disease Maternal Grandfather    Stroke Paternal Grandmother    Heart attack Paternal Grandfather    Hypertension Father    Alzheimer's disease Father    Parkinson's disease Father    Alzheimer's disease Mother    Pancreatic cancer Maternal Uncle    Breast cancer Neg Hx    Prostate cancer Neg Hx  Colon cancer Neg Hx    Immunization History  Administered Date(s) Administered   Influenza-Unspecified 12/26/2017, 11/08/2020   Moderna Covid-19 Vaccine Bivalent Booster 80yrs & up 12/06/2020     Review of Systems: Negative except as noted in the HPI.   Objective: There were no vitals filed for this visit.  Jordan [name]. is a pleasant 76 y.o. male in NAD. AAO X 3.  Vascular Examination: CFT <3 seconds b/l LE. Palpable DP/PT pulses b/l LE. Digital hair absent b/l. Skin temperature gradient WNL b/l. No pain with calf compression b/l. No edema noted b/l. No cyanosis or clubbing noted b/l LE.  Dermatological Examination: Pedal integument with normal turgor, texture and tone b/l LE. No open wounds b/l. No interdigital macerations b/l. Toenails 1-5 b/l elongated, thickened, discolored with subungual debris. +Tenderness with dorsal palpation of nailplates. Hyperkeratotic lesion(s) noted bilateral 2nd toes and submet head 1 b/l.  Musculoskeletal Examination: Muscle strength 5/5 to all lower extremity muscle groups bilaterally. Hammertoe deformity noted 2-5 b/l. Utilizes cane for ambulation assistance.  Footwear Assessment: Does the patient wear appropriate shoes? Yes. Does the patient need inserts/orthotics? Yes.  Neurological Examination: Protective sensation intact 5/5 intact bilaterally with 10g monofilament b/l.  Assessment: 1.  Pain due to onychomycosis of toenails of both feet   2. Corns and callosities   3. Acquired hammertoes of both feet   4. Type II diabetes mellitus with neurological manifestations ([place])   5. Encounter for diabetic foot exam ([place])     ADA Risk Categorization: Low Risk :  Patient has all of the following: Intact protective sensation No prior foot ulcer  No severe deformity Pedal pulses present  Plan: -No new findings. No new orders. -Continue foot and shoe inspections daily. Monitor blood glucose per PCP/Endocrinologist's recommendations. -Mycotic toenails 1-5 bilaterally were debrided in length and girth with sterile nail nippers and dremel without incident. -Corn(s) bilateral 2nd toes and callus(es) submet head 1 b/l were pared utilizing sterile scalpel blade without incident. Total number debrided =4. -Patient/POA to call should there be question/concern in the interim.  Return in about 3 months (around 06/05/2021).  Marzetta Board, DPM

## 2021-03-08 ENCOUNTER — Ambulatory Visit: Payer: Medicare HMO | Admitting: Urology

## 2021-03-08 ENCOUNTER — Encounter: Payer: Self-pay | Admitting: Urology

## 2021-03-08 VITALS — BP 132/75 | HR 64 | Wt 277.0 lb

## 2021-03-08 DIAGNOSIS — N403 Nodular prostate with lower urinary tract symptoms: Secondary | ICD-10-CM | POA: Diagnosis not present

## 2021-03-08 DIAGNOSIS — R310 Gross hematuria: Secondary | ICD-10-CM | POA: Diagnosis not present

## 2021-03-08 DIAGNOSIS — N5201 Erectile dysfunction due to arterial insufficiency: Secondary | ICD-10-CM | POA: Diagnosis not present

## 2021-03-08 DIAGNOSIS — R351 Nocturia: Secondary | ICD-10-CM | POA: Diagnosis not present

## 2021-03-08 DIAGNOSIS — R972 Elevated prostate specific antigen [PSA]: Secondary | ICD-10-CM

## 2021-03-08 DIAGNOSIS — C61 Malignant neoplasm of prostate: Secondary | ICD-10-CM

## 2021-03-08 DIAGNOSIS — R3915 Urgency of urination: Secondary | ICD-10-CM | POA: Diagnosis not present

## 2021-03-08 DIAGNOSIS — N200 Calculus of kidney: Secondary | ICD-10-CM | POA: Diagnosis not present

## 2021-03-08 LAB — URINALYSIS, ROUTINE W REFLEX MICROSCOPIC
Bilirubin, UA: NEGATIVE
Glucose, UA: NEGATIVE
Ketones, UA: NEGATIVE
Leukocytes,UA: NEGATIVE
Nitrite, UA: NEGATIVE
Protein,UA: NEGATIVE
Specific Gravity, UA: 1.025 (ref 1.005–1.030)
Urobilinogen, Ur: 0.2 mg/dL (ref 0.2–1.0)
pH, UA: 6 (ref 5.0–7.5)

## 2021-03-08 LAB — MICROSCOPIC EXAMINATION
Bacteria, UA: NONE SEEN
Epithelial Cells (non renal): NONE SEEN /hpf (ref 0–10)
Renal Epithel, UA: NONE SEEN /hpf
WBC, UA: NONE SEEN /hpf (ref 0–5)

## 2021-03-08 MED ORDER — CIPROFLOXACIN HCL 500 MG PO TABS
500.0000 mg | ORAL_TABLET | Freq: Once | ORAL | Status: AC
  Administered 2021-03-08: 500 mg via ORAL

## 2021-03-08 MED ORDER — DEGARELIX ACETATE 80 MG ~~LOC~~ SOLR
80.0000 mg | Freq: Once | SUBCUTANEOUS | Status: AC
  Administered 2021-03-08: 80 mg via SUBCUTANEOUS

## 2021-03-08 MED ORDER — TAMSULOSIN HCL 0.4 MG PO CAPS: 0.4000 mg | ORAL_CAPSULE | Freq: Every day | ORAL | 3 refills | Status: DC

## 2021-03-08 NOTE — Progress Notes (Signed)
Subjective: 1. Prostate cancer (Jordan [name])   2. Elevated PSA   3. Nodular prostate with lower urinary tract symptoms   4. Urgency of urination   5. Nocturia   6. Erectile dysfunction due to arterial insufficiency   7. Renal stones   8. Gross hematuria     03/08/21: Jordan [name] returns today in f/u.  He got his last firmagon on 02/01/21 and is due today and will continue it through the end of the year.  His PSA is down further to 0.2.  He remains on tamsulosin and if voiding well but he has nocturia 2-3x.  His IPSS is 11.  He has had no further gross hematuria.  He had a CT hematuria study on 02/12/21 that showed some small stones but no other concerning findings.  He is for cystoscopy today.   He has no GI complaints but is on a stool softener.  He has no weight loss or bone pain.  He has occasional hot flashes 2-3x daily.    11/30/20: Jordan [name] returns today for the history noted below.  His PSA is 1.3.  He completed EXRT on 11/07/20.  He remains on firmagon and is due for an injection today.  He has worsening LUTS with an IPSS of 17.  He was started on tamsulosin by Dr. Tammi Klippel but he still has nocturia x 3 and urgency.  He has no GI complaints but is on metamucil and is using some miralax to help reduce the need to strain.  He has no bone pain or weight loss but has neuropathy and arthritis.  He has a few hot flashes.   08/03/20: Jordan [name] returns today in f/u.  He is a month out from firmagon 240mg  and his PSA is down to 1.2 with a T of 22.  He is scheduled for a seed implant with [employer] on 09/08/20.  He is tolerating the injection well  with just mild hot flashes. He is voiding well with an IPSS of 2.  His UA is unremarkable.   06/29/20:  Jordan [name] returns today to discuss LT-ADT as a component of treatment of his Gleason 8 high volume prostate cancer.   I have reviewed the risks and side effects ADT in detail and will initiate firmagon today and continue that monthly.    06/08/20: Jordan [name] returns today in f/u to  discuss the results of his prostate biopsy and staging studies.  He was found to have a 35.24ml prostate with 6/6 right cores positive for cancer.  5/6 were GG4 with up to 90% involvement and 1/6 was GG3.  He has high risk disease and has undergone staging with bone scan and CT with both being negative.  He has stage T2b N0 M0 cancer.  He has DM, CAD and HTN as well as morbid obesity but swims regularly and is compliant with his meds.  He should have a reasonable 5 year life expectancy.   Hx: He  is a 76 yo male who is sent by [name] for an increase in his PSA from 1.7 on 02/02/18 to 3.58 on 02/15/20.  He is voiding well with nocturia 1-2x.  His IPSS is 1. He has a good stream but it sprays a bit.  He has ED but is no longer responding to sildenafil.  He has had no UTI's or GU surgery.  His father had some prostate issues, possibly cancer, but he had other medical issues so he was not felt to be a biopsy candidate.  IPSS     Row Name 03/08/21 0900         International Prostate Symptom Score   How often have you had the sensation of not emptying your bladder? About half the time     How often have you had to urinate less than every two hours? Less than half the time     How often have you found you stopped and started again several times when you urinated? Not at All     How often have you found it difficult to postpone urination? Less than 1 in 5 times     How often have you had a weak urinary stream? Less than half the time     How often have you had to strain to start urination? Not at All     How many times did you typically get up at night to urinate? 3 Times     Total IPSS Score 11       Quality of Life due to urinary symptoms   If you were to spend the rest of your life with your urinary condition just the way it is now how would you feel about that? Mostly Satisfied               ROS:  ROS  Allergies  Allergen Reactions   Codeine     Rash itching   Statins Other (See  Comments)    Muscle aches Muscle aches    Past Medical History:  Diagnosis Date   Arthritis    all over oa   CAD (coronary artery disease)    Cancer ([place])    Prostate   Colon polyps 01/23/2010   Descending   Diverticulosis    dm type 2    Eczema 09/05/2020   Gastric ulcer 2010   GI bleed 2010   Hyperlipidemia    Hypertension    Hypotension    transient   Neuropathy 09/05/2020   both hands and feet   OSA (obstructive sleep apnea)    Severe, on CPAP therapy   Prostate cancer (Jordan [name])    Wears glasses 09/05/2020    Past Surgical History:  Procedure Laterality Date   CARDIAC CATHETERIZATION  05/01/2007   Recommend rotational atherectomy followed by PTCA and probable stenting of LAD.   CARDIAC CATHETERIZATION  05/28/2007   Mid LAD hihgh grade 80-90% stenosis, stented with a 3x38mm Cypher stent implanted at 16atm, postdilated with a 3.25x44mm Quantum at 16atm, which revealed excellent wall appoistion - 5.61mm   CARDIOVASCULAR STRESS TEST  02/05/2011   Perfusion defect seen in inferior myocardial region consistent with diagphragmatic attenuation. Remaining myocardium demonstrates normal myocardial perfusion with no evidence of ischemia or infarct. ECG is positive for ischemia.   colonscopy  2012   CPAP/BIPAP SLEEP STUDY  07/01/2007   AHI-0.5/hr at 11cm water pressure, RDI-12.6/hr   CYSTOSCOPY  09/08/2020   Procedure: CYSTOSCOPY FLEXIBLE;  Surgeon: Irine Seal, MD;  Location: [facility];  Service: Urology;;  NO SEEDS FOUND IN BLADDER   LOWER ARTERIAL DOPPLER  06/03/2007   No evidence of dissection, AV fistula, or active pseudoaneurysm.   NASAL ENDOSCOPY  12/2008   RADIOACTIVE SEED IMPLANT N/A 09/08/2020   Procedure: RADIOACTIVE SEED IMPLANT/BRACHYTHERAPY IMPLANT;  Surgeon: Irine Seal, MD;  Location: [facility];  Service: Urology;  Laterality: N/A;  51 SEEDS IMPLANTED   SLEEP STUDY  05/13/2007   AHI-11.52/hr, AHI REM-56.0/hr, RDI-11.7/hr, RDI  REM-56.0/hr, avg oxygen sat-94%   SPACE OAR  INSTILLATION N/A 09/08/2020   Procedure: SPACE OAR INSTILLATION;  Surgeon: Irine Seal, MD;  Location: [facility];  Service: Urology;  Laterality: N/A;   stent to heart x 1  2010   TRANSTHORACIC ECHOCARDIOGRAM  03/13/2010   EF >95%, normal diastolic function, mild MR, mild TR, and normal RVSP    Social History   Socioeconomic History   Marital status: Married    Spouse name: Not on file   Number of children: 2   Years of education: Not on file   Highest education level: Not on file  Occupational History   Occupation: retired  Tobacco Use   Smoking status: Former    Packs/day: 0.50    Years: 16.00    Pack years: 8.00    Types: Cigarettes    Quit date: 01/28/1978    Years since quitting: 43.1   Smokeless tobacco: Never  Vaping Use   Vaping Use: Never used  Substance and Sexual Activity   Alcohol use: Not Currently   Drug use: No   Sexual activity: Yes  Other Topics Concern   Not on file  Social History Narrative   Not on file   Social Determinants of Health   Financial Resource Strain: Low Risk    Difficulty of Paying Living Expenses: Not hard at all  Food Insecurity: No Food Insecurity   Worried About Charity fundraiser in the Last Year: Never true   [place] in the Last Year: Never true  Transportation Needs: No Transportation Needs   Lack of Transportation (Medical): No   Lack of Transportation (Non-Medical): No  Physical Activity: Inactive   Days of Exercise per Week: 0 days   Minutes of Exercise per Session: 0 min  Stress: No Stress Concern Present   Feeling of Stress : Not at all  Social Connections: Moderately Integrated   Frequency of Communication with Friends and Family: More than three times a week   Frequency of Social Gatherings with Friends and Family: Three times a week   Attends Religious Services: 1 to 4 times per year   Active Member of Clubs or Organizations: No   Attends Arts development officer: Never   Marital Status: Married  Human resources officer Violence: Not At Risk   Fear of Current or Ex-Partner: No   Emotionally Abused: No   Physically Abused: No   Sexually Abused: No    Family History  Problem Relation Age of Onset   Stroke Brother    Hypertension Brother    Alzheimer's disease Maternal Grandmother    Stroke Maternal Grandfather    Heart disease Maternal Grandfather    Stroke Paternal Grandmother    Heart attack Paternal Grandfather    Hypertension Father    Alzheimer's disease Father    Parkinson's disease Father    Alzheimer's disease Mother    Pancreatic cancer Maternal Uncle    Breast cancer Neg Hx    Prostate cancer Neg Hx    Colon cancer Neg Hx     Anti-infectives: Anti-infectives (From admission, onward)    Start     Dose/Rate Route Frequency Ordered Stop   03/08/21 1030  CIPROFLOXACIN HCL 500 MG PO TABS        500 mg Oral  Once 03/08/21 1018 03/08/21 1201       Current Outpatient Medications  Medication Sig Dispense Refill   ACCU-CHEK GUIDE test strip TEST AS DIRECTED ONCEODAILY.M     acetaminophen (TYLENOL) 650 MG CR tablet  Take 650 mg by mouth every 8 (eight) hours. Pt takes 2 tablets in the morning and 2 tablets in the evening     Apoaequorin (PREVAGEN PO) Take 20 mg by mouth daily.     aspirin 81 MG tablet Take 81 mg by mouth daily.     calcium carbonate (OSCAL) 1500 (600 Ca) MG TABS tablet Take 1,500 mg by mouth 2 (two) times daily with a meal.     carvedilol (COREG) 3.125 MG tablet TAKE 1 TABLET EVERY DAY (NEED MD APPOINTMENT FOR REFILLS) (Patient taking differently: daily.) 90 tablet 3   Cholecalciferol (VITAMIN D3) 25 MCG (1000 UT) CAPS Take 1 capsule by mouth 2 (two) times daily.     CINNAMON PO Take 2 tablets by mouth 2 (two) times daily. 1000 mg tablet     ezetimibe (ZETIA) 10 MG tablet TAKE ONE TABLET BY MOUTH ONCE DAILY. 90 tablet 3   FIBER ADULT GUMMIES PO Take by mouth at bedtime. 2 or 3 tabs per day      FLUZONE HIGH-DOSE QUADRIVALENT 0.7 ML SUSY      HYDROcodone-acetaminophen (NORCO/VICODIN) 5-325 MG tablet Take 1 tablet by mouth every 6 (six) hours as needed for moderate pain. 6 tablet 0   ketoconazole (NIZORAL) 2 % cream Apply 1 application topically daily.     lisinopril (ZESTRIL) 5 MG tablet TAKE 1/2 TABLET EVERY DAY (NEED MD APPOINTMENT FOR REFILLS) (Patient taking differently: daily.) 45 tablet 3   metFORMIN (GLUCOPHAGE) 500 MG tablet 500 mg daily with breakfast.     MODERNA COVID-19 BIVAL BOOSTER 50 MCG/0.5ML injection      MODERNA COVID-19 VACCINE 100 MCG/0.5ML injection      NON FORMULARY CPAP     pantoprazole (PROTONIX) 40 MG tablet TAKE 1 TABLET EVERY DAY 90 tablet 0   psyllium (METAMUCIL) 58.6 % packet Take by mouth daily. 1 Tablespoon daily     simvastatin (ZOCOR) 40 MG tablet TAKE (1) TABLET BY MOUTH AT BEDTIME. 90 tablet 1   tamsulosin (FLOMAX) 0.4 MG CAPS capsule Take 1 capsule (0.4 mg total) by mouth daily after supper. 90 capsule 3   No current facility-administered medications for this visit.     Objective: Vital signs in last 24 hours: BP 132/75    Pulse 64    Wt 277 lb (125.6 kg)    BMI 43.38 kg/m   Intake/Output from previous day: No intake/output data recorded. Intake/Output this shift: @IOTHISSHIFT @   Physical Exam   Recent Results (from the past 2160 hour(s))  Urinalysis, Routine w reflex microscopic     Status: Abnormal   Collection Time: 01/25/21  3:25 PM  Result Value Ref Range   Specific Gravity, UA 1.025 1.005 - 1.030   pH, UA 5.5 5.0 - 7.5   Color, UA Yellow Yellow   Appearance Ur Clear Clear   Leukocytes,UA Negative Negative   Protein,UA 1+ (A) Negative/Trace   Glucose, UA Negative Negative   Ketones, UA Trace (A) Negative   RBC, UA 3+ (A) Negative   Bilirubin, UA Negative Negative   Urobilinogen, Ur 1.0 0.2 - 1.0 mg/dL   Nitrite, UA Negative Negative   Microscopic Examination See below:   Urine Culture     Status: None   Collection  Time: 01/25/21  3:25 PM   Specimen: Urine   Urine  Result Value Ref Range   Urine Culture, Routine Final report    Organism ID, Bacteria No growth   Microscopic Examination     Status: Abnormal  Collection Time: 01/25/21  3:25 PM   Urine  Result Value Ref Range   WBC, UA None seen 0 - 5 /hpf   RBC >30 (A) 0 - 2 /hpf   Epithelial Cells (non renal) 0-10 0 - 10 /hpf   Renal Epithel, UA None seen None seen /hpf   Mucus, UA Present Not Estab.   Bacteria, UA None seen None seen/Few  I-STAT creatinine     Status: None   Collection Time: 02/12/21  9:46 AM  Result Value Ref Range   Creatinine, Ser 0.80 0.61 - 1.24 mg/dL  PSA     Status: None   Collection Time: 03/02/21 10:43 AM  Result Value Ref Range   Prostate Specific Ag, Serum 0.2 0.0 - 4.0 ng/mL    Comment: Roche ECLIA methodology. According to the American Urological Association, Serum PSA should decrease and remain at undetectable levels after radical prostatectomy. The AUA defines biochemical recurrence as an initial PSA value 0.2 ng/mL or greater followed by a subsequent confirmatory PSA value 0.2 ng/mL or greater. Values obtained with different assay methods or kits cannot be used interchangeably. Results cannot be interpreted as absolute evidence of the presence or absence of malignant disease.   Urinalysis, Routine w reflex microscopic     Status: Abnormal   Collection Time: 03/08/21 12:01 PM  Result Value Ref Range   Specific Gravity, UA 1.025 1.005 - 1.030   pH, UA 6.0 5.0 - 7.5   Color, UA Yellow Yellow   Appearance Ur Clear Clear   Leukocytes,UA Negative Negative   Protein,UA Negative Negative/Trace   Glucose, UA Negative Negative   Ketones, UA Negative Negative   RBC, UA Trace (A) Negative   Bilirubin, UA Negative Negative   Urobilinogen, Ur 0.2 0.2 - 1.0 mg/dL   Nitrite, UA Negative Negative   Microscopic Examination See below:   Microscopic Examination     Status: None   Collection Time: 03/08/21 12:01 PM    Urine  Result Value Ref Range   WBC, UA None seen 0 - 5 /hpf   RBC 0-2 0 - 2 /hpf   Epithelial Cells (non renal) None seen 0 - 10 /hpf   Renal Epithel, UA None seen None seen /hpf   Bacteria, UA None seen None seen/Few      Studies/Results: I have reviewed the CT films and report.  Procedure: Cystoscopy.  The penis was prepped with betadine and 2% lidocaine jelly was instilled.  He was given Cipro 500mg  po.  The flexible scope was passed.  The urethra is normal.  The external sphincter is intact.  The prostate is about 2cm with bilobar hyperplasia with some coaptation but no clear radiation changes.  The bladder has mild trabeculation without mucosal lesions.  The UO's are unremarkable.     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   He is tolerating firmagon and will continue that monthly for 96mo.   He tolerated the EXRT but has some increased OAB symptoms.  His PSA is down to 0.2.  I will have him return in 3 months with labs.    BPH with BOO and OAB.   He is doing well on tamsulosin which I have refilled.  Gross hematuria.    He has small renal stones but no other obvious cause for hematuria.       Meds ordered this encounter  Medications   tamsulosin (FLOMAX) 0.4 MG CAPS capsule    Sig: Take 1 capsule (0.4 mg  total) by mouth daily after supper.    Dispense:  90 capsule    Refill:  3   degarelix (FIRMAGON) injection 80 mg   ciprofloxacin (CIPRO) tablet 500 mg     Orders Placed This Encounter  Procedures   Microscopic Examination   Urinalysis, Routine w reflex microscopic   PSA    Standing Status:   Future    Standing Expiration Date:   09/05/2021   Testosterone    Standing Status:   Future    Standing Expiration Date:   09/05/2021     Return in about 3 months (around 06/06/2021) for firmagon monthly unil 11/23 and f/u with me in 3 months with a PSA. Marland Kitchen    CC: Dr. Iona Beard, Dr. Ledon Snare  and Dr. Shelva Majestic.    Irine Seal 03/09/2021 [phone] ID: Jordan [name] Resides., male   DOB: 12-Apr-1945, 76 y.o.   MRN: [id number]

## 2021-03-08 NOTE — Progress Notes (Signed)
Firmagon Sub Q Injection  Due to Prostate Cancer patient is present today for a Firmagon Injection.   Medication: Mills Koller (Degarelix)  Dose: 80mg  Location: right upper abdomen Lot: [id number]  Exp: [id number]  Patient tolerated well, no complications were noted  Performed by: Estill Bamberg RN  Follow up: 1 month firmagon

## 2021-03-08 NOTE — Progress Notes (Signed)
Urological Symptom Review  Patient is experiencing the following symptoms: Frequent urination Hard to postpone urination Get up at night to urinate Blood in urine Erection problems (male only)   Review of Systems  Gastrointestinal (upper)  : Negative for upper GI symptoms  Gastrointestinal (lower) : Negative for lower GI symptoms  Constitutional : Fatigue  Skin: Negative for skin symptoms  Eyes: Negative for eye symptoms  Ear/Nose/Throat : Sinus problems  Hematologic/Lymphatic: Negative for Hematologic/Lymphatic symptoms  Cardiovascular : Negative for cardiovascular symptoms  Respiratory : Negative for respiratory symptoms  Endocrine: Negative for endocrine symptoms  Musculoskeletal: Negative for musculoskeletal symptoms  Neurological: Negative for neurological symptoms  Psychologic: Negative for psychiatric symptoms

## 2021-04-05 ENCOUNTER — Ambulatory Visit (INDEPENDENT_AMBULATORY_CARE_PROVIDER_SITE_OTHER): Payer: Medicare HMO

## 2021-04-05 ENCOUNTER — Other Ambulatory Visit: Payer: Self-pay

## 2021-04-05 DIAGNOSIS — C61 Malignant neoplasm of prostate: Secondary | ICD-10-CM | POA: Diagnosis not present

## 2021-04-05 MED ORDER — DEGARELIX ACETATE 80 MG ~~LOC~~ SOLR
80.0000 mg | Freq: Once | SUBCUTANEOUS | Status: AC
  Administered 2021-04-05: 80 mg via SUBCUTANEOUS

## 2021-04-05 NOTE — Progress Notes (Signed)
Firmagon Sub Q Injection   Due to Prostate Cancer patient is present today for a Firmagon Injection.    Medication: Mills Koller (Degarelix)  Dose: 80mg  Location: left upper abdomen Lot: [id number]   Exp: [id number]   Patient tolerated well, no complications were noted   Performed by: Kree Armato LPN   Follow up: 1 month firmagon

## 2021-05-03 ENCOUNTER — Other Ambulatory Visit: Payer: Self-pay

## 2021-05-03 ENCOUNTER — Ambulatory Visit (INDEPENDENT_AMBULATORY_CARE_PROVIDER_SITE_OTHER): Payer: Medicare HMO

## 2021-05-03 DIAGNOSIS — C61 Malignant neoplasm of prostate: Secondary | ICD-10-CM | POA: Diagnosis not present

## 2021-05-03 MED ORDER — DEGARELIX ACETATE 80 MG ~~LOC~~ SOLR
80.0000 mg | Freq: Once | SUBCUTANEOUS | Status: AC
  Administered 2021-05-03: 12:00:00 80 mg via SUBCUTANEOUS

## 2021-05-07 ENCOUNTER — Telehealth: Payer: Self-pay

## 2021-05-07 NOTE — Telephone Encounter (Signed)
Patient called with no answer. Message left to return call to office. 

## 2021-05-07 NOTE — Telephone Encounter (Signed)
Patient needing  a call back on medication paperwork. ?Wanting to speak with Hope. ? ?Please advise. ? ?Call: [phone] ? ?Thanks, ?Helene Kelp ?

## 2021-05-09 NOTE — Telephone Encounter (Signed)
Spoke with patient on 05/08/21 and all questions and concerns addressed. ?

## 2021-05-21 ENCOUNTER — Encounter: Payer: Self-pay | Admitting: Cardiovascular Disease

## 2021-05-21 ENCOUNTER — Ambulatory Visit: Payer: Medicare HMO | Admitting: Cardiovascular Disease

## 2021-05-21 ENCOUNTER — Other Ambulatory Visit: Payer: Self-pay

## 2021-05-21 VITALS — BP 114/58 | HR 62 | Ht 66.0 in | Wt 276.8 lb

## 2021-05-21 DIAGNOSIS — E1159 Type 2 diabetes mellitus with other circulatory complications: Secondary | ICD-10-CM | POA: Diagnosis not present

## 2021-05-21 DIAGNOSIS — I2584 Coronary atherosclerosis due to calcified coronary lesion: Secondary | ICD-10-CM

## 2021-05-21 DIAGNOSIS — G4733 Obstructive sleep apnea (adult) (pediatric): Secondary | ICD-10-CM | POA: Diagnosis not present

## 2021-05-21 DIAGNOSIS — I2510 Atherosclerotic heart disease of native coronary artery without angina pectoris: Secondary | ICD-10-CM | POA: Diagnosis not present

## 2021-05-21 DIAGNOSIS — E785 Hyperlipidemia, unspecified: Secondary | ICD-10-CM | POA: Diagnosis not present

## 2021-05-21 DIAGNOSIS — C61 Malignant neoplasm of prostate: Secondary | ICD-10-CM | POA: Diagnosis not present

## 2021-05-21 NOTE — Patient Instructions (Signed)
Medication Instructions:  ?No changes ?*If you need a refill on your cardiac medications before your next appointment, please call your pharmacy* ? ? ?Lab Work: ?None ordered ?If you have labs (blood work) drawn today and your tests are completely normal, you will receive your results only by: ?MyChart Message (if you have MyChart) OR ?A paper copy in the mail ?If you have any lab test that is abnormal or we need to change your treatment, we will call you to review the results. ? ? ?Testing/Procedures: ?None ordered ? ? ?Follow-Up: ?At [facility], you and your health needs are our priority.  As part of our continuing mission to provide you with exceptional heart care, we have created designated Provider Care Teams.  These Care Teams include your primary Cardiologist (physician) and Advanced Practice Providers (APPs -  Physician Assistants and Nurse Practitioners) who all work together to provide you with the care you need, when you need it. ? ?We recommend signing up for the patient portal called "MyChart".  Sign up information is provided on this After Visit Summary.  MyChart is used to connect with patients for Virtual Visits (Telemedicine).  Patients are able to view lab/test results, encounter notes, upcoming appointments, etc.  Non-urgent messages can be sent to your provider as well.   ?To learn more about what you can do with MyChart, go to [email or web address].   ? ?Your next appointment:   ?12 month(s) ? ?The format for your next appointment:   ?In Person ? ?Provider:   ?Shelva Majestic, MD { ? ? ?

## 2021-05-21 NOTE — Progress Notes (Signed)
Patient ID: Jordan Krammes., male   DOB: [birth year], 76 y.o.   MRN: [id number] ? ? ?Primary MD: [name] ? ?HPI: Jordan [name]. is a 76 y.o. male who presents to the office today for a 12 month cardiology and pre-operative evaluation. ? ?[name] has a history of CAD , severe obstructive sleep apnea, morbid obesity, type 2 diabetes mellitus, mild essential hypertension, GERD, as well as erectile dysfunction. In April 2009 he was found to have high-grade focally calcified LAD stenosis and underwent successful high-speed rotational atherectomy and DES stenting with insertion of a 3.0?33 mm Cypher stent.  He has continued to be on long-term antiplatelet therapy with aspirin and Plavix.  He continues to be active.  He denies any recurrent anginal type symptomatology. His nuclear stress test in 2012 continue to suggest patency of this vessel with normal perfusion.  A two-year followup nuclear perfusion study  on 01/28/2013 demonstrated  normal perfusion without scar or ischemia;  EF was 56%. ? ?He has a history of severe  obstructive sleep apnea originally diagnosed in 2009 and he has been on CPAP therapy since that time with 100% compliance.  He recently received a new CPAP machine after his old machine had begun to malfunction.  He was recently placed on a nasal mask and is followed by Dr.Domeiher. ? ?He has a history of hyperlipidemia and when last seen had begun to have issues with simvastatin causing some myalgias.  I suggest that he change this to Crestor and he is now been on a very low-dose at 5 mg and has been taking this 2 times per week.  He has tolerated this.  Repeat blood work in May 2017 showed a total cholesterol of 153, triglycerides 80, HDL 45, and LDL 92. ? ?An echo Doppler study on 05/11/2015 showed an ejection fraction at 55%; there were no regional wall motion abnormalities but there was  grade 2 diastolic dysfunction.  There was mitral annular calcification without regurgitation.  He normal  pulmonic pressures.  His aortic valve was normal. ? ?In the past.  He has had bilateral knee discomfort and  underwent several injections as well as physical therapy for improvement. ? ?He continues to be followed Dr. Iona [name] , for primary care.  I  saw him in November 2018 at which time he was continuing to do well from a cardiac standpoint and was without chest pain or shortness of breath.  ? ?When I last saw him in October 2019 [name] continued to do well from a cardiac standpoint.  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  He continued to swim at least 5 days/week and 1/2 mile a time doing doggy paddle.  He does admit to some fatigability.  He continued to use CPAP with 100% compliance and uses a nasal mask.  He was on aspirin and Plavix for antiplatelet therapy, lisinopril 2.5 mg for hypertension.  He is diabetic on metformin.  He continued to take rosuvastatin 5 mg for hyperlipidemia and Protonix for GERD.   ? ?I saw him in November 2020.  At that time he was continuing to do well and had  purposeful weight loss losing over 45 pounds.  He continued to use CPAP with 100% compliance by Dr. Brett [name] for his sleep apnea.  In September 2020 lipid studies were excellent with a total cholesterol 113 HDL 49 LDL 51 and triglycerides 53.  Hemoglobin A1c was stable at 5.7 and he had normal renal function  with a creatinine of 0.84. ? ?I last saw him in March 2022 at which time he remained stable from a cardiovascular standpoint and denied any chest pain, PND, orthopnea or palpitations.  He was continuing to swim.  He is in need to undergo a prostate biopsy with Dr. Irine [name] to further evaluate a small nodule.  He was continuing to use CPAP. ? ?Since his last evaluation, he was diagnosed with prostate CA stage II and is followed by Dr. Jeffie [name].  He had undergone his radiation seed implant and has also had 25 radiation treatments.  He apparently stopped using CPAP therapy.  He states he is sleeping well.  He has  not has developed neuropathy of his hands and also has arthritic issues in his left knee.  He has not been swimming.  He denies any chest pain or palpitations.  An Epworth Sleepiness Scale score was calculated in the office today and this endorsed at 7 arguing against residual daytime sleepiness.  He presents for yearly evaluation. ? ?Past Medical History:  ?Diagnosis Date  ? Arthritis   ? all over oa  ? CAD (coronary artery disease)   ? Cancer [facility])   ? Prostate  ? Colon polyps 01/23/2010  ? Descending  ? Diverticulosis   ? dm type 2   ? Eczema 09/05/2020  ? Gastric ulcer 2010  ? GI bleed 2010  ? Hyperlipidemia   ? Hypertension   ? Hypotension   ? transient  ? Neuropathy 09/05/2020  ? both hands and feet  ? OSA (obstructive sleep apnea)   ? Severe, on CPAP therapy  ? Prostate cancer ([place])   ? Wears glasses 09/05/2020  ? ? ?Past Surgical History:  ?Procedure Laterality Date  ? CARDIAC CATHETERIZATION  05/01/2007  ? Recommend rotational atherectomy followed by PTCA and probable stenting of LAD.  ? CARDIAC CATHETERIZATION  05/28/2007  ? Mid LAD hihgh grade 80-90% stenosis, stented with a 3x67m Cypher stent implanted at 16atm, postdilated with a 3.25x274mQuantum at 16atm, which revealed excellent wall appoistion - 5.7928m? CARDIOVASCULAR STRESS TEST  02/05/2011  ? Perfusion defect seen in inferior myocardial region consistent with diagphragmatic attenuation. Remaining myocardium demonstrates normal myocardial perfusion with no evidence of ischemia or infarct. ECG is positive for ischemia.  ? colonscopy  2012  ? CPAP/BIPAP SLEEP STUDY  07/01/2007  ? AHI-0.5/hr at 11cm water pressure, RDI-12.6/hr  ? CYSTOSCOPY  09/08/2020  ? Procedure: CYSTOSCOPY FLEXIBLE;  Surgeon: Jordan [name];  [facility];;  NO SEEDS FOUND IN BLADDER  ? LOWER ARTERIAL DOPPLER  06/03/2007  ? No evidence of dissection, AV fistula, or active pseudoaneurysm.  ? NASAL ENDOSCOPY  12/2008  ? RADIOACTIVE SEED  IMPLANT N/A 09/08/2020  ? Procedure: RADIOACTIVE SEED IMPLANT/BRACHYTHERAPY IMPLANT;  Surgeon: Jordan [name];  [facility];  Laterality: N/A;  51 SEEDS IMPLANTED  ? SLEEP STUDY  05/13/2007  ? AHI-11.52/hr, AHI REM-56.0/hr, RDI-11.7/hr, RDI REM-56.0/hr, avg oxygen sat-94%  ? SPACE OAR INSTILLATION N/A 09/08/2020  ? Procedure: SPACE OAR INSTILLATION;  Surgeon: Jordan [name];  [facility];  Laterality: N/A;  ? stent to heart x 1  2010  ? TRANSTHORACIC ECHOCARDIOGRAM  03/13/2010  ? EF >55>50%ormal diastolic function, mild MR, mild TR, and normal RVSP  ? ? ?Allergies  ?Allergen Reactions  ? Codeine   ?  Rash itching  ? Statins Other (See Comments)  ?  Muscle aches ?Muscle aches  ? ? ?Current Outpatient Medications  ?Medication Sig Dispense Refill  ? ACCU-CHEK GUIDE test strip TEST AS DIRECTED ONCEODAILY.M    ? acetaminophen (TYLENOL) 650 MG CR tablet Take 650 mg by mouth every 8 (eight) hours. Pt takes 2 tablets in the morning and 2 tablets in the evening    ? Apoaequorin (PREVAGEN PO) Take 20 mg by mouth daily.    ? aspirin 81 MG tablet Take 81 mg by mouth daily.    ? calcium carbonate (OSCAL) 1500 (600 Ca) MG TABS tablet Take 1,500 mg by mouth 2 (two) times daily with a meal.    ? carvedilol (COREG) 3.125 MG tablet TAKE 1 TABLET EVERY DAY (NEED MD APPOINTMENT FOR REFILLS) (Patient taking differently: daily.) 90 tablet 3  ? Cholecalciferol (VITAMIN D3) 25 MCG (1000 UT) CAPS Take 1 capsule by mouth 2 (two) times daily.    ? CINNAMON PO Take 2 tablets by mouth 2 (two) times daily. 1000 mg tablet    ? ezetimibe (ZETIA) 10 MG tablet TAKE ONE TABLET BY MOUTH ONCE DAILY. 90 tablet 3  ? FLUZONE HIGH-DOSE QUADRIVALENT 0.7 ML SUSY     ? ketoconazole (NIZORAL) 2 % cream Apply 1 application topically daily.    ? lisinopril (ZESTRIL) 5 MG tablet TAKE 1/2 TABLET EVERY DAY (NEED MD APPOINTMENT FOR REFILLS) (Patient taking differently: daily.) 45  tablet 3  ? metFORMIN (GLUCOPHAGE) 500 MG tablet 500 mg daily with breakfast.    ? MODERNA COVID-19 BIVAL BOOSTER 50 MCG/0.5ML injection     ? MODERNA COVID-19 VACCINE 100 MCG/0.5ML injection     ? pantopra

## 2021-05-25 ENCOUNTER — Encounter: Payer: Self-pay | Admitting: Cardiovascular Disease

## 2021-05-28 NOTE — Progress Notes (Signed)
Firmagon Sub Q Injection ? ?Due to Prostate Cancer patient is present today for a Firmagon Injection.  ? ?Medication: Mills Koller (Degarelix)  ?Dose: '80mg'$  ?Location: right upper abdomen ?Lot: [id number] ?Exp: [id number] ? ?Patient tolerated well, no complications were noted ? ?Performed by: Levi Aland, CMA ? ?Follow up: Follow up as scheduled.  ?

## 2021-05-29 ENCOUNTER — Other Ambulatory Visit: Payer: Self-pay | Admitting: Gastroenterology

## 2021-06-01 ENCOUNTER — Other Ambulatory Visit: Payer: Medicare HMO

## 2021-06-01 DIAGNOSIS — C61 Malignant neoplasm of prostate: Secondary | ICD-10-CM

## 2021-06-02 LAB — PSA: Prostate Specific Ag, Serum: <0.1 ng/mL (ref 0.0–4.0)

## 2021-06-02 LAB — TESTOSTERONE: Testosterone: 17 ng/dL — ABNORMAL LOW (ref 264–916)

## 2021-06-07 ENCOUNTER — Encounter: Payer: Self-pay | Admitting: Urology

## 2021-06-07 ENCOUNTER — Ambulatory Visit: Payer: Medicare HMO | Admitting: Urology

## 2021-06-07 VITALS — BP 124/70 | HR 64

## 2021-06-07 DIAGNOSIS — R3915 Urgency of urination: Secondary | ICD-10-CM

## 2021-06-07 DIAGNOSIS — N403 Nodular prostate with lower urinary tract symptoms: Secondary | ICD-10-CM | POA: Diagnosis not present

## 2021-06-07 DIAGNOSIS — R351 Nocturia: Secondary | ICD-10-CM

## 2021-06-07 DIAGNOSIS — C61 Malignant neoplasm of prostate: Secondary | ICD-10-CM

## 2021-06-07 MED ORDER — DEGARELIX ACETATE 80 MG ~~LOC~~ SOLR
80.0000 mg | Freq: Once | SUBCUTANEOUS | Status: AC
  Administered 2021-06-07: 80 mg via SUBCUTANEOUS

## 2021-06-07 NOTE — Progress Notes (Signed)
Firmagon Sub Q Injection ? ?Due to Prostate Cancer patient is present today for a Firmagon Injection.  ? ?Medication: Mills Koller (Degarelix)  ?Dose: '80mg'$  ?Location: left upper abdomen ?Lot: [id number]  ?Exp: [id number] ? ?Patient tolerated well, no complications were noted ? ?Performed by: Estill Bamberg RN   ? ?Follow up: 1 month NV firmagon  ?

## 2021-06-07 NOTE — Progress Notes (Signed)
?Subjective: ?1. Prostate cancer ([place])   ?2. Nodular prostate with lower urinary tract symptoms   ?3. Urgency of urination   ?4. Nocturia   ? ?4/13//23: Jordan [name] returns today in f/u for the history below.  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  He will continue the ADT until 11/23.  His PSA <0.1 and his T is 17.  He has hot flashes but no other complaints.  His weight is stable.   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  His IPSS is 7 with nocturia x 2.  He has some urgency.  His UA is clear.  ? ?03/08/21: Jordan [name] returns today in f/u.  He got his last firmagon on 02/01/21 and is due today and will continue it through the end of the year.  His PSA is down further to 0.2.  He remains on tamsulosin and if voiding well but he has nocturia 2-3x.  His IPSS is 11.  He has had no further gross hematuria.  He had a CT hematuria study on 02/12/21 that showed some small stones but no other concerning findings.  He is for cystoscopy today.   He has no GI complaints but is on a stool softener.  He has no weight loss or bone pain.  He has occasional hot flashes 2-3x daily.   ? ?11/30/20: Jordan [name] returns today for the history noted below.  His PSA is 1.3.  He completed EXRT on 11/07/20.  He remains on firmagon and is due for an injection today.  He has worsening LUTS with an IPSS of 17.  He was started on tamsulosin by Dr. Tammi Klippel but he still has nocturia x 3 and urgency.  He has no GI complaints but is on metamucil and is using some miralax to help reduce the need to strain.  He has no bone pain or weight loss but has neuropathy and arthritis.  He has a few hot flashes.  ? ?08/03/20: Jordan [name] returns today in f/u.  He is a month out from firmagon '240mg'$  and his PSA is down to 1.2 with a T of 22.  He is scheduled for a seed implant with [employer] on 09/08/20.  He is tolerating the injection well  with just mild hot flashes. He is voiding well  with an IPSS of 2.  His UA is unremarkable.  ? ?06/29/20:  Jordan [name] returns today to discuss LT-ADT as a component of treatment of his Gleason 8 high volume prostate cancer.   I have reviewed the risks and side effects ADT in detail and will initiate firmagon today and continue that monthly.   ? ?06/08/20: Jordan [name] returns today in f/u to discuss the results of his prostate biopsy and staging studies.  He was found to have a 35.42m prostate with 6/6 right cores positive for cancer.  5/6 were GG4 with up to 90% involvement and 1/6 was GG3.  He has high risk disease and has undergone staging with bone scan and CT with both being negative.  He has stage T2b N0 M0 cancer.  He has DM, CAD and HTN as well as morbid obesity but swims regularly and is compliant with his meds.  He should have a reasonable 5 year life expectancy.  ? ?Hx: He  is a 76yo male who is sent by Dr. HBerdine Addisonfor an increase in his PSA from 1.7 on 02/02/18  to 3.58 on 02/15/20.  He is voiding well with nocturia 1-2x.  His IPSS is 1. He has a good stream but it sprays a bit.  He has ED but is no longer responding to sildenafil.  He has had no UTI's or GU surgery.  His father had some prostate issues, possibly cancer, but he had other medical issues so he was not felt to be a biopsy candidate.  ? ? IPSS   ? ? [place] Name 06/07/21 1600  ?  ?  ?  ? International Prostate Symptom Score  ? How often have you had the sensation of not emptying your bladder? Less than 1 in 5    ? How often have you had to urinate less than every two hours? Less than 1 in 5 times    ? How often have you found you stopped and started again several times when you urinated? Not at All    ? How often have you found it difficult to postpone urination? About half the time    ? How often have you had a weak urinary stream? Not at All    ? How often have you had to strain to start urination? Not at All    ? How many times did you typically get up at night to urinate? 2 Times    ? Total IPSS  Score 7    ?  ? Quality of Life due to urinary symptoms  ? If you were to spend the rest of your life with your urinary condition just the way it is now how would you feel about that? Mixed    ? ?  ?  ? ?  ? ? ? ?ROS: ? ?Review of Systems  ?HENT:  Positive for congestion.   ?Musculoskeletal:  Positive for joint pain.  ? ?Allergies  ?Allergen Reactions  ? Codeine   ?  Rash itching  ? Statins Other (See Comments)  ?  Muscle aches ?Muscle aches  ? ? ?Past Medical History:  ?Diagnosis Date  ? Arthritis   ? all over oa  ? CAD (coronary artery disease)   ? Cancer [facility])   ? Prostate  ? Colon polyps 01/23/2010  ? Descending  ? Diverticulosis   ? dm type 2   ? Eczema 09/05/2020  ? Gastric ulcer 2010  ? GI bleed 2010  ? Hyperlipidemia   ? Hypertension   ? Hypotension   ? transient  ? Neuropathy 09/05/2020  ? both hands and feet  ? OSA (obstructive sleep apnea)   ? Severe, on CPAP therapy  ? Prostate cancer ([place])   ? Wears glasses 09/05/2020  ? ? ?Past Surgical History:  ?Procedure Laterality Date  ? CARDIAC CATHETERIZATION  05/01/2007  ? Recommend rotational atherectomy followed by PTCA and probable stenting of LAD.  ? CARDIAC CATHETERIZATION  05/28/2007  ? Mid LAD hihgh grade 80-90% stenosis, stented with a 3x57m Cypher stent implanted at 16atm, postdilated with a 3.25x256mQuantum at 16atm, which revealed excellent wall appoistion - 5.7961m? CARDIOVASCULAR STRESS TEST  02/05/2011  ? Perfusion defect seen in inferior myocardial region consistent with diagphragmatic attenuation. Remaining myocardium demonstrates normal myocardial perfusion with no evidence of ischemia or infarct. ECG is positive for ischemia.  ? colonscopy  2012  ? CPAP/BIPAP SLEEP STUDY  07/01/2007  ? AHI-0.5/hr at 11cm water pressure, RDI-12.6/hr  ? CYSTOSCOPY  09/08/2020  ? Procedure: CYSTOSCOPY FLEXIBLE;  Surgeon: WreIrine SealD;  [facility]  [facility];;  NO SEEDS FOUND IN BLADDER  ? LOWER ARTERIAL DOPPLER  06/03/2007   ? No evidence of dissection, AV fistula, or active pseudoaneurysm.  ? NASAL ENDOSCOPY  12/2008  ? RADIOACTIVE SEED IMPLANT N/A 09/08/2020  ? Procedure: RADIOACTIVE SEED IMPLANT/BRACHYTHERAPY IMPLANT;  Surgeon: Irine Seal, MD;  [facility];  Service: Urology;  Laterality: N/A;  51 SEEDS IMPLANTED  ? SLEEP STUDY  05/13/2007  ? AHI-11.52/hr, AHI REM-56.0/hr, RDI-11.7/hr, RDI REM-56.0/hr, avg oxygen sat-94%  ? SPACE OAR INSTILLATION N/A 09/08/2020  ? Procedure: SPACE OAR INSTILLATION;  Surgeon: Irine Seal, MD;  Location[facility];  Service: Urology;  Laterality: N/A;  ? stent to heart x 1  2010  ? TRANSTHORACIC ECHOCARDIOGRAM  03/13/2010  ? EF >28%, normal diastolic function, mild MR, mild TR, and normal RVSP  ? ? ?Social History  ? ?Socioeconomic History  ? Marital status: Married  ?  Spouse name: Not on file  ? Number of children: 2  ? Years of education: Not on file  ? Highest education level: Not on file  ?Occupational History  ? Occupation: retired  ?Tobacco Use  ? Smoking status: Former  ?  Packs/day: 0.50  ?  Years: 16.00  ?  Pack years: 8.00  ?  Types: Cigarettes  ?  Quit date: 01/28/1978  ?  Years since quitting: 43.3  ? Smokeless tobacco: Never  ?Vaping Use  ? Vaping Use: Never used  ?Substance and Sexual Activity  ? Alcohol use: Not Currently  ? Drug use: No  ? Sexual activity: Yes  ?Other Topics Concern  ? Not on file  ?Social History Narrative  ? Not on file  ? ?Social Determinants of Health  ? ?Financial Resource Strain: Low Risk   ? Difficulty of Paying Living Expenses: Not hard at all  ?Food Insecurity: No Food Insecurity  ? Worried About Charity fundraiser in the Last Year: Never true  ? Ran Out of Food in the Last Year: Never true  ?Transportation Needs: No Transportation Needs  ? Lack of Transportation (Medical): No  ? Lack of Transportation (Non-Medical): No  ?Physical Activity: Inactive  ? Days of Exercise per Week: 0 days  ? Minutes of Exercise per  Session: 0 min  ?Stress: No Stress Concern Present  ? Feeling of Stress : Not at all  ?Social Connections: Moderately Integrated  ? Frequency of Communication with Friends and Family: More than three times a we

## 2021-06-08 LAB — URINALYSIS, ROUTINE W REFLEX MICROSCOPIC
Bilirubin, UA: NEGATIVE
Glucose, UA: NEGATIVE
Leukocytes,UA: NEGATIVE
Nitrite, UA: NEGATIVE
Protein,UA: NEGATIVE
RBC, UA: NEGATIVE
Specific Gravity, UA: >1.03 — ABNORMAL HIGH (ref 1.005–1.030)
Urobilinogen, Ur: 1 mg/dL (ref 0.2–1.0)
pH, UA: 5.5 (ref 5.0–7.5)

## 2021-06-12 ENCOUNTER — Ambulatory Visit: Payer: Medicare HMO | Admitting: Podiatry

## 2021-06-12 ENCOUNTER — Encounter: Payer: Self-pay | Admitting: Podiatry

## 2021-06-12 DIAGNOSIS — E1149 Type 2 diabetes mellitus with other diabetic neurological complication: Secondary | ICD-10-CM | POA: Diagnosis not present

## 2021-06-12 DIAGNOSIS — M79674 Pain in right toe(s): Secondary | ICD-10-CM | POA: Diagnosis not present

## 2021-06-12 DIAGNOSIS — M79675 Pain in left toe(s): Secondary | ICD-10-CM

## 2021-06-12 DIAGNOSIS — B351 Tinea unguium: Secondary | ICD-10-CM

## 2021-06-12 DIAGNOSIS — L84 Corns and callosities: Secondary | ICD-10-CM | POA: Diagnosis not present

## 2021-06-14 ENCOUNTER — Other Ambulatory Visit: Payer: Medicare HMO

## 2021-06-19 DIAGNOSIS — G4733 Obstructive sleep apnea (adult) (pediatric): Secondary | ICD-10-CM | POA: Diagnosis not present

## 2021-06-19 DIAGNOSIS — C61 Malignant neoplasm of prostate: Secondary | ICD-10-CM | POA: Diagnosis not present

## 2021-06-19 DIAGNOSIS — E1169 Type 2 diabetes mellitus with other specified complication: Secondary | ICD-10-CM | POA: Diagnosis not present

## 2021-06-19 DIAGNOSIS — M150 Primary generalized (osteo)arthritis: Secondary | ICD-10-CM | POA: Diagnosis not present

## 2021-06-19 DIAGNOSIS — R001 Bradycardia, unspecified: Secondary | ICD-10-CM | POA: Diagnosis not present

## 2021-06-19 DIAGNOSIS — F5221 Male erectile disorder: Secondary | ICD-10-CM | POA: Diagnosis not present

## 2021-06-19 DIAGNOSIS — I2510 Atherosclerotic heart disease of native coronary artery without angina pectoris: Secondary | ICD-10-CM | POA: Diagnosis not present

## 2021-06-19 NOTE — Progress Notes (Signed)
?  Subjective:  ?Patient ID: Jordan Resides., male    DOB: 1945-04-30,  MRN: [id number] ? ?Jordan [name]. presents to clinic today for at risk foot care with history of diabetic neuropathy and painful thick toenails that are difficult to trim. Pain interferes with ambulation. Aggravating factors include wearing enclosed shoe gear. Pain is relieved with periodic professional debridement. ? ?Patient states blood glucose was 99 mg/dl.  Last HgA1c was 5.6%. ? ?New problem(s): None.  ? ?PCP is Iona Beard, MD , and last visit was February 13, 2021. ? ?Allergies  ?Allergen Reactions  ? Codeine   ?  Rash itching  ? Statins Other (See Comments)  ?  Muscle aches ?Muscle aches  ? ? ?Review of Systems: Negative except as noted in the HPI. ? ?Objective: No changes noted in today's physical examination. ?Jordan [name]. is a pleasant 76 y.o. male in NAD. AAO X 3. ? ?Vascular Examination: ?CFT <3 seconds b/l LE. Palpable DP/PT pulses b/l LE. Digital hair absent b/l. Skin temperature gradient WNL b/l. No pain with calf compression b/l. No edema noted b/l. No cyanosis or clubbing noted b/l LE. ? ?Dermatological Examination: ?Pedal integument with normal turgor, texture and tone b/l LE. No open wounds b/l. No interdigital macerations b/l. Toenails 1-5 b/l elongated, thickened, discolored with subungual debris. +Tenderness with dorsal palpation of nailplates. Hyperkeratotic lesion(s) noted bilateral 2nd toes and submet head 1 b/l. ? ?Musculoskeletal Examination: ?Muscle strength 5/5 to all lower extremity muscle groups bilaterally. Hammertoe deformity noted 2-5 b/l. Utilizes cane for ambulation assistance. ? ?Neurological Examination: ?Protective sensation intact 5/5 intact bilaterally with 10g monofilament b/l. ? ?Assessment/Plan: ?1. Pain due to onychomycosis of toenails of both feet   ?2. Corns and callosities   ?3. Type II diabetes mellitus with neurological manifestations ([place])   ?  ?-Examined patient. ?-Patient to continue  soft, supportive shoe gear daily. ?-Toenails 1-5 b/l were debrided in length and girth with sterile nail nippers and dremel without iatrogenic bleeding.  ?-Corn(s) bilateral 2nd toes and submet head 1 b/l pared utilizing sterile scalpel blade without complication or incident. Total number debrided=4. ?-Patient/POA to call should there be question/concern in the interim.  ? ?Return in about 3 months (around 09/11/2021). ? ?Marzetta Board, DPM  ?

## 2021-06-21 ENCOUNTER — Ambulatory Visit: Payer: Medicare HMO | Admitting: Physician Assistant

## 2021-06-21 ENCOUNTER — Ambulatory Visit: Payer: Medicare HMO | Admitting: Urology

## 2021-06-21 VITALS — BP 139/74 | HR 58

## 2021-06-21 DIAGNOSIS — R300 Dysuria: Secondary | ICD-10-CM | POA: Diagnosis not present

## 2021-06-21 DIAGNOSIS — C61 Malignant neoplasm of prostate: Secondary | ICD-10-CM | POA: Diagnosis not present

## 2021-06-21 DIAGNOSIS — N410 Acute prostatitis: Secondary | ICD-10-CM | POA: Diagnosis not present

## 2021-06-21 LAB — BLADDER SCAN AMB NON-IMAGING: Scan Result: 13

## 2021-06-21 MED ORDER — DOXYCYCLINE HYCLATE 100 MG PO CAPS: 100.0000 mg | ORAL_CAPSULE | Freq: Two times a day (BID) | ORAL | 0 refills | Status: DC

## 2021-06-21 NOTE — Progress Notes (Signed)
? ?Assessment: ?1. Dysuria ?- Urinalysis, Routine w reflex microscopic ?- BLADDER SCAN AMB NON-IMAGING ? ?2. Acute prostatitis ? ?3. Malignant neoplasm of prostate ([place]) ?  ? ?Plan: ?Doxycycline prescribed for the next 3 weeks and findings and plan of care discussed at length with patient.  If he continues to have burning, will consider Pyridium.  He will keep his previously scheduled appointment for [facility] with Dr. Jeffie Pollock. ? ?Chief Complaint: ?No chief complaint on file. ? ? ?HPI: ?Jordan [name]. is a 76 y.o. male with h/o prostate CA and who presents for evaluation of sudden onset of urinary burning primarily at the end of his penis for the past 2 days.  He also has an increase in nocturia up to 5 times per night and usually goes once or twice.  No gross hematuria.  No fever, chills, nausea or vomiting.  He has been on Flomax for years and continues with [place] treatments for his history of prostate CA. ?PVR = 13 mL ?UA = clear today. ? ?4/13//23: Jordan [name] returns today in f/u for the history below.  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  He will continue the ADT until 11/23.  His PSA <0.1 and his T is 17.  He has hot flashes but no other complaints.  His weight is stable.   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  His IPSS is 7 with nocturia x 2.  He has some urgency.  His UA is clear.  ?  ?03/08/21: Jordan [name] returns today in f/u.  He got his last firmagon on 02/01/21 and is due today and will continue it through the end of the year.  His PSA is down further to 0.2.  He remains on tamsulosin and if voiding well but he has nocturia 2-3x.  His IPSS is 11.  He has had no further gross hematuria.  He had a CT hematuria study on 02/12/21 that showed some small stones but no other concerning findings.  He is for cystoscopy today.   He has no GI complaints but is on a stool softener.  He has no weight loss or bone pain.  He  has occasional hot flashes 2-3x daily.   ?  ?11/30/20: Jordan [name] returns today for the history noted below.  His PSA is 1.3.  He completed EXRT on 11/07/20.  He remains on firmagon and is due for an injection today.  He has worsening LUTS with an IPSS of 17.  He was started on tamsulosin by Dr. Tammi Klippel but he still has nocturia x 3 and urgency.  He has no GI complaints but is on metamucil and is using some miralax to help reduce the need to strain.  He has no bone pain or weight loss but has neuropathy and arthritis.  He has a few hot flashes.  ?  ?08/03/20: Jordan [name] returns today in f/u.  He is a month out from firmagon '240mg'$  and his PSA is down to 1.2 with a T of 22.  He is scheduled for a seed implant with [employer] on 09/08/20.  He is tolerating the injection well  with just mild hot flashes. He is voiding well with an IPSS of 2.  His UA is unremarkable.  ?  ?06/29/20:  Jordan [name] returns today to discuss LT-ADT as a component of treatment of his Gleason 8 high volume prostate cancer.   I  have reviewed the risks and side effects ADT in detail and will initiate firmagon today and continue that monthly.   ?  ?06/08/20: Jordan [name] returns today in f/u to discuss the results of his prostate biopsy and staging studies.  He was found to have a 35.12m prostate with 6/6 right cores positive for cancer.  5/6 were GG4 with up to 90% involvement and 1/6 was GG3.  He has high risk disease and has undergone staging with bone scan and CT with both being negative.  He has stage T2b N0 M0 cancer.  He has DM, CAD and HTN as well as morbid obesity but swims regularly and is compliant with his meds.  He should have a reasonable 5 year life expectancy.  ?  ?Hx: He  is a 76yo male who is sent by Dr. HBerdine Addisonfor an increase in his PSA from 1.7 on 02/02/18 to 3.58 on 02/15/20.  He is voiding well with nocturia 1-2x.  His IPSS is 1. He has a good stream but it sprays a bit.  He has ED but is no longer responding to sildenafil.  He has had no UTI's or  GU surgery.  His father had some prostate issues, possibly cancer, but he had other medical issues so he was not felt to be a biopsy candidate.  ?Portions of the above documentation were copied from a prior visit for review purposes only. ? ?Allergies: ?Allergies  ?Allergen Reactions  ? Codeine   ?  Rash itching  ? Statins Other (See Comments)  ?  Muscle aches ?Muscle aches  ? ? ?PMH: ?Past Medical History:  ?Diagnosis Date  ? Arthritis   ? all over oa  ? CAD (coronary artery disease)   ? Cancer ([facility]   ? Prostate  ? Colon polyps 01/23/2010  ? Descending  ? Diverticulosis   ? dm type 2   ? Eczema 09/05/2020  ? Gastric ulcer 2010  ? GI bleed 2010  ? Hyperlipidemia   ? Hypertension   ? Hypotension   ? transient  ? Neuropathy 09/05/2020  ? both hands and feet  ? OSA (obstructive sleep apnea)   ? Severe, on CPAP therapy  ? [place]   ? Wears glasses 09/05/2020  ? ? ?PSH: ?Past Surgical History:  ?Procedure Laterality Date  ? CARDIAC CATHETERIZATION  05/01/2007  ? Recommend rotational atherectomy followed by PTCA and probable stenting of LAD.  ? CARDIAC CATHETERIZATION  05/28/2007  ? Mid LAD hihgh grade 80-90% stenosis, stented with a 3x350mCypher stent implanted at 16atm, postdilated with a 3.25x2067muantum at 16atm, which revealed excellent wall appoistion - 5.32m78m CARDIOVASCULAR STRESS TEST  02/05/2011  ? Perfusion defect seen in inferior myocardial region consistent with diagphragmatic attenuation. Remaining myocardium demonstrates normal myocardial perfusion with no evidence of ischemia or infarct. ECG is positive for ischemia.  ? colonscopy  2012  ? CPAP/BIPAP SLEEP STUDY  07/01/2007  ? AHI-0.5/hr at 11cm water pressure, RDI-12.6/hr  ? CYSTOSCOPY  09/08/2020  ? Procedure: CYSTOSCOPY FLEXIBLE;  Surgeon: WrenIrine Seal;  [facility];;  NO SEEDS FOUND IN BLADDER  ? LOWER ARTERIAL DOPPLER  06/03/2007  ? No evidence of dissection, AV fistula, or active  pseudoaneurysm.  ? NASAL ENDOSCOPY  12/2008  ? RADIOACTIVE SEED IMPLANT N/A 09/08/2020  ? Procedure: RADIOACTIVE SEED IMPLANT/BRACHYTHERAPY IMPLANT;  Surgeon: WrenIrine Seal;  [facility];  Laterality: N/A;  51  SEEDS IMPLANTED  ? SLEEP STUDY  05/13/2007  ? AHI-11.52/hr, AHI REM-56.0/hr, RDI-11.7/hr, RDI REM-56.0/hr, avg oxygen sat-94%  ? SPACE OAR INSTILLATION N/A 09/08/2020  ? Procedure: SPACE OAR INSTILLATION;  Surgeon: Irine Seal, MD;  Location: [facility];  Service: Urology;  Laterality: N/A;  ? stent to heart x 1  2010  ? TRANSTHORACIC ECHOCARDIOGRAM  03/13/2010  ? EF >16%, normal diastolic function, mild MR, mild TR, and normal RVSP  ? ? ?SH: ?Social History  ? ?Tobacco Use  ? Smoking status: Former  ?  Packs/day: 0.50  ?  Years: 16.00  ?  Pack years: 8.00  ?  Types: Cigarettes  ?  Quit date: 01/28/1978  ?  Years since quitting: 43.4  ? Smokeless tobacco: Never  ?Vaping Use  ? Vaping Use: Never used  ?Substance Use Topics  ? Alcohol use: Not Currently  ? Drug use: No  ? ? ?ROS: ?See HPI ? ?PE: ?BP 139/74   Pulse (!) 58  ?GENERAL APPEARANCE:  Well appearing, well developed, well nourished, NAD ?HEENT:  Atraumatic, normocephalic ?NECK:  Supple. Trachea midline ?ABDOMEN:  Soft, non-tender, no masses ?GU: Prostate exam reveals significantly boggy, but non-tender gland  No masses appreciated ?EXTREMITIES:  Moves all extremities well, without clubbing, cyanosis, or edema ?NEUROLOGIC:  Alert and oriented x 3, normal gait, CN II-XII grossly intact ?MENTAL STATUS:  appropriate ?BACK:  Non-tender to palpation, No CVAT ?SKIN:  Warm, dry, and intact ? ? ?Results: ?Laboratory Data: ?Lab Results  ?Component Value Date  ? WBC 8.0 09/05/2020  ? HGB 14.5 09/05/2020  ? HCT 44.3 09/05/2020  ? MCV 91.3 09/05/2020  ? PLT 238 09/05/2020  ? ? ?Lab Results  ?Component Value Date  ? CREATININE 0.80 02/12/2021  ? ? ?No results found for: PSA ? ?Lab Results  ?Component Value  Date  ? TESTOSTERONE 17 (L) 06/01/2021  ? ? ?No results found for: HGBA1C ? ?Urinalysis ?   ?Component Value Date/Time  ? COLORURINE YELLOW 01/24/2009 0201  ? APPEARANCEUR Clear 06/07/2021 1621  ? LABSPEC 1.0

## 2021-06-21 NOTE — Progress Notes (Signed)
post void residual=13 

## 2021-06-22 LAB — URINALYSIS, ROUTINE W REFLEX MICROSCOPIC
Bilirubin, UA: NEGATIVE
Glucose, UA: NEGATIVE
Ketones, UA: NEGATIVE
Leukocytes,UA: NEGATIVE
Nitrite, UA: NEGATIVE
Protein,UA: NEGATIVE
RBC, UA: NEGATIVE
Specific Gravity, UA: >1.03 — ABNORMAL HIGH (ref 1.005–1.030)
Urobilinogen, Ur: 0.2 mg/dL (ref 0.2–1.0)
pH, UA: 5.5 (ref 5.0–7.5)

## 2021-06-29 ENCOUNTER — Telehealth: Payer: Self-pay

## 2021-06-29 NOTE — Telephone Encounter (Signed)
Patient states that he was still having burring when urinating but he is not getting up as much during the night to void.  He called his PCP and just wanted to inform the office here of his symptoms.  Advised patient that I would pass this information to [place] and to call our office if his symptoms worsened.  Patient voiced understanding.  ?

## 2021-07-10 ENCOUNTER — Ambulatory Visit (INDEPENDENT_AMBULATORY_CARE_PROVIDER_SITE_OTHER): Payer: Medicare HMO | Admitting: Physician Assistant

## 2021-07-10 DIAGNOSIS — C61 Malignant neoplasm of prostate: Secondary | ICD-10-CM

## 2021-07-10 MED ORDER — DEGARELIX ACETATE 80 MG ~~LOC~~ SOLR
80.0000 mg | Freq: Once | SUBCUTANEOUS | Status: AC
  Administered 2021-07-10: 80 mg via SUBCUTANEOUS

## 2021-07-10 MED ORDER — DOXYCYCLINE HYCLATE 100 MG PO CAPS: 100.0000 mg | ORAL_CAPSULE | Freq: Two times a day (BID) | ORAL | 0 refills | Status: DC

## 2021-07-10 NOTE — Progress Notes (Signed)
Firmagon Sub Q Injection  Due to Prostate Cancer patient is present today for a Firmagon Injection.   Medication: Firmagon (Degarelix)  Dose: 80mg Location: left upper abdomen Lot: [id number] Exp: 09/26/2023  Patient tolerated well, no complications were noted  Performed by: Arcangel Minion, CMA  Follow up: Follow up as scheduled.  

## 2021-08-09 ENCOUNTER — Ambulatory Visit (INDEPENDENT_AMBULATORY_CARE_PROVIDER_SITE_OTHER): Payer: Medicare HMO | Admitting: Physician Assistant

## 2021-08-09 DIAGNOSIS — C61 Malignant neoplasm of prostate: Secondary | ICD-10-CM

## 2021-08-09 MED ORDER — DEGARELIX ACETATE 80 MG ~~LOC~~ SOLR
80.0000 mg | Freq: Once | SUBCUTANEOUS | Status: AC
  Administered 2021-08-09: 80 mg via SUBCUTANEOUS

## 2021-08-09 NOTE — Progress Notes (Signed)
Firmagon Sub Q Injection  Due to Prostate Cancer patient is present today for a Firmagon Injection.   Medication: Mills Koller (Degarelix)  Dose: '80mg'$  Location: right upper abdomen Lot: [id number] Exp: 09/26/2023  Patient tolerated well, no complications were noted  Performed by: Levi Aland, CMA   Follow up: Follow up in 1 month

## 2021-09-07 ENCOUNTER — Ambulatory Visit (INDEPENDENT_AMBULATORY_CARE_PROVIDER_SITE_OTHER): Payer: Medicare HMO | Admitting: Physician Assistant

## 2021-09-07 DIAGNOSIS — C61 Malignant neoplasm of prostate: Secondary | ICD-10-CM | POA: Diagnosis not present

## 2021-09-07 DIAGNOSIS — M25562 Pain in left knee: Secondary | ICD-10-CM | POA: Insufficient documentation

## 2021-09-07 MED ORDER — DEGARELIX ACETATE 80 MG ~~LOC~~ SOLR
80.0000 mg | Freq: Once | SUBCUTANEOUS | Status: AC
  Administered 2021-09-07: 80 mg via SUBCUTANEOUS

## 2021-09-07 NOTE — Progress Notes (Signed)
Firmagon Sub Q Injection  Due to Prostate Cancer patient is present today for a Firmagon Injection.   Medication: Firmagon (Degarelix)  Dose: 80mg Location: left upper abdomen  Patient tolerated well, no complications were noted  Performed by: Antonino Nienhuis LPN  Follow up: 1 month NV  

## 2021-09-12 DIAGNOSIS — M170 Bilateral primary osteoarthritis of knee: Secondary | ICD-10-CM | POA: Diagnosis not present

## 2021-09-12 DIAGNOSIS — M25561 Pain in right knee: Secondary | ICD-10-CM | POA: Diagnosis not present

## 2021-09-12 DIAGNOSIS — M1712 Unilateral primary osteoarthritis, left knee: Secondary | ICD-10-CM | POA: Insufficient documentation

## 2021-09-12 DIAGNOSIS — M25562 Pain in left knee: Secondary | ICD-10-CM | POA: Diagnosis not present

## 2021-09-14 ENCOUNTER — Ambulatory Visit: Payer: Medicare HMO | Admitting: Podiatry

## 2021-09-14 ENCOUNTER — Encounter: Payer: Self-pay | Admitting: Podiatry

## 2021-09-14 DIAGNOSIS — E1149 Type 2 diabetes mellitus with other diabetic neurological complication: Secondary | ICD-10-CM

## 2021-09-14 DIAGNOSIS — B351 Tinea unguium: Secondary | ICD-10-CM | POA: Diagnosis not present

## 2021-09-14 DIAGNOSIS — M79674 Pain in right toe(s): Secondary | ICD-10-CM | POA: Diagnosis not present

## 2021-09-14 DIAGNOSIS — M79675 Pain in left toe(s): Secondary | ICD-10-CM

## 2021-09-14 DIAGNOSIS — L84 Corns and callosities: Secondary | ICD-10-CM

## 2021-09-22 NOTE — Progress Notes (Signed)
  Subjective:  Patient ID: Jordan Resides., male    DOB: Aug 05, 1945,  MRN: [id number]  Jordan Resides. presents to clinic today for at risk foot care with history of diabetic neuropathy and corn(s) b/l lower extremities, callus(es) b/l lower extremities and painful mycotic nails.  Pain interferes with ambulation. Aggravating factors include wearing enclosed shoe gear. Painful toenails interfere with ambulation. Aggravating factors include wearing enclosed shoe gear. Pain is relieved with periodic professional debridement. Painful corns and calluses are aggravated when weightbearing with and without shoegear. Pain is relieved with periodic professional debridement.  Last A1c was 6.5%.  Patient did not check blood glucose today.  New problem(s): None.   PCP is Iona Beard, MD , and last visit was  June 19, 2021  Allergies  Allergen Reactions   Codeine     Rash itching   Statins Other (See Comments)    Muscle aches Muscle aches    Review of Systems: Negative except as noted in the HPI.  Objective: No changes noted in today's physical examination. Jordan Leray Garverick. is a pleasant 76 y.o. male in NAD. AAO X 3.  Vascular Examination: CFT <3 seconds b/l LE. Palpable DP/PT pulses b/l LE. Digital hair absent b/l. Skin temperature gradient WNL b/l. No pain with calf compression b/l. No edema noted b/l. No cyanosis or clubbing noted b/l LE.  Dermatological Examination: Pedal integument with normal turgor, texture and tone b/l LE. No open wounds b/l. No interdigital macerations b/l. Toenails 1-5 b/l elongated, thickened, discolored with subungual debris. +Tenderness with dorsal palpation of nailplates. Hyperkeratotic lesion(s) noted distally bilateral 2nd toes.  Musculoskeletal Examination: Muscle strength 5/5 to all lower extremity muscle groups bilaterally. Hammertoe deformity noted 2-5 b/l. Utilizes cane for ambulation assistance.  Neurological Examination: Protective sensation intact 5/5  intact bilaterally with 10g monofilament b/l.  Assessment/Plan: 1. Pain due to onychomycosis of toenails of both feet   2. Corns   3. Type II diabetes mellitus with neurological manifestations [facility])   -Patient was evaluated and treated. All patient's and/or POA's questions/concerns answered on today's visit. -Mycotic toenails 1-5 bilaterally were debrided in length and girth with sterile nail nippers and dremel without incident. -Corn(s) distal tip of left 2nd toe and distal tip of right 2nd toe pared utilizing sterile scalpel blade without complication or incident. Total number debrided=2. -Patient/POA to call should there be question/concern in the interim.   Return in about 3 months (around 12/15/2021).  Jordan [name], DPM

## 2021-10-08 ENCOUNTER — Ambulatory Visit (INDEPENDENT_AMBULATORY_CARE_PROVIDER_SITE_OTHER): Payer: Medicare HMO | Admitting: Physician Assistant

## 2021-10-08 DIAGNOSIS — C61 Malignant neoplasm of prostate: Secondary | ICD-10-CM

## 2021-10-08 MED ORDER — DEGARELIX ACETATE 80 MG ~~LOC~~ SOLR
80.0000 mg | Freq: Once | SUBCUTANEOUS | Status: AC
  Administered 2021-10-08: 80 mg via SUBCUTANEOUS

## 2021-10-08 NOTE — Progress Notes (Signed)
Firmagon Sub Q Injection  Due to Prostate Cancer patient is present today for a Firmagon Injection.   Medication: Mills Koller (Degarelix)  Dose: '80mg'$  Location: left upper abdomen Lot: [id number]  Exp: 11/2023  Patient tolerated well, no complications were noted  Performed by: Estill Bamberg RN  Follow up: 1 month NV      Reviewed and agree

## 2021-10-12 ENCOUNTER — Ambulatory Visit: Payer: Medicare HMO | Admitting: Podiatry

## 2021-10-12 DIAGNOSIS — L600 Ingrowing nail: Secondary | ICD-10-CM | POA: Diagnosis not present

## 2021-10-12 DIAGNOSIS — R52 Pain, unspecified: Secondary | ICD-10-CM

## 2021-10-12 MED ORDER — MUPIROCIN 2 % EX OINT: 1.0000 | TOPICAL_OINTMENT | Freq: Two times a day (BID) | CUTANEOUS | 2 refills | Status: DC

## 2021-10-12 NOTE — Patient Instructions (Signed)

## 2021-10-12 NOTE — Progress Notes (Unsigned)
Subjective:   Patient ID: Jordan Resides., male   DOB: 76 y.o.   MRN: [id number]   HPI Chief Complaint  Patient presents with   Ingrown Toenail    Left foot hallux, ingrown toenail, which started 3 weeks ago, patient states that the toe is sore and had some redness, no drainage, A1c- 6.4 BG- 26    76 year old male presents with above complaints.  He states been using antibiotic ointment which has helped some.  Currently denies any drainage or pus.  Area is tender with pressure.  No other concerns.   Review of Systems  All other systems reviewed and are negative.  Past Medical History:  Diagnosis Date   Arthritis    all over oa   CAD (coronary artery disease)    Cancer ([place])    Prostate   Colon polyps 01/23/2010   Descending   Diverticulosis    dm type 2    Eczema 09/05/2020   Gastric ulcer 2010   GI bleed 2010   Hyperlipidemia    Hypertension    Hypotension    transient   Neuropathy 09/05/2020   both hands and feet   OSA (obstructive sleep apnea)    Severe, on CPAP therapy   Prostate cancer ([place])    Wears glasses 09/05/2020    Past Surgical History:  Procedure Laterality Date   CARDIAC CATHETERIZATION  05/01/2007   Recommend rotational atherectomy followed by PTCA and probable stenting of LAD.   CARDIAC CATHETERIZATION  05/28/2007   Mid LAD hihgh grade 80-90% stenosis, stented with a 3x1m Cypher stent implanted at 16atm, postdilated with a 3.25x280mQuantum at 16atm, which revealed excellent wall appoistion - 5.7912m CARDIOVASCULAR STRESS TEST  02/05/2011   Perfusion defect seen in inferior myocardial region consistent with diagphragmatic attenuation. Remaining myocardium demonstrates normal myocardial perfusion with no evidence of ischemia or infarct. ECG is positive for ischemia.   colonscopy  2012   CPAP/BIPAP SLEEP STUDY  07/01/2007   AHI-0.5/hr at 11cm water pressure, RDI-12.6/hr   CYSTOSCOPY  09/08/2020   Procedure: CYSTOSCOPY FLEXIBLE;  Surgeon: WreIrine SealD;  [facility];;  NO SEEDS FOUND IN BLADDER   LOWER ARTERIAL DOPPLER  06/03/2007   No evidence of dissection, AV fistula, or active pseudoaneurysm.   NASAL ENDOSCOPY  12/2008   RADIOACTIVE SEED IMPLANT N/A 09/08/2020   Procedure: RADIOACTIVE SEED IMPLANT/BRACHYTHERAPY IMPLANT;  Surgeon: WreIrine SealD;  [facility];  Laterality: N/A;  51 SEEDS IMPLANTED   SLEEP STUDY  05/13/2007   AHI-11.52/hr, AHI REM-56.0/hr, RDI-11.7/hr, RDI REM-56.0/hr, avg oxygen sat-94%   SPACE OAR INSTILLATION N/A 09/08/2020   Procedure: SPACE OAR INSTILLATION;  Surgeon: WreIrine SealD;  [facility];  Laterality: N/A;   stent to heart x 1  2010   TRANSTHORACIC ECHOCARDIOGRAM  03/13/2010   EF >55>40%ormal diastolic function, mild MR, mild TR, and normal RVSP     Current Outpatient Medications:    mupirocin ointment (BACTROBAN) 2 %, Apply 1 Application topically 2 (two) times daily., Disp: 30 g, Rfl: 2   ACCU-CHEK GUIDE test strip, TEST AS DIRECTED ONCEODAILY.M, Disp: , Rfl:    acetaminophen (TYLENOL) 650 MG CR tablet, Take 650 mg by mouth every 8 (eight) hours. Pt takes 2 tablets in the morning and 2 tablets in the evening, Disp: , Rfl:    Apoaequorin (PREVAGEN PO), Take 20 mg by mouth  daily., Disp: , Rfl:    aspirin 81 MG tablet, Take 81 mg by mouth daily., Disp: , Rfl:    calcium carbonate (OSCAL) 1500 (600 Ca) MG TABS tablet, Take 1,500 mg by mouth 2 (two) times daily with a meal., Disp: , Rfl:    carvedilol (COREG) 3.125 MG tablet, TAKE 1 TABLET EVERY DAY (NEED MD APPOINTMENT FOR REFILLS) (Patient taking differently: daily.), Disp: 90 tablet, Rfl: 3   Cholecalciferol (VITAMIN D3) 25 MCG (1000 UT) CAPS, Take 1 capsule by mouth 2 (two) times daily., Disp: , Rfl:    CINNAMON PO, Take 2 tablets by mouth 2 (two) times daily. 1000 mg tablet, Disp: , Rfl:    ezetimibe (ZETIA) 10 MG  tablet, TAKE ONE TABLET BY MOUTH ONCE DAILY., Disp: 90 tablet, Rfl: 3   FLUZONE HIGH-DOSE QUADRIVALENT 0.7 ML SUSY, , Disp: , Rfl:    ketoconazole (NIZORAL) 2 % cream, Apply 1 application topically daily., Disp: , Rfl:    lisinopril (ZESTRIL) 5 MG tablet, TAKE 1/2 TABLET EVERY DAY (NEED MD APPOINTMENT FOR REFILLS) (Patient taking differently: daily.), Disp: 45 tablet, Rfl: 3   metFORMIN (GLUCOPHAGE) 500 MG tablet, 500 mg daily with breakfast., Disp: , Rfl:    MODERNA COVID-19 BIVAL BOOSTER 50 MCG/0.5ML injection, , Disp: , Rfl:    MODERNA COVID-19 VACCINE 100 MCG/0.5ML injection, , Disp: , Rfl:    pantoprazole (PROTONIX) 40 MG tablet, TAKE 1 TABLET EVERY DAY (NEED MD APPOINTMENT FOR REFILLS), Disp: 45 tablet, Rfl: 0   psyllium (METAMUCIL) 58.6 % packet, Take by mouth daily. 1 Tablespoon daily, Disp: , Rfl:    SHINGRIX injection, , Disp: , Rfl:    simvastatin (ZOCOR) 40 MG tablet, TAKE (1) TABLET BY MOUTH AT BEDTIME., Disp: 90 tablet, Rfl: 1   tamsulosin (FLOMAX) 0.4 MG CAPS capsule, Take 1 capsule (0.4 mg total) by mouth daily after supper., Disp: 90 capsule, Rfl: 3  Allergies  Allergen Reactions   Codeine     Rash itching   Statins Other (See Comments)    Muscle aches Muscle aches          Objective:  Physical Exam  General: AAO x3, NAD  Dermatological: Ingrown toenail present left medial hallux nail border with localized edema but there is no cellulitis noted.  There is no drainage approximately signs of infection.  Vascular: Dorsalis Pedis artery and Posterior Tibial artery pedal pulses are palpable bilateral with immedate capillary fill time. There is no pain with calf compression, swelling, warmth, erythema.   Neruologic: Grossly intact via light touch bilateral.   Musculoskeletal: Tenderness of ingrown toenail.  No other areas of discomfort.  Muscular strength 5/5 in all groups tested bilateral.  Gait: Unassisted, Nonantalgic.       Assessment:   Left medial hallux  ingrown toenail     Plan:  -Treatment options discussed including all alternatives, risks, and complications -Etiology of symptoms were discussed -At this time, the patient is requesting partial nail removal with chemical matricectomy to the symptomatic portion of the nail. Risks and complications were discussed with the patient for which they understand and written consent was obtained. Under sterile conditions a total of 3 mL of a mixture of 2% lidocaine plain and 0.5% Marcaine plain was infiltrated in a hallux block fashion. Once anesthetized, the skin was prepped in sterile fashion. A tourniquet was then applied. Next the medial aspect of hallux nail border was then sharply excised making sure to remove the entire offending nail border. Once the nails  were ensured to be removed area was debrided and the underlying skin was intact. There is no purulence identified in the procedure. Next phenol was then applied under standard conditions and copiously irrigated. Silvadene was applied. A dry sterile dressing was applied. After application of the dressing the tourniquet was removed and there is found to be an immediate capillary refill time to the digit. The patient tolerated the procedure well any complications. Post procedure instructions were discussed the patient for which he verbally understood. Follow-up in one week for nail check or sooner if any problems are to arise. Discussed signs/symptoms of infection and directed to call the office immediately should any occur or go directly to the emergency room. In the meantime, encouraged to call the office with any questions, concerns, changes symptoms.   Trula Slade DPM

## 2021-10-15 ENCOUNTER — Other Ambulatory Visit: Payer: Self-pay | Admitting: Cardiovascular Disease

## 2021-10-15 ENCOUNTER — Other Ambulatory Visit: Payer: Self-pay | Admitting: Gastroenterology

## 2021-10-22 DIAGNOSIS — E1169 Type 2 diabetes mellitus with other specified complication: Secondary | ICD-10-CM | POA: Diagnosis not present

## 2021-10-22 DIAGNOSIS — Z6841 Body Mass Index (BMI) 40.0 and over, adult: Secondary | ICD-10-CM | POA: Diagnosis not present

## 2021-10-22 DIAGNOSIS — I2510 Atherosclerotic heart disease of native coronary artery without angina pectoris: Secondary | ICD-10-CM | POA: Diagnosis not present

## 2021-10-22 DIAGNOSIS — G4733 Obstructive sleep apnea (adult) (pediatric): Secondary | ICD-10-CM | POA: Diagnosis not present

## 2021-10-22 DIAGNOSIS — M150 Primary generalized (osteo)arthritis: Secondary | ICD-10-CM | POA: Diagnosis not present

## 2021-10-22 DIAGNOSIS — C61 Malignant neoplasm of prostate: Secondary | ICD-10-CM | POA: Diagnosis not present

## 2021-10-22 DIAGNOSIS — R001 Bradycardia, unspecified: Secondary | ICD-10-CM | POA: Diagnosis not present

## 2021-10-22 DIAGNOSIS — F5221 Male erectile disorder: Secondary | ICD-10-CM | POA: Diagnosis not present

## 2021-10-23 ENCOUNTER — Telehealth: Payer: Self-pay | Admitting: Gastroenterology

## 2021-10-23 MED ORDER — PANTOPRAZOLE SODIUM 40 MG PO TBEC: 40.0000 mg | DELAYED_RELEASE_TABLET | Freq: Every day | ORAL | 0 refills | Status: DC

## 2021-10-23 NOTE — Telephone Encounter (Signed)
Patient needs a refill for his pantoprazole.  He has an appointment scheduled with [facility] 9/6 for a med check.  He states he is having no issues, just needs his prescription refilled.  He would like his medication sent to [facility] in [place], [place].  Their phone number is [phone].  If there is any issue, please call patient and advise. Thank you.

## 2021-10-23 NOTE — Telephone Encounter (Signed)
Pantoprazole sent to [facility] as requested.

## 2021-10-31 ENCOUNTER — Ambulatory Visit: Payer: Medicare HMO | Admitting: Physician Assistant

## 2021-10-31 ENCOUNTER — Encounter: Payer: Self-pay | Admitting: Physician Assistant

## 2021-10-31 VITALS — BP 104/60 | HR 68 | Ht 64.0 in | Wt 274.5 lb

## 2021-10-31 DIAGNOSIS — K2100 Gastro-esophageal reflux disease with esophagitis, without bleeding: Secondary | ICD-10-CM | POA: Diagnosis not present

## 2021-10-31 DIAGNOSIS — Z8711 Personal history of peptic ulcer disease: Secondary | ICD-10-CM | POA: Diagnosis not present

## 2021-10-31 MED ORDER — PANTOPRAZOLE SODIUM 40 MG PO TBEC: 40.0000 mg | DELAYED_RELEASE_TABLET | Freq: Every day | ORAL | 3 refills | Status: DC

## 2021-10-31 NOTE — Progress Notes (Signed)
Chief Complaint: Med Refill  Review of gastrointestinal problems:  1. Gastric ulcer, November 2010. EGD. Presented with an overt melenic GI bleed. Likely NSAID related. H. pylori biopsies negative. Repeat EGD 3 months later was normal.  I recommended he stay on proton pump inhibitor once daily given its history gastric ulcer, permanent need for aspirin and Plavix. 2. routine risk for colon cancer: Colonoscopy by Dr. Ardis Hughs November 2011 small hyper plastic polyp. Internal hemorrhoids, minor diverticulosis. He was recommended to have repeat examination at 10 year interval for screening  HPI:    Jordan [name] is a 76 year old African-American male with a past medical history of CAD, colon polyps, diverticulosis, gastric ulcer, OSA and prostate cancer, known to Dr. Ardis Hughs, who presents to clinic today for medication refill.    01/19/2019 office visit with Dr. Ardis Hughs on at that time discussed the need to stay on Pantoprazole 40 mg once daily due to his history of gastric ulcer and need for Aspirin and Plavix.  At that visit recommended that he follow-up in 2 years for further refills.  Discussed repeat colonoscopy but he preferred not to have another screening.    10/23/2021 patient called stating that he needed a refill of his Pantoprazole.  This was provided until his appointment today.    Today, the patient tells me the story of when he was started on his Pantoprazole and he has continued to use it daily since then.  He has no breakthrough symptoms.  His blood thinner has been stopped but he remains on Aspirin.  No GI complaints or concerns.    Denies fever, chills, change in bowel habits, weight loss or blood in his stool.  Past Medical History:  Diagnosis Date   Arthritis    all over oa   CAD (coronary artery disease)    Cancer ([place])    Prostate   Colon polyps 01/23/2010   Descending   Diverticulosis    dm type 2    Eczema 09/05/2020   Gastric ulcer 2010   GI bleed 2010    Hyperlipidemia    Hypertension    Hypotension    transient   Neuropathy 09/05/2020   both hands and feet   OSA (obstructive sleep apnea)    Severe, on CPAP therapy   Prostate cancer ([place])    Wears glasses 09/05/2020    Past Surgical History:  Procedure Laterality Date   CARDIAC CATHETERIZATION  05/01/2007   Recommend rotational atherectomy followed by PTCA and probable stenting of LAD.   CARDIAC CATHETERIZATION  05/28/2007   Mid LAD hihgh grade 80-90% stenosis, stented with a 3x7m Cypher stent implanted at 16atm, postdilated with a 3.25x258mQuantum at 16atm, which revealed excellent wall appoistion - 5.795m CARDIOVASCULAR STRESS TEST  02/05/2011   Perfusion defect seen in inferior myocardial region consistent with diagphragmatic attenuation. Remaining myocardium demonstrates normal myocardial perfusion with no evidence of ischemia or infarct. ECG is positive for ischemia.   colonscopy  2012   CPAP/BIPAP SLEEP STUDY  07/01/2007   AHI-0.5/hr at 11cm water pressure, RDI-12.6/hr   CYSTOSCOPY  09/08/2020   Procedure: CYSTOSCOPY FLEXIBLE;  Surgeon: WreIrine SealD;  [facility];;  NO SEEDS FOUND IN BLADDER   LOWER ARTERIAL DOPPLER  06/03/2007   No evidence of dissection, AV fistula, or active pseudoaneurysm.   NASAL ENDOSCOPY  12/2008   RADIOACTIVE SEED IMPLANT N/A 09/08/2020   Procedure: RADIOACTIVE SEED IMPLANT/BRACHYTHERAPY IMPLANT;  Surgeon: WreIrine SealD;  Location: [facility];  Service: Urology;  Laterality: N/A;  51 SEEDS IMPLANTED   SLEEP STUDY  05/13/2007   AHI-11.52/hr, AHI REM-56.0/hr, RDI-11.7/hr, RDI REM-56.0/hr, avg oxygen sat-94%   SPACE OAR INSTILLATION N/A 09/08/2020   Procedure: SPACE OAR INSTILLATION;  Surgeon: Wrenn, John, MD;  Location: [facility];  Service: Urology;  Laterality: N/A;   stent to heart x 1  2010   TRANSTHORACIC ECHOCARDIOGRAM  03/13/2010   EF >55%, normal diastolic  function, mild MR, mild TR, and normal RVSP    Current Outpatient Medications  Medication Sig Dispense Refill   ACCU-CHEK GUIDE test strip TEST AS DIRECTED ONCEODAILY.M     acetaminophen (TYLENOL) 650 MG CR tablet Take 650 mg by mouth every 8 (eight) hours. Pt takes 2 tablets in the morning and 2 tablets in the evening     Apoaequorin (PREVAGEN PO) Take 20 mg by mouth daily.     aspirin 81 MG tablet Take 81 mg by mouth daily.     calcium carbonate (OSCAL) 1500 (600 Ca) MG TABS tablet Take 1,500 mg by mouth 2 (two) times daily with a meal.     carvedilol (COREG) 3.125 MG tablet Take 1 tablet (3.125 mg total) by mouth 2 (two) times daily with a meal. 90 tablet 3   Cholecalciferol (VITAMIN D3) 25 MCG (1000 UT) CAPS Take 1 capsule by mouth 2 (two) times daily.     CINNAMON PO Take 2 tablets by mouth 2 (two) times daily. 1000 mg tablet     ezetimibe (ZETIA) 10 MG tablet TAKE ONE TABLET BY MOUTH ONCE DAILY. 90 tablet 3   FLUZONE HIGH-DOSE QUADRIVALENT 0.7 ML SUSY      ketoconazole (NIZORAL) 2 % cream Apply 1 application topically daily.     lisinopril (ZESTRIL) 5 MG tablet Take 1 tablet (5 mg total) by mouth daily. 45 tablet 3   metFORMIN (GLUCOPHAGE) 500 MG tablet 500 mg daily with breakfast.     MODERNA COVID-19 BIVAL BOOSTER 50 MCG/0.5ML injection      MODERNA COVID-19 VACCINE 100 MCG/0.5ML injection      mupirocin ointment (BACTROBAN) 2 % Apply 1 Application topically 2 (two) times daily. 30 g 2   pantoprazole (PROTONIX) 40 MG tablet Take 1 tablet (40 mg total) by mouth daily. 30 tablet 0   psyllium (METAMUCIL) 58.6 % packet Take by mouth daily. 1 Tablespoon daily     SHINGRIX injection      simvastatin (ZOCOR) 40 MG tablet TAKE (1) TABLET BY MOUTH AT BEDTIME. 90 tablet 1   tamsulosin (FLOMAX) 0.4 MG CAPS capsule Take 1 capsule (0.4 mg total) by mouth daily after supper. 90 capsule 3   No current facility-administered medications for this visit.    Allergies as of 10/31/2021 - Review  Complete 10/12/2021  Allergen Reaction Noted   Codeine     Statins Other (See Comments) 07/02/2016    Family History  Problem Relation Age of Onset   Stroke Brother    Hypertension Brother    Alzheimer's disease Maternal Grandmother    Stroke Maternal Grandfather    Heart disease Maternal Grandfather    Stroke Paternal Grandmother    Heart attack Paternal Grandfather    Hypertension Father    Alzheimer's disease Father    Parkinson's disease Father    Alzheimer's disease Mother    Pancreatic cancer Maternal Uncle    Breast cancer Neg Hx    Prostate cancer Neg Hx    Colon cancer Neg   Hx     Social History   Socioeconomic History   Marital status: Married    Spouse name: Not on file   Number of children: 2   Years of education: Not on file   Highest education level: Not on file  Occupational History   Occupation: retired  Tobacco Use   Smoking status: Former    Packs/day: 0.50    Years: 16.00    Total pack years: 8.00    Types: Cigarettes    Quit date: 01/28/1978    Years since quitting: 43.7   Smokeless tobacco: Never  Vaping Use   Vaping Use: Never used  Substance and Sexual Activity   Alcohol use: Not Currently   Drug use: No   Sexual activity: Yes  Other Topics Concern   Not on file  Social History Narrative   Not on file   Social Determinants of Health   Financial Resource Strain: Low Risk  (02/12/2021)   Overall Financial Resource Strain (CARDIA)    Difficulty of Paying Living Expenses: Not hard at all  Food Insecurity: No Food Insecurity (02/12/2021)   Hunger Vital Sign    Worried About Running Out of Food in the Last Year: Never true    [place] in the Last Year: Never true  Transportation Needs: No Transportation Needs (02/12/2021)   PRAPARE - Hydrologist (Medical): No    Lack of Transportation (Non-Medical): No  Physical Activity: Inactive (02/12/2021)   Exercise Vital Sign    Days of Exercise per Week: 0  days    Minutes of Exercise per Session: 0 min  Stress: No Stress Concern Present (02/12/2021)   Time    Feeling of Stress : Not at all  Social Connections: Moderately Integrated (02/12/2021)   Social Connection and Isolation Panel [NHANES]    Frequency of Communication with Friends and Family: More than three times a week    Frequency of Social Gatherings with Friends and Family: Three times a week    Attends Religious Services: 1 to 4 times per year    Active Member of Clubs or Organizations: No    Attends Archivist Meetings: Never    Marital Status: Married  Human resources officer Violence: Not At Risk (02/12/2021)   Humiliation, Afraid, Rape, and Kick questionnaire    Fear of Current or Ex-Partner: No    Emotionally Abused: No    Physically Abused: No    Sexually Abused: No    Review of Systems:    Constitutional: No weight loss, fever or chills Cardiovascular: No chest pain  Respiratory: No SOB Gastrointestinal: See HPI and otherwise negative   Physical Exam:  Vital signs: BP 104/60 (BP Location: Left Arm, Patient Position: Sitting, Cuff Size: Normal)   Pulse 68   Ht 5' 4" (1.626 m) Comment: height measured without shoes  Wt 274 lb 8 oz (124.5 kg)   BMI 47.12 kg/m    Constitutional:   Pleasant morbidly obese AA male appears to be in NAD, Well developed, Well nourished, alert and cooperative Respiratory: Respirations even and unlabored. Lungs clear to auscultation bilaterally.   No wheezes, crackles, or rhonchi.  Cardiovascular: Normal S1, S2. No MRG. Regular rate and rhythm. No peripheral edema, cyanosis or pallor.  Gastrointestinal:  Soft, nondistended, nontender. No rebound or guarding. Normal bowel sounds. No appreciable masses or hepatomegaly. Rectal:  Not performed.  Psychiatric: Oriented to person, place and time.  Demonstrates good judgement and reason without abnormal affect or  behaviors.  RELEVANT LABS AND IMAGING: CBC    Component Value Date/Time   WBC 8.0 09/05/2020 0922   RBC 4.85 09/05/2020 0922   HGB 14.5 09/05/2020 0922   HCT 44.3 09/05/2020 0922   PLT 238 09/05/2020 0922   MCV 91.3 09/05/2020 0922   MCH 29.9 09/05/2020 0922   MCHC 32.7 09/05/2020 0922   RDW 13.7 09/05/2020 0922   LYMPHSABS 2.2 03/28/2009 1519   MONOABS 1.1 (H) 03/28/2009 1519   EOSABS 0.4 03/28/2009 1519   BASOSABS 0.1 03/28/2009 1519    CMP     Component Value Date/Time   NA 141 09/05/2020 0922   K 4.1 09/05/2020 0922   CL 105 09/05/2020 0922   CO2 29 09/05/2020 0922   GLUCOSE 115 (H) 09/05/2020 0922   BUN 12 09/05/2020 0922   CREATININE 0.80 02/12/2021 0946   CREATININE 0.84 10/03/2016 0740   CALCIUM 9.5 09/05/2020 0922   PROT 7.9 09/05/2020 0922   ALBUMIN 4.1 09/05/2020 0922   AST 19 09/05/2020 0922   ALT 19 09/05/2020 0922   ALKPHOS 58 09/05/2020 0922   BILITOT 0.6 09/05/2020 0922   GFRNONAA >60 09/05/2020 0922   GFRAA  01/25/2009 0220    >60        The eGFR has been calculated using the MDRD equation. This calculation has not been validated in all clinical situations. eGFR's persistently <60 mL/min signify possible Chronic Kidney Disease.    Assessment: 1.  History of gastric ulcer: With need to stay on Aspirin given cardiac history, continued on daily PPI 2.  GERD: Controlled on Pantoprazole 40 mg daily  Plan: 1.  Continue Pantoprazole 40 mg every morning, 30 to 60 minutes before breakfast.  Refills provided #90 with 3 refills.  Patient can have refill for another year when this runs out.  He will then need to be seen for follow-up in 2025. 2.  Patient to follow-up as needed before above.  Jennifer Lemmon, PA-C [facility] 10/31/2021, 2:37 PM  Cc: Hill, Gerald, MD  

## 2021-10-31 NOTE — Patient Instructions (Signed)
We have sent the following medications to your pharmacy for you to pick up at your convenience: pantoprazole.  _______________________________________________________  If you are age 78 or older, your body mass index should be between 23-30. Your Body mass index is 47.12 kg/m. If this is out of the aforementioned range listed, please consider follow up with your Primary Care Provider.  If you are age 84 or younger, your body mass index should be between 19-25. Your Body mass index is 47.12 kg/m. If this is out of the aformentioned range listed, please consider follow up with your Primary Care Provider.   ________________________________________________________  The [facility] providers would like to encourage you to use [facility] to communicate with providers for non-urgent requests or questions.  Due to long hold times on the telephone, sending your provider a message by [facility] may be a faster and more efficient way to get a response.  Please allow 48 business hours for a response.  Please remember that this is for non-urgent requests.  _______________________________________________________

## 2021-10-31 NOTE — Progress Notes (Signed)
____________________________________________________________  Attending physician addendum:  Thank you for sending this case to me. I have reviewed the entire note and agree with the plan.   Tiffiny Worthy Danis, MD  ____________________________________________________________  

## 2021-11-05 ENCOUNTER — Ambulatory Visit (INDEPENDENT_AMBULATORY_CARE_PROVIDER_SITE_OTHER): Payer: Medicare HMO | Admitting: Physician Assistant

## 2021-11-05 DIAGNOSIS — C61 Malignant neoplasm of prostate: Secondary | ICD-10-CM

## 2021-11-05 MED ORDER — DEGARELIX ACETATE 80 MG ~~LOC~~ SOLR
80.0000 mg | Freq: Once | SUBCUTANEOUS | Status: AC
  Administered 2021-11-05: 80 mg via SUBCUTANEOUS

## 2021-11-05 NOTE — Progress Notes (Signed)
Firmagon Sub Q Injection  Due to Prostate Cancer patient is present today for a Firmagon Injection.   Medication: Mills Koller (Degarelix)  Dose: '80mg'$  Location: right upper abdomen Lot: [id number] Exp: 12/2023  Patient tolerated well, no complications were noted  Performed by: Levi Aland, CMA  Follow up: Follow up in 1 month

## 2021-11-06 ENCOUNTER — Ambulatory Visit: Payer: Medicare HMO

## 2021-11-13 DIAGNOSIS — Z6841 Body Mass Index (BMI) 40.0 and over, adult: Secondary | ICD-10-CM | POA: Diagnosis not present

## 2021-11-13 DIAGNOSIS — M170 Bilateral primary osteoarthritis of knee: Secondary | ICD-10-CM | POA: Diagnosis not present

## 2021-11-22 ENCOUNTER — Ambulatory Visit: Payer: Medicare HMO | Admitting: Urology

## 2021-12-03 ENCOUNTER — Other Ambulatory Visit: Payer: Medicare HMO

## 2021-12-05 ENCOUNTER — Ambulatory Visit (INDEPENDENT_AMBULATORY_CARE_PROVIDER_SITE_OTHER): Payer: Medicare HMO | Admitting: Physician Assistant

## 2021-12-05 DIAGNOSIS — C61 Malignant neoplasm of prostate: Secondary | ICD-10-CM

## 2021-12-05 MED ORDER — DEGARELIX ACETATE 80 MG ~~LOC~~ SOLR
80.0000 mg | Freq: Once | SUBCUTANEOUS | Status: AC
  Administered 2021-12-05: 80 mg via SUBCUTANEOUS

## 2021-12-05 NOTE — Progress Notes (Signed)
Firmagon Sub Q Injection  Due to Prostate Cancer patient is present today for a Firmagon Injection.   Medication: Mills Koller (Degarelix)  Dose: '80mg'$  Location: left upper abdomen Lot: [id number]  Exp: [id number]  Patient tolerated well, no complications were noted  Performed by: Estill Bamberg RN  Follow up: monthly injections   Procedure reviewed. Agree with clinical documentation.  Julienne A Summerlin PA-C

## 2021-12-13 ENCOUNTER — Ambulatory Visit: Payer: Medicare HMO | Admitting: Urology

## 2021-12-13 ENCOUNTER — Other Ambulatory Visit: Payer: Medicare HMO

## 2021-12-13 DIAGNOSIS — C61 Malignant neoplasm of prostate: Secondary | ICD-10-CM

## 2021-12-14 LAB — PSA: Prostate Specific Ag, Serum: <0.1 ng/mL (ref 0.0–4.0)

## 2021-12-14 LAB — TESTOSTERONE: Testosterone: 23 ng/dL — ABNORMAL LOW (ref 264–916)

## 2021-12-20 ENCOUNTER — Ambulatory Visit: Payer: Medicare HMO | Admitting: Urology

## 2021-12-20 ENCOUNTER — Encounter: Payer: Self-pay | Admitting: Urology

## 2021-12-20 VITALS — BP 129/82 | HR 65

## 2021-12-20 DIAGNOSIS — N403 Nodular prostate with lower urinary tract symptoms: Secondary | ICD-10-CM | POA: Diagnosis not present

## 2021-12-20 DIAGNOSIS — R310 Gross hematuria: Secondary | ICD-10-CM

## 2021-12-20 DIAGNOSIS — R3915 Urgency of urination: Secondary | ICD-10-CM | POA: Diagnosis not present

## 2021-12-20 DIAGNOSIS — R351 Nocturia: Secondary | ICD-10-CM

## 2021-12-20 DIAGNOSIS — C61 Malignant neoplasm of prostate: Secondary | ICD-10-CM

## 2021-12-20 DIAGNOSIS — R350 Frequency of micturition: Secondary | ICD-10-CM

## 2021-12-20 MED ORDER — TAMSULOSIN HCL 0.4 MG PO CAPS: 0.4000 mg | ORAL_CAPSULE | Freq: Every day | ORAL | 3 refills | Status: DC

## 2021-12-20 NOTE — Progress Notes (Signed)
Subjective: 1. Malignant neoplasm of prostate ([place])   2. 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 Urgency of urination   4. Nocturia   5. Urinary frequency   6. Gross hematuria    12/20/21: Jordan [name] returns in f/u for history below.  Jordan [name] remains on ADT with firmagon and will get his last shot in November. His PSA remains undetectible and the testosterone is 23.  Jordan [name] has frequency q1.5-2hrs and nocturia x 3 but Jordan [name] is otherwise voiding well on tamsulosin with an IPSS of 14. Jordan [name] had a very small amount of blood in the urine about a month ago but none since. Jordan [name] had a hematuria w/u at the first of the year.  His weight is stable.  Jordan [name] has chronic arthralgias.  Jordan [name] has no GI complaints.  Jordan [name] continues to have hot flashes and altered taste along with reduced energy with the ADT.   4/13//23: Jordan [name] returns [name] in f/u for the history below.  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  Jordan [name] will continue the ADT until 11/23.  His PSA <0.1 and his T is 17.  Jordan [name] has hot flashes but no other complaints.  His weight is stable.   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  His IPSS is 7 with nocturia x 2.  Jordan [name] has some urgency.  His UA is clear.   03/08/21: Jordan [name] in f/u.  Jordan [name] got his last firmagon on 02/01/21 and is due [name] and will continue it through the end of the year.  His PSA is down further to 0.2.  Jordan [name] remains on tamsulosin and if voiding well but Jordan [name] has nocturia 2-3x.  His IPSS is 11.  Jordan [name] has had no further gross hematuria.  Jordan [name] had a CT hematuria study on 02/12/21 that showed some small stones but no other concerning findings.  Jordan [name] is for cystoscopy [name].   Jordan [name] has no GI complaints but is on a stool softener.  Jordan [name] has no weight loss or bone pain.  Jordan [name] has occasional hot flashes 2-3x daily.    11/30/20: Jordan [name] returns [name] for the history noted below.  His PSA is 1.3.  Jordan [name] completed EXRT on 11/07/20.  Jordan [name] remains  on firmagon and is due for an injection [name].  Jordan [name] has worsening LUTS with an IPSS of 17.  Jordan [name] was started on tamsulosin by Dr. Tammi Klippel but Jordan [name] still has nocturia x 3 and urgency.  Jordan [name] has no GI complaints but is on metamucil and is using some miralax to help reduce the need to strain.  Jordan [name] has no bone pain or weight loss but has neuropathy and arthritis.  Jordan [name] has a few hot flashes.   08/03/20: Jordan [name] returns [name] in f/u.  Jordan [name] is a month out from firmagon '240mg'$  and his PSA is down to 1.2 with a T of 22.  Jordan [name] is scheduled for a seed implant with [employer] on 09/08/20.  Jordan [name] is tolerating the injection well  with just mild hot flashes. Jordan [name] is voiding well with an IPSS of 2.  His UA is unremarkable.   06/29/20:  Jordan [name] returns [name] to discuss LT-ADT as a component of treatment of his Gleason 8 high volume prostate cancer.   I have reviewed the risks and side effects ADT in detail and will initiate firmagon [name] and continue that monthly.    06/08/20:  Jordan [name] returns [name] in f/u to discuss the results of his prostate biopsy and staging studies.  Jordan [name] was found to have a 35.12m prostate with 6/6 right cores positive for cancer.  5/6 were GG4 with up to 90% involvement and 1/6 was GG3.  Jordan [name] has high risk disease and has undergone staging with bone scan and CT with both being negative.  Jordan [name] has stage T2b N0 M0 cancer.  Jordan [name] has DM, CAD and HTN as well as morbid obesity but swims regularly and is compliant with his meds.  Jordan [name] should have a reasonable 5 year life expectancy.   Hx: Jordan [name]  is a 76yo male who is sent by Dr. HBerdine Addisonfor an increase in his PSA from 1.7 on 02/02/18 to 3.58 on 02/15/20.  Jordan [name] is voiding well with nocturia 1-2x.  His IPSS is 1. Jordan [name] has a good stream but it sprays a bit.  Jordan [name] has ED but is no longer responding to sildenafil.  Jordan [name] has had no UTI's or GU surgery.  His father had some prostate issues, possibly cancer, but Jordan [name] had other medical issues so Jordan [name] was not felt to be a biopsy candidate.    IPSS     Row  Name 12/20/21 1400         International Prostate Symptom Score   How often have you had the sensation of not emptying your bladder? Less than half the time     How often have you had to urinate less than every two hours? More than half the time     How often have you found you stopped and started again several times when you urinated? Less than 1 in 5 times     How often have you found it difficult to postpone urination? Less than half the time     How often have you had a weak urinary stream? Less than 1 in 5 times     How often have you had to strain to start urination? Less than 1 in 5 times     How many times did you typically get up at night to urinate? 3 Times     Total IPSS Score 14       Quality of Life due to urinary symptoms   If you were to spend the rest of your life with your urinary condition just the way it is now how would you feel about that? Mostly Satisfied                ROS:  Review of Systems  HENT:  Positive for congestion.   Musculoskeletal:  Positive for joint pain.    Allergies  Allergen Reactions   Codeine     Rash itching   Statins Other (See Comments)    Muscle aches Muscle aches    Past Medical History:  Diagnosis Date   Arthritis    all over oa   CAD (coronary artery disease)    Cancer ([place]    Prostate   Colon polyps 01/23/2010   Descending   Diverticulosis    dm type 2    Eczema 09/05/2020   Gastric ulcer 2010   GI bleed 2010   Hyperlipidemia    Hypertension    Hypotension    transient   Neuropathy 09/05/2020   both hands and feet   OSA (obstructive sleep apnea)    Severe, on CPAP therapy   Prostate cancer ([place]    Wears glasses 09/05/2020    Past  Surgical History:  Procedure Laterality Date   CARDIAC CATHETERIZATION  05/01/2007   Recommend rotational atherectomy followed by PTCA and probable stenting of LAD.   CARDIAC CATHETERIZATION  05/28/2007   Mid LAD hihgh grade 80-90% stenosis, stented with a 3x79m  Cypher stent implanted at 16atm, postdilated with a 3.25x240mQuantum at 16atm, which revealed excellent wall appoistion - 5.7938m CARDIOVASCULAR STRESS TEST  02/05/2011   Perfusion defect seen in inferior myocardial region consistent with diagphragmatic attenuation. Remaining myocardium demonstrates normal myocardial perfusion with no evidence of ischemia or infarct. ECG is positive for ischemia.   colonscopy  2012   CPAP/BIPAP SLEEP STUDY  07/01/2007   AHI-0.5/hr at 11cm water pressure, RDI-12.6/hr   CYSTOSCOPY  09/08/2020   Procedure: CYSTOSCOPY FLEXIBLE;  Surgeon: WreIrine SealD;  [facility];;  NO SEEDS FOUND IN BLADDER   LOWER ARTERIAL DOPPLER  06/03/2007   No evidence of dissection, AV fistula, or active pseudoaneurysm.   NASAL ENDOSCOPY  12/2008   RADIOACTIVE SEED IMPLANT N/A 09/08/2020   Procedure: RADIOACTIVE SEED IMPLANT/BRACHYTHERAPY IMPLANT;  Surgeon: WreIrine SealD;  [facility];  Laterality: N/A;  51 SEEDS IMPLANTED   SLEEP STUDY  05/13/2007   AHI-11.52/hr, AHI REM-56.0/hr, RDI-11.7/hr, RDI REM-56.0/hr, avg oxygen sat-94%   SPACE OAR INSTILLATION N/A 09/08/2020   Procedure: SPACE OAR INSTILLATION;  Surgeon: WreIrine SealD;  [facility];  Laterality: N/A;   stent to heart x 1  2010   TRANSTHORACIC ECHOCARDIOGRAM  03/13/2010   EF >55>00%ormal diastolic function, mild MR, mild TR, and normal RVSP    Social History   Socioeconomic History   Marital status: Married    Spouse name: Not on file   Number of children: 2   Years of education: Not on file   Highest education level: Not on file  Occupational History   Occupation: retired  Tobacco Use   Smoking status: Former    Packs/day: 0.50    Years: 16.00    Total pack years: 8.00    Types: Cigarettes    Quit date: 01/28/1978    Years since quitting: 43.9   Smokeless tobacco: Never  Vaping  Use   Vaping Use: Never used  Substance and Sexual Activity   Alcohol use: Not Currently   Drug use: No   Sexual activity: Yes  Other Topics Concern   Not on file  Social History Narrative   Not on file   Social Determinants of Health   Financial Resource Strain: Low Risk  (02/12/2021)   Overall Financial Resource Strain (CARDIA)    Difficulty of Paying Living Expenses: Not hard at all  Food Insecurity: No Food Insecurity (02/12/2021)   Hunger Vital Sign    Worried About Running Out of Food in the Last Year: Never true    [place] the Last Year: Never true  Transportation Needs: No Transportation Needs (02/12/2021)   PRAPARE - TraHydrologistedical): No    Lack of Transportation (Non-Medical): No  Physical Activity: Inactive (02/12/2021)   Exercise Vital Sign    Days of Exercise per Week: 0 days    Minutes of Exercise per Session: 0 min  Stress: No Stress Concern Present (02/12/2021)   [place] Feeling of Stress : Not at all  Social Connections: Moderately Integrated (02/12/2021)  Social Connection and Isolation Panel [NHANES]    Frequency of Communication with Friends and Family: More than three times a week    Frequency of Social Gatherings with Friends and Family: Three times a week    Attends Religious Services: 1 to 4 times per year    Active Member of Clubs or Organizations: No    Attends Archivist Meetings: Never    Marital Status: Married  Human resources officer Violence: Not At Risk (02/12/2021)   Humiliation, Afraid, Rape, and Kick questionnaire    Fear of Current or Ex-Partner: No    Emotionally Abused: No    Physically Abused: No    Sexually Abused: No    Family History  Problem Relation Age of Onset   Stroke Brother    Hypertension Brother    Alzheimer's disease Maternal Grandmother    Stroke Maternal Grandfather    Heart disease Maternal  Grandfather    Stroke Paternal Grandmother    Heart attack Paternal Grandfather    Hypertension Father    Alzheimer's disease Father    Parkinson's disease Father    Alzheimer's disease Mother    Pancreatic cancer Maternal Uncle    Breast cancer Neg Hx    Prostate cancer Neg Hx    Colon cancer Neg Hx     Anti-infectives: Anti-infectives (From admission, onward)    None       Current Outpatient Medications  Medication Sig Dispense Refill   ACCU-CHEK GUIDE test strip TEST AS DIRECTED ONCEODAILY.M     acetaminophen (TYLENOL) 650 MG CR tablet Take 650 mg by mouth every 8 (eight) hours. Pt takes 2 tablets in the morning and 2 tablets in the evening     Apoaequorin (PREVAGEN PO) Take 20 mg by mouth daily.     aspirin 81 MG tablet Take 81 mg by mouth daily.     calcium carbonate (OSCAL) 1500 (600 Ca) MG TABS tablet Take 1,500 mg by mouth 2 (two) times daily with a meal.     carvedilol (COREG) 3.125 MG tablet Take 1 tablet (3.125 mg total) by mouth 2 (two) times daily with a meal. 90 tablet 3   Cholecalciferol (VITAMIN D3) 25 MCG (1000 UT) CAPS Take 1 capsule by mouth 2 (two) times daily.     CINNAMON PO Take 2 tablets by mouth 2 (two) times daily. 1000 mg tablet     Degarelix Acetate (FIRMAGON [place]) Inject into the skin every 30 (thirty) days.     ezetimibe (ZETIA) 10 MG tablet TAKE ONE TABLET BY MOUTH ONCE DAILY. 90 tablet 3   ketoconazole (NIZORAL) 2 % cream Apply 1 application topically daily.     lisinopril (ZESTRIL) 5 MG tablet Take 1 tablet (5 mg total) by mouth daily. 45 tablet 3   metFORMIN (GLUCOPHAGE) 500 MG tablet 500 mg daily with breakfast.     mupirocin ointment (BACTROBAN) 2 % Apply 1 Application topically 2 (two) times daily. 30 g 2   pantoprazole (PROTONIX) 40 MG tablet Take 1 tablet (40 mg total) by mouth daily. 90 tablet 3   psyllium (METAMUCIL) 58.6 % packet Take by mouth daily. 1 Tablespoon daily     simvastatin (ZOCOR) 40 MG tablet TAKE (1) TABLET BY MOUTH AT  BEDTIME. 90 tablet 1   tamsulosin (FLOMAX) 0.4 MG CAPS capsule Take 1 capsule (0.4 mg total) by mouth daily after supper. 90 capsule 3   No current facility-administered medications for this visit.     Objective: Vital signs in last  24 hours: BP 129/82   Pulse 65   Intake/Output from previous day: No intake/output data recorded. Intake/Output this shift: '@IOTHISSHIFT'$ @   Physical Exam   Recent Results (from the past 2160 hour(s))  Testosterone     Status: Abnormal   Collection Time: 12/13/21  1:16 PM  Result Value Ref Range   Testosterone 23 (L) 264 - 916 ng/dL    Comment: Adult male reference interval is based on a population of healthy nonobese males (BMI <30) between 5 and 48 years old. [place], [place] [phone]. PMID: [id number].   PSA     Status: None   Collection Time: 12/13/21  1:16 PM  Result Value Ref Range   Prostate Specific Ag, Serum <0.1 0.0 - 4.0 ng/mL    Comment: Roche ECLIA methodology. According to the American Urological Association, Serum PSA should decrease and remain at undetectable levels after radical prostatectomy. The AUA defines biochemical recurrence as an initial PSA value 0.2 ng/mL or greater followed by a subsequent confirmatory PSA value 0.2 ng/mL or greater. Values obtained with different assay methods or kits cannot be used interchangeably. Results cannot be interpreted as absolute evidence of the presence or absence of malignant disease.   Urinalysis, Routine w reflex microscopic     Status: Abnormal   Collection Time: 12/20/21  3:34 PM  Result Value Ref Range   Specific Gravity, UA 1.020 1.005 - 1.030   pH, UA 5.5 5.0 - 7.5   Color, UA Yellow Yellow   Appearance Ur Clear Clear   Leukocytes,UA Negative Negative   Protein,UA Negative Negative/Trace   Glucose, UA Negative Negative   Ketones, UA Trace (A) Negative   RBC, UA Negative Negative   Bilirubin, UA Negative Negative   Urobilinogen, Ur 1.0 0.2 - 1.0 mg/dL    Nitrite, UA Negative Negative   Microscopic Examination Comment     Comment: Microscopic not indicated and not performed.    UA is clear.  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   Jordan [name] is tolerating firmagon and will continue that monthly for 51mo   Jordan [name] tolerated the EXRT and his LUTS have improved.  His PSA is down to <0.1.  I will have him return in 6 months with labs.    BPH with BOO and OAB.   Jordan [name] is doing well on tamsulosin which I have refilled.  Gross hematuria.    Jordan [name] had one very small episode.        Meds ordered this encounter  Medications   tamsulosin (FLOMAX) 0.4 MG CAPS capsule    Sig: Take 1 capsule (0.4 mg total) by mouth daily after supper.    Dispense:  90 capsule    Refill:  3     Orders Placed This Encounter  Procedures   Urinalysis, Routine w reflex microscopic   PSA    Standing Status:   Future    Standing Expiration Date:   12/21/2022   Testosterone    Standing Status:   Future    Standing Expiration Date:   12/21/2022     Return in about 6 months (around 06/21/2022) for with labs.    CC: Dr. GIona Beard Dr. MLedon Snare and Dr. TShelva Majestic    JIrine Seal10/27/[phone]: JZenia Resides, male   DOB: 6April 18, 1947 76y.o.   MRN: [id number]

## 2021-12-21 LAB — URINALYSIS, ROUTINE W REFLEX MICROSCOPIC
Bilirubin, UA: NEGATIVE
Glucose, UA: NEGATIVE
Leukocytes,UA: NEGATIVE
Nitrite, UA: NEGATIVE
Protein,UA: NEGATIVE
RBC, UA: NEGATIVE
Specific Gravity, UA: 1.02 (ref 1.005–1.030)
Urobilinogen, Ur: 1 mg/dL (ref 0.2–1.0)
pH, UA: 5.5 (ref 5.0–7.5)

## 2021-12-31 ENCOUNTER — Ambulatory Visit: Payer: Medicare HMO | Admitting: Podiatry

## 2021-12-31 DIAGNOSIS — E1149 Type 2 diabetes mellitus with other diabetic neurological complication: Secondary | ICD-10-CM

## 2021-12-31 DIAGNOSIS — M79675 Pain in left toe(s): Secondary | ICD-10-CM

## 2021-12-31 DIAGNOSIS — M79674 Pain in right toe(s): Secondary | ICD-10-CM

## 2021-12-31 DIAGNOSIS — L600 Ingrowing nail: Secondary | ICD-10-CM | POA: Diagnosis not present

## 2021-12-31 DIAGNOSIS — B351 Tinea unguium: Secondary | ICD-10-CM

## 2021-12-31 DIAGNOSIS — L84 Corns and callosities: Secondary | ICD-10-CM | POA: Diagnosis not present

## 2021-12-31 NOTE — Progress Notes (Unsigned)
  Subjective:  Patient ID: Jordan Resides., male    DOB: 07/28/1945,  MRN: [id number]  Jordan Resides. presents to clinic today for {jgcomplaint:23593}  Chief Complaint  Patient presents with   Nail Problem    RFC Bilateral nail trim. Both great toe nails have soreness from nail digging in bilateral hallux.  Pt didi not check glucose this morning.    New problem(s): None. {jgcomplaint:23593}  PCP is Iona Beard, MD , and last visit was {Time; dates multiple:15870}.  Allergies  Allergen Reactions   Codeine     Rash itching   Statins Other (See Comments)    Muscle aches Muscle aches    Review of Systems: Negative except as noted in the HPI.  Objective: No changes noted in today's physical examination.  Jordan Marius Betts. is a pleasant 76 y.o. male {jgbodyhabitus:24098} AAO x 3.  Vascular Examination: CFT <3 seconds b/l LE. Palpable DP/PT pulses b/l LE. Digital hair absent b/l. Skin temperature gradient WNL b/l. No pain with calf compression b/l. No edema noted b/l. No cyanosis or clubbing noted b/l LE.  Dermatological Examination: Pedal integument with normal turgor, texture and tone b/l LE. No open wounds b/l. No interdigital macerations b/l. Toenails 1-5 b/l elongated, thickened, discolored with subungual debris. +Tenderness with dorsal palpation of nailplates. Hyperkeratotic lesion(s) noted distally bilateral 2nd toes.  Musculoskeletal Examination: Muscle strength 5/5 to all lower extremity muscle groups bilaterally. Hammertoe deformity noted 2-5 b/l. Utilizes cane for ambulation assistance.  Neurological Examination: Protective sensation intact 5/5 intact bilaterally with 10g monofilament b/l.  Assessment/Plan: No diagnosis found.  No orders of the defined types were placed in this encounter.   {Jgplan:23602::"-Patient/POA to call should there be question/concern in the interim."}   No follow-ups on file.  Marzetta Board, DPM

## 2022-01-02 ENCOUNTER — Encounter: Payer: Self-pay | Admitting: Podiatry

## 2022-01-03 ENCOUNTER — Ambulatory Visit (INDEPENDENT_AMBULATORY_CARE_PROVIDER_SITE_OTHER): Payer: Medicare HMO | Admitting: Urology

## 2022-01-03 DIAGNOSIS — C61 Malignant neoplasm of prostate: Secondary | ICD-10-CM | POA: Diagnosis not present

## 2022-01-03 MED ORDER — DEGARELIX ACETATE 80 MG ~~LOC~~ SOLR
80.0000 mg | Freq: Once | SUBCUTANEOUS | Status: AC
  Administered 2022-01-03: 80 mg via SUBCUTANEOUS

## 2022-01-03 NOTE — Progress Notes (Addendum)
Firmagon Sub Q Injection  Due to Prostate Cancer patient is present today for a Firmagon Injection.   Medication: Mills Koller (Degarelix)  Dose: '80mg'$  Location: right upper abdomen Lot: [id number] Exp: 12/27/2023  Patient tolerated well, no complications were noted  Performed by: Levi Aland, CMA  Follow up: Follow up with MD in 6 months with Labs prior, last of 18 month firmagon injection.  I have reviewed the note and concur.

## 2022-01-16 ENCOUNTER — Other Ambulatory Visit: Payer: Self-pay | Admitting: Cardiovascular Disease

## 2022-02-05 ENCOUNTER — Ambulatory Visit: Payer: Medicare HMO

## 2022-02-21 DIAGNOSIS — G4733 Obstructive sleep apnea (adult) (pediatric): Secondary | ICD-10-CM | POA: Diagnosis not present

## 2022-02-21 DIAGNOSIS — C61 Malignant neoplasm of prostate: Secondary | ICD-10-CM | POA: Diagnosis not present

## 2022-02-21 DIAGNOSIS — I2510 Atherosclerotic heart disease of native coronary artery without angina pectoris: Secondary | ICD-10-CM | POA: Diagnosis not present

## 2022-02-21 DIAGNOSIS — R001 Bradycardia, unspecified: Secondary | ICD-10-CM | POA: Diagnosis not present

## 2022-02-21 DIAGNOSIS — M150 Primary generalized (osteo)arthritis: Secondary | ICD-10-CM | POA: Diagnosis not present

## 2022-02-21 DIAGNOSIS — M25512 Pain in left shoulder: Secondary | ICD-10-CM | POA: Diagnosis not present

## 2022-02-21 DIAGNOSIS — E6609 Other obesity due to excess calories: Secondary | ICD-10-CM | POA: Diagnosis not present

## 2022-02-21 DIAGNOSIS — E1169 Type 2 diabetes mellitus with other specified complication: Secondary | ICD-10-CM | POA: Diagnosis not present

## 2022-04-11 ENCOUNTER — Other Ambulatory Visit: Payer: Self-pay | Admitting: Cardiovascular Disease

## 2022-04-24 ENCOUNTER — Ambulatory Visit: Payer: Medicare HMO | Admitting: Podiatry

## 2022-04-24 ENCOUNTER — Encounter: Payer: Self-pay | Admitting: Podiatry

## 2022-04-24 VITALS — BP 135/79

## 2022-04-24 DIAGNOSIS — M2042 Other hammer toe(s) (acquired), left foot: Secondary | ICD-10-CM | POA: Diagnosis not present

## 2022-04-24 DIAGNOSIS — B351 Tinea unguium: Secondary | ICD-10-CM | POA: Diagnosis not present

## 2022-04-24 DIAGNOSIS — E119 Type 2 diabetes mellitus without complications: Secondary | ICD-10-CM

## 2022-04-24 DIAGNOSIS — L600 Ingrowing nail: Secondary | ICD-10-CM | POA: Diagnosis not present

## 2022-04-24 DIAGNOSIS — M2041 Other hammer toe(s) (acquired), right foot: Secondary | ICD-10-CM | POA: Diagnosis not present

## 2022-04-24 DIAGNOSIS — M79674 Pain in right toe(s): Secondary | ICD-10-CM | POA: Diagnosis not present

## 2022-04-24 DIAGNOSIS — E1149 Type 2 diabetes mellitus with other diabetic neurological complication: Secondary | ICD-10-CM | POA: Diagnosis not present

## 2022-04-24 DIAGNOSIS — M79675 Pain in left toe(s): Secondary | ICD-10-CM | POA: Diagnosis not present

## 2022-04-24 NOTE — Progress Notes (Unsigned)
ANNUAL DIABETIC FOOT EXAM  Subjective: Jordan [name]. presents today {jgcomplaint:23593}.  Chief Complaint  Patient presents with   Nail Problem    DFC BS-did not check today A1C-5.? PCP-Hill, Gerald PCP VST-02/21/2022    Patient confirms h/o diabetes.  Patient relates {Numbers; 0-100:15068} year h/o diabetes.  Patient denies any h/o foot wounds.  Patient has h/o foot ulcer of {jgPodToeLocator:23637}, which healed via help of ***.  Patient has h/o amputation(s): {jgamp:23617}.  Patient endorses symptoms of foot numbness.   Patient endorses symptoms of foot tingling.  Patient endorses symptoms of burning in feet.  Patient endorses symptoms of pins/needles sensation in feet.  Patient denies any numbness, tingling, burning, or pins/needle sensation in feet.  Patient has been diagnosed with neuropathy and it is managed with {JGNEUROPATHYMEDS:27053}.  Risk factors: {jgriskfactors:24044}.  Iona Beard, MD is patient's PCP. Last visit was {Time; dates multiple:15870}***.  Past Medical History:  Diagnosis Date   Arthritis    all over oa   CAD (coronary artery disease)    Cancer ([place])    Prostate   Colon polyps 01/23/2010   Descending   Diverticulosis    dm type 2    Eczema 09/05/2020   Gastric ulcer 2010   GI bleed 2010   Hyperlipidemia    Hypertension    Hypotension    transient   Neuropathy 09/05/2020   both hands and feet   OSA (obstructive sleep apnea)    Severe, on CPAP therapy   Prostate cancer ([place])    Wears glasses 09/05/2020   Patient Active Problem List   Diagnosis Date Noted   Osteoarthritis of left knee 09/12/2021   Pain in joint of left knee 09/07/2021   Elevated PSA 11/30/2020   Malignant neoplasm of prostate ([place]) 06/21/2020   Bilateral hand pain 06/17/2016   Neck pain 06/17/2016   Type 2 diabetes mellitus with other circulatory complications ([place]) 0000000   History of gastric ulcer 05/23/2014   OSA on CPAP 03/04/2013   Morbid  obesity ([place]) 03/04/2013   CAD (coronary artery disease) 03/04/2013   Hyperlipidemia with target LDL less than 70 03/04/2013   Type 2 diabetes mellitus ([place]) 03/04/2013   GERD (gastroesophageal reflux disease) 03/04/2013   Vertigo 03/04/2013   Acute gastric ulcer 03/28/2009   Past Surgical History:  Procedure Laterality Date   CARDIAC CATHETERIZATION  05/01/2007   Recommend rotational atherectomy followed by PTCA and probable stenting of LAD.   CARDIAC CATHETERIZATION  05/28/2007   Mid LAD hihgh grade 80-90% stenosis, stented with a 3x80m Cypher stent implanted at 16atm, postdilated with a 3.25x231mQuantum at 16atm, which revealed excellent wall appoistion - 5.7929m CARDIOVASCULAR STRESS TEST  02/05/2011   Perfusion defect seen in inferior myocardial region consistent with diagphragmatic attenuation. Remaining myocardium demonstrates normal myocardial perfusion with no evidence of ischemia or infarct. ECG is positive for ischemia.   colonscopy  2012   CPAP/BIPAP SLEEP STUDY  07/01/2007   AHI-0.5/hr at 11cm water pressure, RDI-12.6/hr   CYSTOSCOPY  09/08/2020   Procedure: CYSTOSCOPY FLEXIBLE;  Surgeon: WreIrine SealD;  [facility];;  NO SEEDS FOUND IN BLADDER   LOWER ARTERIAL DOPPLER  06/03/2007   No evidence of dissection, AV fistula, or active pseudoaneurysm.   NASAL ENDOSCOPY  12/2008   RADIOACTIVE SEED IMPLANT N/A 09/08/2020   Procedure: RADIOACTIVE SEED IMPLANT/BRACHYTHERAPY IMPLANT;  Surgeon: WreIrine SealD;  [facility];  Laterality: N/A;  51  SEEDS IMPLANTED   SLEEP STUDY  05/13/2007   AHI-11.52/hr, AHI REM-56.0/hr, RDI-11.7/hr, RDI REM-56.0/hr, avg oxygen sat-94%   SPACE OAR INSTILLATION N/A 09/08/2020   Procedure: SPACE OAR INSTILLATION;  Surgeon: Irine Seal, MD;  Location[facility];  Service: Urology;  Laterality: N/A;   stent to heart x 1  2010   TRANSTHORACIC  ECHOCARDIOGRAM  03/13/2010   EF 123456, normal diastolic function, mild MR, mild TR, and normal RVSP   Current Outpatient Medications on File Prior to Visit  Medication Sig Dispense Refill   ACCU-CHEK GUIDE test strip TEST AS DIRECTED ONCEODAILY.M     acetaminophen (TYLENOL) 650 MG CR tablet Take 650 mg by mouth every 8 (eight) hours. Pt takes 2 tablets in the morning and 2 tablets in the evening     Apoaequorin (PREVAGEN PO) Take 20 mg by mouth daily.     aspirin 81 MG tablet Take 81 mg by mouth daily.     calcium carbonate (OSCAL) 1500 (600 Ca) MG TABS tablet Take 1,500 mg by mouth 2 (two) times daily with a meal.     carvedilol (COREG) 3.125 MG tablet Take 1 tablet (3.125 mg total) by mouth 2 (two) times daily with a meal. 90 tablet 3   Cholecalciferol (VITAMIN D3) 25 MCG (1000 UT) CAPS Take 1 capsule by mouth 2 (two) times daily.     CINNAMON PO Take 2 tablets by mouth 2 (two) times daily. 1000 mg tablet     Degarelix Acetate (FIRMAGON [employer]) Inject into the skin every 30 (thirty) days.     ezetimibe (ZETIA) 10 MG tablet TAKE ONE TABLET BY MOUTH ONCE DAILY. 90 tablet 1   ketoconazole (NIZORAL) 2 % cream Apply 1 application topically daily.     lisinopril (ZESTRIL) 5 MG tablet Take 1 tablet (5 mg total) by mouth daily. 45 tablet 3   metFORMIN (GLUCOPHAGE) 500 MG tablet 500 mg daily with breakfast.     mupirocin ointment (BACTROBAN) 2 % Apply 1 Application topically 2 (two) times daily. 30 g 2   pantoprazole (PROTONIX) 40 MG tablet Take 1 tablet (40 mg total) by mouth daily. 90 tablet 3   psyllium (METAMUCIL) 58.6 % packet Take by mouth daily. 1 Tablespoon daily     simvastatin (ZOCOR) 40 MG tablet Take 1 tablet (40 mg total) by mouth daily at 6 PM. 90 tablet 0   tamsulosin (FLOMAX) 0.4 MG CAPS capsule Take 1 capsule (0.4 mg total) by mouth daily after supper. 90 capsule 3   No current facility-administered medications on file prior to visit.    Allergies  Allergen Reactions   Codeine      Rash itching   Statins Other (See Comments)    Muscle aches Muscle aches   Social History   Occupational History   Occupation: retired  Tobacco Use   Smoking status: Former    Packs/day: 0.50    Years: 16.00    Total pack years: 8.00    Types: Cigarettes    Quit date: 01/28/1978    Years since quitting: 44.2   Smokeless tobacco: Never  Vaping Use   Vaping Use: Never used  Substance and Sexual Activity   Alcohol use: Not Currently   Drug use: No   Sexual activity: Yes   Family History  Problem Relation Age of Onset   Stroke Brother    Hypertension Brother    Alzheimer's disease Maternal Grandmother    Stroke Maternal Grandfather    Heart disease Maternal  Grandfather    Stroke Paternal Grandmother    Heart attack Paternal Grandfather    Hypertension Father    Alzheimer's disease Father    Parkinson's disease Father    Alzheimer's disease Mother    Pancreatic cancer Maternal Uncle    Breast cancer Neg Hx    Prostate cancer Neg Hx    Colon cancer Neg Hx    Immunization History  Administered Date(s) Administered   Influenza-Unspecified 12/26/2017, 11/08/2020   Moderna Covid-19 Vaccine Bivalent Booster 61yr & up 12/06/2020     Review of Systems: Negative except as noted in the HPI.   Objective: Vitals:   04/24/22 0934  BP: 135/79   Yong MRomy Hallmark is a pleasant 77y.o. male in NAD. AAO X 3.  Vascular Examination: {jgvascular:23595}  Dermatological Examination: {jgderm:23598}  Neurological Examination: {jgneuro:23601::"Protective sensation intact 5/5 intact bilaterally with 10g monofilament b/l.","Vibratory sensation intact b/l.","Proprioception intact bilaterally."}  Musculoskeletal Examination: {jgmsk:23600}  Footwear Assessment: Does the patient wear appropriate shoes? {Yes,No}. Does the patient need inserts/orthotics? {Yes,No}.  No results found for: "HGBA1C" No results found. ADA Risk Categorization: Low Risk :  Patient has all of the  following: Intact protective sensation No prior foot ulcer  No severe deformity Pedal pulses present  High Risk  Patient has one or more of the following: Loss of protective sensation Absent pedal pulses Severe Foot deformity History of foot ulcer  Assessment: No diagnosis found.   Plan: No orders of the defined types were placed in this encounter.   No orders of the defined types were placed in this encounter.   None  {jgplan:23602::"-Patient/POA to call should there be question/concern in the interim."} Return in about 3 months (around 07/23/2022).  JMarzetta Board DPM

## 2022-04-25 ENCOUNTER — Encounter: Payer: Self-pay | Admitting: Radiology

## 2022-06-06 ENCOUNTER — Other Ambulatory Visit: Payer: Self-pay

## 2022-06-06 ENCOUNTER — Telehealth: Payer: Self-pay

## 2022-06-06 ENCOUNTER — Other Ambulatory Visit: Payer: Medicare HMO

## 2022-06-06 DIAGNOSIS — C61 Malignant neoplasm of prostate: Secondary | ICD-10-CM | POA: Diagnosis not present

## 2022-06-06 NOTE — Telephone Encounter (Signed)
Patient is now taking mounjaro 2.5 - please add this medication to patient chart.

## 2022-06-06 NOTE — Telephone Encounter (Signed)
Medication added

## 2022-06-07 LAB — PSA: Prostate Specific Ag, Serum: <0.1 ng/mL (ref 0.0–4.0)

## 2022-06-07 LAB — TESTOSTERONE: Testosterone: 384 ng/dL (ref 264–916)

## 2022-06-13 ENCOUNTER — Ambulatory Visit: Payer: Medicare HMO | Admitting: Urology

## 2022-06-13 VITALS — BP 119/65 | HR 72 | Ht 64.0 in | Wt 274.5 lb

## 2022-06-13 DIAGNOSIS — R351 Nocturia: Secondary | ICD-10-CM | POA: Diagnosis not present

## 2022-06-13 DIAGNOSIS — Z8546 Personal history of malignant neoplasm of prostate: Secondary | ICD-10-CM | POA: Diagnosis not present

## 2022-06-13 DIAGNOSIS — R3915 Urgency of urination: Secondary | ICD-10-CM | POA: Diagnosis not present

## 2022-06-13 DIAGNOSIS — N403 Nodular prostate with lower urinary tract symptoms: Secondary | ICD-10-CM

## 2022-06-13 DIAGNOSIS — C61 Malignant neoplasm of prostate: Secondary | ICD-10-CM | POA: Diagnosis not present

## 2022-06-13 LAB — URINALYSIS, ROUTINE W REFLEX MICROSCOPIC
Bilirubin, UA: NEGATIVE
Glucose, UA: NEGATIVE
Leukocytes,UA: NEGATIVE
Nitrite, UA: NEGATIVE
Protein,UA: NEGATIVE
RBC, UA: NEGATIVE
Specific Gravity, UA: 1.02 (ref 1.005–1.030)
Urobilinogen, Ur: 0.2 mg/dL (ref 0.2–1.0)
pH, UA: 5 (ref 5.0–7.5)

## 2022-06-13 NOTE — Progress Notes (Signed)
Subjective: 1. History of prostate cancer   2. 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 Urgency of urination   4. Nocturia    06/13/22: Marshawn returns today in f/u for his history below.  He has completed his course of adjuvant ADT in 11/23 and completed radiation in 9/22.  His T is up to 384 and the PSA remains <0.1.  He has fatigue. He has arthralgias but no bone pain or involuntary weight loss.  His IPSS is 9 with nocturia x 2.  He remains on tamsulosin.  His UA is clear.   12/20/21: Silas returns in f/u for history below.  He remains on ADT with firmagon and will get his last shot in November. His PSA remains undetectible and the testosterone is 23.  He has frequency q1.5-2hrs and nocturia x 3 but he is otherwise voiding well on tamsulosin with an IPSS of 14. He had a very small amount of blood in the urine about a month ago but none since. He had a hematuria w/u at the first of the year.  His weight is stable.  He has chronic arthralgias.  He has no GI complaints.  He continues to have hot flashes and altered taste along with reduced energy with the ADT.   4/13//23: Dyer returns today in f/u for the history below.  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  He will continue the ADT until 11/23.  His PSA <0.1 and his T is 17.  He has hot flashes but no other complaints.  His weight is stable.   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  His IPSS is 7 with nocturia x 2.  He has some urgency.  His UA is clear.   03/08/21: Ivann returns today in f/u.  He got his last firmagon on 02/01/21 and is due today and will continue it through the end of the year.  His PSA is down further to 0.2.  He remains on tamsulosin and if voiding well but he has nocturia 2-3x.  His IPSS is 11.  He has had no further gross hematuria.  He had a CT hematuria study on 02/12/21 that showed some small stones but no other  concerning findings.  He is for cystoscopy today.   He has no GI complaints but is on a stool softener.  He has no weight loss or bone pain.  He has occasional hot flashes 2-3x daily.    11/30/20: Milik returns today for the history noted below.  His PSA is 1.3.  He completed EXRT on 11/07/20.  He remains on firmagon and is due for an injection today.  He has worsening LUTS with an IPSS of 17.  He was started on tamsulosin by Dr. Kathrynn Running but he still has nocturia x 3 and urgency.  He has no GI complaints but is on metamucil and is using some miralax to help reduce the need to strain.  He has no bone pain or weight loss but has neuropathy and arthritis.  He has a few hot flashes.   08/03/20: Mr. Mussell returns today in f/u.  He is a month out from firmagon 240mg  and his PSA is down to 1.2 with a T of 22.  He is scheduled for a seed implant with [employer] on 09/08/20.  He is tolerating the injection well  with just mild hot flashes. He  is voiding well with an IPSS of 2.  His UA is unremarkable.   06/29/20:  Mr. Sleight returns today to discuss LT-ADT as a component of treatment of his Gleason 8 high volume prostate cancer.   I have reviewed the risks and side effects ADT in detail and will initiate firmagon today and continue that monthly.    06/08/20: Mr. Crysler returns today in f/u to discuss the results of his prostate biopsy and staging studies.  He was found to have a 35.1ml prostate with 6/6 right cores positive for cancer.  5/6 were GG4 with up to 90% involvement and 1/6 was GG3.  He has high risk disease and has undergone staging with bone scan and CT with both being negative.  He has stage T2b N0 M0 cancer.  He has DM, CAD and HTN as well as morbid obesity but swims regularly and is compliant with his meds.  He should have a reasonable 5 year life expectancy.   Hx: He  is a 77 yo male who is sent by Dr. Loleta Chance for an increase in his PSA from 1.7 on 02/02/18 to 3.58 on 02/15/20.  He is voiding well with nocturia  1-2x.  His IPSS is 1. He has a good stream but it sprays a bit.  He has ED but is no longer responding to sildenafil.  He has had no UTI's or GU surgery.  His father had some prostate issues, possibly cancer, but he had other medical issues so he was not felt to be a biopsy candidate.    IPSS     Row Name 06/13/22 1100         International Prostate Symptom Score   How often have you had the sensation of not emptying your bladder? Less than 1 in 5     How often have you had to urinate less than every two hours? Less than 1 in 5 times     How often have you found you stopped and started again several times when you urinated? Not at All     How often have you found it difficult to postpone urination? Less than half the time     How often have you had a weak urinary stream? Less than half the time     How often have you had to strain to start urination? Less than 1 in 5 times     How many times did you typically get up at night to urinate? 2 Times     Total IPSS Score 9       Quality of Life due to urinary symptoms   If you were to spend the rest of your life with your urinary condition just the way it is now how would you feel about that? Mostly Satisfied                 ROS:  Review of Systems  Constitutional:  Positive for malaise/fatigue.  HENT:  Positive for congestion.   Musculoskeletal:  Positive for joint pain.  Skin:  Positive for itching.  Neurological:  Positive for weakness.  Psychiatric/Behavioral:  Positive for memory loss.     Allergies  Allergen Reactions   Codeine     Rash itching   Statins Other (See Comments)    Muscle aches Muscle aches    Past Medical History:  Diagnosis Date   Arthritis    all over oa   CAD (coronary artery disease)    Cancer ([facility])  Prostate   Colon polyps 01/23/2010   Descending   Diverticulosis    dm type 2    Eczema 09/05/2020   Gastric ulcer 2010   GI bleed 2010   Hyperlipidemia    Hypertension    Hypotension     transient   Neuropathy 09/05/2020   both hands and feet   OSA (obstructive sleep apnea)    Severe, on CPAP therapy   Prostate cancer ([facility])    Wears glasses 09/05/2020    Past Surgical History:  Procedure Laterality Date   CARDIAC CATHETERIZATION  05/01/2007   Recommend rotational atherectomy followed by PTCA and probable stenting of LAD.   CARDIAC CATHETERIZATION  05/28/2007   Mid LAD hihgh grade 80-90% stenosis, stented with a 3x43mm Cypher stent implanted at 16atm, postdilated with a 3.25x39mm Quantum at 16atm, which revealed excellent wall appoistion - 5.30mm   CARDIOVASCULAR STRESS TEST  02/05/2011   Perfusion defect seen in inferior myocardial region consistent with diagphragmatic attenuation. Remaining myocardium demonstrates normal myocardial perfusion with no evidence of ischemia or infarct. ECG is positive for ischemia.   colonscopy  2012   CPAP/BIPAP SLEEP STUDY  07/01/2007   AHI-0.5/hr at 11cm water pressure, RDI-12.6/hr   CYSTOSCOPY  09/08/2020   Procedure: CYSTOSCOPY FLEXIBLE;  Surgeon: Bjorn Pippin, MD;  Location: [facility];  Service: Urology;;  NO SEEDS FOUND IN BLADDER   LOWER ARTERIAL DOPPLER  06/03/2007   No evidence of dissection, AV fistula, or active pseudoaneurysm.   NASAL ENDOSCOPY  12/2008   RADIOACTIVE SEED IMPLANT N/A 09/08/2020   Procedure: RADIOACTIVE SEED IMPLANT/BRACHYTHERAPY IMPLANT;  Surgeon: Bjorn Pippin, MD;  Location: [facility];  Service: Urology;  Laterality: N/A;  51 SEEDS IMPLANTED   SLEEP STUDY  05/13/2007   AHI-11.52/hr, AHI REM-56.0/hr, RDI-11.7/hr, RDI REM-56.0/hr, avg oxygen sat-94%   SPACE OAR INSTILLATION N/A 09/08/2020   Procedure: SPACE OAR INSTILLATION;  Surgeon: Bjorn Pippin, MD;  Location: [facility];  Service: Urology;  Laterality: N/A;   stent to heart x 1  2010   TRANSTHORACIC ECHOCARDIOGRAM  03/13/2010   EF >55%, normal diastolic function, mild MR, mild TR, and normal RVSP     Social History   Socioeconomic History   Marital status: Married    Spouse name: Not on file   Number of children: 2   Years of education: Not on file   Highest education level: Not on file  Occupational History   Occupation: retired  Tobacco Use   Smoking status: Former    Packs/day: 0.50    Years: 16.00    Additional pack years: 0.00    Total pack years: 8.00    Types: Cigarettes    Quit date: 01/28/1978    Years since quitting: 44.4   Smokeless tobacco: Never  Vaping Use   Vaping Use: Never used  Substance and Sexual Activity   Alcohol use: Not Currently   Drug use: No   Sexual activity: Yes  Other Topics Concern   Not on file  Social History Narrative   Not on file   Social Determinants of Health   Financial Resource Strain: Low Risk  (02/12/2021)   Overall Financial Resource Strain (CARDIA)    Difficulty of Paying Living Expenses: Not hard at all  Food Insecurity: No Food Insecurity (02/12/2021)   Hunger Vital Sign    Worried About Running Out of Food in the Last Year: Never true    Ran Out of Food in the Last Year: Never  true  Transportation Needs: No Transportation Needs (02/12/2021)   PRAPARE - Administrator, Civil Service (Medical): No    Lack of Transportation (Non-Medical): No  Physical Activity: Inactive (02/12/2021)   Exercise Vital Sign    Days of Exercise per Week: 0 days    Minutes of Exercise per Session: 0 min  Stress: No Stress Concern Present (02/12/2021)   [employer] of Occupational Health - Occupational Stress Questionnaire    Feeling of Stress : Not at all  Social Connections: Moderately Integrated (02/12/2021)   Social Connection and Isolation Panel [NHANES]    Frequency of Communication with Friends and Family: More than three times a week    Frequency of Social Gatherings with Friends and Family: Three times a week    Attends Religious Services: 1 to 4 times per year    Active Member of Clubs or Organizations:  No    Attends Banker Meetings: Never    Marital Status: Married  Catering manager Violence: Not At Risk (02/12/2021)   Humiliation, Afraid, Rape, and Kick questionnaire    Fear of Current or Ex-Partner: No    Emotionally Abused: No    Physically Abused: No    Sexually Abused: No    Family History  Problem Relation Age of Onset   Stroke Brother    Hypertension Brother    Alzheimer's disease Maternal Grandmother    Stroke Maternal Grandfather    Heart disease Maternal Grandfather    Stroke Paternal Grandmother    Heart attack Paternal Grandfather    Hypertension Father    Alzheimer's disease Father    Parkinson's disease Father    Alzheimer's disease Mother    Pancreatic cancer Maternal Uncle    Breast cancer Neg Hx    Prostate cancer Neg Hx    Colon cancer Neg Hx     Anti-infectives: Anti-infectives (From admission, onward)    None       Current Outpatient Medications  Medication Sig Dispense Refill   ACCU-CHEK GUIDE test strip TEST AS DIRECTED ONCEODAILY.M     acetaminophen (TYLENOL) 650 MG CR tablet Take 650 mg by mouth every 8 (eight) hours. Pt takes 2 tablets in the morning and 2 tablets in the evening     Apoaequorin (PREVAGEN PO) Take 20 mg by mouth daily.     aspirin 81 MG tablet Take 81 mg by mouth daily.     calcium carbonate (OSCAL) 1500 (600 Ca) MG TABS tablet Take 1,500 mg by mouth 2 (two) times daily with a meal.     carvedilol (COREG) 3.125 MG tablet Take 1 tablet (3.125 mg total) by mouth 2 (two) times daily with a meal. 90 tablet 3   Cholecalciferol (VITAMIN D3) 25 MCG (1000 UT) CAPS Take 1 capsule by mouth 2 (two) times daily.     CINNAMON PO Take 2 tablets by mouth 2 (two) times daily. 1000 mg tablet     Degarelix Acetate (FIRMAGON [place]) Inject into the skin every 30 (thirty) days.     ezetimibe (ZETIA) 10 MG tablet TAKE ONE TABLET BY MOUTH ONCE DAILY. 90 tablet 1   ketoconazole (NIZORAL) 2 % cream Apply 1 application topically daily.      lisinopril (ZESTRIL) 5 MG tablet Take 1 tablet (5 mg total) by mouth daily. 45 tablet 3   metFORMIN (GLUCOPHAGE) 500 MG tablet 500 mg daily with breakfast.     MOUNJARO 2.5 MG/0.5ML Pen SMARTSIG:2.5 Milligram(s) SUB-Q Once a Week  mupirocin ointment (BACTROBAN) 2 % Apply 1 Application topically 2 (two) times daily. 30 g 2   pantoprazole (PROTONIX) 40 MG tablet Take 1 tablet (40 mg total) by mouth daily. 90 tablet 3   psyllium (METAMUCIL) 58.6 % packet Take by mouth daily. 1 Tablespoon daily     simvastatin (ZOCOR) 40 MG tablet Take 1 tablet (40 mg total) by mouth daily at 6 PM. 90 tablet 0   tamsulosin (FLOMAX) 0.4 MG CAPS capsule Take 1 capsule (0.4 mg total) by mouth daily after supper. 90 capsule 3   No current facility-administered medications for this visit.     Objective: Vital signs in last 24 hours: BP 119/65   Pulse 72   Ht 5\' 4"  (1.626 m)   Wt 274 lb 8 oz (124.5 kg)   BMI 47.12 kg/m   Intake/Output from previous day: No intake/output data recorded. Intake/Output this shift: @IOTHISSHIFT @   Physical Exam   Recent Results (from the past 2160 hour(s))  PSA     Status: None   Collection Time: 06/06/22 10:13 AM  Result Value Ref Range   Prostate Specific Ag, Serum <0.1 0.0 - 4.0 ng/mL    Comment: Roche ECLIA methodology. According to the American Urological Association, Serum PSA should decrease and remain at undetectable levels after radical prostatectomy. The AUA defines biochemical recurrence as an initial PSA value 0.2 ng/mL or greater followed by a subsequent confirmatory PSA value 0.2 ng/mL or greater. Values obtained with different assay methods or kits cannot be used interchangeably. Results cannot be interpreted as absolute evidence of the presence or absence of malignant disease.   Testosterone     Status: None   Collection Time: 06/06/22 10:13 AM  Result Value Ref Range   Testosterone 384 264 - 916 ng/dL    Comment: Adult male reference interval  is based on a population of healthy nonobese males (BMI <30) between 49 and 65 years old. Travison, et.al. JCEM [phone]. PMID: [id number].   Urinalysis, Routine w reflex microscopic     Status: Abnormal   Collection Time: 06/13/22 10:47 AM  Result Value Ref Range   Specific Gravity, UA 1.020 1.005 - 1.030   pH, UA 5.0 5.0 - 7.5   Color, UA Amber (A) Yellow   Appearance Ur Clear Clear   Leukocytes,UA Negative Negative   Protein,UA Negative Negative/Trace   Glucose, UA Negative Negative   Ketones, UA Trace (A) Negative   RBC, UA Negative Negative   Bilirubin, UA Negative Negative   Urobilinogen, Ur 0.2 0.2 - 1.0 mg/dL   Nitrite, UA Negative Negative   Microscopic Examination Comment     Comment: Microscopic not indicated and not performed.    UA is clear.  Studies/Results:    Assessment/Plan: Hx of T2b N0 M0 GG4 high risk prostate cancer.   His testosterone has recovered and his PSA remains undetectible.  He has some residual fatigue.    BPH with BOO and OAB.   He is doing well on tamsulosin.  Gross hematuria.    He has had no further bleeding.        No orders of the defined types were placed in this encounter.    Orders Placed This Encounter  Procedures   Urinalysis, Routine w reflex microscopic   PSA    Standing Status:   Future    Standing Expiration Date:   06/13/2023     Return in about 6 months (around 12/13/2022) for with labs.    CC: Dr. Earvin Hansen  Hill, Dr. Margaretmary Bayley  and Dr. Nicki Guadalajara.    Bjorn Pippin 06/14/2022 [phone] ID: Leretha Dykes., male   DOB: 1945-04-21, 77 y.o.   MRN: [id number]

## 2022-06-23 ENCOUNTER — Other Ambulatory Visit: Payer: Self-pay | Admitting: Cardiovascular Disease

## 2022-06-24 DIAGNOSIS — M150 Primary generalized (osteo)arthritis: Secondary | ICD-10-CM | POA: Diagnosis not present

## 2022-06-24 DIAGNOSIS — E6609 Other obesity due to excess calories: Secondary | ICD-10-CM | POA: Diagnosis not present

## 2022-06-24 DIAGNOSIS — Z6841 Body Mass Index (BMI) 40.0 and over, adult: Secondary | ICD-10-CM | POA: Diagnosis not present

## 2022-06-24 DIAGNOSIS — Z0001 Encounter for general adult medical examination with abnormal findings: Secondary | ICD-10-CM | POA: Diagnosis not present

## 2022-06-24 DIAGNOSIS — I2510 Atherosclerotic heart disease of native coronary artery without angina pectoris: Secondary | ICD-10-CM | POA: Diagnosis not present

## 2022-06-24 DIAGNOSIS — R001 Bradycardia, unspecified: Secondary | ICD-10-CM | POA: Diagnosis not present

## 2022-06-24 DIAGNOSIS — I10 Essential (primary) hypertension: Secondary | ICD-10-CM | POA: Diagnosis not present

## 2022-06-24 DIAGNOSIS — C61 Malignant neoplasm of prostate: Secondary | ICD-10-CM | POA: Diagnosis not present

## 2022-06-24 DIAGNOSIS — G4733 Obstructive sleep apnea (adult) (pediatric): Secondary | ICD-10-CM | POA: Diagnosis not present

## 2022-06-24 DIAGNOSIS — E1169 Type 2 diabetes mellitus with other specified complication: Secondary | ICD-10-CM | POA: Diagnosis not present

## 2022-06-26 ENCOUNTER — Encounter: Payer: Self-pay | Admitting: Cardiovascular Disease

## 2022-06-26 ENCOUNTER — Ambulatory Visit: Payer: Medicare HMO | Attending: Cardiovascular Disease | Admitting: Cardiovascular Disease

## 2022-06-26 VITALS — BP 100/65 | HR 62 | Ht 64.0 in | Wt 267.0 lb

## 2022-06-26 DIAGNOSIS — C61 Malignant neoplasm of prostate: Secondary | ICD-10-CM

## 2022-06-26 DIAGNOSIS — G4733 Obstructive sleep apnea (adult) (pediatric): Secondary | ICD-10-CM | POA: Diagnosis not present

## 2022-06-26 DIAGNOSIS — I2510 Atherosclerotic heart disease of native coronary artery without angina pectoris: Secondary | ICD-10-CM | POA: Diagnosis not present

## 2022-06-26 DIAGNOSIS — E785 Hyperlipidemia, unspecified: Secondary | ICD-10-CM

## 2022-06-26 DIAGNOSIS — I2584 Coronary atherosclerosis due to calcified coronary lesion: Secondary | ICD-10-CM

## 2022-06-26 DIAGNOSIS — K219 Gastro-esophageal reflux disease without esophagitis: Secondary | ICD-10-CM | POA: Diagnosis not present

## 2022-06-26 DIAGNOSIS — E1159 Type 2 diabetes mellitus with other circulatory complications: Secondary | ICD-10-CM

## 2022-06-26 NOTE — Progress Notes (Signed)
Patient ID: Jordan [name]., male   DOB: 1945/11/27, 77 y.o.   MRN: [id number]       Primary MD: Dr. Loleta Chance  HPI: Jordan [name]. is a 77 y.o. male who presents to the office today for a 74 month cardiology and pre-operative evaluation.  Jordan [name] has a history of CAD , severe obstructive sleep apnea, morbid obesity, type 2 diabetes mellitus, mild essential hypertension, GERD, as well as erectile dysfunction. In April 2009 he was found to have high-grade focally calcified LAD stenosis and underwent successful high-speed rotational atherectomy and DES stenting with insertion of a 3.033 mm Cypher stent.  He has continued to be on long-term antiplatelet therapy with aspirin and Plavix.  He continues to be active.  He denies any recurrent anginal type symptomatology. His nuclear stress test in 2012 continue to suggest patency of this vessel with normal perfusion.  A two-year followup nuclear perfusion study  on 01/28/2013 demonstrated  normal perfusion without scar or ischemia;  EF was 56%.  He has a history of severe  obstructive sleep apnea originally diagnosed in 2009 and he has been on CPAP therapy since that time with 100% compliance.  He recently received a new CPAP machine after his old machine had begun to malfunction.  He was recently placed on a nasal mask and is followed by Dr.Domeiher.  He has a history of hyperlipidemia and when last seen had begun to have issues with simvastatin causing some myalgias.  I suggest that he change this to Crestor and he is now been on a very low-dose at 5 mg and has been taking this 2 times per week.  He has tolerated this.  Repeat blood work in May 2017 showed a total cholesterol of 153, triglycerides 80, HDL 45, and LDL 92.  An echo Doppler study on 05/11/2015 showed an ejection fraction at 55%; there were no regional wall motion abnormalities but there was  grade 2 diastolic dysfunction.  There was mitral annular calcification without regurgitation.  He  normal pulmonic pressures.  His aortic valve was normal.  In the past.  He has had bilateral knee discomfort and  underwent several injections as well as physical therapy for improvement.  He continues to be followed Dr. [name] , for primary care.  I  saw him in November 2018 at which time he was continuing to do well from a cardiac standpoint and was without chest pain or shortness of breath.   When I last saw him in October 2019 Jordan [name] continued to do well from a cardiac standpoint.  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  He continued to swim at least 5 days/week and 1/2 mile a time doing doggy paddle.  He does admit to some fatigability.  He continued to use CPAP with 100% compliance and uses a nasal mask.  He was on aspirin and Plavix for antiplatelet therapy, lisinopril 2.5 mg for hypertension.  He is diabetic on metformin.  He continued to take rosuvastatin 5 mg for hyperlipidemia and Protonix for GERD.    I saw him in November 2020.  At that time he was continuing to do well and had  purposeful weight loss losing over 45 pounds.  He continued to use CPAP with 100% compliance by Dr. Vickey Huger for his sleep apnea.  In September 2020 lipid studies were excellent with a total cholesterol 113 HDL 49 LDL 51 and triglycerides 53.  Hemoglobin A1c was stable at 5.7 and he  had normal renal function with a creatinine of 0.84.  I saw him in March 2022 at which time he remained stable from a cardiovascular standpoint and denied any chest pain, PND, orthopnea or palpitations.  He was continuing to swim.  He is in need to undergo a prostate biopsy with Dr. Bjorn Pippin to further evaluate a small nodule.  He was continuing to use CPAP.  I last saw him on May 21, 2021.  Since his prior evaluation he was diagnosed with prostate CA stage II and is followed by Dr. Annabell Howells.  He had undergone his radiation seed implant and has also had 25 radiation treatments.  He apparently stopped using CPAP therapy.  He  states he is sleeping well.  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  He has not been swimming.  He denies any chest pain or palpitations.  An Epworth Sleepiness Scale score was calculated in the office and this endorsed at 7 arguing against residual daytime sleepiness.   As only, Jordan [name] feels well.  He denies any chest pain or significant shortness of breath.  He has had issues with his knees and may ultimately require future knee surgery.  He has seen Dr. Linna Caprice for orthopedic evaluation and was told that prior to surgery he would need to lose at least 40 pounds.  He sees Dr. [name] for primary care.  He recently underwent laboratory on June 24, 2022.  Lipid studies were excellent with total cholesterol 96, HDL 52, triglycerides 80, and LDL 28.  He has continued to be on simvastatin 40 mg daily.  Hemoglobin A1c was 6.0.  Approximately 6 weeks ago he was recently started on Mounjaro.  He has lost approximately 8 to 10 pounds since initiation.  He continues to be on metformin as well for his diabetes.  He is on carvedilol 3.125 twice a day, lisinopril 5 mg daily for blood pressure and CAD.  He is on pantoprazole for GERD.  He presents for yearly evaluation.  Past Medical History:  Diagnosis Date   Arthritis    all over oa   CAD (coronary artery disease)    Cancer (HCC)    Prostate   Colon polyps 01/23/2010   Descending   Diverticulosis    dm type 2    Eczema 09/05/2020   Gastric ulcer 2010   GI bleed 2010   Hyperlipidemia    Hypertension    Hypotension    transient   Neuropathy 09/05/2020   both hands and feet   OSA (obstructive sleep apnea)    Severe, on CPAP therapy   Prostate cancer (HCC)    Wears glasses 09/05/2020    Past Surgical History:  Procedure Laterality Date   CARDIAC CATHETERIZATION  05/01/2007   Recommend rotational atherectomy followed by PTCA and probable stenting of LAD.   CARDIAC CATHETERIZATION   05/28/2007   Mid LAD hihgh grade 80-90% stenosis, stented with a 3x28mm Cypher stent implanted at 16atm, postdilated with a 3.25x32mm Quantum at 16atm, which revealed excellent wall appoistion - 5.44mm   CARDIOVASCULAR STRESS TEST  02/05/2011   Perfusion defect seen in inferior myocardial region consistent with diagphragmatic attenuation. Remaining myocardium demonstrates normal myocardial perfusion with no evidence of ischemia or infarct. ECG is positive for ischemia.   colonscopy  2012   CPAP/BIPAP SLEEP STUDY  07/01/2007   AHI-0.5/hr at 11cm water pressure, RDI-12.6/hr   CYSTOSCOPY  09/08/2020   Procedure: CYSTOSCOPY  FLEXIBLE;  Surgeon: Bjorn Pippin, MD;  Location: [facility];  Service: Urology;;  NO SEEDS FOUND IN BLADDER   LOWER ARTERIAL DOPPLER  06/03/2007   No evidence of dissection, AV fistula, or active pseudoaneurysm.   NASAL ENDOSCOPY  12/2008   RADIOACTIVE SEED IMPLANT N/A 09/08/2020   Procedure: RADIOACTIVE SEED IMPLANT/BRACHYTHERAPY IMPLANT;  Surgeon: Bjorn Pippin, MD;  Location: [facility];  Service: Urology;  Laterality: N/A;  51 SEEDS IMPLANTED   SLEEP STUDY  05/13/2007   AHI-11.52/hr, AHI REM-56.0/hr, RDI-11.7/hr, RDI REM-56.0/hr, avg oxygen sat-94%   SPACE OAR INSTILLATION N/A 09/08/2020   Procedure: SPACE OAR INSTILLATION;  Surgeon: Bjorn Pippin, MD;  Location: [facility];  Service: Urology;  Laterality: N/A;   stent to heart x 1  2010   TRANSTHORACIC ECHOCARDIOGRAM  03/13/2010   EF >55%, normal diastolic function, mild MR, mild TR, and normal RVSP    Allergies  Allergen Reactions   Codeine     Rash itching   Statins Other (See Comments)    Muscle aches Muscle aches    Current Outpatient Medications  Medication Sig Dispense Refill   ACCU-CHEK GUIDE test strip TEST AS DIRECTED ONCEODAILY.M     acetaminophen (TYLENOL) 650 MG CR tablet Take 650 mg by mouth every 8 (eight) hours. Pt takes 2 tablets in the morning and 2  tablets in the evening     Apoaequorin (PREVAGEN PO) Take 20 mg by mouth daily.     aspirin 81 MG tablet Take 81 mg by mouth daily.     calcium carbonate (OSCAL) 1500 (600 Ca) MG TABS tablet Take 1,500 mg by mouth 2 (two) times daily with a meal.     carvedilol (COREG) 3.125 MG tablet Take 1 tablet (3.125 mg total) by mouth 2 (two) times daily with a meal. 90 tablet 3   Cholecalciferol (VITAMIN D3) 25 MCG (1000 UT) CAPS Take 1 capsule by mouth 2 (two) times daily.     CINNAMON PO Take 2 tablets by mouth 2 (two) times daily. 1000 mg tablet     Degarelix Acetate (FIRMAGON [place]) Inject into the skin every 30 (thirty) days.     ezetimibe (ZETIA) 10 MG tablet TAKE ONE TABLET BY MOUTH ONCE DAILY. 90 tablet 1   ketoconazole (NIZORAL) 2 % cream Apply 1 application topically daily.     lisinopril (ZESTRIL) 5 MG tablet Take 1 tablet (5 mg total) by mouth daily. 45 tablet 3   metFORMIN (GLUCOPHAGE) 500 MG tablet 500 mg daily with breakfast.     MOUNJARO 2.5 MG/0.5ML Pen SMARTSIG:2.5 Milligram(s) SUB-Q Once a Week     mupirocin ointment (BACTROBAN) 2 % Apply 1 Application topically 2 (two) times daily. 30 g 2   pantoprazole (PROTONIX) 40 MG tablet Take 1 tablet (40 mg total) by mouth daily. 90 tablet 3   psyllium (METAMUCIL) 58.6 % packet Take by mouth daily. 1 Tablespoon daily     simvastatin (ZOCOR) 40 MG tablet Take 1 tablet (40 mg total) by mouth daily at 6 PM. 90 tablet 0   tamsulosin (FLOMAX) 0.4 MG CAPS capsule Take 1 capsule (0.4 mg total) by mouth daily after supper. 90 capsule 3   tirzepatide (MOUNJARO) 5 MG/0.5ML Pen Inject 5 mg into the skin once a week.     No current facility-administered medications for this visit.    Social History   Socioeconomic History   Marital status: Married    Spouse name: Not on file   Number  of children: 2   Years of education: Not on file   Highest education level: Not on file  Occupational History   Occupation: retired  Tobacco Use   Smoking status:  Former    Packs/day: 0.50    Years: 16.00    Additional pack years: 0.00    Total pack years: 8.00    Types: Cigarettes    Quit date: 01/28/1978    Years since quitting: 44.4   Smokeless tobacco: Never  Vaping Use   Vaping Use: Never used  Substance and Sexual Activity   Alcohol use: Not Currently   Drug use: No   Sexual activity: Yes  Other Topics Concern   Not on file  Social History Narrative   Not on file   Social Determinants of Health   Financial Resource Strain: Low Risk  (02/12/2021)   Overall Financial Resource Strain (CARDIA)    Difficulty of Paying Living Expenses: Not hard at all  Food Insecurity: No Food Insecurity (02/12/2021)   Hunger Vital Sign    Worried About Running Out of Food in the Last Year: Never true    Ran Out of Food in the Last Year: Never true  Transportation Needs: No Transportation Needs (02/12/2021)   PRAPARE - Administrator, Civil Service (Medical): No    Lack of Transportation (Non-Medical): No  Physical Activity: Inactive (02/12/2021)   Exercise Vital Sign    Days of Exercise per Week: 0 days    Minutes of Exercise per Session: 0 min  Stress: No Stress Concern Present (02/12/2021)   [employer] of Occupational Health - Occupational Stress Questionnaire    Feeling of Stress : Not at all  Social Connections: Moderately Integrated (02/12/2021)   Social Connection and Isolation Panel [NHANES]    Frequency of Communication with Friends and Family: More than three times a week    Frequency of Social Gatherings with Friends and Family: Three times a week    Attends Religious Services: 1 to 4 times per year    Active Member of Clubs or Organizations: No    Attends Banker Meetings: Never    Marital Status: Married  Catering manager Violence: Not At Risk (02/12/2021)   Humiliation, Afraid, Rape, and Kick questionnaire    Fear of Current or Ex-Partner: No    Emotionally Abused: No    Physically Abused: No     Sexually Abused: No   Socially he is married and has 2 children. No tobacco or alcohol use.  Family History  Problem Relation Age of Onset   Stroke Brother    Hypertension Brother    Alzheimer's disease Maternal Grandmother    Stroke Maternal Grandfather    Heart disease Maternal Grandfather    Stroke Paternal Grandmother    Heart attack Paternal Grandfather    Hypertension Father    Alzheimer's disease Father    Parkinson's disease Father    Alzheimer's disease Mother    Pancreatic cancer Maternal Uncle    Breast cancer Neg Hx    Prostate cancer Neg Hx    Colon cancer Neg Hx     ROS General: Negative; No fevers, chills, or night sweats; mild fatigue HEENT: Negative; No changes in vision or hearing, sinus congestion, difficulty swallowing Pulmonary: Negative; No cough, wheezing, shortness of breath, hemoptysis Cardiovascular: See history of present illness; No chest pain, presyncope, syncope, palpitations GI: GERD, controlled with pantoprazole GU: Positive for erectile dysfunction; stage II prostate CA, status post radiation Musculoskeletal: Occasional  knee discomfort Hematologic/Oncology: Negative; no easy bruising, bleeding Endocrine: Negative; no heat/cold intolerance; no diabetes Neuro: Neuropathy of hands Skin: Negative; No rashes or skin lesions Psychiatric: Negative; No behavioral problems, deprression. Sleep: Positive for obstructive sleep apnea which is severe, previously on CPAP.  Stop using CPAP.  A download was obtained which showed only 1 day of use since November 2022.   Other comprehensive 14 point system review is negative.  PE BP 100/65   Pulse 62   Ht 5\' 4"  (1.626 m)   Wt 267 lb (121.1 kg)   SpO2 97%   BMI 45.83 kg/m    Repeat blood pressure by me 106/68  Wt Readings from Last 3 Encounters:  06/26/22 267 lb (121.1 kg)  06/13/22 274 lb 8 oz (124.5 kg)  10/31/21 274 lb 8 oz (124.5 kg)   General: Alert, oriented, no distress.  Morbidly obese with  BMI 45.8 Skin: normal turgor, no rashes, warm and dry HEENT: Normocephalic, atraumatic. Pupils equal round and reactive to light; sclera anicteric; extraocular muscles intact;  Nose without nasal septal hypertrophy Mouth/Parynx benign; Mallinpatti scale 3 Neck: No JVD, no carotid bruits; normal carotid upstroke Lungs: clear to ausculatation and percussion; no wheezing or rales Chest wall: without tenderness to palpitation Heart: PMI not displaced, RRR, s1 s2 normal, 1/6 systolic murmur, no diastolic murmur, no rubs, gallops, thrills, or heaves Abdomen: Central adiposity; soft, nontender; no hepatosplenomehaly, BS+; abdominal aorta nontender and not dilated by palpation. Back: no CVA tenderness Pulses 2+ Musculoskeletal: Left knee brace, normal strength, no joint deformities Extremities: no clubbing cyanosis or edema, Homan's sign negative  Neurologic: grossly nonfocal; Cranial nerves grossly wnl Psychologic: Normal mood and Affect   Jun 26, 2022   ECG (independently read by me): NSR at 63   May 21, 2021 ECG (independently read by me): NSR at 62, no ectopy  April 25, 2020 ECG (independently read by me): Sinus bradycardia at 49  November 2020 ECG (independently read by me): Sinus Bradycardia ay 56; No ST changes; no ectopy; normal intervals    October 2019 ECG (independently read by me): Sinus bradycardia at 46 bpm.  No ectopy.  Normal intervals.  November 2018 ECG (independently read by me): Sinus bradycardia 43 bpm normal intervals.  No ST segment changes.  May 2018 ECG (independently read by me): Sinus bradycardia 47 bpm.  No ST segment changes.  Normal intervals.  ECG (independently read by me): Sinus bradycardia 52 bpm.  No significant ST changes.  Normal intervals.  June 2017 ECG (independently read by me): Sinus bradycardia 50 bpm.  No ectopy.  February 2017 ECG (independently read by me):  Sinus bradycardia with occasional PVC.  Heart rate 55 bpm.  QTc interval 369  ms.  February 2016 ECG (independently read by me): Sinus bradycardia 50 bpm.  No ectopy.  Normal intervals.   LABS:     Latest Ref Rng & Units 02/12/2021    9:46 AM 09/05/2020    9:22 AM 06/01/2020    9:46 AM  BMP  Glucose 70 - 99 mg/dL  161    BUN 8 - 23 mg/dL  12    Creatinine 0.96 - 1.24 mg/dL 0.45  4.09  8.11   Sodium 135 - 145 mmol/L  141    Potassium 3.5 - 5.1 mmol/L  4.1    Chloride 98 - 111 mmol/L  105    CO2 22 - 32 mmol/L  29    Calcium 8.9 - 10.3 mg/dL  9.5  Latest Ref Rng & Units 09/05/2020    9:22 AM 10/03/2016    7:40 AM 11/17/2015    7:49 AM  Hepatic Function  Total Protein 6.5 - 8.1 g/dL 7.9  6.8  7.0   Albumin 3.5 - 5.0 g/dL 4.1  3.8  3.8   AST 15 - 41 U/L 19  15  16    ALT 0 - 44 U/L 19  14  14    Alk Phosphatase 38 - 126 U/L 58  67  65   Total Bilirubin 0.3 - 1.2 mg/dL 0.6  0.4  0.6       Latest Ref Rng & Units 09/05/2020    9:22 AM 03/28/2009    3:19 PM 01/26/2009    5:00 AM  CBC  WBC 4.0 - 10.5 K/uL 8.0  10.3  10.8   Hemoglobin 13.0 - 17.0 g/dL 16.1  09.6  8.4   Hematocrit 39.0 - 52.0 % 44.3  40.9  24.8   Platelets 150 - 400 K/uL 238  234.0  190    Lab Results  Component Value Date   MCV 91.3 09/05/2020   MCV 90.6 03/28/2009   MCV 91.6 01/26/2009   No results found for: "TSH"   No results found for: "HGBA1C"  Lipid Panel     Component Value Date/Time   CHOL 108 10/03/2016 0740   TRIG 48 10/03/2016 0740   HDL 49 10/03/2016 0740   CHOLHDL 2.2 10/03/2016 0740   VLDL 10 10/03/2016 0740   LDLCALC 49 10/03/2016 0740     RADIOLOGY: No results found.  IMPRESSION:  1. Coronary artery disease due to calcified coronary lesion   2. Morbid obesity (HCC)   3. Hyperlipidemia with target LDL less than 70   4. Type 2 diabetes mellitus with other circulatory complications (HCC)   5. Prostate CA (HCC)   6. OSA (obstructive sleep apnea)   7. Gastroesophageal reflux disease, unspecified whether esophagitis present     ASSESSMENT AND PLAN: Mr.  Zaim [name] is a70 year-old African-American male who has a  history of morbid obesity, hypertension, obstructive sleep apnea, and CAD.  He underwent high-speed rotational artherectomy of the severely calcified focal LAD stenosis with insertion of a 3.0x30 mm DES Cypher stent in April 2009.  He has continued to do exceptionally well and and has been without recurrent anginal symptomatology. A nuclear perfusion study in December 2014  demonstrated normal perfusion without scar or ischemia, ejection fraction was 56%.  He denies any change and is without chest pain palpitations pre-syncope or syncope.  Mostly, he had been on CPAP therapy for obstructive sleep apnea and self discontinued this.  He believes he is sleeping well.  He has type 2 diabetes mellitus and is on metformin 500 mg daily and recently was started on Mounjaro weekly injection.  Most recent hemoglobin A1c was 6.0.  He has lost 8 to 10 pounds since initiation of therapy.  He continues on simvastatin for hyperlipidemia.  Lipid studies from June 24, 2022 were excellent with LDL cholesterol at 28.  Hemoglobin A1c was 6.0.  Creatinine was stable at 0.79 with potassium 4.6.  He is continue to be on carvedilol and low-dose lisinopril 5 mg daily.  He continues to be followed by Dr. Bjorn Pippin with his prostate CA undergone radiotherapy in addition to radiation implants.  Apparently this has been stable.  He is tells me he has difficulty with his knees left greater than right.  He is wearing a mild base support  on his left knee today.  He has seen Dr. Linna Caprice and was advised that he must lose at least 40 pounds prior to consideration of surgery.  Will continue current therapy.  I will see him in 1 year for follow-up Cardiologic evaluation or sooner as needed.    Lennette Bihari, MD, [facility]  06/26/2022 11:16 AM

## 2022-06-26 NOTE — Patient Instructions (Signed)
Medication Instructions:  Continue same medications *If you need a refill on your cardiac medications before your next appointment, please call your pharmacy*   Lab Work: None orderd   Testing/Procedures: None ordered   Follow-Up: At [facility], you and your health needs are our priority.  As part of our continuing mission to provide you with exceptional heart care, we have created designated Provider Care Teams.  These Care Teams include your primary Cardiologist (physician) and Advanced Practice Providers (APPs -  Physician Assistants and Nurse Practitioners) who all work together to provide you with the care you need, when you need it.  We recommend signing up for the patient portal called "MyChart".  Sign up information is provided on this After Visit Summary.  MyChart is used to connect with patients for Virtual Visits (Telemedicine).  Patients are able to view lab/test results, encounter notes, upcoming appointments, etc.  Non-urgent messages can be sent to your provider as well.   To learn more about what you can do with MyChart, go to [email or web address].    Your next appointment:  1 year    Call in Jan to schedule May appointment     Provider:  [facility]

## 2022-07-18 ENCOUNTER — Other Ambulatory Visit: Payer: Self-pay | Admitting: Cardiovascular Disease

## 2022-08-02 ENCOUNTER — Other Ambulatory Visit: Payer: Self-pay | Admitting: *Deleted

## 2022-08-02 MED ORDER — LISINOPRIL 5 MG PO TABS
5.0000 mg | ORAL_TABLET | Freq: Every day | ORAL | 3 refills | Status: DC
  Filled 2023-04-30: qty 90, 90d supply, fill #0

## 2022-08-20 ENCOUNTER — Encounter: Payer: Self-pay | Admitting: Podiatry

## 2022-08-20 ENCOUNTER — Ambulatory Visit: Payer: Medicare HMO | Admitting: Podiatry

## 2022-08-20 VITALS — BP 122/64 | HR 91

## 2022-08-20 DIAGNOSIS — M79675 Pain in left toe(s): Secondary | ICD-10-CM

## 2022-08-20 DIAGNOSIS — B351 Tinea unguium: Secondary | ICD-10-CM | POA: Diagnosis not present

## 2022-08-20 DIAGNOSIS — M79674 Pain in right toe(s): Secondary | ICD-10-CM

## 2022-08-20 DIAGNOSIS — E1149 Type 2 diabetes mellitus with other diabetic neurological complication: Secondary | ICD-10-CM

## 2022-08-25 NOTE — Progress Notes (Signed)
  Subjective:  Patient ID: Jordan Dykes., male    DOB: 11-07-1945,  MRN: [id number]  Jordan Dykes. presents to clinic today for: at risk foot care with history of diabetic neuropathy and painful elongated mycotic toenails 1-5 bilaterally which are tender when wearing enclosed shoe gear. Pain is relieved with periodic professional debridement.  Chief Complaint  Patient presents with   Diabetes    "Check my feet and cut my toenails."  Dr. [name] - 02/22/2023, glucose - last time checked 112 mg/dl    PCP is [name], MD.  Allergies  Allergen Reactions   Codeine     Rash itching   Statins Other (See Comments)    Muscle aches Muscle aches    Review of Systems: Negative except as noted in the HPI.  Objective: No changes noted in today's physical examination. Vitals:   08/20/22 0949  BP: 122/64  Pulse: 91    Jordan M Thaine Southers. is a pleasant 77 y.o. male in NAD. AAO x 3.  Vascular Examination: Capillary refill time <3 seconds b/l LE. Palpable pedal pulses b/l LE. Digital hair diminished b/l. No pedal edema b/l. Skin temperature gradient WNL b/l. No varicosities b/l. Trace edema noted BLE.Marland Kitchen  Dermatological Examination: Pedal skin with normal turgor, texture and tone b/l. No open wounds. No interdigital macerations b/l. Toenails 1-5 b/l thickened, discolored, dystrophic with subungual debris. There is pain on palpation to dorsal aspect of nailplates.  No corns, calluses nor porokeratoses..  Neurological Examination: Protective sensation intact with 10 gram monofilament b/l LE. Vibratory sensation intact b/l LE.   Musculoskeletal Examination: Normal muscle strength 5/5 to all lower extremity muscle groups bilaterally. Hammertoe(s) noted to the 2-5 bilaterally.. No pain, crepitus or joint limitation noted with ROM b/l LE.  Patient ambulates independently without assistive aids.  Assessment/Plan: 1. Pain due to onychomycosis of toenails of both feet   2. Type II diabetes  mellitus with neurological manifestations (HCC)    -Consent given for treatment as described below: -Examined patient. -Continue foot and shoe inspections daily. Monitor blood glucose per PCP/Endocrinologist's recommendations. -Patient to continue soft, supportive shoe gear daily. -Toenails 1-5 b/l were debrided in length and girth with sterile nail nippers and dremel without iatrogenic bleeding.  -Patient/POA to call should there be question/concern in the interim.   Return in about 3 months (around 11/20/2022).  Freddie Breech, DPM

## 2022-10-08 ENCOUNTER — Other Ambulatory Visit: Payer: Self-pay | Admitting: Cardiovascular Disease

## 2022-10-22 ENCOUNTER — Other Ambulatory Visit: Payer: Self-pay | Admitting: Cardiovascular Disease

## 2022-11-04 DIAGNOSIS — C61 Malignant neoplasm of prostate: Secondary | ICD-10-CM | POA: Diagnosis not present

## 2022-11-04 DIAGNOSIS — I2510 Atherosclerotic heart disease of native coronary artery without angina pectoris: Secondary | ICD-10-CM | POA: Diagnosis not present

## 2022-11-04 DIAGNOSIS — I10 Essential (primary) hypertension: Secondary | ICD-10-CM | POA: Diagnosis not present

## 2022-11-04 DIAGNOSIS — E669 Obesity, unspecified: Secondary | ICD-10-CM | POA: Diagnosis not present

## 2022-11-04 DIAGNOSIS — E1169 Type 2 diabetes mellitus with other specified complication: Secondary | ICD-10-CM | POA: Diagnosis not present

## 2022-11-04 DIAGNOSIS — G4733 Obstructive sleep apnea (adult) (pediatric): Secondary | ICD-10-CM | POA: Diagnosis not present

## 2022-11-04 DIAGNOSIS — M150 Primary generalized (osteo)arthritis: Secondary | ICD-10-CM | POA: Diagnosis not present

## 2022-11-04 DIAGNOSIS — Z6839 Body mass index (BMI) 39.0-39.9, adult: Secondary | ICD-10-CM | POA: Diagnosis not present

## 2022-11-13 ENCOUNTER — Telehealth: Payer: Self-pay | Admitting: Physician Assistant

## 2022-11-13 NOTE — Telephone Encounter (Signed)
Inbound call from patient requesting a refill for pantoprazole medication. States he has 2 doses left. Please advise, thank you.

## 2022-11-14 MED ORDER — PANTOPRAZOLE SODIUM 40 MG PO TBEC: 40.0000 mg | DELAYED_RELEASE_TABLET | Freq: Every day | ORAL | 0 refills | Status: DC

## 2022-11-14 NOTE — Telephone Encounter (Signed)
Prescription sent to patient's pharmacy per patient's request.

## 2022-12-02 IMAGING — NM NM BONE WHOLE BODY
4 series · 4 of 4 positions shown · non-contrast
Comparison: Same day CT abdomen pelvis

CLINICAL DATA: Biopsy-proven prostate cancer

EXAM:
NUCLEAR MEDICINE WHOLE BODY BONE SCAN
TECHNIQUE: Whole body anterior and posterior images were obtained approximately
3 hours after intravenous injection of radiopharmaceutical.
RADIOPHARMACEUTICALS:  21 mCi 3echnetium-22m MDP IV

[Series 1: whole body · 2.66mm/px · 1 of 1 slices shown (1 of 2)]
[im 1/1]
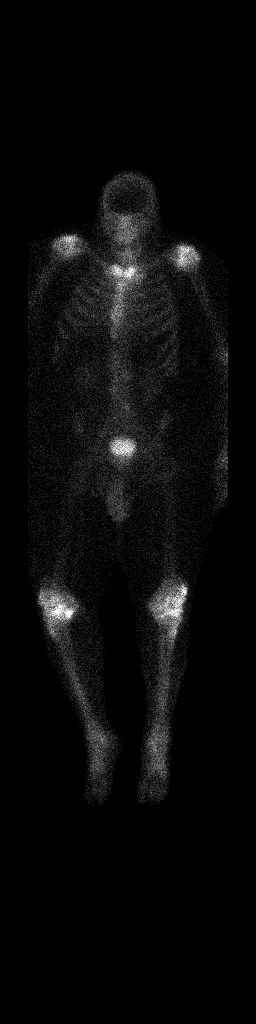

[Series 1: wbr_bone_40 whole body · 2.66mm/px · 1 of 1 slices shown (1 of 2)]
[im 1/1]
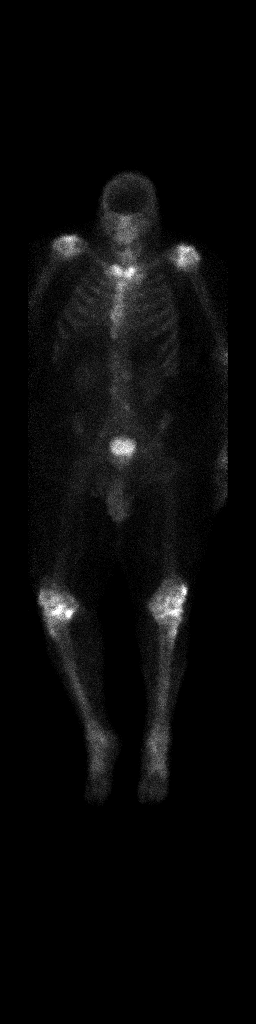

[Series 1: whole body · 2.66mm/px · 1 of 1 slices shown (2 of 2)]
[im 1/1]
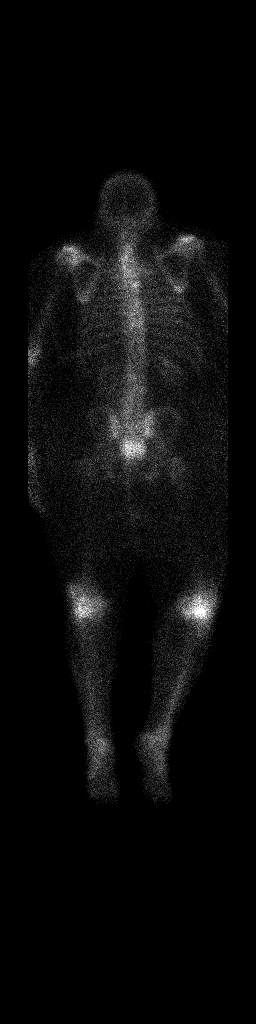

[Series 1: wbr_bone_40 whole body · 2.66mm/px · 1 of 1 slices shown (2 of 2)]
[im 1/1]
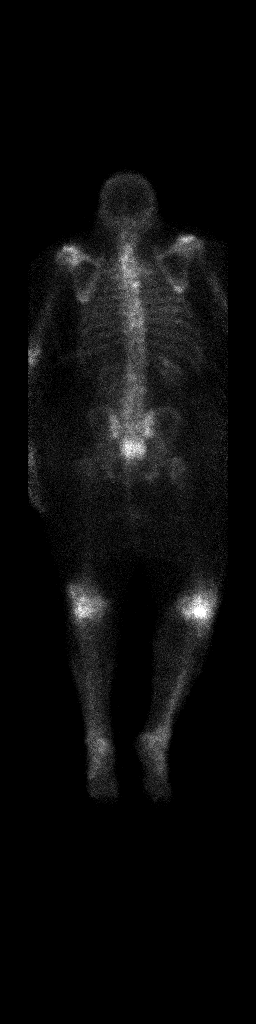

[4 of 4 positions shown; findings below may reference images not displayed]

FINDINGS: No evidence of abnormal radiotracer uptake to suggest osseous
metastatic disease.

Multifocal radiotracer uptake involving the shoulders, spine,
sternoclavicular joints, knees, ankles and feet in a pattern most
consistent with degenerative arthropathy.

Otherwise physiologic distribution of radiotracer activity.
IMPRESSION: No definite scintigraphic evidence of osteoblastic metastatic
disease.

## 2022-12-03 ENCOUNTER — Ambulatory Visit (INDEPENDENT_AMBULATORY_CARE_PROVIDER_SITE_OTHER): Payer: Medicare HMO | Admitting: Podiatry

## 2022-12-03 ENCOUNTER — Encounter: Payer: Self-pay | Admitting: Podiatry

## 2022-12-03 DIAGNOSIS — B351 Tinea unguium: Secondary | ICD-10-CM | POA: Diagnosis not present

## 2022-12-03 DIAGNOSIS — M79674 Pain in right toe(s): Secondary | ICD-10-CM | POA: Diagnosis not present

## 2022-12-03 DIAGNOSIS — E1149 Type 2 diabetes mellitus with other diabetic neurological complication: Secondary | ICD-10-CM

## 2022-12-03 DIAGNOSIS — L84 Corns and callosities: Secondary | ICD-10-CM

## 2022-12-03 DIAGNOSIS — M79675 Pain in left toe(s): Secondary | ICD-10-CM

## 2022-12-08 NOTE — Progress Notes (Signed)
Subjective:  Patient ID: Jordan Dykes., male    DOB: Jul 04, 1945,  MRN: [id number]  77 y.o. male presents at risk foot care with history of diabetic neuropathy and painful thick toenails that are difficult to trim. Pain interferes with ambulation. Aggravating factors include wearing enclosed shoe gear. Pain is relieved with periodic professional debridement. Chief Complaint  Patient presents with   Diabetes    BS- A1C-5.5 PCPV-10/2022    New problem(s): None   PCP is [name], MD.  Allergies  Allergen Reactions   Codeine     Rash itching   Statins Other (See Comments)    Muscle aches Muscle aches    Review of Systems: Negative except as noted in the HPI.   Objective:  Jordan Heckmann. is a pleasant 77 y.o. male morbidly obese in NAD.Marland Kitchen AAO x 3.  Vascular Examination: Vascular status intact b/l with palpable pedal pulses. CFT immediate b/l. Pedal hair present. No edema. No pain with calf compression b/l. Skin temperature gradient WNL b/l. No varicosities noted. No cyanosis or clubbing noted.  Neurological Examination: Sensation grossly intact b/l with 10 gram monofilament. Vibratory sensation intact b/l.  Dermatological Examination: Pedal skin with normal turgor, texture and tone b/l. No open wounds nor interdigital macerations noted. Toenails 1-5 b/l thick, discolored, elongated with subungual debris and pain on dorsal palpation.  Hyperkeratotic lesion(s) distal tip of bilateral 2nd toes.  No erythema, no edema, no drainage, no fluctuance.  Musculoskeletal Examination: Muscle strength 5/5 to b/l LE.  No pain, crepitus noted b/l. Hammertoe(s) noted to the 2-5 bilaterally. No pain, crepitus or joint limitation noted with ROM b/l LE.    Radiographs: None  Assessment:   1. Pain due to onychomycosis of toenails of both feet   2. Corns   3. Type II diabetes mellitus with neurological manifestations (HCC)    Plan:  -Patient was evaluated and treated. All patient's  and/or POA's questions/concerns answered on today's visit. -Continue supportive shoe gear daily. -Mycotic toenails 1-5 bilaterally were debrided in length and girth with sterile nail nippers and dremel without incident. -Corn(s) bilateral 2nd toes pared utilizing sharp debridement with sterile blade without complication or incident. Total number debrided=2. -Patient/POA to call should there be question/concern in the interim.  Return in about 3 months (around 03/05/2023).  Freddie Breech, DPM

## 2022-12-12 ENCOUNTER — Other Ambulatory Visit: Payer: Medicare HMO

## 2022-12-12 DIAGNOSIS — Z8546 Personal history of malignant neoplasm of prostate: Secondary | ICD-10-CM

## 2022-12-13 LAB — PSA: Prostate Specific Ag, Serum: <0.1 ng/mL (ref 0.0–4.0)

## 2022-12-19 ENCOUNTER — Ambulatory Visit: Payer: Medicare HMO | Admitting: Urology

## 2022-12-19 ENCOUNTER — Encounter: Payer: Self-pay | Admitting: Urology

## 2022-12-19 VITALS — BP 117/68 | HR 66 | Ht 64.0 in | Wt 267.0 lb

## 2022-12-19 DIAGNOSIS — N403 Nodular prostate with lower urinary tract symptoms: Secondary | ICD-10-CM | POA: Diagnosis not present

## 2022-12-19 DIAGNOSIS — R3915 Urgency of urination: Secondary | ICD-10-CM

## 2022-12-19 DIAGNOSIS — R351 Nocturia: Secondary | ICD-10-CM | POA: Diagnosis not present

## 2022-12-19 DIAGNOSIS — Z8546 Personal history of malignant neoplasm of prostate: Secondary | ICD-10-CM | POA: Diagnosis not present

## 2022-12-19 LAB — URINALYSIS, ROUTINE W REFLEX MICROSCOPIC
Bilirubin, UA: NEGATIVE
Glucose, UA: NEGATIVE
Ketones, UA: NEGATIVE
Leukocytes,UA: NEGATIVE
Nitrite, UA: NEGATIVE
Protein,UA: NEGATIVE
RBC, UA: NEGATIVE
Specific Gravity, UA: 1.025 (ref 1.005–1.030)
Urobilinogen, Ur: 1 mg/dL (ref 0.2–1.0)
pH, UA: 5.5 (ref 5.0–7.5)

## 2022-12-19 MED ORDER — TAMSULOSIN HCL 0.4 MG PO CAPS
0.4000 mg | ORAL_CAPSULE | Freq: Every day | ORAL | 3 refills | Status: DC
  Filled 2023-03-02: qty 90, 90d supply, fill #0
  Filled 2023-06-24: qty 90, 90d supply, fill #1

## 2022-12-19 NOTE — Progress Notes (Signed)
Subjective: 1. History of prostate cancer   2. 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 Urgency of urination   4. Nocturia    12/19/22: Jordan [name] returns today in f/u.  His PSA remains <0.1.  He is 2 years out from radiation therapy and about 1 year out from ADT.  He remains on tamsulosin which is due for a refill.   He is voiding well with an IPSS of 7.  He has noct x 2 and some urgency.  He has no GI complaints on metamucil.  He has lost 22 lb on Mounjaro which he is on to try to get his weight down for a left knee replacement.  He has no bone pain.     06/13/22: Jordan [name] returns today in f/u for his history below.  He has completed his course of adjuvant ADT in 11/23 and completed radiation in 9/22.  His T is up to 384 and the PSA remains <0.1.  He has fatigue. He has arthralgias but no bone pain or involuntary weight loss.  His IPSS is 9 with nocturia x 2.  He remains on tamsulosin.  His UA is clear.   12/20/21: Jordan [name] returns in f/u for history below.  He remains on ADT with firmagon and will get his last shot in November. His PSA remains undetectible and the testosterone is 23.  He has frequency q1.5-2hrs and nocturia x 3 but he is otherwise voiding well on tamsulosin with an IPSS of 14. He had a very small amount of blood in the urine about a month ago but none since. He had a hematuria w/u at the first of the year.  His weight is stable.  He has chronic arthralgias.  He has no GI complaints.  He continues to have hot flashes and altered taste along with reduced energy with the ADT.   4/13//23: Jordan [name] returns today in f/u for the history below.  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  He will continue the ADT until 11/23.  His PSA <0.1 and his T is 17.  He has hot flashes but no other complaints.  His weight is stable.   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  His IPSS is 7 with nocturia x 2.  He  has some urgency.  His UA is clear.   03/08/21: Jordan [name] returns today in f/u.  He got his last firmagon on 02/01/21 and is due today and will continue it through the end of the year.  His PSA is down further to 0.2.  He remains on tamsulosin and if voiding well but he has nocturia 2-3x.  His IPSS is 11.  He has had no further gross hematuria.  He had a CT hematuria study on 02/12/21 that showed some small stones but no other concerning findings.  He is for cystoscopy today.   He has no GI complaints but is on a stool softener.  He has no weight loss or bone pain.  He has occasional hot flashes 2-3x daily.    11/30/20: Jordan [name] returns today for the history noted below.  His PSA is 1.3.  He completed EXRT on 11/07/20.  He remains on firmagon and is due for an injection today.  He has worsening LUTS with an IPSS of 17.  He was started on tamsulosin by Dr. Kathrynn Running but he still has nocturia x 3 and  urgency.  He has no GI complaints but is on metamucil and is using some miralax to help reduce the need to strain.  He has no bone pain or weight loss but has neuropathy and arthritis.  He has a few hot flashes.   08/03/20: Jordan [name] returns today in f/u.  He is a month out from firmagon 240mg  and his PSA is down to 1.2 with a T of 22.  He is scheduled for a seed implant with [employer] on 09/08/20.  He is tolerating the injection well  with just mild hot flashes. He is voiding well with an IPSS of 2.  His UA is unremarkable.   06/29/20:  Jordan [name] returns today to discuss LT-ADT as a component of treatment of his Gleason 8 high volume prostate cancer.   I have reviewed the risks and side effects ADT in detail and will initiate firmagon today and continue that monthly.    06/08/20: Jordan [name] returns today in f/u to discuss the results of his prostate biopsy and staging studies.  He was found to have a 35.66ml prostate with 6/6 right cores positive for cancer.  5/6 were GG4 with up to 90% involvement and 1/6 was GG3.  He has high risk  disease and has undergone staging with bone scan and CT with both being negative.  He has stage T2b N0 M0 cancer.  He has DM, CAD and HTN as well as morbid obesity but swims regularly and is compliant with his meds.  He should have a reasonable 5 year life expectancy.   Hx: He  is a 77 yo male who is sent by Dr. Loleta Chance for an increase in his PSA from 1.7 on 02/02/18 to 3.58 on 02/15/20.  He is voiding well with nocturia 1-2x.  His IPSS is 1. He has a good stream but it sprays a bit.  He has ED but is no longer responding to sildenafil.  He has had no UTI's or GU surgery.  His father had some prostate issues, possibly cancer, but he had other medical issues so he was not felt to be a biopsy candidate.    IPSS     Row Name 12/19/22 1100         International Prostate Symptom Score   How often have you had the sensation of not emptying your bladder? Less than 1 in 5     How often have you had to urinate less than every two hours? Less than 1 in 5 times     How often have you found you stopped and started again several times when you urinated? Less than 1 in 5 times     How often have you found it difficult to postpone urination? Less than half the time     How often have you had a weak urinary stream? Not at All     How often have you had to strain to start urination? Not at All     How many times did you typically get up at night to urinate? 2 Times     Total IPSS Score 7       Quality of Life due to urinary symptoms   If you were to spend the rest of your life with your urinary condition just the way it is now how would you feel about that? Mostly Disatisfied                  ROS:  Review of Systems  Constitutional:  Positive for malaise/fatigue.  HENT:  Positive for congestion.   Musculoskeletal:  Positive for joint pain.  Skin:  Positive for itching.  Neurological:  Positive for weakness.  Psychiatric/Behavioral:  Positive for memory loss.     Allergies  Allergen Reactions    Codeine     Rash itching   Statins Other (See Comments)    Muscle aches Muscle aches    Past Medical History:  Diagnosis Date   Arthritis    all over oa   CAD (coronary artery disease)    Cancer (HCC)    Prostate   Colon polyps 01/23/2010   Descending   Diverticulosis    dm type 2    Eczema 09/05/2020   Gastric ulcer 2010   GI bleed 2010   Hyperlipidemia    Hypertension    Hypotension    transient   Neuropathy 09/05/2020   both hands and feet   OSA (obstructive sleep apnea)    Severe, on CPAP therapy   Prostate cancer (HCC)    Wears glasses 09/05/2020    Past Surgical History:  Procedure Laterality Date   CARDIAC CATHETERIZATION  05/01/2007   Recommend rotational atherectomy followed by PTCA and probable stenting of LAD.   CARDIAC CATHETERIZATION  05/28/2007   Mid LAD hihgh grade 80-90% stenosis, stented with a 3x109mm Cypher stent implanted at 16atm, postdilated with a 3.25x9mm Quantum at 16atm, which revealed excellent wall appoistion - 5.18mm   CARDIOVASCULAR STRESS TEST  02/05/2011   Perfusion defect seen in inferior myocardial region consistent with diagphragmatic attenuation. Remaining myocardium demonstrates normal myocardial perfusion with no evidence of ischemia or infarct. ECG is positive for ischemia.   colonscopy  2012   CPAP/BIPAP SLEEP STUDY  07/01/2007   AHI-0.5/hr at 11cm water pressure, RDI-12.6/hr   CYSTOSCOPY  09/08/2020   Procedure: CYSTOSCOPY FLEXIBLE;  Surgeon: Bjorn Pippin, MD;  Location: [facility];  Service: Urology;;  NO SEEDS FOUND IN BLADDER   LOWER ARTERIAL DOPPLER  06/03/2007   No evidence of dissection, AV fistula, or active pseudoaneurysm.   NASAL ENDOSCOPY  12/2008   RADIOACTIVE SEED IMPLANT N/A 09/08/2020   Procedure: RADIOACTIVE SEED IMPLANT/BRACHYTHERAPY IMPLANT;  Surgeon: Bjorn Pippin, MD;  Location: [facility];  Service: Urology;  Laterality: N/A;  51 SEEDS IMPLANTED   SLEEP STUDY  05/13/2007    AHI-11.52/hr, AHI REM-56.0/hr, RDI-11.7/hr, RDI REM-56.0/hr, avg oxygen sat-94%   SPACE OAR INSTILLATION N/A 09/08/2020   Procedure: SPACE OAR INSTILLATION;  Surgeon: Bjorn Pippin, MD;  Location: [facility];  Service: Urology;  Laterality: N/A;   stent to heart x 1  2010   TRANSTHORACIC ECHOCARDIOGRAM  03/13/2010   EF >55%, normal diastolic function, mild MR, mild TR, and normal RVSP    Social History   Socioeconomic History   Marital status: Married    Spouse name: Not on file   Number of children: 2   Years of education: Not on file   Highest education level: Not on file  Occupational History   Occupation: retired  Tobacco Use   Smoking status: Former    Current packs/day: 0.00    Average packs/day: 0.5 packs/day for 16.0 years (8.0 ttl pk-yrs)    Types: Cigarettes    Start date: 01/28/1962    Quit date: 01/28/1978    Years since quitting: 44.9   Smokeless tobacco: Never  Vaping Use   Vaping status: Never Used  Substance and Sexual Activity   Alcohol use: Not Currently  Drug use: No   Sexual activity: Yes  Other Topics Concern   Not on file  Social History Narrative   Not on file   Social Determinants of Health   Financial Resource Strain: Low Risk  (02/12/2021)   Overall Financial Resource Strain (CARDIA)    Difficulty of Paying Living Expenses: Not hard at all  Food Insecurity: No Food Insecurity (02/12/2021)   Hunger Vital Sign    Worried About Running Out of Food in the Last Year: Never true    Ran Out of Food in the Last Year: Never true  Transportation Needs: No Transportation Needs (02/12/2021)   PRAPARE - Administrator, Civil Service (Medical): No    Lack of Transportation (Non-Medical): No  Physical Activity: Inactive (02/12/2021)   Exercise Vital Sign    Days of Exercise per Week: 0 days    Minutes of Exercise per Session: 0 min  Stress: No Stress Concern Present (02/12/2021)   [employer] of Occupational Health -  Occupational Stress Questionnaire    Feeling of Stress : Not at all  Social Connections: Moderately Integrated (02/12/2021)   Social Connection and Isolation Panel [NHANES]    Frequency of Communication with Friends and Family: More than three times a week    Frequency of Social Gatherings with Friends and Family: Three times a week    Attends Religious Services: 1 to 4 times per year    Active Member of Clubs or Organizations: No    Attends Banker Meetings: Never    Marital Status: Married  Catering manager Violence: Not At Risk (02/12/2021)   Humiliation, Afraid, Rape, and Kick questionnaire    Fear of Current or Ex-Partner: No    Emotionally Abused: No    Physically Abused: No    Sexually Abused: No    Family History  Problem Relation Age of Onset   Stroke Brother    Hypertension Brother    Alzheimer's disease Maternal Grandmother    Stroke Maternal Grandfather    Heart disease Maternal Grandfather    Stroke Paternal Grandmother    Heart attack Paternal Grandfather    Hypertension Father    Alzheimer's disease Father    Parkinson's disease Father    Alzheimer's disease Mother    Pancreatic cancer Maternal Uncle    Breast cancer Neg Hx    Prostate cancer Neg Hx    Colon cancer Neg Hx     Anti-infectives: Anti-infectives (From admission, onward)    None       Current Outpatient Medications  Medication Sig Dispense Refill   ACCU-CHEK GUIDE test strip TEST AS DIRECTED ONCEODAILY.M     acetaminophen (TYLENOL) 650 MG CR tablet Take 650 mg by mouth every 8 (eight) hours. Pt takes 2 tablets in the morning and 2 tablets in the evening     Apoaequorin (PREVAGEN PO) Take 20 mg by mouth daily.     aspirin 81 MG tablet Take 81 mg by mouth daily.     calcium carbonate (OSCAL) 1500 (600 Ca) MG TABS tablet Take 1,500 mg by mouth 2 (two) times daily with a meal.     carvedilol (COREG) 3.125 MG tablet Take 1 tablet (3.125 mg total) by mouth 2 (two) times daily with  a meal. 90 tablet 3   Cholecalciferol (VITAMIN D3) 25 MCG (1000 UT) CAPS Take 1 capsule by mouth 2 (two) times daily.     CINNAMON PO Take 2 tablets by mouth 2 (two) times daily. 1000  mg tablet     Degarelix Acetate (FIRMAGON [place]) Inject into the skin every 30 (thirty) days.     ezetimibe (ZETIA) 10 MG tablet TAKE ONE TABLET BY MOUTH ONCE DAILY. 90 tablet 2   ketoconazole (NIZORAL) 2 % cream Apply 1 application topically daily.     lisinopril (ZESTRIL) 5 MG tablet Take 1 tablet (5 mg total) by mouth daily. 90 tablet 3   metFORMIN (GLUCOPHAGE) 500 MG tablet 500 mg daily with breakfast.     MOUNJARO 2.5 MG/0.5ML Pen SMARTSIG:2.5 Milligram(s) SUB-Q Once a Week     mupirocin ointment (BACTROBAN) 2 % Apply 1 Application topically 2 (two) times daily. 30 g 2   pantoprazole (PROTONIX) 40 MG tablet Take 1 tablet (40 mg total) by mouth daily. 90 tablet 0   psyllium (METAMUCIL) 58.6 % packet Take by mouth daily. 1 Tablespoon daily     simvastatin (ZOCOR) 40 MG tablet TAKE (1) TABLET BY MOUTH ONCE AT BEDTIME. 90 tablet 0   tirzepatide (MOUNJARO) 5 MG/0.5ML Pen Inject 5 mg into the skin once a week.     tamsulosin (FLOMAX) 0.4 MG CAPS capsule Take 1 capsule (0.4 mg total) by mouth daily after supper. 90 capsule 3   No current facility-administered medications for this visit.     Objective: Vital signs in last 24 hours: BP 117/68   Pulse 66   Ht 5\' 4"  (1.626 m)   Wt 267 lb (121.1 kg)   BMI 45.83 kg/m   Intake/Output from previous day: No intake/output data recorded. Intake/Output this shift: @IOTHISSHIFT @   Physical Exam Vitals reviewed.  Constitutional:      Appearance: Normal appearance.  Neurological:     Mental Status: He is alert.      Recent Results (from the past 2160 hour(s))  PSA     Status: None   Collection Time: 12/12/22  9:30 AM  Result Value Ref Range   Prostate Specific Ag, Serum <0.1 0.0 - 4.0 ng/mL    Comment: Roche ECLIA methodology. According to the American  Urological Association, Serum PSA should decrease and remain at undetectable levels after radical prostatectomy. The AUA defines biochemical recurrence as an initial PSA value 0.2 ng/mL or greater followed by a subsequent confirmatory PSA value 0.2 ng/mL or greater. Values obtained with different assay methods or kits cannot be used interchangeably. Results cannot be interpreted as absolute evidence of the presence or absence of malignant disease.   Urinalysis, Routine w reflex microscopic     Status: None   Collection Time: 12/19/22 11:51 AM  Result Value Ref Range   Specific Gravity, UA 1.025 1.005 - 1.030   pH, UA 5.5 5.0 - 7.5   Color, UA Yellow Yellow   Appearance Ur Clear Clear   Leukocytes,UA Negative Negative   Protein,UA Negative Negative/Trace   Glucose, UA Negative Negative   Ketones, UA Negative Negative   RBC, UA Negative Negative   Bilirubin, UA Negative Negative   Urobilinogen, Ur 1.0 0.2 - 1.0 mg/dL   Nitrite, UA Negative Negative   Microscopic Examination Comment     Comment: Microscopic not indicated and not performed.    UA is clear.  Studies/Results:  Prior notes reviewed.   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   His PSA remains undetectible. F/u in 6 months.   BPH with BOO and OAB.   He is doing well on tamsulosin.  Gross hematuria.    He has had no further bleeding.  Meds ordered this encounter  Medications   tamsulosin (FLOMAX) 0.4 MG CAPS capsule    Sig: Take 1 capsule (0.4 mg total) by mouth daily after supper.    Dispense:  90 capsule    Refill:  3     Orders Placed This Encounter  Procedures   Urinalysis, Routine w reflex microscopic   PSA    Standing Status:   Future    Standing Expiration Date:   12/19/2023     Return in about 6 months (around 06/19/2023) for with PSA. Marland Kitchen    CC: Dr. [name], Dr. Margaretmary Bayley  and Dr. Nicki Guadalajara.    Bjorn Pippin 12/20/[phone] ID: Leretha Dykes., male   DOB: 04-05-1945, 77 y.o.   MRN: [id number]

## 2023-02-03 ENCOUNTER — Other Ambulatory Visit: Payer: Self-pay | Admitting: Cardiovascular Disease

## 2023-02-04 ENCOUNTER — Telehealth: Payer: Self-pay | Admitting: Cardiovascular Disease

## 2023-02-04 MED ORDER — SIMVASTATIN 40 MG PO TABS
40.0000 mg | ORAL_TABLET | Freq: Every day | ORAL | 1 refills | Status: DC
  Filled 2023-04-30: qty 90, 90d supply, fill #0

## 2023-02-04 NOTE — Telephone Encounter (Signed)
*  STAT* If patient is at the pharmacy, call can be transferred to refill team.   1. Which medications need to be refilled? (please list name of each medication and dose if known)   simvastatin (ZOCOR) 40 MG tablet  TAKE (1) TABLET BY MOUTH ONCE AT BEDTIME.    2. Would you like to learn more about the convenience, safety, & potential cost savings by using the [facility]? No   3. Are you open to using the [facility] No   4. Which pharmacy/location (including street and city if local pharmacy) is medication to be sent to?[facility] - [place], [place] - [address]    5. Do they need a 30 day or 90 day supply? 90 Day Supply  Pt is currently out of the medication.

## 2023-02-10 ENCOUNTER — Telehealth: Payer: Self-pay | Admitting: Physician Assistant

## 2023-02-10 MED ORDER — PANTOPRAZOLE SODIUM 40 MG PO TBEC
40.0000 mg | DELAYED_RELEASE_TABLET | Freq: Every day | ORAL | 3 refills | Status: AC
  Filled 2023-04-30: qty 90, 90d supply, fill #0
  Filled 2023-08-06: qty 90, 90d supply, fill #1

## 2023-02-10 NOTE — Telephone Encounter (Signed)
Refill sent into pharmacy. Patient is due for a follow up in Sept 2025

## 2023-02-10 NOTE — Telephone Encounter (Signed)
PT is calling to get a refill on pantoprazole. Please advise.

## 2023-02-28 ENCOUNTER — Other Ambulatory Visit: Payer: Self-pay | Admitting: Cardiovascular Disease

## 2023-02-28 ENCOUNTER — Other Ambulatory Visit (HOSPITAL_COMMUNITY): Payer: Self-pay

## 2023-02-28 ENCOUNTER — Other Ambulatory Visit: Payer: Self-pay

## 2023-02-28 MED ORDER — CARVEDILOL 3.125 MG PO TABS
3.1250 mg | ORAL_TABLET | Freq: Two times a day (BID) | ORAL | 0 refills | Status: DC
  Filled 2023-02-28: qty 90, 45d supply, fill #0

## 2023-03-01 ENCOUNTER — Other Ambulatory Visit (HOSPITAL_COMMUNITY): Payer: Self-pay

## 2023-03-01 MED ORDER — GLUCOSE BLOOD VI STRP: ORAL_STRIP | Freq: Two times a day (BID) | 2 refills | Status: AC

## 2023-03-01 MED ORDER — TRUEPLUS LANCETS 33G MISC: Freq: Two times a day (BID) | 2 refills | Status: AC

## 2023-03-01 MED ORDER — METFORMIN HCL 500 MG PO TABS: 500.0000 mg | ORAL_TABLET | Freq: Every morning | ORAL | 3 refills | Status: DC

## 2023-03-02 ENCOUNTER — Other Ambulatory Visit (HOSPITAL_COMMUNITY): Payer: Self-pay

## 2023-03-02 MED ORDER — METFORMIN HCL 500 MG PO TABS
500.0000 mg | ORAL_TABLET | Freq: Every morning | ORAL | 1 refills | Status: DC
  Filled 2023-03-02: qty 90, 90d supply, fill #0

## 2023-03-03 ENCOUNTER — Other Ambulatory Visit: Payer: Self-pay

## 2023-03-04 ENCOUNTER — Other Ambulatory Visit (HOSPITAL_COMMUNITY): Payer: Self-pay

## 2023-03-05 DIAGNOSIS — E119 Type 2 diabetes mellitus without complications: Secondary | ICD-10-CM | POA: Diagnosis not present

## 2023-03-10 DIAGNOSIS — E6609 Other obesity due to excess calories: Secondary | ICD-10-CM | POA: Diagnosis not present

## 2023-03-10 DIAGNOSIS — I2510 Atherosclerotic heart disease of native coronary artery without angina pectoris: Secondary | ICD-10-CM | POA: Diagnosis not present

## 2023-03-10 DIAGNOSIS — C61 Malignant neoplasm of prostate: Secondary | ICD-10-CM | POA: Diagnosis not present

## 2023-03-10 DIAGNOSIS — M150 Primary generalized (osteo)arthritis: Secondary | ICD-10-CM | POA: Diagnosis not present

## 2023-03-10 DIAGNOSIS — Z6836 Body mass index (BMI) 36.0-36.9, adult: Secondary | ICD-10-CM | POA: Diagnosis not present

## 2023-03-10 DIAGNOSIS — E1169 Type 2 diabetes mellitus with other specified complication: Secondary | ICD-10-CM | POA: Diagnosis not present

## 2023-03-10 DIAGNOSIS — G4733 Obstructive sleep apnea (adult) (pediatric): Secondary | ICD-10-CM | POA: Diagnosis not present

## 2023-03-10 DIAGNOSIS — I10 Essential (primary) hypertension: Secondary | ICD-10-CM | POA: Diagnosis not present

## 2023-03-10 DIAGNOSIS — I119 Hypertensive heart disease without heart failure: Secondary | ICD-10-CM | POA: Diagnosis not present

## 2023-03-12 ENCOUNTER — Encounter: Payer: Self-pay | Admitting: Podiatry

## 2023-03-12 ENCOUNTER — Ambulatory Visit (INDEPENDENT_AMBULATORY_CARE_PROVIDER_SITE_OTHER): Payer: HMO | Admitting: Podiatry

## 2023-03-12 VITALS — Ht 64.0 in | Wt 267.0 lb

## 2023-03-12 DIAGNOSIS — M79675 Pain in left toe(s): Secondary | ICD-10-CM

## 2023-03-12 DIAGNOSIS — M79674 Pain in right toe(s): Secondary | ICD-10-CM

## 2023-03-12 DIAGNOSIS — B351 Tinea unguium: Secondary | ICD-10-CM | POA: Diagnosis not present

## 2023-03-12 DIAGNOSIS — E1149 Type 2 diabetes mellitus with other diabetic neurological complication: Secondary | ICD-10-CM

## 2023-03-13 ENCOUNTER — Other Ambulatory Visit: Payer: Self-pay

## 2023-03-13 ENCOUNTER — Other Ambulatory Visit (HOSPITAL_COMMUNITY): Payer: Self-pay

## 2023-03-13 MED ORDER — MOUNJARO 5 MG/0.5ML ~~LOC~~ SOAJ
5.0000 mg | SUBCUTANEOUS | 1 refills | Status: DC
  Filled 2023-03-13: qty 2, 28d supply, fill #0

## 2023-03-13 MED ORDER — MOUNJARO 5 MG/0.5ML ~~LOC~~ SOAJ
5.0000 mg | SUBCUTANEOUS | 1 refills | Status: AC
  Filled 2023-04-07: qty 2, 28d supply, fill #0
  Filled 2023-05-05 – 2023-05-07 (×2): qty 2, 28d supply, fill #1

## 2023-03-14 ENCOUNTER — Other Ambulatory Visit (HOSPITAL_COMMUNITY): Payer: Self-pay

## 2023-03-19 NOTE — Progress Notes (Signed)
  Subjective:  Patient ID: Jordan Dykes., male    DOB: 1945-03-22,  MRN: [id number]  78 y.o. male presents at risk foot care with history of diabetic neuropathy and painful, discolored, thick toenails which interfere with daily activities.  Chief Complaint  Patient presents with   DFc    He is here for a nail trim and diabetic foot care, PCP is Dr. Loleta Chance, "he was told his last A1c was good but he didn't  remember the number"   New problem(s): None   PCP is [name], MD , and last visit was November 04, 2022.  Allergies  Allergen Reactions   Codeine     Rash itching   Statins Other (See Comments)    Muscle aches Muscle aches    Review of Systems: Negative except as noted in the HPI.   Objective:  Jordan Cammisa. is a pleasant 78 y.o. male morbidly obese in NAD. AAO x 3.  Vascular Examination: Vascular status intact b/l with palpable pedal pulses. CFT immediate b/l. Pedal hair present. No edema. No pain with calf compression b/l. Skin temperature gradient WNL b/l. No varicosities noted. No cyanosis or clubbing noted.  Neurological Examination: Sensation grossly intact b/l with 10 gram monofilament. Vibratory sensation intact b/l.  Dermatological Examination: Pedal skin with normal turgor, texture and tone b/l. No open wounds nor interdigital macerations noted. Toenails 1-5 b/l thick, discolored, elongated with subungual debris and pain on dorsal palpation. No hyperkeratotic nor porokeratotic lesions present on today's visit.  Musculoskeletal Examination: Muscle strength 5/5 to b/l LE.  No pain, crepitus noted b/l. Hammertoe(s) noted to the 2-5 bilaterally. No pain, crepitus or joint limitation noted with ROM b/l LE.    Radiographs: None   Assessment:   1. Pain due to onychomycosis of toenails of both feet   2. Type II diabetes mellitus with neurological manifestations [facility])    Plan:  Patient was evaluated and treated. All patient's and/or POA's questions/concerns  addressed on today's visit. Toenails 1-5 debrided in length and girth without incident. Continue soft, supportive shoe gear daily. Report any pedal injuries to medical professional. Call office if there are any questions/concerns. -Patient/POA to call should there be question/concern in the interim.  Return in about 3 months (around 06/10/2023).  Freddie Breech, DPM      [facility] LOCATION: [address] [place] [address]                   Office [phone]   [facility] LOCATION: [address] [place], [place] [address] Office [phone]

## 2023-04-07 ENCOUNTER — Other Ambulatory Visit: Payer: Self-pay

## 2023-04-07 MED FILL — Ezetimibe Tab 10 MG: ORAL | 90 days supply | Qty: 90 | Fill #0 | Status: AC

## 2023-04-10 ENCOUNTER — Other Ambulatory Visit (HOSPITAL_BASED_OUTPATIENT_CLINIC_OR_DEPARTMENT_OTHER): Payer: Self-pay

## 2023-04-30 ENCOUNTER — Other Ambulatory Visit: Payer: Self-pay

## 2023-04-30 ENCOUNTER — Other Ambulatory Visit (HOSPITAL_COMMUNITY): Payer: Self-pay

## 2023-04-30 ENCOUNTER — Other Ambulatory Visit: Payer: Self-pay | Admitting: Cardiovascular Disease

## 2023-05-02 ENCOUNTER — Other Ambulatory Visit (HOSPITAL_COMMUNITY): Payer: Self-pay

## 2023-05-02 ENCOUNTER — Other Ambulatory Visit: Payer: Self-pay

## 2023-05-02 MED ORDER — EZETIMIBE 10 MG PO TABS
10.0000 mg | ORAL_TABLET | Freq: Every day | ORAL | 0 refills | Status: DC
  Filled 2023-05-02: qty 90, fill #0
  Filled 2023-07-04: qty 90, 90d supply, fill #0

## 2023-05-02 MED ORDER — CARVEDILOL 3.125 MG PO TABS
3.1250 mg | ORAL_TABLET | Freq: Two times a day (BID) | ORAL | 0 refills | Status: DC
  Filled 2023-05-02 (×2): qty 90, 45d supply, fill #0

## 2023-05-05 ENCOUNTER — Other Ambulatory Visit (HOSPITAL_COMMUNITY): Payer: Self-pay

## 2023-05-07 ENCOUNTER — Other Ambulatory Visit (HOSPITAL_COMMUNITY): Payer: Self-pay

## 2023-05-07 MED ORDER — MOUNJARO 7.5 MG/0.5ML ~~LOC~~ SOAJ
7.5000 mg | SUBCUTANEOUS | 1 refills | Status: AC
Start: 2023-05-07 — End: ?
  Filled 2023-05-07 – 2023-06-05 (×2): qty 2, 28d supply, fill #0
  Filled 2023-07-04: qty 2, 28d supply, fill #1
  Filled 2023-08-05: qty 2, 28d supply, fill #2

## 2023-05-15 DIAGNOSIS — M1712 Unilateral primary osteoarthritis, left knee: Secondary | ICD-10-CM | POA: Diagnosis not present

## 2023-05-15 DIAGNOSIS — M1711 Unilateral primary osteoarthritis, right knee: Secondary | ICD-10-CM | POA: Diagnosis not present

## 2023-05-15 DIAGNOSIS — M170 Bilateral primary osteoarthritis of knee: Secondary | ICD-10-CM | POA: Diagnosis not present

## 2023-06-05 ENCOUNTER — Other Ambulatory Visit (HOSPITAL_COMMUNITY): Payer: Self-pay

## 2023-06-12 ENCOUNTER — Telehealth: Payer: Self-pay | Admitting: Cardiovascular Disease

## 2023-06-12 ENCOUNTER — Other Ambulatory Visit: Payer: Medicare HMO

## 2023-06-12 DIAGNOSIS — Z8546 Personal history of malignant neoplasm of prostate: Secondary | ICD-10-CM | POA: Diagnosis not present

## 2023-06-12 NOTE — Telephone Encounter (Signed)
   Name: Piero Mustard.  DOB: 05/27/1945  MRN: [id number]  Primary Cardiologist: Magnus Schuller, MD  Chart reviewed as part of pre-operative protocol coverage. The patient has an upcoming visit scheduled with Dr. Loetta Ringer on 07/03/2023 at which time clearance can be addressed in case there are any issues that would impact surgical recommendations.   I added preop FYI to appointment note so that provider is aware to address at time of outpatient visit.  Per office protocol the cardiology provider should forward their finalized clearance decision and recommendations regarding antiplatelet therapy to the requesting party below.    I will route this message as FYI to requesting party and remove this message from the preop box as separate preop APP input not needed at this time.   Please call with any questions.  Francene Ing, Retha Cast, NP  06/12/2023, 10:51 AM

## 2023-06-12 NOTE — Telephone Encounter (Signed)
   Pre-operative Risk Assessment    Patient Name: Jordan [name].  DOB: 04-18-1945 MRN: [id number]      Request for Surgical Clearance    Procedure:   Left TK Replacement  Date of Surgery:  Clearance TBD                                 Surgeon:  Dr Neil Balls Surgeon's Group or Practice Name:  [facility] Phone number:  [phone] Fax number:  [phone]   Type of Clearance Requested:   - Medical  - Pharmacy:  Hold    unclear on medication   Type of Anesthesia:  Spinal   Additional requests/questions:   None  Signed, April L Harrington   06/12/2023, 10:45 AM

## 2023-06-13 LAB — PSA: Prostate Specific Ag, Serum: 0.1 ng/mL (ref 0.0–4.0)

## 2023-06-24 ENCOUNTER — Other Ambulatory Visit (HOSPITAL_COMMUNITY): Payer: Self-pay

## 2023-06-26 ENCOUNTER — Ambulatory Visit: Payer: Medicare HMO | Admitting: Urology

## 2023-06-26 ENCOUNTER — Other Ambulatory Visit (HOSPITAL_COMMUNITY): Payer: Self-pay

## 2023-06-26 ENCOUNTER — Encounter: Payer: Self-pay | Admitting: Urology

## 2023-06-26 VITALS — BP 132/75 | HR 73

## 2023-06-26 DIAGNOSIS — N403 Nodular prostate with lower urinary tract symptoms: Secondary | ICD-10-CM

## 2023-06-26 DIAGNOSIS — R3915 Urgency of urination: Secondary | ICD-10-CM | POA: Diagnosis not present

## 2023-06-26 DIAGNOSIS — R351 Nocturia: Secondary | ICD-10-CM

## 2023-06-26 DIAGNOSIS — Z8546 Personal history of malignant neoplasm of prostate: Secondary | ICD-10-CM | POA: Diagnosis not present

## 2023-06-26 LAB — URINALYSIS, ROUTINE W REFLEX MICROSCOPIC
Bilirubin, UA: NEGATIVE
Glucose, UA: NEGATIVE
Ketones, UA: NEGATIVE
Leukocytes,UA: NEGATIVE
Nitrite, UA: NEGATIVE
Protein,UA: NEGATIVE
RBC, UA: NEGATIVE
Specific Gravity, UA: 1.02 (ref 1.005–1.030)
Urobilinogen, Ur: 0.2 mg/dL (ref 0.2–1.0)
pH, UA: 6 (ref 5.0–7.5)

## 2023-06-26 MED ORDER — TAMSULOSIN HCL 0.4 MG PO CAPS
0.4000 mg | ORAL_CAPSULE | Freq: Every day | ORAL | 3 refills | Status: DC
  Filled 2023-06-26 – 2024-01-01 (×3): qty 90, 90d supply, fill #0

## 2023-06-26 NOTE — Progress Notes (Signed)
 Jordan [name]

## 2023-06-26 NOTE — Progress Notes (Signed)
 Subjective: 1. History of prostate cancer   2. 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 Urgency of urination   4. 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 His PSA is 0.1.  It was <0.1 previously.  He remains on tamsulosin  with an iPSS of 12 with nocturia 2-3x and some intermittency.  HE has had no hematuria and his UA is clear today.  He has no GI complaints.  He has lost about 30lb with Mounjaro .   He is looking into getting the left knee replaced.  He has no bone pain.    12/19/22: Jordan [name] returns today in f/u.  His PSA remains <0.1.  He is 2 years out from radiation therapy and about 1 year out from ADT.  He remains on tamsulosin  which is due for a refill.   He is voiding well with an IPSS of 7.  He has noct x 2 and some urgency.  He has no GI complaints on metamucil.  He has lost 22 lb on Mounjaro  which he is on to try to get his weight down for a left knee replacement.  He has no bone pain.     06/13/22: Jordan [name] returns today in f/u for his history below.  He has completed his course of adjuvant ADT in 11/23 and completed radiation in 9/22.  His T is up to 384 and the PSA remains <0.1.  He has fatigue. He has arthralgias but no bone pain or involuntary weight loss.  His IPSS is 9 with nocturia x 2.  He remains on tamsulosin .  His UA is clear.   12/20/21: Jordan [name] returns in f/u for history below.  He remains on ADT with firmagon  and will get his last shot in November. His PSA remains undetectible and the testosterone  is 23.  He has frequency q1.5-2hrs and nocturia x 3 but he is otherwise voiding well on tamsulosin  with an IPSS of 14. He had a very small amount of blood in the urine about a month ago but none since. He had a hematuria w/u at the first of the year.  His weight is stable.  He has chronic arthralgias.  He has no GI complaints.  He continues to have hot flashes and altered taste along with  reduced energy with the ADT.   4/13//23: Jordan [name] returns today in f/u for the history below.  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  He will continue the ADT until 11/23.  His PSA <0.1 and his T is 17.  He has hot flashes but no other complaints.  His weight is stable.   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  His IPSS is 7 with nocturia x 2.  He has some urgency.  His UA is clear.   03/08/21: Jordan [name] returns today in f/u.  He got his last firmagon  on 02/01/21 and is due today and will continue it through the end of the year.  His PSA is down further to 0.2.  He remains on tamsulosin  and if voiding well but he has nocturia 2-3x.  His IPSS is 11.  He has had no further gross hematuria.  He had a CT hematuria study on 02/12/21 that showed some small stones but no other concerning findings.  He is for  cystoscopy today.   He has no GI complaints but is on a stool softener.  He has no weight loss or bone pain.  He has occasional hot flashes 2-3x daily.    11/30/20: Jordan [name] returns today for the history noted below.  His PSA is 1.3.  He completed EXRT on 11/07/20.  He remains on firmagon  and is due for an injection today.  He has worsening LUTS with an IPSS of 17.  He was started on tamsulosin  by Dr. Lorri Rota but he still has nocturia x 3 and urgency.  He has no GI complaints but is on metamucil and is using some miralax to help reduce the need to strain.  He has no bone pain or weight loss but has neuropathy and arthritis.  He has a few hot flashes.   08/03/20: Jordan [name] returns today in f/u.  He is a month out from firmagon  240mg  and his PSA is down to 1.2 with a T of 22.  He is scheduled for a seed implant with [employer] on 09/08/20.  He is tolerating the injection well  with just mild hot flashes. He is voiding well with an IPSS of 2.  His UA is unremarkable.   06/29/20:  Jordan [name] returns today to discuss LT-ADT as a  component of treatment of his Gleason 8 high volume prostate cancer.   I have reviewed the risks and side effects ADT in detail and will initiate firmagon  today and continue that monthly.    06/08/20: Jordan [name] returns today in f/u to discuss the results of his prostate biopsy and staging studies.  He was found to have a 35.30ml prostate with 6/6 right cores positive for cancer.  5/6 were GG4 with up to 90% involvement and 1/6 was GG3.  He has high risk disease and has undergone staging with bone scan and CT with both being negative.  He has stage T2b N0 M0 cancer.  He has DM, CAD and HTN as well as morbid obesity but swims regularly and is compliant with his meds.  He should have a reasonable 5 year life expectancy.   Hx: He  is a 78 yo male who is sent by Dr. Genita Keys for an increase in his PSA from 1.7 on 02/02/18 to 3.58 on 02/15/20.  He is voiding well with nocturia 1-2x.  His IPSS is 1. He has a good stream but it sprays a bit.  He has ED but is no longer responding to sildenafil.  He has had no UTI's or GU surgery.  His father had some prostate issues, possibly cancer, but he had other medical issues so he was not felt to be a biopsy candidate.    IPSS     Row Name 06/26/23 1100         International Prostate Symptom Score   How often have you had the sensation of not emptying your bladder? Less than 1 in 5     How often have you had to urinate less than every two hours? Less than half the time     How often have you found you stopped and started again several times when you urinated? About half the time     How often have you found it difficult to postpone urination? Less than 1 in 5 times     How often have you had a weak urinary stream? Less than 1 in 5 times     How often have you had to strain to start urination?  Less than 1 in 5 times     How many times did you typically get up at night to urinate? 3 Times     Total IPSS Score 12                   ROS:  Review of Systems   Constitutional:  Positive for malaise/fatigue.  HENT:  Positive for congestion.   Musculoskeletal:  Positive for joint pain.  Skin:  Positive for itching.  Neurological:  Positive for weakness.  Psychiatric/Behavioral:  Positive for memory loss.     Allergies  Allergen Reactions   Codeine     Rash itching   Statins Other (See Comments)    Muscle aches Muscle aches    Past Medical History:  Diagnosis Date   Arthritis    all over oa   CAD (coronary artery disease)    Cancer (HCC)    Prostate   Colon polyps 01/23/2010   Descending   Diverticulosis    dm type 2    Eczema 09/05/2020   Gastric ulcer 2010   GI bleed 2010   Hyperlipidemia    Hypertension    Hypotension    transient   Neuropathy 09/05/2020   both hands and feet   OSA (obstructive sleep apnea)    Severe, on CPAP therapy   Prostate cancer (HCC)    Wears glasses 09/05/2020    Past Surgical History:  Procedure Laterality Date   CARDIAC CATHETERIZATION  05/01/2007   Recommend rotational atherectomy followed by PTCA and probable stenting of LAD.   CARDIAC CATHETERIZATION  05/28/2007   Mid LAD hihgh grade 80-90% stenosis, stented with a 3x19mm Cypher stent implanted at 16atm, postdilated with a 3.25x43mm Quantum at 16atm, which revealed excellent wall appoistion - 5.78mm   CARDIOVASCULAR STRESS TEST  02/05/2011   Perfusion defect seen in inferior myocardial region consistent with diagphragmatic attenuation. Remaining myocardium demonstrates normal myocardial perfusion with no evidence of ischemia or infarct. ECG is positive for ischemia.   colonscopy  2012   CPAP/BIPAP SLEEP STUDY  07/01/2007   AHI-0.5/hr at 11cm water  pressure, RDI-12.6/hr   CYSTOSCOPY  09/08/2020   Procedure: CYSTOSCOPY FLEXIBLE;  Surgeon: Homero Luster, MD;  Location: [facility];  Service: Urology;;  NO SEEDS FOUND IN BLADDER   LOWER ARTERIAL DOPPLER  06/03/2007   No evidence of dissection, AV fistula, or active  pseudoaneurysm.   NASAL ENDOSCOPY  12/2008   RADIOACTIVE SEED IMPLANT N/A 09/08/2020   Procedure: RADIOACTIVE SEED IMPLANT/BRACHYTHERAPY IMPLANT;  Surgeon: Homero Luster, MD;  Location: [facility];  Service: Urology;  Laterality: N/A;  51 SEEDS IMPLANTED   SLEEP STUDY  05/13/2007   AHI-11.52/hr, AHI REM-56.0/hr, RDI-11.7/hr, RDI REM-56.0/hr, avg oxygen sat-94%   SPACE OAR INSTILLATION N/A 09/08/2020   Procedure: SPACE OAR INSTILLATION;  Surgeon: Homero Luster, MD;  Location: [facility];  Service: Urology;  Laterality: N/A;   stent to heart x 1  2010   TRANSTHORACIC ECHOCARDIOGRAM  03/13/2010   EF >55%, normal diastolic function, mild MR, mild TR, and normal RVSP    Social History   Socioeconomic History   Marital status: Married    Spouse name: Not on file   Number of children: 2   Years of education: Not on file   Highest education level: Not on file  Occupational History   Occupation: retired  Tobacco Use   Smoking status: Former    Current packs/day: 0.00    Average packs/day: 0.5  packs/day for 16.0 years (8.0 ttl pk-yrs)    Types: Cigarettes    Start date: 01/28/1962    Quit date: 01/28/1978    Years since quitting: 45.4   Smokeless tobacco: Never  Vaping Use   Vaping status: Never Used  Substance and Sexual Activity   Alcohol use: Not Currently   Drug use: No   Sexual activity: Yes  Other Topics Concern   Not on file  Social History Narrative   Not on file   Social Drivers of Health   Financial Resource Strain: Low Risk  (02/12/2021)   Overall Financial Resource Strain (CARDIA)    Difficulty of Paying Living Expenses: Not hard at all  Food Insecurity: No Food Insecurity (02/12/2021)   Hunger Vital Sign    Worried About Running Out of Food in the Last Year: Never true    Ran Out of Food in the Last Year: Never true  Transportation Needs: No Transportation Needs (02/12/2021)   PRAPARE - Administrator, Civil Service  (Medical): No    Lack of Transportation (Non-Medical): No  Physical Activity: Inactive (02/12/2021)   Exercise Vital Sign    Days of Exercise per Week: 0 days    Minutes of Exercise per Session: 0 min  Stress: No Stress Concern Present (02/12/2021)   [employer] of Occupational Health - Occupational Stress Questionnaire    Feeling of Stress : Not at all  Social Connections: Moderately Integrated (02/12/2021)   Social Connection and Isolation Panel [NHANES]    Frequency of Communication with Friends and Family: More than three times a week    Frequency of Social Gatherings with Friends and Family: Three times a week    Attends Religious Services: 1 to 4 times per year    Active Member of Clubs or Organizations: No    Attends Banker Meetings: Never    Marital Status: Married  Catering manager Violence: Not At Risk (02/12/2021)   Humiliation, Afraid, Rape, and Kick questionnaire    Fear of Current or Ex-Partner: No    Emotionally Abused: No    Physically Abused: No    Sexually Abused: No    Family History  Problem Relation Age of Onset   Stroke Brother    Hypertension Brother    Alzheimer's disease Maternal Grandmother    Stroke Maternal Grandfather    Heart disease Maternal Grandfather    Stroke Paternal Grandmother    Heart attack Paternal Grandfather    Hypertension Father    Alzheimer's disease Father    Parkinson's disease Father    Alzheimer's disease Mother    Pancreatic cancer Maternal Uncle    Breast cancer Neg Hx    Prostate cancer Neg Hx    Colon cancer Neg Hx     Anti-infectives: Anti-infectives (From admission, onward)    None       Current Outpatient Medications  Medication Sig Dispense Refill   ACCU-CHEK GUIDE test strip TEST AS DIRECTED ONCEODAILY.M     acetaminophen  (TYLENOL ) 650 MG CR tablet Take 650 mg by mouth every 8 (eight) hours. Pt takes 2 tablets in the morning and 2 tablets in the evening     Apoaequorin (PREVAGEN PO)  Take 20 mg by mouth daily.     aspirin 81 MG tablet Take 81 mg by mouth daily.     calcium  carbonate (OSCAL) 1500 (600 Ca) MG TABS tablet Take 1,500 mg by mouth 2 (two) times daily with a meal.  carvedilol  (COREG ) 3.125 MG tablet Take 1 tablet (3.125 mg total) by mouth 2 (two) times daily with a meal. 90 tablet 0   Cholecalciferol (VITAMIN D3) 25 MCG (1000 UT) CAPS Take 1 capsule by mouth 2 (two) times daily.     CINNAMON PO Take 2 tablets by mouth 2 (two) times daily. 1000 mg tablet     ezetimibe  (ZETIA ) 10 MG tablet Take 1 tablet (10 mg total) by mouth daily. 90 tablet 0   glucose blood test strip Test blood sugar 2 (two) times daily. 200 each 2   ketoconazole (NIZORAL) 2 % cream Apply 1 application topically daily.     lisinopril  (ZESTRIL ) 5 MG tablet Take 1 tablet (5 mg total) by mouth daily. 90 tablet 3   metFORMIN  (GLUCOPHAGE ) 500 MG tablet 500 mg daily with breakfast.     metFORMIN  (GLUCOPHAGE ) 500 MG tablet Take 1 tablet (500 mg total) by mouth in the morning for diabetes. 90 tablet 3   metFORMIN  (GLUCOPHAGE ) 500 MG tablet Take 1 tablet (500)mg total) by mouth in the morning for diabetes. 90 tablet 1   MOUNJARO  2.5 MG/0.5ML Pen SMARTSIG:2.5 Milligram(s) SUB-Q Once a Week     mupirocin  ointment (BACTROBAN ) 2 % Apply 1 Application topically 2 (two) times daily. 30 g 2   pantoprazole  (PROTONIX ) 40 MG tablet Take 1 tablet (40 mg total) by mouth daily. 90 tablet 3   psyllium (METAMUCIL) 58.6 % packet Take by mouth daily. 1 Tablespoon daily     simvastatin  (ZOCOR ) 40 MG tablet Take 1 tablet (40 mg total) by mouth at bedtime. 90 tablet 1   tirzepatide  (MOUNJARO ) 5 MG/0.5ML Pen Inject 5 mg into the skin once a week.     tirzepatide  (MOUNJARO ) 5 MG/0.5ML Pen Inject 5 mg into the skin every 7 (seven) days. 9 mL 1   tirzepatide  (MOUNJARO ) 5 MG/0.5ML Pen Inject 5 mg into the skin every 7 (seven) days. 9 mL 1   tirzepatide  (MOUNJARO ) 7.5 MG/0.5ML Pen Inject 7.5 mg into the skin once a week. 9 mL 1    TRUEplus Lancets 33G MISC Test blood sugar 2 (two) times daily. 200 each 2   tamsulosin  (FLOMAX ) 0.4 MG CAPS capsule Take 1 capsule (0.4 mg total) by mouth daily after supper. 90 capsule 3   No current facility-administered medications for this visit.     Objective: Vital signs in last 24 hours: BP 132/75   Pulse 73   Intake/Output from previous day: No intake/output data recorded. Intake/Output this shift: @IOTHISSHIFT @   Physical Exam Vitals reviewed.  Constitutional:      Appearance: Normal appearance.  Neurological:     Mental Status: He is alert.      Recent Results (from the past 2160 hours)  PSA     Status: None   Collection Time: 06/12/23 10:41 AM  Result Value Ref Range   Prostate Specific Ag, Serum 0.1 0.0 - 4.0 ng/mL    Comment: Roche ECLIA methodology. According to the American Urological Association, Serum PSA should decrease and remain at undetectable levels after radical prostatectomy. The AUA defines biochemical recurrence as an initial PSA value 0.2 ng/mL or greater followed by a subsequent confirmatory PSA value 0.2 ng/mL or greater. Values obtained with different assay methods or kits cannot be used interchangeably. Results cannot be interpreted as absolute evidence of the presence or absence of malignant disease.   Urinalysis, Routine w reflex microscopic     Status: None   Collection Time: 06/26/23 11:19 AM  Result Value Ref Range   Specific Gravity, UA 1.020 1.005 - 1.030   pH, UA 6.0 5.0 - 7.5   Color, UA Yellow Yellow   Appearance Ur Clear Clear   Leukocytes,UA Negative Negative   Protein,UA Negative Negative/Trace   Glucose, UA Negative Negative   Ketones, UA Negative Negative   RBC, UA Negative Negative   Bilirubin, UA Negative Negative   Urobilinogen, Ur 0.2 0.2 - 1.0 mg/dL   Nitrite, UA Negative Negative   Microscopic Examination Comment     Comment: Microscopic not indicated and not performed.    UA is  clear.  Studies/Results:  Prior notes reviewed.   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   His PSA is 0.1 which may be up from <0.1.  F/u in 6 months.   BPH with BOO and OAB.   He is doing well on tamsulosin  which I refilled.  Gross hematuria.    He has had no further bleeding.    UA is clear today.     Meds ordered this encounter  Medications   tamsulosin  (FLOMAX ) 0.4 MG CAPS capsule    Sig: Take 1 capsule (0.4 mg total) by mouth daily after supper.    Dispense:  90 capsule    Refill:  3     Orders Placed This Encounter  Procedures   Urinalysis, Routine w reflex microscopic   PSA    Standing Status:   Future    Expected Date:   12/27/2023    Expiration Date:   06/25/2024     Return in about 6 months (around 12/27/2023) for any available provider with PSA.Aaron Aas    CC: Dr. Wilburn Handler, Dr. Bartholome Ligas  and Dr. Magnus Schuller.    Homero Luster 06/27/2023 [phone] ID: Eliodoro Guerin., male   DOB: 27-Mar-1945, 78 y.o.   MRN: [id number] Patient ID: Lennyn Vasbinder., male   DOB: 1945/03/13, 78 y.o.   MRN: [id number]

## 2023-07-02 ENCOUNTER — Ambulatory Visit: Payer: HMO | Admitting: Podiatry

## 2023-07-02 ENCOUNTER — Encounter: Payer: Self-pay | Admitting: Podiatry

## 2023-07-02 VITALS — Ht 64.0 in | Wt 267.0 lb

## 2023-07-02 DIAGNOSIS — E119 Type 2 diabetes mellitus without complications: Secondary | ICD-10-CM

## 2023-07-02 DIAGNOSIS — M79675 Pain in left toe(s): Secondary | ICD-10-CM

## 2023-07-02 DIAGNOSIS — B351 Tinea unguium: Secondary | ICD-10-CM | POA: Diagnosis not present

## 2023-07-02 DIAGNOSIS — M2041 Other hammer toe(s) (acquired), right foot: Secondary | ICD-10-CM | POA: Diagnosis not present

## 2023-07-02 DIAGNOSIS — M2042 Other hammer toe(s) (acquired), left foot: Secondary | ICD-10-CM | POA: Diagnosis not present

## 2023-07-02 DIAGNOSIS — E1149 Type 2 diabetes mellitus with other diabetic neurological complication: Secondary | ICD-10-CM

## 2023-07-02 DIAGNOSIS — M79674 Pain in right toe(s): Secondary | ICD-10-CM

## 2023-07-02 DIAGNOSIS — L84 Corns and callosities: Secondary | ICD-10-CM

## 2023-07-03 ENCOUNTER — Encounter: Payer: Self-pay | Admitting: Cardiovascular Disease

## 2023-07-03 ENCOUNTER — Ambulatory Visit: Payer: Medicare HMO | Attending: Cardiovascular Disease | Admitting: Cardiovascular Disease

## 2023-07-03 DIAGNOSIS — Z01818 Encounter for other preprocedural examination: Secondary | ICD-10-CM

## 2023-07-03 DIAGNOSIS — E1159 Type 2 diabetes mellitus with other circulatory complications: Secondary | ICD-10-CM

## 2023-07-03 DIAGNOSIS — E785 Hyperlipidemia, unspecified: Secondary | ICD-10-CM | POA: Diagnosis not present

## 2023-07-03 DIAGNOSIS — K219 Gastro-esophageal reflux disease without esophagitis: Secondary | ICD-10-CM | POA: Diagnosis not present

## 2023-07-03 DIAGNOSIS — C61 Malignant neoplasm of prostate: Secondary | ICD-10-CM

## 2023-07-03 DIAGNOSIS — I2510 Atherosclerotic heart disease of native coronary artery without angina pectoris: Secondary | ICD-10-CM

## 2023-07-03 DIAGNOSIS — Z9861 Coronary angioplasty status: Secondary | ICD-10-CM | POA: Diagnosis not present

## 2023-07-03 DIAGNOSIS — I2584 Coronary atherosclerosis due to calcified coronary lesion: Secondary | ICD-10-CM | POA: Diagnosis not present

## 2023-07-03 NOTE — Progress Notes (Signed)
 Patient ID: Jordan Perlow., male   DOB: 21-May-1945, 78 y.o.   MRN: [id number]       Primary MD: Dr. Genita Keys  HPI: Jordan [name]. is a 78 y.o. male who presents to the office today for a 12 month cardiology and pre-operative evaluation.  Jordan [name] has a history of CAD , severe obstructive sleep apnea, morbid obesity, type 2 diabetes mellitus, mild essential hypertension, GERD, as well as erectile dysfunction. In April 2009 he was found to have high-grade focally calcified LAD stenosis and underwent successful high-speed rotational atherectomy and DES stenting with insertion of a 3.033 mm Cypher stent.  He has continued to be on long-term antiplatelet therapy with aspirin and Plavix .  He continues to be active.  He denies any recurrent anginal type symptomatology. His nuclear stress test in 2012 continue to suggest patency of this vessel with normal perfusion.  A two-year followup nuclear perfusion study  on 01/28/2013 demonstrated  normal perfusion without scar or ischemia;  EF was 56%.  He has a history of severe  obstructive sleep apnea originally diagnosed in 2009 and he has been on CPAP therapy since that time with 100% compliance.  He recently received a new CPAP machine after his old machine had begun to malfunction.  He was recently placed on a nasal mask and is followed by Dr.Domeiher.  He has a history of hyperlipidemia and when last seen had begun to have issues with simvastatin  causing some myalgias.  I suggest that he change this to Crestor  and he is now been on a very low-dose at 5 mg and has been taking this 2 times per week.  He has tolerated this.  Repeat blood work in May 2017 showed a total cholesterol of 153, triglycerides 80, HDL 45, and LDL 92.  An echo Doppler study on 05/11/2015 showed an ejection fraction at 55%; there were no regional wall motion abnormalities but there was  grade 2 diastolic dysfunction.  There was mitral annular calcification without regurgitation.  He  normal pulmonic pressures.  His aortic valve was normal.  In the past.  He has had bilateral knee discomfort and  underwent several injections as well as physical therapy for improvement.  He continues to be followed Dr. Wilburn Handler , for primary care.  I  saw him in November 2018 at which time he was continuing to do well from a cardiac standpoint and was without chest pain or shortness of breath.   When I last saw him in October 2019 Jordan [name] continued to do well from a cardiac standpoint.  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  He continued to swim at least 5 days/week and 1/2 mile a time doing doggy paddle.  He does admit to some fatigability.  He continued to use CPAP with 100% compliance and uses a nasal mask.  He was on aspirin and Plavix  for antiplatelet therapy, lisinopril  2.5 mg for hypertension.  He is diabetic on metformin .  He continued to take rosuvastatin  5 mg for hyperlipidemia and Protonix  for GERD.    I saw him in November 2020.  At that time he was continuing to do well and had  purposeful weight loss losing over 45 pounds.  He continued to use CPAP with 100% compliance by Dr. Albertina Hugger for his sleep apnea.  In September 2020 lipid studies were excellent with a total cholesterol 113 HDL 49 LDL 51 and triglycerides 53.  Hemoglobin A1c was stable at 5.7 and he  had normal renal function with a creatinine of 0.84.  I saw him in March 2022 at which time he remained stable from a cardiovascular standpoint and denied any chest pain, PND, orthopnea or palpitations.  He was continuing to swim.  He is in need to undergo a prostate biopsy with Dr. Homero Luster to further evaluate a small nodule.  He was continuing to use CPAP.  I saw him on May 21, 2021.  Since his prior evaluation he was diagnosed with prostate CA stage II and is followed by Dr. Inga Manges.  He had undergone his radiation seed implant and has also had 25 radiation treatments.  He apparently stopped using CPAP therapy.  He states  he is sleeping well.  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  He has not been swimming.  He denies any chest pain or palpitations.  An Epworth Sleepiness Scale score was calculated in the office and this endorsed at 7 arguing against residual daytime sleepiness.   I last saw him on Jun 26, 2018 for.  He denied any chest pain or significant shortness of breath.  He has had issues with his knees and may ultimately require future knee surgery.  He has seen Dr. Charol Copas for orthopedic evaluation and was told that prior to surgery he would need to lose at least 40 pounds.  He sees Dr. Wilburn Handler for primary care.  He recently underwent laboratory on June 24, 2022.  Lipid studies were excellent with total cholesterol 96, HDL 52, triglycerides 80, and LDL 28.  He has continued to be on simvastatin  40 mg daily.  Hemoglobin A1c was 6.0.  Approximately 6 weeks ago he was recently started on Mounjaro .  He has lost approximately 8 to 10 pounds since initiation.  He continues to be on metformin  as well for his diabetes.  He is on carvedilol  3.125 twice a day, lisinopril  5 mg daily for blood pressure and CAD.  He is on pantoprazole  for GERD.    He is followed by Dr. Homero Luster with his history of prostate cancer and last saw him on Jun 26, 2023.  He is status post external radiation and ADT with recent PSA 0.1.  He has no bone pain and no hematuria.  He planning to undergo left knee surgery by Dr. Murrell Arrant and needs cardiology clearance.  He continues to be on carvedilol  which he only takes 3.125 mg once a day due to slow heart rate.  Normal 50s.  He also is on lisinopril  5 mg and takes baby aspirin.  He is diabetic on metformin  and Mounjaro  complaints pantoprazole  for GERD.  He is on Zetia  10 mg and simvastatin  40 mg for hyperlipidemia.  Most recent laboratory on March 10, 2023 showed total cholesterol 108, HDL 57, LDL 36 and triglycerides 70.  Hemoglobin A1c was 5.5.   He is no longer on CPAP therapy with his weight loss.  He presents for cardiology and preoperative evaluation.   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   CARDIAC CATHETERIZATION  05/28/2007   Mid LAD hihgh grade 80-90% stenosis, stented with a 3x45mm Cypher stent implanted at 16atm, postdilated with a 3.25x89mm Quantum at 16atm, which revealed excellent wall appoistion - 5.5mm   CARDIOVASCULAR STRESS TEST  02/05/2011   Perfusion defect seen in inferior myocardial region consistent with diagphragmatic attenuation. Remaining myocardium demonstrates normal myocardial perfusion with no evidence of ischemia or infarct. ECG is positive for ischemia.   colonscopy  2012   CPAP/BIPAP SLEEP STUDY  07/01/2007   AHI-0.5/hr at 11cm water  pressure, RDI-12.6/hr   CYSTOSCOPY  09/08/2020   Procedure: CYSTOSCOPY FLEXIBLE;  Surgeon: Homero Luster, MD;  Location: [facility];  Service: Urology;;  NO SEEDS FOUND IN BLADDER   LOWER ARTERIAL DOPPLER  06/03/2007   No evidence of dissection, AV fistula, or active pseudoaneurysm.   NASAL ENDOSCOPY  12/2008   RADIOACTIVE SEED IMPLANT N/A 09/08/2020   Procedure: RADIOACTIVE SEED IMPLANT/BRACHYTHERAPY IMPLANT;  Surgeon: Homero Luster, MD;  Location[facility];  Service: Urology;  Laterality: N/A;  51 SEEDS IMPLANTED   SLEEP  STUDY  05/13/2007   AHI-11.52/hr, AHI REM-56.0/hr, RDI-11.7/hr, RDI REM-56.0/hr, avg oxygen sat-94%   SPACE OAR INSTILLATION N/A 09/08/2020   Procedure: SPACE OAR INSTILLATION;  Surgeon: Homero Luster, MD;  Location: [facility];  Service: Urology;  Laterality: N/A;   stent to heart x 1  2010   TRANSTHORACIC ECHOCARDIOGRAM  03/13/2010   EF >55%, normal diastolic function, mild MR, mild TR, and normal RVSP    Allergies  Allergen Reactions   Codeine     Rash itching   Statins Other (See Comments)    Muscle aches Muscle aches    Current Outpatient Medications  Medication Sig Dispense Refill   ACCU-CHEK GUIDE test strip TEST AS DIRECTED ONCEODAILY.M     acetaminophen  (TYLENOL ) 650 MG CR tablet Take 650 mg by mouth every 8 (eight) hours. Pt takes 2 tablets in the morning and 2 tablets in the evening     Apoaequorin (PREVAGEN PO) Take 20 mg by mouth daily.     aspirin 81 MG tablet Take 81 mg by mouth daily.     calcium  carbonate (OSCAL) 1500 (600 Ca) MG TABS tablet Take 1,500 mg by mouth 2 (two) times daily with a meal.     carvedilol  (COREG ) 3.125 MG tablet Take 1 tablet (3.125 mg total) by mouth 2 (two) times daily with a meal. 90 tablet 0   Cholecalciferol (VITAMIN D3) 25 MCG (1000 UT) CAPS Take 1 capsule by mouth 2 (two) times daily.     CINNAMON PO Take 2 tablets by mouth 2 (two) times daily. 1000 mg tablet     ezetimibe  (ZETIA ) 10 MG tablet Take 1 tablet (10 mg total) by mouth daily. 90 tablet 0   glucose blood test strip Test blood sugar 2 (two) times daily. 200 each 2   ketoconazole (NIZORAL) 2 % cream Apply 1 application topically daily.     lisinopril  (ZESTRIL ) 5 MG tablet Take 1 tablet (5 mg total) by mouth daily. 90 tablet 3   metFORMIN  (GLUCOPHAGE ) 500 MG tablet 500 mg daily with breakfast.  MOUNJARO  2.5 MG/0.5ML Pen SMARTSIG:2.5 Milligram(s) SUB-Q Once a Week     mupirocin  ointment (BACTROBAN ) 2 % Apply 1 Application topically 2 (two) times daily. 30 g 2    pantoprazole  (PROTONIX ) 40 MG tablet Take 1 tablet (40 mg total) by mouth daily. 90 tablet 3   psyllium (METAMUCIL) 58.6 % packet Take by mouth daily. 1 Tablespoon daily     simvastatin  (ZOCOR ) 40 MG tablet Take 1 tablet (40 mg total) by mouth at bedtime. 90 tablet 1   tamsulosin  (FLOMAX ) 0.4 MG CAPS capsule Take 1 capsule (0.4 mg total) by mouth daily after supper. 90 capsule 3   tirzepatide  (MOUNJARO ) 5 MG/0.5ML Pen Inject 5 mg into the skin every 7 (seven) days. 9 mL 1   tirzepatide  (MOUNJARO ) 7.5 MG/0.5ML Pen Inject 7.5 mg into the skin once a week. 9 mL 1   TRUEplus Lancets 33G MISC Test blood sugar 2 (two) times daily. 200 each 2   No current facility-administered medications for this visit.    Social History   Socioeconomic History   Marital status: Married    Spouse name: Not on file   Number of children: 2   Years of education: Not on file   Highest education level: Not on file  Occupational History   Occupation: retired  Tobacco Use   Smoking status: Former    Current packs/day: 0.00    Average packs/day: 0.5 packs/day for 16.0 years (8.0 ttl pk-yrs)    Types: Cigarettes    Start date: 01/28/1962    Quit date: 01/28/1978    Years since quitting: 45.4   Smokeless tobacco: Never  Vaping Use   Vaping status: Never Used  Substance and Sexual Activity   Alcohol use: Not Currently   Drug use: No   Sexual activity: Yes  Other Topics Concern   Not on file  Social History Narrative   Not on file   Social Drivers of Health   Financial Resource Strain: Low Risk  (02/12/2021)   Overall Financial Resource Strain (CARDIA)    Difficulty of Paying Living Expenses: Not hard at all  Food Insecurity: No Food Insecurity (02/12/2021)   Hunger Vital Sign    Worried About Running Out of Food in the Last Year: Never true    Ran Out of Food in the Last Year: Never true  Transportation Needs: No Transportation Needs (02/12/2021)   PRAPARE - Administrator, Civil Service  (Medical): No    Lack of Transportation (Non-Medical): No  Physical Activity: Inactive (02/12/2021)   Exercise Vital Sign    Days of Exercise per Week: 0 days    Minutes of Exercise per Session: 0 min  Stress: No Stress Concern Present (02/12/2021)   [employer] of Occupational Health - Occupational Stress Questionnaire    Feeling of Stress : Not at all  Social Connections: Moderately Integrated (02/12/2021)   Social Connection and Isolation Panel [NHANES]    Frequency of Communication with Friends and Family: More than three times a week    Frequency of Social Gatherings with Friends and Family: Three times a week    Attends Religious Services: 1 to 4 times per year    Active Member of Clubs or Organizations: No    Attends Banker Meetings: Never    Marital Status: Married  Catering manager Violence: Not At Risk (02/12/2021)   Humiliation, Afraid, Rape, and Kick questionnaire    Fear of Current or Ex-Partner: No    Emotionally Abused:  No    Physically Abused: No    Sexually Abused: No   Socially he is married and has 2 children. No tobacco or alcohol use.  Family History  Problem Relation Age of Onset   Stroke Brother    Hypertension Brother    Alzheimer's disease Maternal Grandmother    Stroke Maternal Grandfather    Heart disease Maternal Grandfather    Stroke Paternal Grandmother    Heart attack Paternal Grandfather    Hypertension Father    Alzheimer's disease Father    Parkinson's disease Father    Alzheimer's disease Mother    Pancreatic cancer Maternal Uncle    Breast cancer Neg Hx    Prostate cancer Neg Hx    Colon cancer Neg Hx     ROS General: Negative; No fevers, chills, or night sweats; mild fatigue HEENT: Negative; No changes in vision or hearing, sinus congestion, difficulty swallowing Pulmonary: Negative; No cough, wheezing, shortness of breath, hemoptysis Cardiovascular: See history of present illness; No chest pain, presyncope,  syncope, palpitations GI: GERD, controlled with pantoprazole  GU: Positive for erectile dysfunction; stage II prostate CA, status post radiation Musculoskeletal: Left knee discomfort Hematologic/Oncology: Prostate CA, followed by Dr. Inga Manges Endocrine: Negative; no heat/cold intolerance; no diabetes Neuro: Neuropathy of hands Skin: Negative; No rashes or skin lesions Psychiatric: Negative; No behavioral problems, deprression. Sleep: Positive for obstructive sleep apnea which is severe, previously on CPAP.  Stop using CPAP.  A download was obtained which showed only 1 day of use since November 2022.   Other comprehensive 14 point system review is negative.  PE BP (!) 122/58 (BP Location: Left Arm, Patient Position: Sitting, Cuff Size: Normal)   Pulse (!) 53   Ht 5\' 4"  (1.626 m)   Wt 267 lb (121.1 kg)   SpO2 96%   BMI 45.83 kg/m    Repeat blood pressure by me 112/66  Wt Readings from Last 3 Encounters:  07/03/23 267 lb (121.1 kg)  07/02/23 267 lb (121.1 kg)  03/12/23 267 lb (121.1 kg)   General: Alert, oriented, no distress.  Morbidly obese with BMI 45.8 Skin: normal turgor, no rashes, warm and dry HEENT: Normocephalic, atraumatic. Pupils equal round and reactive to light; sclera anicteric; extraocular muscles intact;  Nose without nasal septal hypertrophy Mouth/Parynx benign; Mallinpatti scale 3 Neck: No JVD, no carotid bruits; normal carotid upstroke Lungs: clear to ausculatation and percussion; no wheezing or rales Chest wall: without tenderness to palpitation Heart: PMI not displaced, RRR, s1 s2 normal, 1/6 systolic murmur, no diastolic murmur, no rubs, gallops, thrills, or heaves Abdomen: Central adiposity; soft, nontender; no hepatosplenomehaly, BS+; abdominal aorta nontender and not dilated by palpation. Back: no CVA tenderness Pulses 2+ Musculoskeletal: Left knee brace, normal strength, no joint deformities Extremities: no clubbing cyanosis or edema, Homan's sign negative   Neurologic: grossly nonfocal; Cranial nerves grossly wnl Psychologic: Normal mood and Affect  EKG Interpretation Date/Time:  Thursday Jul 03 2023 09:43:57 EDT Ventricular Rate:  50 PR Interval:  202 QRS Duration:  90 QT Interval:  398 QTC Calculation: 362 R Axis:   12  Text Interpretation: Sinus bradycardia When compared with ECG of 24-Jan-2009 02:07, Vent. rate has decreased BY  37 BPM QT has shortened Confirmed by Magnus Schuller ([id number]) on 07/03/2023 9:56:55 AM    Jun 26, 2022   ECG (independently read by me): NSR at 63   May 21, 2021 ECG (independently read by me): NSR at 62, no ectopy  April 25, 2020 ECG (independently read  by me): Sinus bradycardia at 49  November 2020 ECG (independently read by me): Sinus Bradycardia ay 56; No ST changes; no ectopy; normal intervals    October 2019 ECG (independently read by me): Sinus bradycardia at 46 bpm.  No ectopy.  Normal intervals.  November 2018 ECG (independently read by me): Sinus bradycardia 43 bpm normal intervals.  No ST segment changes.  May 2018 ECG (independently read by me): Sinus bradycardia 47 bpm.  No ST segment changes.  Normal intervals.  ECG (independently read by me): Sinus bradycardia 52 bpm.  No significant ST changes.  Normal intervals.  June 2017 ECG (independently read by me): Sinus bradycardia 50 bpm.  No ectopy.  February 2017 ECG (independently read by me):  Sinus bradycardia with occasional PVC.  Heart rate 55 bpm.  QTc interval 369 ms.  February 2016 ECG (independently read by me): Sinus bradycardia 50 bpm.  No ectopy.  Normal intervals.   LABS:     Latest Ref Rng & Units 02/12/2021    9:46 AM 09/05/2020    9:22 AM 06/01/2020    9:46 AM  BMP  Glucose 70 - 99 mg/dL  956    BUN 8 - 23 mg/dL  12    Creatinine 2.13 - 1.24 mg/dL 0.86  5.78  4.69   Sodium 135 - 145 mmol/L  141    Potassium 3.5 - 5.1 mmol/L  4.1    Chloride 98 - 111 mmol/L  105    CO2 22 - 32 mmol/L  29    Calcium  8.9 - 10.3 mg/dL  9.5         Latest Ref Rng & Units 09/05/2020    9:22 AM 10/03/2016    7:40 AM 11/17/2015    7:49 AM  Hepatic Function  Total Protein 6.5 - 8.1 g/dL 7.9  6.8  7.0   Albumin 3.5 - 5.0 g/dL 4.1  3.8  3.8   AST 15 - 41 U/L 19  15  16    ALT 0 - 44 U/L 19  14  14    Alk Phosphatase 38 - 126 U/L 58  67  65   Total Bilirubin 0.3 - 1.2 mg/dL 0.6  0.4  0.6       Latest Ref Rng & Units 09/05/2020    9:22 AM 03/28/2009    3:19 PM 01/26/2009    5:00 AM  CBC  WBC 4.0 - 10.5 K/uL 8.0  10.3  10.8   Hemoglobin 13.0 - 17.0 g/dL 62.9  52.8  8.4   Hematocrit 39.0 - 52.0 % 44.3  40.9  24.8   Platelets 150 - 400 K/uL 238  234.0  190    Lab Results  Component Value Date   MCV 91.3 09/05/2020   MCV 90.6 03/28/2009   MCV 91.6 01/26/2009   No results found for: "TSH"   No results found for: "HGBA1C"  Lipid Panel     Component Value Date/Time   CHOL 108 10/03/2016 0740   TRIG 48 10/03/2016 0740   HDL 49 10/03/2016 0740   CHOLHDL 2.2 10/03/2016 0740   VLDL 10 10/03/2016 0740   LDLCALC 49 10/03/2016 0740     RADIOLOGY: No results found.  IMPRESSION:  1. Preoperative clearance   2. Coronary artery disease due to calcified coronary lesion   3. History of percutaneous coronary intervention: 2009   4. Hyperlipidemia with target LDL less than 55   5. Type 2 diabetes mellitus with other circulatory complications (HCC)  6. Prostate CA (HCC)   7. Gastroesophageal reflux disease, unspecified whether esophagitis present     ASSESSMENT AND PLAN: Jordan [name] is a 20- year-old African-American male who has a  history of morbid obesity, hypertension, obstructive sleep apnea, and CAD.  He underwent high-speed rotational artherectomy of a severely calcified focal LAD stenosis with insertion of a 3.0x30 mm DES Cypher stent in April 2009.  He has continued to do exceptionally well and and has been without recurrent anginal symptomatology. A nuclear perfusion study in December 2014  demonstrated normal perfusion  without scar or ischemia, ejection fraction was 56%.  He denies any change and is without chest pain palpitations pre-syncope or syncope.  He had been on CPAP therapy for obstructive sleep apnea and self discontinued this.  He believes he is sleeping well.  He has type 2 diabetes mellitus and is on metformin  500 mg daily and recently was started on Mounjaro  weekly injection.  Most recent hemoglobin A1c was 6.0.  He has lost approximately 30 pounds with Mounjaro  and is she Asian.  He was diagnosed with prostate CA followed by Dr. Homero Luster and had undergone radiation therapy in addition to radiation implants.  This has been stable.  He has developed progressive left knee discomfort and is tentatively scheduled to undergo left knee replacement by Dr. Neil Balls.  Cardiology wise he has been stable and has been without anginal symptomatology, shortness of breath, palpitations or arrhythmia.  Blood pressure today is stable and on repeat by me was 112/66.    ECG today shows sinus rhythm with bradycardia at 53 bpm.  Presently, he is stable.  He is on simvastatin  for hyperlipidemia with LDL cholesterol at 36 in January 2025.  Hemoglobin A1c was 5.5.  Creatinine was 0.74.  I discussed with him plan plan for upcoming retirement.  He lives in [place] and I will transition him to the care of Dr. Delsa Fife at our [place] office for cardiology follow-up.  It has been a pleasure taking care of him for these many years.   Millicent Ally, MD, [facility]  07/05/2023 3:16 PM

## 2023-07-03 NOTE — Patient Instructions (Signed)
 Medication Instructions:  Your physician recommends that you continue on your current medications as directed. Please refer to the Current Medication list given to you today.  *If you need a refill on your cardiac medications before your next appointment, please call your pharmacy*  Lab Work: NONE If you have labs (blood work) drawn today and your tests are completely normal, you will receive your results only by: MyChart Message (if you have MyChart) OR A paper copy in the mail If you have any lab test that is abnormal or we need to change your treatment, we will call you to review the results.  Testing/Procedures: NONE  Follow-Up: At [facility], you and your health needs are our priority.  As part of our continuing mission to provide you with exceptional heart care, our providers are all part of one team.  This team includes your primary Cardiologist (physician) and Advanced Practice Providers or APPs (Physician Assistants and Nurse Practitioners) who all work together to provide you with the care you need, when you need it.  Your next appointment:   1 year(s)  Provider:   Vishnu Mallipeddi, MD   We recommend signing up for the patient portal called "MyChart".  Sign up information is provided on this After Visit Summary.  MyChart is used to connect with patients for Virtual Visits (Telemedicine).  Patients are able to view lab/test results, encounter notes, upcoming appointments, etc.  Non-urgent messages can be sent to your provider as well.   To learn more about what you can do with MyChart, go to [email or web address].   Other Instructions

## 2023-07-04 ENCOUNTER — Other Ambulatory Visit (HOSPITAL_COMMUNITY): Payer: Self-pay

## 2023-07-05 ENCOUNTER — Encounter: Payer: Self-pay | Admitting: Cardiovascular Disease

## 2023-07-06 NOTE — Progress Notes (Signed)
 ANNUAL DIABETIC FOOT EXAM  Subjective: Jordan [name]. presents today for annual diabetic foot exam.  Chief Complaint  Patient presents with   Nail Problem    Pt is here for [facility] last A1C 5.3 PCP is Dr Genita Keys and LOV was in January.   Patient confirms h/o diabetes.  Patient denies any h/o foot wounds.  Patient has been diagnosed with neuropathy.  Jordan Handler, MD is patient's PCP.  Past Medical History:  Diagnosis Date   Arthritis    all over oa   CAD (coronary artery disease)    Cancer (HCC)    Prostate   Colon polyps 01/23/2010   Descending   Diverticulosis    dm type 2    Eczema 09/05/2020   Gastric ulcer 2010   GI bleed 2010   Hyperlipidemia    Hypertension    Hypotension    transient   Neuropathy 09/05/2020   both hands and feet   OSA (obstructive sleep apnea)    Severe, on CPAP therapy   Prostate cancer (HCC)    Wears glasses 09/05/2020   Patient Active Problem List   Diagnosis Date Noted   Osteoarthritis of left knee 09/12/2021   Pain in joint of left knee 09/07/2021   Elevated PSA 11/30/2020   Malignant neoplasm of prostate (HCC) 06/21/2020   Bilateral hand pain 06/17/2016   Neck pain 06/17/2016   Type 2 diabetes mellitus with other circulatory complications (HCC) 04/20/2015   History of gastric ulcer 05/23/2014   OSA on CPAP 03/04/2013   Morbid obesity (HCC) 03/04/2013   CAD (coronary artery disease) 03/04/2013   Hyperlipidemia with target LDL less than 70 03/04/2013   Type 2 diabetes mellitus (HCC) 03/04/2013   GERD (gastroesophageal reflux disease) 03/04/2013   Vertigo 03/04/2013   Acute gastric ulcer 03/28/2009   Past Surgical History:  Procedure Laterality Date   CARDIAC CATHETERIZATION  05/01/2007   Recommend rotational atherectomy followed by PTCA and probable stenting of LAD.   CARDIAC CATHETERIZATION  05/28/2007   Mid LAD hihgh grade 80-90% stenosis, stented with a 3x57mm Cypher stent implanted at 16atm, postdilated with a  3.25x14mm Quantum at 16atm, which revealed excellent wall appoistion - 5.2mm   CARDIOVASCULAR STRESS TEST  02/05/2011   Perfusion defect seen in inferior myocardial region consistent with diagphragmatic attenuation. Remaining myocardium demonstrates normal myocardial perfusion with no evidence of ischemia or infarct. ECG is positive for ischemia.   colonscopy  2012   CPAP/BIPAP SLEEP STUDY  07/01/2007   AHI-0.5/hr at 11cm water  pressure, RDI-12.6/hr   CYSTOSCOPY  09/08/2020   Procedure: CYSTOSCOPY FLEXIBLE;  Surgeon: Jordan Luster, MD;  Location: [facility];  Service: Urology;;  NO SEEDS FOUND IN BLADDER   LOWER ARTERIAL DOPPLER  06/03/2007   No evidence of dissection, AV fistula, or active pseudoaneurysm.   NASAL ENDOSCOPY  12/2008   RADIOACTIVE SEED IMPLANT N/A 09/08/2020   Procedure: RADIOACTIVE SEED IMPLANT/BRACHYTHERAPY IMPLANT;  Surgeon: Jordan Luster, MD;  Location: [facility];  Service: Urology;  Laterality: N/A;  51 SEEDS IMPLANTED   SLEEP STUDY  05/13/2007   AHI-11.52/hr, AHI REM-56.0/hr, RDI-11.7/hr, RDI REM-56.0/hr, avg oxygen sat-94%   SPACE OAR INSTILLATION N/A 09/08/2020   Procedure: SPACE OAR INSTILLATION;  Surgeon: Jordan Luster, MD;  Location: [facility];  Service: Urology;  Laterality: N/A;   stent to heart x 1  2010   TRANSTHORACIC ECHOCARDIOGRAM  03/13/2010   EF >55%, normal diastolic function, mild MR, mild TR, and normal  RVSP   Current Outpatient Medications on File Prior to Visit  Medication Sig Dispense Refill   ACCU-CHEK GUIDE test strip TEST AS DIRECTED ONCEODAILY.M     acetaminophen  (TYLENOL ) 650 MG CR tablet Take 650 mg by mouth every 8 (eight) hours. Pt takes 2 tablets in the morning and 2 tablets in the evening     Apoaequorin (PREVAGEN PO) Take 20 mg by mouth daily.     aspirin 81 MG tablet Take 81 mg by mouth daily.     calcium  carbonate (OSCAL) 1500 (600 Ca) MG TABS tablet Take 1,500 mg by mouth 2 (two) times daily  with a meal.     carvedilol  (COREG ) 3.125 MG tablet Take 1 tablet (3.125 mg total) by mouth 2 (two) times daily with a meal. 90 tablet 0   Cholecalciferol (VITAMIN D3) 25 MCG (1000 UT) CAPS Take 1 capsule by mouth 2 (two) times daily.     CINNAMON PO Take 2 tablets by mouth 2 (two) times daily. 1000 mg tablet     ezetimibe  (ZETIA ) 10 MG tablet Take 1 tablet (10 mg total) by mouth daily. 90 tablet 0   glucose blood test strip Test blood sugar 2 (two) times daily. 200 each 2   ketoconazole (NIZORAL) 2 % cream Apply 1 application topically daily.     lisinopril  (ZESTRIL ) 5 MG tablet Take 1 tablet (5 mg total) by mouth daily. 90 tablet 3   metFORMIN  (GLUCOPHAGE ) 500 MG tablet 500 mg daily with breakfast.     MOUNJARO  2.5 MG/0.5ML Pen SMARTSIG:2.5 Milligram(s) SUB-Q Once a Week     mupirocin  ointment (BACTROBAN ) 2 % Apply 1 Application topically 2 (two) times daily. 30 g 2   pantoprazole  (PROTONIX ) 40 MG tablet Take 1 tablet (40 mg total) by mouth daily. 90 tablet 3   psyllium (METAMUCIL) 58.6 % packet Take by mouth daily. 1 Tablespoon daily     simvastatin  (ZOCOR ) 40 MG tablet Take 1 tablet (40 mg total) by mouth at bedtime. 90 tablet 1   tamsulosin  (FLOMAX ) 0.4 MG CAPS capsule Take 1 capsule (0.4 mg total) by mouth daily after supper. 90 capsule 3   tirzepatide  (MOUNJARO ) 5 MG/0.5ML Pen Inject 5 mg into the skin every 7 (seven) days. 9 mL 1   tirzepatide  (MOUNJARO ) 7.5 MG/0.5ML Pen Inject 7.5 mg into the skin once a week. 9 mL 1   TRUEplus Lancets 33G MISC Test blood sugar 2 (two) times daily. 200 each 2   No current facility-administered medications on file prior to visit.    Allergies  Allergen Reactions   Codeine     Rash itching   Statins Other (See Comments)    Muscle aches Muscle aches   Social History   Occupational History   Occupation: retired  Tobacco Use   Smoking status: Former    Current packs/day: 0.00    Average packs/day: 0.5 packs/day for 16.0 years (8.0 ttl pk-yrs)     Types: Cigarettes    Start date: 01/28/1962    Quit date: 01/28/1978    Years since quitting: 45.4   Smokeless tobacco: Never  Vaping Use   Vaping status: Never Used  Substance and Sexual Activity   Alcohol use: Not Currently   Drug use: No   Sexual activity: Yes   Family History  Problem Relation Age of Onset   Stroke Brother    Hypertension Brother    Alzheimer's disease Maternal Grandmother    Stroke Maternal Grandfather    Heart disease Maternal  Grandfather    Stroke Paternal Grandmother    Heart attack Paternal Grandfather    Hypertension Father    Alzheimer's disease Father    Parkinson's disease Father    Alzheimer's disease Mother    Pancreatic cancer Maternal Uncle    Breast cancer Neg Hx    Prostate cancer Neg Hx    Colon cancer Neg Hx    Immunization History  Administered Date(s) Administered   Influenza-Unspecified 12/26/2017, 11/08/2020   Moderna Covid-19 Vaccine Bivalent Booster 58yrs & up 12/06/2020     Review of Systems: Negative except as noted in the HPI.   Objective: There were no vitals filed for this visit.  Jordan [name]. is a pleasant 78 y.o. male in NAD. AAO X 3.  Diabetic foot exam was performed with the following findings:   Vascular Examination: Capillary refill time immediate b/l. Vascular status intact b/l with palpable pedal pulses. Pedal hair present b/l. No pain with calf compression b/l. Skin temperature gradient WNL b/l. No cyanosis or clubbing b/l. No ischemia or gangrene noted b/l.   Neurological Examination: Sensation grossly intact b/l with 10 gram monofilament. Vibratory sensation intact b/l.   Dermatological Examination: Pedal skin with normal turgor, texture and tone b/l.  No open wounds. No interdigital macerations.   Toenails 1-5 b/l thick, discolored, elongated with subungual debris and pain on dorsal palpation.   Hyperkeratotic lesion(s) distal tip of left 2nd toe and distal tip of right 2nd toe.  No erythema, no  edema, no drainage, no fluctuance.  Musculoskeletal Examination: Muscle strength 5/5 to all lower extremity muscle groups bilaterally. No pain, crepitus or joint limitation noted with ROM bilateral LE. Hammertoe deformity noted 2-5 b/l.  Radiographs: None    ADA Risk Categorization: Low Risk :  Patient has all of the following: Intact protective sensation No prior foot ulcer  No severe deformity Pedal pulses present  Assessment: 1. Pain due to onychomycosis of toenails of both feet   2. Corns   3. Acquired hammertoes of both feet   4. Type II diabetes mellitus with neurological manifestations (HCC)   5. Encounter for diabetic foot exam [facility])     Plan: Consent given for treatment. Diabetic foot examination performed. All patient's and/or POA's questions/concerns addressed on today's visit. Mycotic toenails 1-5 debrided in length and girth without incident. Corn(s) bilateral 2nd toes pared with sharp debridement without incident. Continue foot and shoe inspections daily. Monitor blood glucose per PCP/Endocrinologist's recommendations.Continue soft, supportive shoe gear daily. Report any pedal injuries to medical professional. Call office if there are any quesitons/concerns. -Patient/POA to call should there be question/concern in the interim. Return in about 3 months (around 10/02/2023).  Luella Sager, DPM      [place] LOCATION: [address], [place] [address]                   Office [phone]   [facility] LOCATION: [address] [place], [place] [address] Office [phone]

## 2023-07-07 ENCOUNTER — Telehealth: Payer: Self-pay | Admitting: Cardiovascular Disease

## 2023-07-07 NOTE — Telephone Encounter (Signed)
 Pt spoke to Dr Loetta Ringer about who to see since he is retiring. Pt was going to start seeing someone in [place]. But he said he has thought about it and he wants to stay in [place] because he is treated so well here. So he would like for Dr Loetta Ringer to recommend someone at [facility].

## 2023-07-07 NOTE — Telephone Encounter (Signed)
 Spoke with Jordan [name], aware dr Loetta Ringer will be back in the office wednesday this week and will forward for his review.

## 2023-07-08 DIAGNOSIS — G4733 Obstructive sleep apnea (adult) (pediatric): Secondary | ICD-10-CM | POA: Diagnosis not present

## 2023-07-08 DIAGNOSIS — Z01818 Encounter for other preprocedural examination: Secondary | ICD-10-CM | POA: Diagnosis not present

## 2023-07-08 DIAGNOSIS — R001 Bradycardia, unspecified: Secondary | ICD-10-CM | POA: Diagnosis not present

## 2023-07-08 DIAGNOSIS — E1169 Type 2 diabetes mellitus with other specified complication: Secondary | ICD-10-CM | POA: Diagnosis not present

## 2023-07-08 DIAGNOSIS — I119 Hypertensive heart disease without heart failure: Secondary | ICD-10-CM | POA: Diagnosis not present

## 2023-07-08 DIAGNOSIS — Z0000 Encounter for general adult medical examination without abnormal findings: Secondary | ICD-10-CM | POA: Diagnosis not present

## 2023-07-08 DIAGNOSIS — M150 Primary generalized (osteo)arthritis: Secondary | ICD-10-CM | POA: Diagnosis not present

## 2023-07-08 DIAGNOSIS — C61 Malignant neoplasm of prostate: Secondary | ICD-10-CM | POA: Diagnosis not present

## 2023-07-08 DIAGNOSIS — I2510 Atherosclerotic heart disease of native coronary artery without angina pectoris: Secondary | ICD-10-CM | POA: Diagnosis not present

## 2023-07-08 DIAGNOSIS — G4730 Sleep apnea, unspecified: Secondary | ICD-10-CM | POA: Diagnosis not present

## 2023-07-08 DIAGNOSIS — M1712 Unilateral primary osteoarthritis, left knee: Secondary | ICD-10-CM | POA: Diagnosis not present

## 2023-07-08 DIAGNOSIS — I10 Essential (primary) hypertension: Secondary | ICD-10-CM | POA: Diagnosis not present

## 2023-07-17 ENCOUNTER — Telehealth: Payer: Self-pay | Admitting: Cardiovascular Disease

## 2023-07-17 NOTE — Telephone Encounter (Signed)
   Patient Name: Jordan [name].  DOB: 10-09-1945 MRN: [id number]  Primary Cardiologist: Magnus Schuller, MD  Chart reviewed as part of pre-operative protocol coverage. Given past medical history and time since last visit, based on ACC/AHA guidelines, Jordan [name]. is at acceptable risk for the planned procedure without further cardiovascular testing.   The patient was advised that if he develops new symptoms prior to surgery to contact our office to arrange for a follow-up visit, and he verbalized understanding.  Regarding ASA therapy, we recommend continuation of ASA throughout the perioperative period.  However, if the surgeon feels that cessation of ASA is required in the perioperative period, it may be stopped 5-7 days prior to surgery with a plan to resume it as soon as felt to be feasible from a surgical standpoint in the post-operative period.   I will route this recommendation to the requesting party via Epic fax function and remove from pre-op pool.  Please call with questions.  Francene Ing, Retha Cast, NP 07/17/2023, 11:56 AM

## 2023-07-17 NOTE — Telephone Encounter (Signed)
 Made preop APP Charles Connor, NP who stated patient has been cleared and will forward clearance to requesting office I called and spoke to [facility] from Dr. Murrell Arrant office made her aware clearance is being sent

## 2023-07-17 NOTE — Telephone Encounter (Signed)
 Follow Up:    Marily Shows called to check on the status of this patient's clearance.

## 2023-07-23 ENCOUNTER — Other Ambulatory Visit: Payer: Self-pay | Admitting: Orthopedic Surgery

## 2023-07-28 DIAGNOSIS — R262 Difficulty in walking, not elsewhere classified: Secondary | ICD-10-CM | POA: Diagnosis not present

## 2023-07-28 DIAGNOSIS — M25662 Stiffness of left knee, not elsewhere classified: Secondary | ICD-10-CM | POA: Diagnosis not present

## 2023-07-28 DIAGNOSIS — M1732 Unilateral post-traumatic osteoarthritis, left knee: Secondary | ICD-10-CM | POA: Diagnosis not present

## 2023-08-01 NOTE — Progress Notes (Addendum)
 Anesthesia Review:  PCP: Wilburn Handler  Called and requested clearance from [facility] at office.  [facility] OV note dated 07/08/23 in media Tab dated 08/05/2023 Cardiologist : DR Electa Grieve LOV 07/03/23  Urology- DR Inga Manges LOV 06/26/23   PPM/ ICD: Device Orders: Rep Notified:  Chest x-ray : EKG : 07/03/23  Echo : 2017 Stress test: 2019  Cardiac Cath :  2009   Activity level: can do a flight of stairs without difficudtly  Sleep Study/ CPAP : has sleep apnea no longer uses cpap  Fasting Blood Sugar :      / Checks Blood Sugar -- times a day:     DM- type 2- checks glucose periodically per pt  Hgba1c-08/14/2023  Metformn- none am of surgery  Mounjaro - last dose on 08/06/23  Blood Thinner/ Instructions /Last Dose: ASA / Instructions/ Last Dose :   81 mg aspirin   0901- [facility] on cell phone and house phone.    PT was 20 minutes late for preop appt.

## 2023-08-01 NOTE — Patient Instructions (Addendum)
 SURGICAL WAITING ROOM VISITATION  Patients having surgery or a procedure may have no more than 2 support people in the waiting area - these visitors may rotate.    Children under the age of 45 must have an adult with them who is not the patient.  Visitors with respiratory illnesses are discouraged from visiting and should remain at home.  If the patient needs to stay at the hospital during part of their recovery, the visitor guidelines for inpatient rooms apply. Pre-op nurse will coordinate an appropriate time for 1 support person to accompany patient in pre-op.  This support person may not rotate.    Please refer to the [facility] website for the visitor guidelines for Inpatients (after your surgery is over and you are in a regular room).       Your procedure is scheduled on:  08/18/2023    Report to [facility] Main Entrance    Report to admitting at   [phone]   Call this number if you have problems the morning of surgery [phone]   Do not eat food :After Midnight.   After Midnight you may have the following liquids until _ 0615_____ AM/ PM DAY OF SURGERY  Water  Non-Citrus Juices (without pulp, NO RED-Apple, White grape, White cranberry) Black Coffee (NO MILK/CREAM OR CREAMERS, sugar ok)  Clear Tea (NO MILK/CREAM OR CREAMERS, sugar ok) regular and decaf                             Plain Jell-O (NO RED)                                           Fruit ices (not with fruit pulp, NO RED)                                     Popsicles (NO RED)                                                               Sports drinks like Gatorade (NO RED)                    The day of surgery:  Drink ONE (1) Pre-Surgery Clear Ensure or G2 at 0615 AM  ( have completed by )  pre-op appointment visit. Nothing else to drink after completing the  Pre-Surgery Clear Ensure or G2.          If you have questions, please contact your surgeon's office.   FOLLOW BOWEL PREP AND ANY  ADDITIONAL PRE OP INSTRUCTIONS YOU RECEIVED FROM YOUR SURGEON'S OFFICE!!!     Oral Hygiene is also important to reduce your risk of infection.                                    Remember - BRUSH YOUR TEETH THE MORNING OF SURGERY WITH YOUR REGULAR TOOTHPASTE  DENTURES WILL BE REMOVED PRIOR TO SURGERY PLEASE DO NOT APPLY "Poly grip" OR  ADHESIVES!!!   Do NOT smoke after Midnight   Stop all vitamins and herbal supplements 7 days before surgery.   Take these medicines the morning of surgery with A SIP OF WATER :  coreg , protonix                Metformin - none am of surgery   DO NOT TAKE ANY ORAL DIABETIC MEDICATIONS DAY OF YOUR SURGERY  Bring CPAP mask and tubing day of surgery.                              You may not have any metal on your body including hair pins, jewelry, and body piercing             Do not wear make-up, lotions, powders, perfumes/cologne, or deodorant  Do not wear nail polish including gel and S&S, artificial/acrylic nails, or any other type of covering on natural nails including finger and toenails. If you have artificial nails, gel coating, etc. that needs to be removed by a nail salon please have this removed prior to surgery or surgery may need to be canceled/ delayed if the surgeon/ anesthesia feels like they are unable to be safely monitored.   Do not shave  48 hours prior to surgery.               Men may shave face and neck.   Do not bring valuables to the hospital. [facility] IS NOT             RESPONSIBLE   FOR VALUABLES.   Contacts, glasses, dentures or bridgework may not be worn into surgery.   Bring small overnight bag day of surgery.   DO NOT BRING YOUR HOME MEDICATIONS TO THE HOSPITAL. PHARMACY WILL DISPENSE MEDICATIONS LISTED ON YOUR MEDICATION LIST TO YOU DURING YOUR ADMISSION IN THE HOSPITAL!    Patients discharged on the day of surgery will not be allowed to drive home.  Someone NEEDS to stay with you for the first 24 hours after  anesthesia.   Special Instructions: Bring a copy of your healthcare power of attorney and living will documents the day of surgery if you haven't scanned them before.              Please read over the following fact sheets you were given: IF YOU HAVE QUESTIONS ABOUT YOUR PRE-OP INSTRUCTIONS PLEASE CALL [phone]   If you received a COVID test during your pre-op visit  it is requested that you wear a mask when out in public, stay away from anyone that may not be feeling well and notify your surgeon if you develop symptoms. If you test positive for Covid or have been in contact with anyone that has tested positive in the last 10 days please notify you surgeon.      Pre-operative 5 CHG Bath Instructions   You can play a key role in reducing the risk of infection after surgery. Your skin needs to be as free of germs as possible. You can reduce the number of germs on your skin by washing with CHG (chlorhexidine gluconate) soap before surgery. CHG is an antiseptic soap that kills germs and continues to kill germs even after washing.   DO NOT use if you have an allergy to chlorhexidine/CHG or antibacterial soaps. If your skin becomes reddened or irritated, stop using the CHG and notify one of our RNs at [phone].   Please shower with the CHG  soap starting 4 days before surgery using the following schedule:     Please keep in mind the following:  DO NOT shave, including legs and underarms, starting the day of your first shower.   You may shave your face at any point before/day of surgery.  Place clean sheets on your bed the day you start using CHG soap. Use a clean washcloth (not used since being washed) for each shower. DO NOT sleep with pets once you start using the CHG.   CHG Shower Instructions:  If you choose to wash your hair and private area, wash first with your normal shampoo/soap.  After you use shampoo/soap, rinse your hair and body thoroughly to remove shampoo/soap residue.   Turn the water  OFF and apply about 3 tablespoons (45 ml) of CHG soap to a CLEAN washcloth.  Apply CHG soap ONLY FROM YOUR NECK DOWN TO YOUR TOES (washing for 3-5 minutes)  DO NOT use CHG soap on face, private areas, open wounds, or sores.  Pay special attention to the area where your surgery is being performed.  If you are having back surgery, having someone wash your back for you may be helpful. Wait 2 minutes after CHG soap is applied, then you may rinse off the CHG soap.  Pat dry with a clean towel  Put on clean clothes/pajamas   If you choose to wear lotion, please use ONLY the CHG-compatible lotions on the back of this paper.     Additional instructions for the day of surgery: DO NOT APPLY any lotions, deodorants, cologne, or perfumes.   Put on clean/comfortable clothes.  Brush your teeth.  Ask your nurse before applying any prescription medications to the skin.      CHG Compatible Lotions   Aveeno Moisturizing lotion  Cetaphil Moisturizing Cream  Cetaphil Moisturizing Lotion  Clairol Herbal Essence Moisturizing Lotion, Dry Skin  Clairol Herbal Essence Moisturizing Lotion, Extra Dry Skin  Clairol Herbal Essence Moisturizing Lotion, Normal Skin  Curel Age Defying Therapeutic Moisturizing Lotion with Alpha Hydroxy  Curel Extreme Care Body Lotion  Curel Soothing Hands Moisturizing Hand Lotion  Curel Therapeutic Moisturizing Cream, Fragrance-Free  Curel Therapeutic Moisturizing Lotion, Fragrance-Free  Curel Therapeutic Moisturizing Lotion, Original Formula  Eucerin Daily Replenishing Lotion  Eucerin Dry Skin Therapy Plus Alpha Hydroxy Crme  Eucerin Dry Skin Therapy Plus Alpha Hydroxy Lotion  Eucerin Original Crme  Eucerin Original Lotion  Eucerin Plus Crme Eucerin Plus Lotion  Eucerin TriLipid Replenishing Lotion  Keri Anti-Bacterial Hand Lotion  Keri Deep Conditioning Original Lotion Dry Skin Formula Softly Scented  Keri Deep Conditioning Original Lotion, Fragrance  Free Sensitive Skin Formula  Keri Lotion Fast Absorbing Fragrance Free Sensitive Skin Formula  Keri Lotion Fast Absorbing Softly Scented Dry Skin Formula  Keri Original Lotion  Keri Skin Renewal Lotion Keri Silky Smooth Lotion  Keri Silky Smooth Sensitive Skin Lotion  Nivea Body Creamy Conditioning Oil  Nivea Body Extra Enriched Teacher, adult education Moisturizing Lotion Nivea Crme  Nivea Skin Firming Lotion  NutraDerm 30 Skin Lotion  NutraDerm Skin Lotion  NutraDerm Therapeutic Skin Cream  NutraDerm Therapeutic Skin Lotion  ProShield Protective Hand Cream  Provon moisturizing lotion

## 2023-08-05 ENCOUNTER — Other Ambulatory Visit: Payer: Self-pay

## 2023-08-05 ENCOUNTER — Encounter (HOSPITAL_COMMUNITY): Payer: Self-pay

## 2023-08-05 ENCOUNTER — Other Ambulatory Visit (HOSPITAL_COMMUNITY): Payer: Self-pay

## 2023-08-05 ENCOUNTER — Ambulatory Visit (HOSPITAL_COMMUNITY)
Admission: RE | Admit: 2023-08-05 | Discharge: 2023-08-05 | Disposition: A | Discharge status: Home / Self Care | Source: Ambulatory Visit | Attending: Orthopedic Surgery | Admitting: Orthopedic Surgery

## 2023-08-05 ENCOUNTER — Encounter (HOSPITAL_COMMUNITY)
Admission: RE | Admit: 2023-08-05 | Discharge: 2023-08-05 | Disposition: A | Discharge status: Home / Self Care | Source: Ambulatory Visit | Attending: Orthopedic Surgery | Admitting: Orthopedic Surgery

## 2023-08-05 VITALS — BP 114/66 | HR 52 | Temp 98.3°F | Resp 16 | Ht 65.0 in

## 2023-08-05 DIAGNOSIS — I10 Essential (primary) hypertension: Secondary | ICD-10-CM | POA: Insufficient documentation

## 2023-08-05 DIAGNOSIS — I2510 Atherosclerotic heart disease of native coronary artery without angina pectoris: Secondary | ICD-10-CM | POA: Diagnosis not present

## 2023-08-05 DIAGNOSIS — G4733 Obstructive sleep apnea (adult) (pediatric): Secondary | ICD-10-CM | POA: Diagnosis not present

## 2023-08-05 DIAGNOSIS — M1712 Unilateral primary osteoarthritis, left knee: Secondary | ICD-10-CM | POA: Insufficient documentation

## 2023-08-05 DIAGNOSIS — Z01812 Encounter for preprocedural laboratory examination: Secondary | ICD-10-CM | POA: Diagnosis not present

## 2023-08-05 DIAGNOSIS — Z01818 Encounter for other preprocedural examination: Secondary | ICD-10-CM | POA: Diagnosis not present

## 2023-08-05 DIAGNOSIS — E119 Type 2 diabetes mellitus without complications: Secondary | ICD-10-CM | POA: Insufficient documentation

## 2023-08-05 DIAGNOSIS — Z87891 Personal history of nicotine dependence: Secondary | ICD-10-CM | POA: Diagnosis not present

## 2023-08-05 DIAGNOSIS — M129 Arthropathy, unspecified: Secondary | ICD-10-CM | POA: Diagnosis not present

## 2023-08-05 LAB — BASIC METABOLIC PANEL WITH GFR
Anion gap: 8 (ref 5–15)
BUN: 7 mg/dL — ABNORMAL LOW (ref 8–23)
CO2: 25 mmol/L (ref 22–32)
Calcium: 9.3 mg/dL (ref 8.9–10.3)
Chloride: 103 mmol/L (ref 98–111)
Creatinine, Ser: 0.76 mg/dL (ref 0.61–1.24)
GFR, Estimated: >60 mL/min (ref >60)
Glucose, Bld: 95 mg/dL (ref 70–99)
Potassium: 3.8 mmol/L (ref 3.5–5.1)
Sodium: 136 mmol/L (ref 135–145)

## 2023-08-05 LAB — CBC
HCT: 42 % (ref 39.0–52.0)
Hemoglobin: 14 g/dL (ref 13.0–17.0)
MCH: 30.9 pg (ref 26.0–34.0)
MCHC: 33.3 g/dL (ref 30.0–36.0)
MCV: 92.7 fL (ref 80.0–100.0)
Platelets: 231 10*3/uL (ref 150–400)
RBC: 4.53 MIL/uL (ref 4.22–5.81)
RDW: 13.8 % (ref 11.5–15.5)
WBC: 6 10*3/uL (ref 4.0–10.5)
nRBC: 0 % (ref 0.0–0.2)

## 2023-08-05 LAB — GLUCOSE, CAPILLARY: Glucose-Capillary: 94 mg/dL (ref 70–99)

## 2023-08-05 LAB — SURGICAL PCR SCREEN
MRSA, PCR: NEGATIVE
Staphylococcus aureus: NEGATIVE

## 2023-08-06 ENCOUNTER — Other Ambulatory Visit: Payer: Self-pay | Admitting: Cardiovascular Disease

## 2023-08-06 ENCOUNTER — Other Ambulatory Visit: Payer: Self-pay

## 2023-08-06 ENCOUNTER — Other Ambulatory Visit (HOSPITAL_COMMUNITY): Payer: Self-pay

## 2023-08-06 LAB — HEMOGLOBIN A1C
Hgb A1c MFr Bld: 5.6 % (ref 4.8–5.6)
Mean Plasma Glucose: 114 mg/dL

## 2023-08-06 NOTE — Progress Notes (Signed)
 Anesthesia Chart Review   Case: [id number] Date/Time: 08/18/23 0942   Procedure: ARTHROPLASTY, KNEE, TOTAL (Left: Knee)   Anesthesia type: Spinal   Pre-op diagnosis: LEFT KNEE DEGENERATIVE JOINT DISEASE   Location: [facility] 08 / [facility]   Surgeons: Neil Balls, MD       DISCUSSION:78 y.o. former smoker with h/o HTN, OSA not using cpap currently, CAD s/p DES 2009, DM II, prostate cancer, left knee djd scheduled for above procedure 08/18/2023 with Dr. Neil Balls.   Pt last seen by cardiology 07/03/2023. Per OV note, Cardiology wise he has been stable and has been without anginal symptomatology, shortness of breath, palpitations or arrhythmia. Blood pressure today is stable and on repeat by me was 112/66. ECG today shows sinus rhythm with bradycardia at 53 bpm. Presently, he is stable.  VS: BP 114/66   Pulse (!) 52   Temp 36.8 C (Oral)   Resp 16   Ht 5' 5 (1.651 m)   SpO2 100%   BMI 44.43 kg/m   PROVIDERS: Wilburn Handler, MD is PCP    LABS: Labs reviewed: Acceptable for surgery. (all labs ordered are listed, but only abnormal results are displayed)  Labs Reviewed  BASIC METABOLIC PANEL WITH GFR - Abnormal; Notable for the following components:      Result Value   BUN 7 (*)    All other components within normal limits  SURGICAL PCR SCREEN  CBC  HEMOGLOBIN A1C  GLUCOSE, CAPILLARY     IMAGES:   EKG:   CV: Myocardial Perfusion 01/14/2018  The left ventricular ejection fraction is mildly decreased (45-54%). Nuclear stress EF: 49%. There was no ST segment deviation noted during stress. This is a low risk study.   1. EF 49%, mild diffuse hypokinesis.  2. Fixed small, mild apical inferior perfusion defect.  Attenuation versus small area of prior infarction.    Low risk study.   Echo 05/11/2015 - Left ventricle: The cavity size was normal. Wall thickness was    normal. The estimated ejection fraction was 55%. Wall motion was    normal; there were no regional wall  motion abnormalities.    Features are consistent with a pseudonormal left ventricular    filling pattern, with concomitant abnormal relaxation and    increased filling pressure (grade 2 diastolic dysfunction).  - Aortic valve: There was no stenosis.  - Mitral valve: Mildly calcified annulus. There was no significant    regurgitation.  - Right ventricle: The cavity size was normal. Systolic function    was normal.  - Pulmonary arteries: PA peak pressure: 15 mm Hg (S).  - Inferior vena cava: The vessel was normal in size. The    respirophasic diameter changes were in the normal range (= 50%),    consistent with normal central venous pressure.  Past Medical History:  Diagnosis Date   Arthritis    all over oa   CAD (coronary artery disease)    Cancer (HCC)    Prostate   Colon polyps 01/23/2010   Descending   Diverticulosis    dm type 2    Eczema 09/05/2020   Gastric ulcer 2010   GI bleed 2010   Hyperlipidemia    Hypertension    Hypotension    transient   Neuropathy 09/05/2020   both hands and feet   OSA (obstructive sleep apnea)    Severe, on CPAP therapy- no longer uses cpap per pt   Prostate cancer (HCC)    Wears glasses 09/05/2020  Past Surgical History:  Procedure Laterality Date   CARDIAC CATHETERIZATION  05/01/2007   Recommend rotational atherectomy followed by PTCA and probable stenting of LAD.   CARDIAC CATHETERIZATION  05/28/2007   Mid LAD hihgh grade 80-90% stenosis, stented with a 3x24mm Cypher stent implanted at 16atm, postdilated with a 3.25x19mm Quantum at 16atm, which revealed excellent wall appoistion - 5.27mm   CARDIOVASCULAR STRESS TEST  02/05/2011   Perfusion defect seen in inferior myocardial region consistent with diagphragmatic attenuation. Remaining myocardium demonstrates normal myocardial perfusion with no evidence of ischemia or infarct. ECG is positive for ischemia.   colonscopy  2012   CPAP/BIPAP SLEEP STUDY  07/01/2007   AHI-0.5/hr at 11cm  water  pressure, RDI-12.6/hr   CYSTOSCOPY  09/08/2020   Procedure: CYSTOSCOPY FLEXIBLE;  Surgeon: Homero Luster, MD;  Location: [facility];  Service: Urology;;  NO SEEDS FOUND IN BLADDER   LOWER ARTERIAL DOPPLER  06/03/2007   No evidence of dissection, AV fistula, or active pseudoaneurysm.   NASAL ENDOSCOPY  12/2008   RADIOACTIVE SEED IMPLANT N/A 09/08/2020   Procedure: RADIOACTIVE SEED IMPLANT/BRACHYTHERAPY IMPLANT;  Surgeon: Homero Luster, MD;  Location: [facility];  Service: Urology;  Laterality: N/A;  51 SEEDS IMPLANTED   SLEEP STUDY  05/13/2007   AHI-11.52/hr, AHI REM-56.0/hr, RDI-11.7/hr, RDI REM-56.0/hr, avg oxygen sat-94%   SPACE OAR INSTILLATION N/A 09/08/2020   Procedure: SPACE OAR INSTILLATION;  Surgeon: Homero Luster, MD;  Location: [facility];  Service: Urology;  Laterality: N/A;   stent to heart x 1  2010   TRANSTHORACIC ECHOCARDIOGRAM  03/13/2010   EF >55%, normal diastolic function, mild MR, mild TR, and normal RVSP    MEDICATIONS:  ACCU-CHEK GUIDE test strip   acetaminophen  (TYLENOL ) 650 MG CR tablet   Apoaequorin (PREVAGEN EXTRA STRENGTH) 20 MG CAPS   Ascorbic Acid (VITAMIN C) 1000 MG tablet   aspirin 81 MG tablet   Calcium  Carb-Cholecalciferol (CALCIUM  600 + D PO)   carvedilol  (COREG ) 3.125 MG tablet   Cholecalciferol (VITAMIN D3 MAXIMUM STRENGTH) 125 MCG (5000 UT) capsule   CINNAMON PO   Coenzyme Q10 (COQ10) 100 MG CAPS   ezetimibe  (ZETIA ) 10 MG tablet   glucose blood test strip   Iron-Vitamins (GERITOL) LIQD   ketoconazole (NIZORAL) 2 % cream   lisinopril  (ZESTRIL ) 5 MG tablet   meclizine (ANTIVERT) 25 MG tablet   metFORMIN  (GLUCOPHAGE ) 500 MG tablet   OVER THE COUNTER MEDICATION   pantoprazole  (PROTONIX ) 40 MG tablet   polyethylene glycol powder (GLYCOLAX/MIRALAX) 17 GM/SCOOP powder   Psyllium (METAMUCIL PO)   simvastatin  (ZOCOR ) 40 MG tablet   tamsulosin  (FLOMAX ) 0.4 MG CAPS capsule   tirzepatide  (MOUNJARO ) 5  MG/0.5ML Pen   tirzepatide  (MOUNJARO ) 7.5 MG/0.5ML Pen   TRUEplus Lancets 33G MISC   Turmeric 500 MG CAPS   No current facility-administered medications for this encounter.     Chick Cotton Ward, PA-C [facility] [phone]

## 2023-08-07 ENCOUNTER — Other Ambulatory Visit (HOSPITAL_COMMUNITY): Payer: Self-pay

## 2023-08-07 DIAGNOSIS — M25562 Pain in left knee: Secondary | ICD-10-CM | POA: Diagnosis not present

## 2023-08-07 MED ORDER — SIMVASTATIN 40 MG PO TABS
40.0000 mg | ORAL_TABLET | Freq: Every day | ORAL | 3 refills | Status: AC
  Filled 2023-08-07 – 2023-11-07 (×2): qty 90, 90d supply, fill #0
  Filled 2024-02-02: qty 90, 90d supply, fill #1

## 2023-08-07 MED ORDER — LISINOPRIL 5 MG PO TABS
5.0000 mg | ORAL_TABLET | Freq: Every day | ORAL | 3 refills | Status: AC
  Filled 2023-08-07: qty 90, 90d supply, fill #0

## 2023-08-08 ENCOUNTER — Other Ambulatory Visit: Payer: Self-pay

## 2023-08-08 ENCOUNTER — Other Ambulatory Visit (HOSPITAL_COMMUNITY): Payer: Self-pay

## 2023-08-13 NOTE — Care Plan (Signed)
 Ortho Bundle Case Management Note  Patient Details  Name: Jordan [name]. MRN: [id number] Date of Birth: 1945/05/27   met with patient, wife and daughter in the office for H&P. will discharge to home with family to assist. has DME. OPPT set up with [facility]. discharge instructions discussed and questions answered. Patient and MD in agreement. Choice offered                     DME Arranged:    DME Agency:     HH Arranged:    HH Agency:     Additional Comments: Please contact me with any questions of if this plan should need to change.  Cornelia Dieter,  RN,BSN,MHA,CCM  [facility]  [phone] 08/13/2023, 3:26 PM

## 2023-08-15 IMAGING — CT CT ABD-PEL WO/W CM
4 of 12 series · 12 of 46 positions shown, 18 images · IV contrast (OMNIPAQUE)
Comparison: 06/01/2020

CLINICAL DATA: Intermittent hematuria, history of prostate cancer

EXAM:
CT ABDOMEN AND PELVIS WITHOUT AND WITH CONTRAST
TECHNIQUE: Multidetector CT imaging of the abdomen and pelvis was performed
following the standard protocol before and following the bolus
administration of intravenous contrast.
CONTRAST:  100mL OMNIPAQUE IOHEXOL 350 MG/ML SOLN

[Series 2: axial pre · axial · non-contrast · 0.84mm/px · z∈[-430,-195]mm · 3 of 95 slices shown]
[im 24/95  soft-tissue]
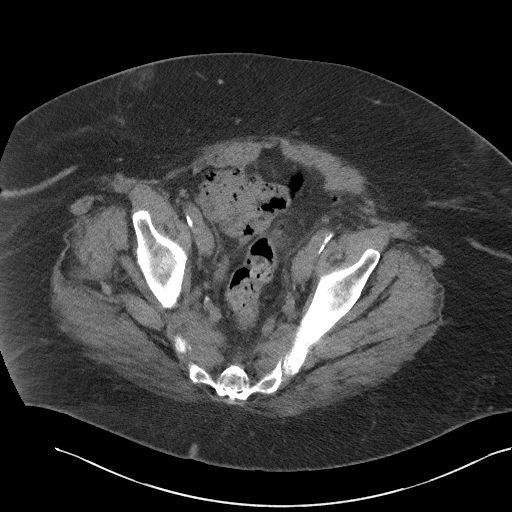
[im 48/95  soft-tissue]
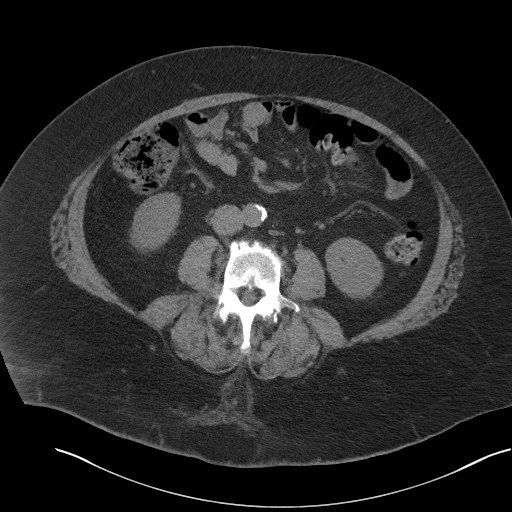
[im 71/95  soft-tissue]
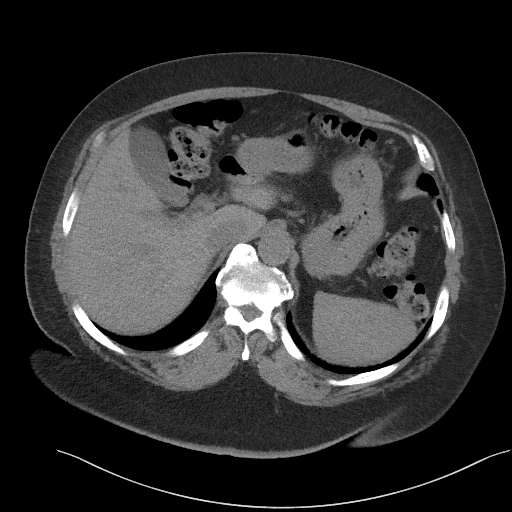

[Series 4: axial post · axial · 0.84mm/px · z∈[-430,-195]mm · 3 of 95 slices shown]
[im 24/95  soft-tissue]
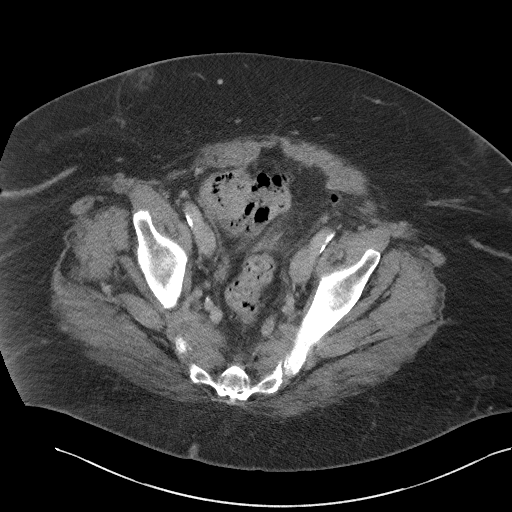
[im 48/95  soft-tissue]
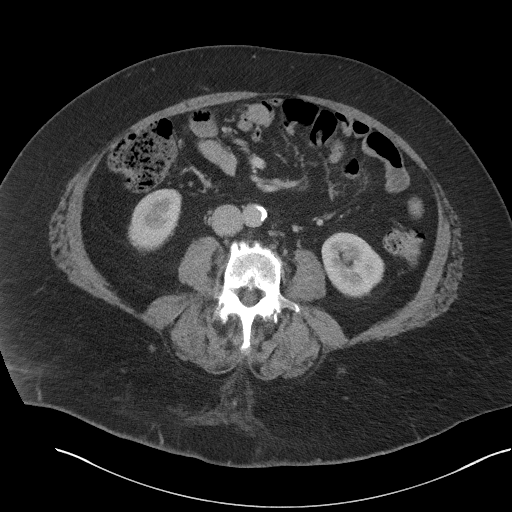
[im 71/95  soft-tissue]
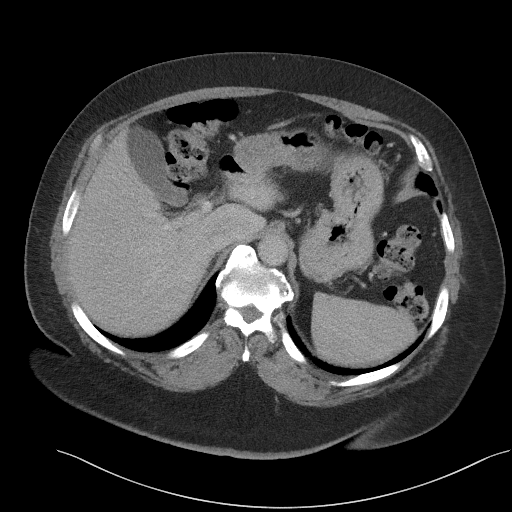

[Series 8: axial delay · axial · delayed · 0.86mm/px · z∈[-375,-90]mm · 4 of 96 slices shown, 9 images]
[im 20/96  soft-tissue]
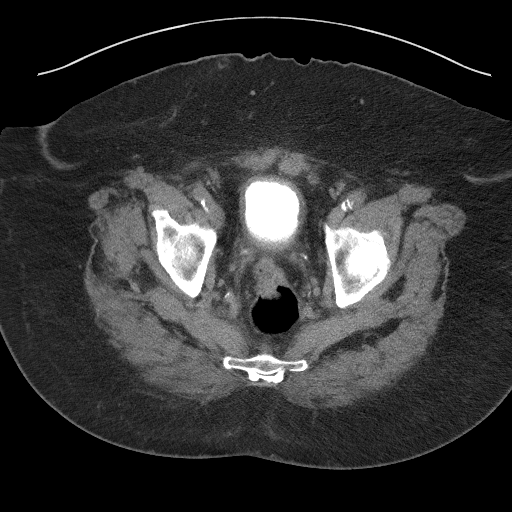
[im 20/96  lung]
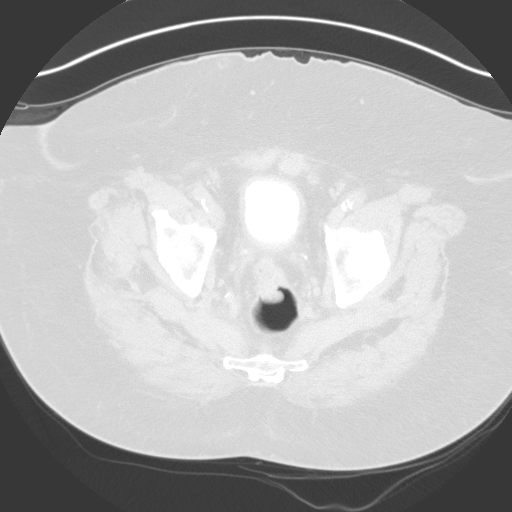
[im 20/96  bone]
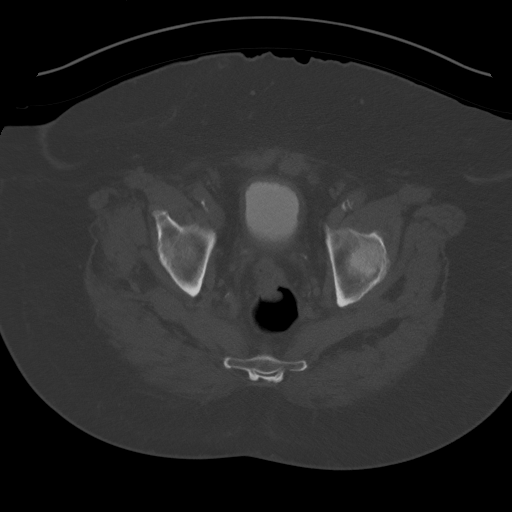
[im 39/96  soft-tissue]
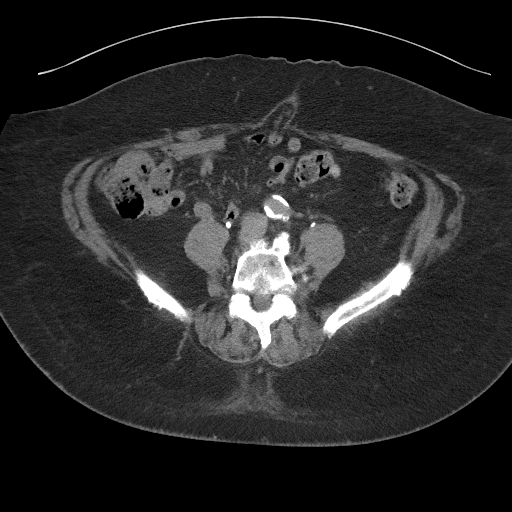
[im 39/96  lung]
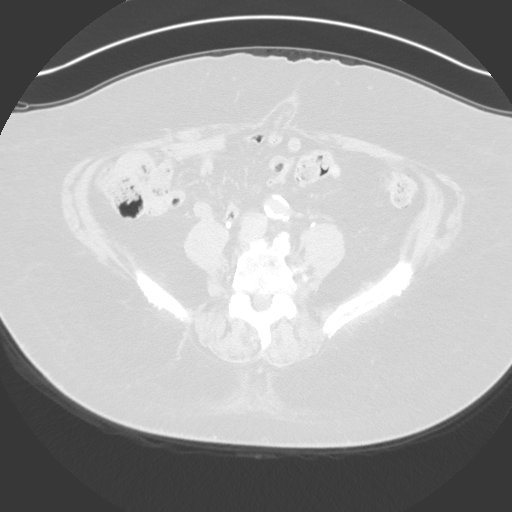
[im 58/96  soft-tissue]
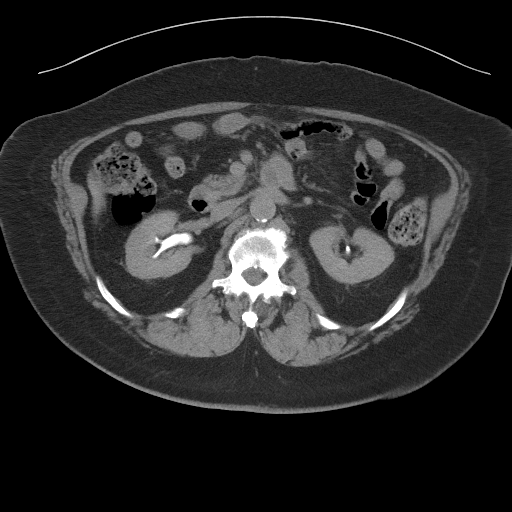
[im 58/96  lung]
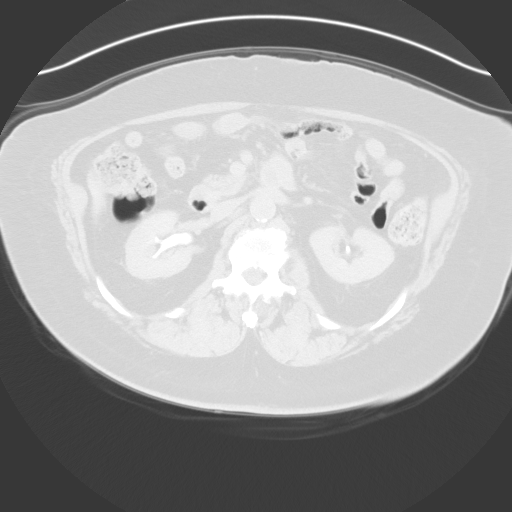
[im 77/96  soft-tissue]
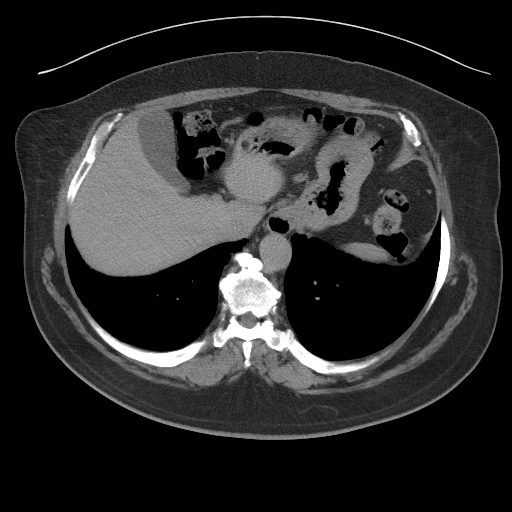
[im 77/96  lung]
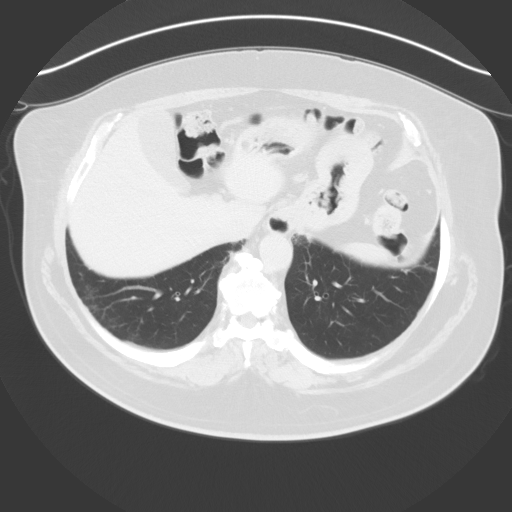

[Series 10: coronal pre · coronal · non-contrast · 0.94mm/px · 2 of 112 slices shown, 3 images]
[im 38/112  soft-tissue]
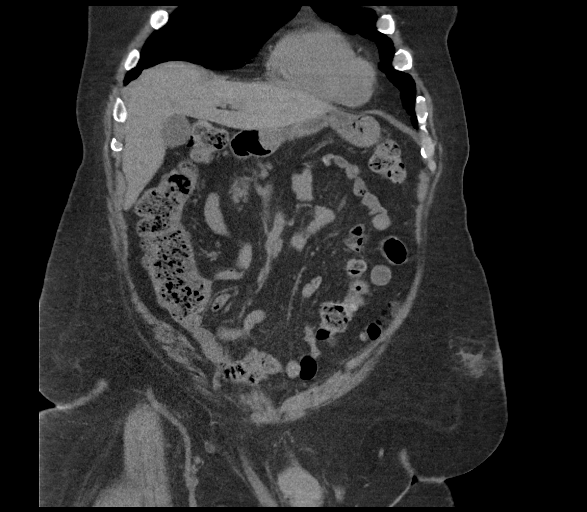
[im 38/112  bone]
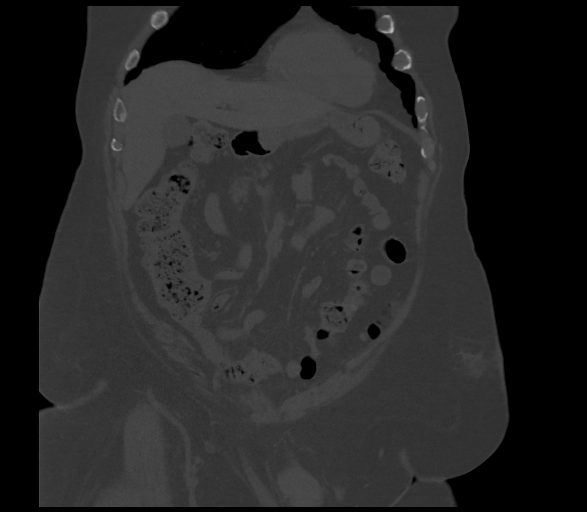
[im 75/112  soft-tissue]
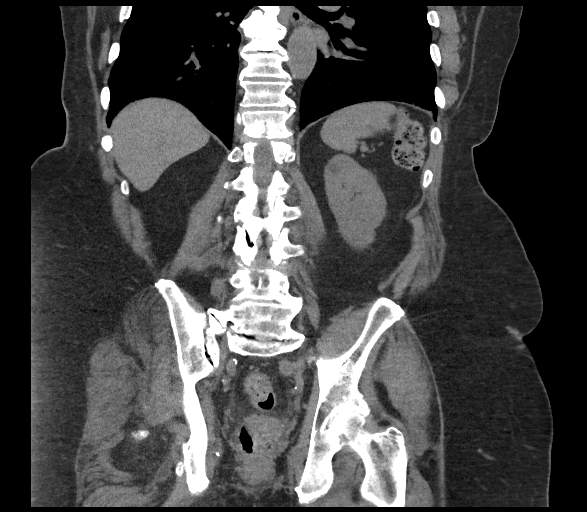

[12 of 46 positions shown; findings below may reference images not displayed]

FINDINGS: Lower chest: No acute abnormality. Three-vessel coronary artery
calcifications and stents. Small hiatal hernia.

Hepatobiliary: No solid liver abnormality is seen. No gallstones,
gallbladder wall thickening, or biliary dilatation.

Pancreas: Unremarkable. No pancreatic ductal dilatation or
surrounding inflammatory changes.

Spleen: Normal in size without significant abnormality.

Adrenals/Urinary Tract: Adrenal glands are unremarkable. Multiple
small bilateral nonobstructive renal calculi. No ureteral calculi or
hydronephrosis. Thickening of the decompressed urinary bladder.

Stomach/Bowel: Stomach is within normal limits. Appendix is not
clearly visualized. No evidence of bowel wall thickening,
distention, or inflammatory changes. Descending and sigmoid
diverticulosis.

Vascular/Lymphatic: Aortic atherosclerosis. No enlarged abdominal or
pelvic lymph nodes.

Reproductive: Prostate brachytherapy pellets.

Other: Small umbilical hernia.  No abdominopelvic ascites.

Musculoskeletal: No acute or significant osseous findings.
IMPRESSION: 1. Multiple small bilateral nonobstructive renal calculi. No
ureteral calculi or hydronephrosis.
2. No mass, suspicious contrast enhancement, or urinary tract
filling defect on delayed phase imaging.
3. Thickening of the decompressed urinary bladder, likely related to
chronic outlet obstruction.
4. Prostate brachytherapy pellets.
5. Coronary artery disease.

Aortic Atherosclerosis ([id number]).

## 2023-08-15 NOTE — Anesthesia Preprocedure Evaluation (Signed)
 Anesthesia Evaluation    Reviewed: Allergy & Precautions, H&P , Patient's Chart, lab work & pertinent test results  Airway        Dental   Pulmonary neg pulmonary ROS, sleep apnea , former smoker          Cardiovascular Exercise Tolerance: Good hypertension, Pt. on medications and Pt. on home beta blockers negative cardio ROS      Neuro/Psych negative neurological ROS  negative psych ROS   GI/Hepatic negative GI ROS, Neg liver ROS, PUD,GERD  Medicated,,  Endo/Other  negative endocrine ROSdiabetes, Type 2, Oral Hypoglycemic Agents    Renal/GU negative Renal ROS  negative genitourinary   Musculoskeletal  (+) Arthritis , Osteoarthritis,    Abdominal   Peds  Hematology negative hematology ROS (+)   Anesthesia Other Findings   Reproductive/Obstetrics negative OB ROS                             Anesthesia Physical Anesthesia Plan  ASA: 3  Anesthesia Plan: Spinal   Post-op Pain Management: Regional block* and Ofirmev  IV (intra-op)*   Induction: Intravenous  PONV Risk Score and Plan: 2 and Ondansetron  and Propofol  infusion  Airway Management Planned: Natural Airway and Simple Face Mask  Additional Equipment:   Intra-op Plan:   Post-operative Plan:   Informed Consent: I have reviewed the patients History and Physical, chart, labs and discussed the procedure including the risks, benefits and alternatives for the proposed anesthesia with the patient or authorized representative who has indicated his/her understanding and acceptance.       Plan Discussed with:   Anesthesia Plan Comments:        Anesthesia Quick Evaluation

## 2023-08-17 NOTE — H&P (Signed)
 TOTAL KNEE ADMISSION H&P  Patient is being admitted for left total knee arthroplasty.  Subjective:  Chief Complaint:left knee pain.  HPI: Jordan Sattar., 78 y.o. male, has a history of pain and functional disability in the left knee due to arthritis and has failed non-surgical conservative treatments for greater than 12 weeks to includeNSAID's and/or analgesics, corticosteriod injections, weight reduction as appropriate, and activity modification.  Onset of symptoms was gradual, starting 2 years ago with gradually worsening course since that time. The patient noted no past surgery on the left knee(s).  Patient currently rates pain in the left knee(s) at 8 out of 10 with activity. Patient has night pain, worsening of pain with activity and weight bearing, pain that interferes with activities of daily living, pain with passive range of motion, and joint swelling.  Patient has evidence of subchondral sclerosis, periarticular osteophytes, and joint space narrowing by imaging studies.. There is no active infection.  Patient Active Problem List   Diagnosis Date Noted   Osteoarthritis of left knee 09/12/2021   Pain in joint of left knee 09/07/2021   Elevated PSA 11/30/2020   Malignant neoplasm of prostate (HCC) 06/21/2020   Bilateral hand pain 06/17/2016   Neck pain 06/17/2016   Type 2 diabetes mellitus with other circulatory complications (HCC) 04/20/2015   History of gastric ulcer 05/23/2014   OSA on CPAP 03/04/2013   Morbid obesity (HCC) 03/04/2013   CAD (coronary artery disease) 03/04/2013   Hyperlipidemia with target LDL less than 70 03/04/2013   Type 2 diabetes mellitus (HCC) 03/04/2013   GERD (gastroesophageal reflux disease) 03/04/2013   Vertigo 03/04/2013   Acute gastric ulcer 03/28/2009   Past Medical History:  Diagnosis Date   Arthritis    all over oa   CAD (coronary artery disease)    Cancer (HCC)    Prostate   Colon polyps 01/23/2010   Descending   Diverticulosis    dm  type 2    Eczema 09/05/2020   Gastric ulcer 2010   GI bleed 2010   Hyperlipidemia    Hypertension    Hypotension    transient   Neuropathy 09/05/2020   both hands and feet   OSA (obstructive sleep apnea)    Severe, on CPAP therapy- no longer uses cpap per pt   Prostate cancer (HCC)    Wears glasses 09/05/2020    Past Surgical History:  Procedure Laterality Date   CARDIAC CATHETERIZATION  05/01/2007   Recommend rotational atherectomy followed by PTCA and probable stenting of LAD.   CARDIAC CATHETERIZATION  05/28/2007   Mid LAD hihgh grade 80-90% stenosis, stented with a 3x29mm Cypher stent implanted at 16atm, postdilated with a 3.25x78mm Quantum at 16atm, which revealed excellent wall appoistion - 5.42mm   CARDIOVASCULAR STRESS TEST  02/05/2011   Perfusion defect seen in inferior myocardial region consistent with diagphragmatic attenuation. Remaining myocardium demonstrates normal myocardial perfusion with no evidence of ischemia or infarct. ECG is positive for ischemia.   colonscopy  2012   CPAP/BIPAP SLEEP STUDY  07/01/2007   AHI-0.5/hr at 11cm water  pressure, RDI-12.6/hr   CYSTOSCOPY  09/08/2020   Procedure: CYSTOSCOPY FLEXIBLE;  Surgeon: Watt Rush, MD;  Location: [facility];  Service: Urology;;  NO SEEDS FOUND IN BLADDER   LOWER ARTERIAL DOPPLER  06/03/2007   No evidence of dissection, AV fistula, or active pseudoaneurysm.   NASAL ENDOSCOPY  12/2008   RADIOACTIVE SEED IMPLANT N/A 09/08/2020   Procedure: RADIOACTIVE SEED IMPLANT/BRACHYTHERAPY IMPLANT;  Surgeon: Watt,  Norleen, MD;  Location: [facility];  Service: Urology;  Laterality: N/A;  51 SEEDS IMPLANTED   SLEEP STUDY  05/13/2007   AHI-11.52/hr, AHI REM-56.0/hr, RDI-11.7/hr, RDI REM-56.0/hr, avg oxygen sat-94%   SPACE OAR INSTILLATION N/A 09/08/2020   Procedure: SPACE OAR INSTILLATION;  Surgeon: Watt Norleen, MD;  Location: [facility];  Service: Urology;  Laterality: N/A;   stent  to heart x 1  2010   TRANSTHORACIC ECHOCARDIOGRAM  03/13/2010   EF >55%, normal diastolic function, mild MR, mild TR, and normal RVSP    No current facility-administered medications for this encounter.   Current Outpatient Medications  Medication Sig Dispense Refill Last Dose/Taking   acetaminophen  (TYLENOL ) 650 MG CR tablet Take 650-1,300 mg by mouth every 8 (eight) hours as needed. Pt takes 2 tablets in the morning and 2 tablets in the evening   Taking As Needed   Apoaequorin (PREVAGEN EXTRA STRENGTH) 20 MG CAPS Take 20 mg by mouth every evening.   Taking   Ascorbic Acid (VITAMIN C) 1000 MG tablet Take 1,000 mg by mouth 2 (two) times daily.   Taking   aspirin 81 MG tablet Take 81 mg by mouth at bedtime.   Taking   Calcium  Carb-Cholecalciferol (CALCIUM  600 + D PO) Take 2 tablets by mouth every evening.   Taking   carvedilol  (COREG ) 3.125 MG tablet Take 1 tablet (3.125 mg total) by mouth 2 (two) times daily with a meal. (Patient taking differently: Take 3.125 mg by mouth daily.) 90 tablet 0 Taking Differently   Cholecalciferol (VITAMIN D3 MAXIMUM STRENGTH) 125 MCG (5000 UT) capsule Take 10,000 Units by mouth daily.   Taking   CINNAMON PO Take 1 tablet by mouth 2 (two) times daily.   Taking   Coenzyme Q10 (COQ10) 100 MG CAPS Take 100 mg by mouth daily.   Taking   ezetimibe  (ZETIA ) 10 MG tablet Take 1 tablet (10 mg total) by mouth daily. 90 tablet 0 Taking   Iron-Vitamins (GERITOL) LIQD Take 1 Dose by mouth every evening.   Taking   ketoconazole (NIZORAL) 2 % cream Apply 1 application  topically daily as needed (eczema).   Taking As Needed   meclizine (ANTIVERT) 25 MG tablet Take 25 mg by mouth daily.   Taking   metFORMIN  (GLUCOPHAGE ) 500 MG tablet Take 500 mg by mouth daily with breakfast.   Taking   OVER THE COUNTER MEDICATION Take 2 capsules by mouth daily. Hemp Gummies   Taking   pantoprazole  (PROTONIX ) 40 MG tablet Take 1 tablet (40 mg total) by mouth daily. 90 tablet 3 Taking    polyethylene glycol powder (GLYCOLAX/MIRALAX) 17 GM/SCOOP powder Take 17 g by mouth every evening.   Taking   Psyllium (METAMUCIL PO) Take 1 Dose by mouth every evening. 1 dose = 1 heaping tablespoon   Taking   tamsulosin  (FLOMAX ) 0.4 MG CAPS capsule Take 1 capsule (0.4 mg total) by mouth daily after supper. 90 capsule 3 Taking   tirzepatide  (MOUNJARO ) 7.5 MG/0.5ML Pen Inject 7.5 mg into the skin once a week. 9 mL 1 Taking   Turmeric 500 MG CAPS Take 1,000 mg by mouth daily.   Taking   ACCU-CHEK GUIDE test strip TEST AS DIRECTED ONCEODAILY.M      glucose blood test strip Test blood sugar 2 (two) times daily. 200 each 2    lisinopril  (ZESTRIL ) 5 MG tablet Take 1 tablet (5 mg total) by mouth daily. 90 tablet 3    simvastatin  (ZOCOR ) 40  MG tablet Take 1 tablet (40 mg total) by mouth at bedtime. 90 tablet 3    tirzepatide  (MOUNJARO ) 5 MG/0.5ML Pen Inject 5 mg into the skin every 7 (seven) days. (Patient not taking: Reported on 07/29/2023) 9 mL 1 Not Taking   TRUEplus Lancets 33G MISC Test blood sugar 2 (two) times daily. 200 each 2    Allergies  Allergen Reactions   Codeine Itching and Rash   Statins Other (See Comments)    Muscle aches, tolerates simvastatin       Social History   Tobacco Use   Smoking status: Former    Current packs/day: 0.00    Average packs/day: 0.5 packs/day for 16.0 years (8.0 ttl pk-yrs)    Types: Cigarettes    Start date: 01/28/1962    Quit date: 01/28/1978    Years since quitting: 45.5   Smokeless tobacco: Never  Substance Use Topics   Alcohol use: Never    Family History  Problem Relation Age of Onset   Stroke Brother    Hypertension Brother    Alzheimer's disease Maternal Grandmother    Stroke Maternal Grandfather    Heart disease Maternal Grandfather    Stroke Paternal Grandmother    Heart attack Paternal Grandfather    Hypertension Father    Alzheimer's disease Father    Parkinson's disease Father    Alzheimer's disease Mother    Pancreatic cancer  Maternal Uncle    Breast cancer Neg Hx    Prostate cancer Neg Hx    Colon cancer Neg Hx      Review of Systemsneg  Objective: Well-developed well-nourished patient in no acute distress. Alert and oriented x3 HEENT:within normal limits Cardiac: Regular rate and rhythm Pulmonary: Lungs clear to auscultation Abdomen: Soft and nontender.  Normal active bowel sounds  Musculoskeletal: (left knee Tender to palpation. Pain through ROM. Crepitation. No laxity)    Vital signs in last 24 hours:    Labs: Recent Results (from the past 2160 hours)  PSA     Status: None   Collection Time: 06/12/23 10:41 AM  Result Value Ref Range   Prostate Specific Ag, Serum 0.1 0.0 - 4.0 ng/mL    Comment: Roche ECLIA methodology. According to the American Urological Association, Serum PSA should decrease and remain at undetectable levels after radical prostatectomy. The AUA defines biochemical recurrence as an initial PSA value 0.2 ng/mL or greater followed by a subsequent confirmatory PSA value 0.2 ng/mL or greater. Values obtained with different assay methods or kits cannot be used interchangeably. Results cannot be interpreted as absolute evidence of the presence or absence of malignant disease.   Urinalysis, Routine w reflex microscopic     Status: None   Collection Time: 06/26/23 11:19 AM  Result Value Ref Range   Specific Gravity, UA 1.020 1.005 - 1.030   pH, UA 6.0 5.0 - 7.5   Color, UA Yellow Yellow   Appearance Ur Clear Clear   Leukocytes,UA Negative Negative   Protein,UA Negative Negative/Trace   Glucose, UA Negative Negative   Ketones, UA Negative Negative   RBC, UA Negative Negative   Bilirubin, UA Negative Negative   Urobilinogen, Ur 0.2 0.2 - 1.0 mg/dL   Nitrite, UA Negative Negative   Microscopic Examination Comment     Comment: Microscopic not indicated and not performed.  Glucose, capillary     Status: None   Collection Time: 08/05/23  9:40 AM  Result Value Ref Range    Glucose-Capillary 94 70 - 99 mg/dL  Comment: Glucose reference range applies only to samples taken after fasting for at least 8 hours.  CBC per protocol     Status: None   Collection Time: 08/05/23  9:50 AM  Result Value Ref Range   WBC 6.0 4.0 - 10.5 K/uL   RBC 4.53 4.22 - 5.81 MIL/uL   Hemoglobin 14.0 13.0 - 17.0 g/dL   HCT 57.9 60.9 - 47.9 %   MCV 92.7 80.0 - 100.0 fL   MCH 30.9 26.0 - 34.0 pg   MCHC 33.3 30.0 - 36.0 g/dL   RDW 86.1 88.4 - 84.4 %   Platelets 231 150 - 400 K/uL   nRBC 0.0 0.0 - 0.2 %    Comment: Performed at [facility], [address], [place], [place] [address]  Basic metabolic panel per protocol     Status: Abnormal   Collection Time: 08/05/23  9:50 AM  Result Value Ref Range   Sodium 136 135 - 145 mmol/L   Potassium 3.8 3.5 - 5.1 mmol/L   Chloride 103 98 - 111 mmol/L   CO2 25 22 - 32 mmol/L   Glucose, Bld 95 70 - 99 mg/dL    Comment: Glucose reference range applies only to samples taken after fasting for at least 8 hours.   BUN 7 (L) 8 - 23 mg/dL   Creatinine, Ser 9.23 0.61 - 1.24 mg/dL   Calcium  9.3 8.9 - 10.3 mg/dL   GFR, Estimated >39 >39 mL/min    Comment: (NOTE) Calculated using the CKD-EPI Creatinine Equation (2021)    Anion gap 8 5 - 15    Comment: Performed at [facility], [address], [place], [place] [address]  Hemoglobin A1c per protocol     Status: None   Collection Time: 08/05/23  9:50 AM  Result Value Ref Range   Hgb A1c MFr Bld 5.6 4.8 - 5.6 %    Comment: (NOTE)         Prediabetes: 5.7 - 6.4         Diabetes: >6.4         Glycemic control for adults with diabetes: <7.0    Mean Plasma Glucose 114 mg/dL    Comment: (NOTE) Performed At: [facility] [address] [place], [place] [id number] Jennette Shorter MD Ey:[phone]   Surgical pcr screen     Status: None   Collection Time: 08/05/23 11:33 AM   Specimen: Nasal Mucosa; Nasal Swab  Result Value Ref Range   MRSA, PCR  NEGATIVE NEGATIVE   Staphylococcus aureus NEGATIVE NEGATIVE    Comment: (NOTE) The Xpert SA Assay (FDA approved for NASAL specimens in patients 61 years of age and older), is one component of a comprehensive surveillance program. It is not intended to diagnose infection nor to guide or monitor treatment. Performed at [facility], [address] [place], [place] [address]      Estimated body mass index is 44.43 kg/m as calculated from the following:   Height as of 08/05/23: 5' 5 (1.651 m).   Weight as of 07/03/23: 121.1 kg.   Imaging Review Plain radiographs demonstrate severe degenerative joint disease of the left knee(s). The overall alignment isneutral. The bone quality appears to be good for age and reported activity level.      Assessment/Plan:  End stage arthritis, left knee   The patient history, physical examination, clinical judgment of the provider and imaging studies are consistent with end stage degenerative joint disease of the  left knee(s) and total knee arthroplasty is deemed medically necessary. The treatment options including medical management, injection therapy arthroscopy and arthroplasty were discussed at length. The risks and benefits of total knee arthroplasty were presented and reviewed. The risks due to aseptic loosening, infection, stiffness, patella tracking problems, thromboembolic complications and other imponderables were discussed. The patient acknowledged the explanation, agreed to proceed with the plan and consent was signed. Patient is being admitted for inpatient treatment for surgery, pain control, PT, OT, prophylactic antibiotics, VTE prophylaxis, progressive ambulation and ADL's and discharge planning. The patient is planning to be discharged home with home health services

## 2023-08-18 ENCOUNTER — Other Ambulatory Visit: Payer: Self-pay

## 2023-08-18 ENCOUNTER — Observation Stay (HOSPITAL_COMMUNITY)
Admission: RE | Admit: 2023-08-18 | Discharge: 2023-08-21 | Disposition: A | Discharge status: Home / Self Care | Attending: Orthopedic Surgery | Admitting: Orthopedic Surgery

## 2023-08-18 ENCOUNTER — Ambulatory Visit (HOSPITAL_COMMUNITY): Payer: Self-pay | Admitting: Physician Assistant

## 2023-08-18 ENCOUNTER — Encounter (HOSPITAL_COMMUNITY)
Admission: RE | Disposition: A | Discharge status: Home / Self Care | Payer: Self-pay | Source: Home / Self Care | Attending: Orthopedic Surgery

## 2023-08-18 ENCOUNTER — Encounter (HOSPITAL_COMMUNITY): Payer: Self-pay | Admitting: Orthopedic Surgery

## 2023-08-18 ENCOUNTER — Ambulatory Visit (HOSPITAL_BASED_OUTPATIENT_CLINIC_OR_DEPARTMENT_OTHER): Payer: Self-pay | Admitting: Anesthesiology

## 2023-08-18 DIAGNOSIS — Z96652 Presence of left artificial knee joint: Principal | ICD-10-CM

## 2023-08-18 DIAGNOSIS — M1712 Unilateral primary osteoarthritis, left knee: Principal | ICD-10-CM | POA: Insufficient documentation

## 2023-08-18 DIAGNOSIS — E119 Type 2 diabetes mellitus without complications: Secondary | ICD-10-CM | POA: Insufficient documentation

## 2023-08-18 DIAGNOSIS — I2510 Atherosclerotic heart disease of native coronary artery without angina pectoris: Secondary | ICD-10-CM

## 2023-08-18 DIAGNOSIS — I10 Essential (primary) hypertension: Secondary | ICD-10-CM | POA: Diagnosis not present

## 2023-08-18 DIAGNOSIS — Z7984 Long term (current) use of oral hypoglycemic drugs: Secondary | ICD-10-CM | POA: Insufficient documentation

## 2023-08-18 DIAGNOSIS — Z79899 Other long term (current) drug therapy: Secondary | ICD-10-CM | POA: Diagnosis not present

## 2023-08-18 DIAGNOSIS — Z7982 Long term (current) use of aspirin: Secondary | ICD-10-CM | POA: Insufficient documentation

## 2023-08-18 DIAGNOSIS — Z8546 Personal history of malignant neoplasm of prostate: Secondary | ICD-10-CM | POA: Insufficient documentation

## 2023-08-18 DIAGNOSIS — G8918 Other acute postprocedural pain: Secondary | ICD-10-CM | POA: Diagnosis not present

## 2023-08-18 DIAGNOSIS — Z87891 Personal history of nicotine dependence: Secondary | ICD-10-CM | POA: Insufficient documentation

## 2023-08-18 DIAGNOSIS — Z01818 Encounter for other preprocedural examination: Secondary | ICD-10-CM

## 2023-08-18 HISTORY — PX: TOTAL KNEE ARTHROPLASTY: SHX125

## 2023-08-18 LAB — GLUCOSE, CAPILLARY
Glucose-Capillary: 89 mg/dL (ref 70–99)
Glucose-Capillary: 84 mg/dL (ref 70–99)
Glucose-Capillary: 127 mg/dL — ABNORMAL HIGH (ref 70–99)
Glucose-Capillary: 146 mg/dL — ABNORMAL HIGH (ref 70–99)
Glucose-Capillary: 134 mg/dL — ABNORMAL HIGH (ref 70–99)

## 2023-08-18 SURGERY — ARTHROPLASTY, KNEE, TOTAL
Anesthesia: Spinal | Site: Knee | Laterality: Left

## 2023-08-18 MED ORDER — TAMSULOSIN HCL 0.4 MG PO CAPS
0.4000 mg | ORAL_CAPSULE | Freq: Every day | ORAL | Status: DC
  Administered 2023-08-18 – 2023-08-20 (×3): 0.4 mg via ORAL
  Filled 2023-08-18 (×3): qty 1

## 2023-08-18 MED ORDER — BUPIVACAINE IN DEXTROSE 0.75-8.25 % IT SOLN
INTRATHECAL | Status: DC | PRN
  Administered 2023-08-18: 1.8 mL via INTRATHECAL

## 2023-08-18 MED ORDER — PANTOPRAZOLE SODIUM 40 MG PO TBEC
40.0000 mg | DELAYED_RELEASE_TABLET | Freq: Every day | ORAL | Status: DC
  Administered 2023-08-19 – 2023-08-21 (×3): 40 mg via ORAL
  Filled 2023-08-18 (×4): qty 1

## 2023-08-18 MED ORDER — ONDANSETRON HCL 4 MG/2ML IJ SOLN: 4.0000 mg | Freq: Four times a day (QID) | INTRAMUSCULAR | Status: DC | PRN

## 2023-08-18 MED ORDER — ORAL CARE MOUTH RINSE: 15.0000 mL | Freq: Once | OROMUCOSAL | Status: AC

## 2023-08-18 MED ORDER — BUPIVACAINE-EPINEPHRINE (PF) 0.5% -1:200000 IJ SOLN
INTRAMUSCULAR | Status: AC
  Filled 2023-08-18: qty 30

## 2023-08-18 MED ORDER — ONDANSETRON HCL 4 MG PO TABS: 4.0000 mg | ORAL_TABLET | Freq: Four times a day (QID) | ORAL | Status: DC | PRN

## 2023-08-18 MED ORDER — MIDAZOLAM HCL 2 MG/2ML IJ SOLN
INTRAMUSCULAR | Status: AC
  Filled 2023-08-18: qty 2

## 2023-08-18 MED ORDER — PROPOFOL 500 MG/50ML IV EMUL
INTRAVENOUS | Status: AC
Start: 2023-08-18 — End: 2023-08-18
  Filled 2023-08-18: qty 50

## 2023-08-18 MED ORDER — BUPIVACAINE LIPOSOME 1.3 % IJ SUSP
INTRAMUSCULAR | Status: AC
  Filled 2023-08-18: qty 20

## 2023-08-18 MED ORDER — PHENYLEPHRINE HCL-NACL 20-0.9 MG/250ML-% IV SOLN
INTRAVENOUS | Status: DC | PRN
  Administered 2023-08-18: 30 ug/min via INTRAVENOUS

## 2023-08-18 MED ORDER — ACETAMINOPHEN 500 MG PO TABS
1000.0000 mg | ORAL_TABLET | Freq: Four times a day (QID) | ORAL | Status: AC
  Administered 2023-08-18 – 2023-08-19 (×4): 1000 mg via ORAL
  Filled 2023-08-18 (×4): qty 2

## 2023-08-18 MED ORDER — EPHEDRINE 5 MG/ML INJ
INTRAVENOUS | Status: AC
  Filled 2023-08-18: qty 5

## 2023-08-18 MED ORDER — ACETAMINOPHEN 325 MG PO TABS
325.0000 mg | ORAL_TABLET | Freq: Four times a day (QID) | ORAL | Status: DC | PRN
  Administered 2023-08-20 – 2023-08-21 (×3): 650 mg via ORAL
  Filled 2023-08-18 (×3): qty 2

## 2023-08-18 MED ORDER — 0.9 % SODIUM CHLORIDE (POUR BTL) OPTIME
TOPICAL | Status: DC | PRN
  Administered 2023-08-18: 1000 mL

## 2023-08-18 MED ORDER — LACTATED RINGERS IV SOLN: INTRAVENOUS | Status: DC

## 2023-08-18 MED ORDER — MECLIZINE HCL 25 MG PO TABS
25.0000 mg | ORAL_TABLET | Freq: Every day | ORAL | Status: DC
  Administered 2023-08-19 – 2023-08-21 (×3): 25 mg via ORAL
  Filled 2023-08-18 (×3): qty 1

## 2023-08-18 MED ORDER — SIMVASTATIN 40 MG PO TABS
40.0000 mg | ORAL_TABLET | Freq: Every day | ORAL | Status: DC
  Administered 2023-08-18 – 2023-08-20 (×3): 40 mg via ORAL
  Filled 2023-08-18 (×3): qty 1

## 2023-08-18 MED ORDER — EPHEDRINE SULFATE-NACL 50-0.9 MG/10ML-% IV SOSY
PREFILLED_SYRINGE | INTRAVENOUS | Status: DC | PRN
  Administered 2023-08-18: 10 mg via INTRAVENOUS
  Administered 2023-08-18: 5 mg via INTRAVENOUS
  Administered 2023-08-18: 10 mg via INTRAVENOUS

## 2023-08-18 MED ORDER — TRANEXAMIC ACID-NACL 1000-0.7 MG/100ML-% IV SOLN
1000.0000 mg | Freq: Once | INTRAVENOUS | Status: AC
  Administered 2023-08-18: 1000 mg via INTRAVENOUS
  Filled 2023-08-18: qty 100

## 2023-08-18 MED ORDER — SODIUM CHLORIDE 0.9 % IR SOLN
Status: DC | PRN
  Administered 2023-08-18: 1000 mL

## 2023-08-18 MED ORDER — PHENYLEPHRINE HCL-NACL 20-0.9 MG/250ML-% IV SOLN
INTRAVENOUS | Status: AC
  Filled 2023-08-18: qty 250

## 2023-08-18 MED ORDER — FENTANYL CITRATE (PF) 100 MCG/2ML IJ SOLN
INTRAMUSCULAR | Status: AC
  Filled 2023-08-18: qty 2

## 2023-08-18 MED ORDER — SODIUM CHLORIDE 0.9 % IV SOLN
INTRAVENOUS | Status: DC | PRN
  Administered 2023-08-18: 100 mL

## 2023-08-18 MED ORDER — MENTHOL 3 MG MT LOZG: 1.0000 | LOZENGE | OROMUCOSAL | Status: DC | PRN

## 2023-08-18 MED ORDER — HYDROMORPHONE HCL 2 MG PO TABS: 2.0000 mg | ORAL_TABLET | ORAL | Status: DC | PRN

## 2023-08-18 MED ORDER — HYDROMORPHONE HCL 2 MG PO TABS: 2.0000 mg | ORAL_TABLET | ORAL | 0 refills | Status: AC | PRN

## 2023-08-18 MED ORDER — CARVEDILOL 3.125 MG PO TABS
3.1250 mg | ORAL_TABLET | Freq: Every day | ORAL | Status: DC
  Administered 2023-08-18 – 2023-08-21 (×4): 3.125 mg via ORAL
  Filled 2023-08-18 (×4): qty 1

## 2023-08-18 MED ORDER — PROPOFOL 10 MG/ML IV BOLUS
INTRAVENOUS | Status: AC
  Filled 2023-08-18: qty 20

## 2023-08-18 MED ORDER — BISACODYL 5 MG PO TBEC
5.0000 mg | DELAYED_RELEASE_TABLET | Freq: Every day | ORAL | Status: DC | PRN
  Administered 2023-08-21: 5 mg via ORAL
  Filled 2023-08-18: qty 1

## 2023-08-18 MED ORDER — TRANEXAMIC ACID-NACL 1000-0.7 MG/100ML-% IV SOLN
1000.0000 mg | INTRAVENOUS | Status: AC
  Administered 2023-08-18: 1000 mg via INTRAVENOUS
  Filled 2023-08-18: qty 100

## 2023-08-18 MED ORDER — CHLORHEXIDINE GLUCONATE 0.12 % MT SOLN
15.0000 mL | Freq: Once | OROMUCOSAL | Status: AC
  Administered 2023-08-18: 15 mL via OROMUCOSAL

## 2023-08-18 MED ORDER — DOCUSATE SODIUM 100 MG PO CAPS
100.0000 mg | ORAL_CAPSULE | Freq: Two times a day (BID) | ORAL | Status: DC
  Administered 2023-08-18 – 2023-08-21 (×6): 100 mg via ORAL
  Filled 2023-08-18 (×6): qty 1

## 2023-08-18 MED ORDER — METOCLOPRAMIDE HCL 5 MG/ML IJ SOLN: 5.0000 mg | Freq: Three times a day (TID) | INTRAMUSCULAR | Status: DC | PRN

## 2023-08-18 MED ORDER — FENTANYL CITRATE (PF) 250 MCG/5ML IJ SOLN
INTRAMUSCULAR | Status: DC | PRN
  Administered 2023-08-18 (×2): 50 ug via INTRAVENOUS

## 2023-08-18 MED ORDER — HYDROMORPHONE HCL 2 MG PO TABS
1.0000 mg | ORAL_TABLET | ORAL | Status: DC | PRN
  Administered 2023-08-18 – 2023-08-19 (×4): 2 mg via ORAL
  Filled 2023-08-18 (×4): qty 1

## 2023-08-18 MED ORDER — ONDANSETRON HCL 4 MG/2ML IJ SOLN
INTRAMUSCULAR | Status: AC
Start: 2023-08-18 — End: 2023-08-18
  Filled 2023-08-18: qty 2

## 2023-08-18 MED ORDER — ALUM & MAG HYDROXIDE-SIMETH 200-200-20 MG/5ML PO SUSP: 30.0000 mL | ORAL | Status: DC | PRN

## 2023-08-18 MED ORDER — CEFAZOLIN SODIUM-DEXTROSE 2-4 GM/100ML-% IV SOLN
2.0000 g | INTRAVENOUS | Status: AC
  Administered 2023-08-18: 2 g via INTRAVENOUS
  Filled 2023-08-18: qty 100

## 2023-08-18 MED ORDER — BUPIVACAINE-EPINEPHRINE (PF) 0.5% -1:200000 IJ SOLN
INTRAMUSCULAR | Status: DC | PRN
  Administered 2023-08-18: 20 mL via PERINEURAL

## 2023-08-18 MED ORDER — POLYETHYLENE GLYCOL 3350 17 G PO PACK
17.0000 g | PACK | Freq: Every day | ORAL | Status: DC
  Administered 2023-08-18 – 2023-08-20 (×3): 17 g via ORAL
  Filled 2023-08-18 (×3): qty 1

## 2023-08-18 MED ORDER — METFORMIN HCL 500 MG PO TABS
500.0000 mg | ORAL_TABLET | Freq: Every day | ORAL | Status: DC
  Administered 2023-08-19 – 2023-08-21 (×3): 500 mg via ORAL
  Filled 2023-08-18 (×3): qty 1

## 2023-08-18 MED ORDER — METHOCARBAMOL 500 MG PO TABS
500.0000 mg | ORAL_TABLET | Freq: Four times a day (QID) | ORAL | Status: DC | PRN
  Administered 2023-08-18 – 2023-08-20 (×5): 500 mg via ORAL
  Filled 2023-08-18 (×5): qty 1

## 2023-08-18 MED ORDER — POVIDONE-IODINE 10 % EX SWAB: 2.0000 | Freq: Once | CUTANEOUS | Status: AC

## 2023-08-18 MED ORDER — METOCLOPRAMIDE HCL 5 MG PO TABS: 5.0000 mg | ORAL_TABLET | Freq: Three times a day (TID) | ORAL | Status: DC | PRN

## 2023-08-18 MED ORDER — ACETAMINOPHEN 10 MG/ML IV SOLN
INTRAVENOUS | Status: AC
  Filled 2023-08-18: qty 100

## 2023-08-18 MED ORDER — BUPIVACAINE LIPOSOME 1.3 % IJ SUSP: 20.0000 mL | Freq: Once | INTRAMUSCULAR | Status: AC

## 2023-08-18 MED ORDER — ACETAMINOPHEN 10 MG/ML IV SOLN
1000.0000 mg | Freq: Once | INTRAVENOUS | Status: AC
  Administered 2023-08-18: 1000 mg via INTRAVENOUS

## 2023-08-18 MED ORDER — TIZANIDINE HCL 2 MG PO TABS
2.0000 mg | ORAL_TABLET | Freq: Four times a day (QID) | ORAL | 0 refills | Status: AC | PRN
Start: 2023-08-18 — End: ?

## 2023-08-18 MED ORDER — PROPOFOL 1000 MG/100ML IV EMUL
INTRAVENOUS | Status: AC
  Filled 2023-08-18: qty 100

## 2023-08-18 MED ORDER — POLYETHYLENE GLYCOL 3350 17 G PO PACK
17.0000 g | PACK | Freq: Every day | ORAL | Status: DC | PRN
  Administered 2023-08-20: 17 g via ORAL
  Filled 2023-08-18: qty 1

## 2023-08-18 MED ORDER — SODIUM CHLORIDE (PF) 0.9 % IJ SOLN
INTRAMUSCULAR | Status: AC
  Filled 2023-08-18: qty 50

## 2023-08-18 MED ORDER — METHOCARBAMOL 500 MG PO TABS
ORAL_TABLET | ORAL | Status: AC
  Filled 2023-08-18: qty 1

## 2023-08-18 MED ORDER — HYDROMORPHONE HCL 1 MG/ML IJ SOLN
0.5000 mg | INTRAMUSCULAR | Status: DC | PRN
  Administered 2023-08-18: 1 mg via INTRAVENOUS
  Filled 2023-08-18: qty 1

## 2023-08-18 MED ORDER — POLYETHYLENE GLYCOL 3350 17 GM/SCOOP PO POWD: 17.0000 g | Freq: Every evening | ORAL | Status: DC

## 2023-08-18 MED ORDER — ACETAMINOPHEN 500 MG PO TABS: 1000.0000 mg | ORAL_TABLET | Freq: Four times a day (QID) | ORAL | Status: DC

## 2023-08-18 MED ORDER — LISINOPRIL 5 MG PO TABS
5.0000 mg | ORAL_TABLET | Freq: Every day | ORAL | Status: DC
  Administered 2023-08-19 – 2023-08-21 (×3): 5 mg via ORAL
  Filled 2023-08-18 (×4): qty 1

## 2023-08-18 MED ORDER — PROPOFOL 500 MG/50ML IV EMUL
INTRAVENOUS | Status: DC | PRN
Start: 2023-08-18 — End: 2023-08-18
  Administered 2023-08-18: 100 ug/kg/min via INTRAVENOUS

## 2023-08-18 MED ORDER — ASPIRIN 325 MG PO TBEC: 325.0000 mg | DELAYED_RELEASE_TABLET | Freq: Two times a day (BID) | ORAL | 0 refills | Status: AC

## 2023-08-18 MED ORDER — METHOCARBAMOL 1000 MG/10ML IJ SOLN: 500.0000 mg | Freq: Four times a day (QID) | INTRAMUSCULAR | Status: DC | PRN

## 2023-08-18 MED ORDER — FLEET ENEMA RE ENEM: 1.0000 | ENEMA | Freq: Once | RECTAL | Status: DC | PRN

## 2023-08-18 MED ORDER — DEXAMETHASONE SODIUM PHOSPHATE 10 MG/ML IJ SOLN
10.0000 mg | Freq: Once | INTRAMUSCULAR | Status: AC
  Administered 2023-08-19: 10 mg via INTRAVENOUS
  Filled 2023-08-18: qty 1

## 2023-08-18 MED ORDER — APOAEQUORIN 20 MG PO CAPS: 20.0000 mg | ORAL_CAPSULE | Freq: Every evening | ORAL | Status: DC

## 2023-08-18 MED ORDER — PHENOL 1.4 % MT LIQD: 1.0000 | OROMUCOSAL | Status: DC | PRN

## 2023-08-18 MED ORDER — ASPIRIN 81 MG PO CHEW
324.0000 mg | CHEWABLE_TABLET | Freq: Two times a day (BID) | ORAL | Status: DC
  Administered 2023-08-18 – 2023-08-21 (×6): 324 mg via ORAL
  Filled 2023-08-18 (×6): qty 4

## 2023-08-18 MED ORDER — ACETAMINOPHEN 10 MG/ML IV SOLN: 1000.0000 mg | Freq: Once | INTRAVENOUS | Status: DC

## 2023-08-18 MED ORDER — DIPHENHYDRAMINE HCL 12.5 MG/5ML PO ELIX: 12.5000 mg | ORAL_SOLUTION | ORAL | Status: DC | PRN

## 2023-08-18 MED ORDER — FENTANYL CITRATE PF 50 MCG/ML IJ SOSY: 25.0000 ug | PREFILLED_SYRINGE | INTRAMUSCULAR | Status: DC | PRN

## 2023-08-18 MED ORDER — EZETIMIBE 10 MG PO TABS
10.0000 mg | ORAL_TABLET | Freq: Every day | ORAL | Status: DC
  Administered 2023-08-18 – 2023-08-21 (×4): 10 mg via ORAL
  Filled 2023-08-18 (×4): qty 1

## 2023-08-18 MED ORDER — INSULIN ASPART 100 UNIT/ML IJ SOLN
0.0000 [IU] | Freq: Three times a day (TID) | INTRAMUSCULAR | Status: DC
  Administered 2023-08-18: 2 [IU] via SUBCUTANEOUS
  Administered 2023-08-19 (×2): 3 [IU] via SUBCUTANEOUS
  Administered 2023-08-21: 2 [IU] via SUBCUTANEOUS

## 2023-08-18 MED ORDER — KCL IN DEXTROSE-NACL 20-5-0.45 MEQ/L-%-% IV SOLN
INTRAVENOUS | Status: AC
  Filled 2023-08-18 (×3): qty 1000

## 2023-08-18 SURGICAL SUPPLY — 47 items
ATTUNE MED DOME PAT 41 KNEE (Knees) IMPLANT
ATTUNE PS FEM LT SZ 6 CEM KNEE (Femur) IMPLANT
ATTUNE PSRP INSR SZ6 6 KNEE (Insert) IMPLANT
BAG COUNTER SPONGE SURGICOUNT (BAG) IMPLANT
BAG ZIPLOCK 12X15 (MISCELLANEOUS) ×1 IMPLANT
BASE TIBIAL ROT PLAT SZ 7 KNEE (Knees) IMPLANT
BLADE SAGITTAL 25.0X1.19X90 (BLADE) ×1 IMPLANT
BLADE SAW SGTL 13.0X1.19X90.0M (BLADE) ×1 IMPLANT
BNDG ELASTIC 6INX 5YD STR LF (GAUZE/BANDAGES/DRESSINGS) ×1 IMPLANT
BOOTIES KNEE HIGH SLOAN (MISCELLANEOUS) ×1 IMPLANT
BOWL SMART MIX CTS (DISPOSABLE) ×1 IMPLANT
CEMENT HV SMART SET (Cement) ×2 IMPLANT
COVER SURGICAL LIGHT HANDLE (MISCELLANEOUS) ×1 IMPLANT
CUFF TRNQT CYL 34X4.125X (TOURNIQUET CUFF) ×1 IMPLANT
DERMABOND ADVANCED .7 DNX12 (GAUZE/BANDAGES/DRESSINGS) IMPLANT
DRAPE INCISE IOBAN 66X45 STRL (DRAPES) ×1 IMPLANT
DRAPE U-SHAPE 47X51 STRL (DRAPES) ×1 IMPLANT
DRSG AQUACEL AG ADV 3.5X10 (GAUZE/BANDAGES/DRESSINGS) ×1 IMPLANT
DURAPREP 26ML APPLICATOR (WOUND CARE) ×1 IMPLANT
ELECT PENCIL ROCKER SW 15FT (MISCELLANEOUS) ×1 IMPLANT
ELECT REM PT RETURN 15FT ADLT (MISCELLANEOUS) ×1 IMPLANT
GLOVE BIOGEL PI IND STRL 8 (GLOVE) ×2 IMPLANT
GLOVE ECLIPSE 7.5 STRL STRAW (GLOVE) ×2 IMPLANT
GOWN STRL REUS W/ TWL XL LVL3 (GOWN DISPOSABLE) ×2 IMPLANT
HOLDER FOLEY CATH W/STRAP (MISCELLANEOUS) IMPLANT
HOOD PEEL AWAY T7 (MISCELLANEOUS) ×3 IMPLANT
KIT TURNOVER KIT A (KITS) ×1 IMPLANT
MANIFOLD NEPTUNE II (INSTRUMENTS) ×1 IMPLANT
NDL HYPO 22X1.5 SAFETY MO (MISCELLANEOUS) ×2 IMPLANT
NEEDLE HYPO 22X1.5 SAFETY MO (MISCELLANEOUS) ×2 IMPLANT
NS IRRIG 1000ML POUR BTL (IV SOLUTION) ×1 IMPLANT
PACK TOTAL KNEE CUSTOM (KITS) ×1 IMPLANT
PADDING CAST COTTON 6X4 STRL (CAST SUPPLIES) ×1 IMPLANT
PIN STEINMAN FIXATION KNEE (PIN) IMPLANT
PROTECTOR NERVE ULNAR (MISCELLANEOUS) ×1 IMPLANT
SET HNDPC FAN SPRY TIP SCT (DISPOSABLE) ×1 IMPLANT
SPIKE FLUID TRANSFER (MISCELLANEOUS) ×2 IMPLANT
SUT MNCRL AB 3-0 PS2 18 (SUTURE) ×1 IMPLANT
SUT VIC AB 0 CT1 36 (SUTURE) ×2 IMPLANT
SUT VIC AB 1 CT1 36 (SUTURE) ×2 IMPLANT
SUT VIC AB 2-0 CT1 TAPERPNT 27 (SUTURE) ×1 IMPLANT
SYR CONTROL 10ML LL (SYRINGE) ×2 IMPLANT
TRAY CATH INTERMITTENT SS 16FR (CATHETERS) IMPLANT
TRAY FOLEY MTR SLVR 16FR STAT (SET/KITS/TRAYS/PACK) IMPLANT
TUBE SUCTION HIGH CAP CLEAR NV (SUCTIONS) ×1 IMPLANT
WATER STERILE IRR 1000ML POUR (IV SOLUTION) ×2 IMPLANT
WRAP KNEE MAXI GEL POST OP (GAUZE/BANDAGES/DRESSINGS) ×1 IMPLANT

## 2023-08-18 NOTE — Progress Notes (Signed)
 Orthopedic Tech Progress Note Patient Details:  Fionn Stracke May 03, 1945 [id number]  Patient ID: Larnell CHRISTELLA Vannie Mickey., male   DOB: 01-19-1946, 78 y.o.   MRN: [id number] Unable to place CPM at this time due to patient being on a stretcher and not a hospital bed. Laymon DELENA Munroe 08/18/2023, 10:31 AM

## 2023-08-18 NOTE — Transfer of Care (Signed)
 Immediate Anesthesia Transfer of Care Note  Patient: Jordan [name].  Procedure(s) Performed: ARTHROPLASTY, KNEE, TOTAL (Left: Knee)  Patient Location: PACU  Anesthesia Type:MAC and Spinal  Level of Consciousness: awake, alert , and oriented  Airway & Oxygen Therapy: Patient Spontanous Breathing and Patient connected to face mask oxygen  Post-op Assessment: Report given to RN and Post -op Vital signs reviewed and stable  Post vital signs: Reviewed and stable  Last Vitals:  Vitals Value Taken Time  BP    Temp    Pulse    Resp    SpO2      Last Pain:  Vitals:   08/18/23 0654  TempSrc:   PainSc: 0-No pain         Complications: No notable events documented.

## 2023-08-18 NOTE — Progress Notes (Signed)
 Orthopedic Tech Progress Note Patient Details:  Jordan [name] [birth year] [id number]  Ortho Devices Type of Ortho Device: Bone foam zero knee Ortho Device/Splint Interventions: Ordered   Post Interventions Patient Tolerated: Well Bone foam applied, per order CPM is to be applied next morning (6/24). [name] 08/18/2023, 10:56 AM

## 2023-08-18 NOTE — Plan of Care (Signed)
   Problem: Activity: Goal: Risk for activity intolerance will decrease Outcome: Progressing   Problem: Pain Managment: Goal: General experience of comfort will improve and/or be controlled Outcome: Progressing   Problem: Safety: Goal: Ability to remain free from injury will improve Outcome: Progressing

## 2023-08-18 NOTE — Evaluation (Signed)
 Physical Therapy Evaluation Patient Details Name: Jordan [name]. MRN: [id number] DOB: Jul 02, 1945 Today's Date: 08/18/2023  History of Present Illness  78 yo male presents to therapy s/p L TKA on 08/18/2023 due to failure of conservative measures. Pt PMH includes but is not limited to: OA, prostate ca, DM II, OSA,  CAD s/p cardiac cath, HLD,GERD, vertigo, CAD, diverticulosis, and HTN.  Clinical Impression   Jordan [name]. is a 78 y.o. male POD 0 s/p L TKA. Patient reports mod I with mobility at baseline. Patient is now limited by functional impairments (see PT problem list below) and requires CGA for bed mobility and CGA and cues  for transfers. Patient was able to ambulate 10 feet with RW and CGA level of assist. Patient instructed in exercise to facilitate ROM and circulation to manage edema. Patient will benefit from continued skilled PT interventions to address impairments and progress towards PLOF. Acute PT will follow to progress mobility and stair training in preparation for safe discharge home with family support and [facility] services.       If plan is discharge home, recommend the following: A little help with walking and/or transfers;A little help with bathing/dressing/bathroom;Assistance with cooking/housework;Help with stairs or ramp for entrance;Assist for transportation   Can travel by private vehicle        Equipment Recommendations None recommended by PT  Recommendations for Other Services       Functional Status Assessment Patient has had a recent decline in their functional status and demonstrates the ability to make significant improvements in function in a reasonable and predictable amount of time.     Precautions / Restrictions Precautions Precautions: Knee;Fall Restrictions Weight Bearing Restrictions Per Provider Order: No      Mobility  Bed Mobility Overal bed mobility: Needs Assistance Bed Mobility: Supine to Sit     Supine to sit: Contact guard, HOB  elevated, Used rails     General bed mobility comments: min cues    Transfers Overall transfer level: Needs assistance Equipment used: Rolling Prevette (2 wheels) Transfers: Sit to/from Stand Sit to Stand: Contact guard assist, From elevated surface           General transfer comment: min cues for power up and extension posture, pull to stand    Ambulation/Gait Ambulation/Gait assistance: Contact guard assist Gait Distance (Feet): 10 Feet Assistive device: Rolling Wilhelmsen (2 wheels) Gait Pattern/deviations: Step-to pattern, Antalgic, Trunk flexed Gait velocity: decreased     General Gait Details: trunk flexion with B UE support at RW to offload L LE in stance phase, min cues for safety and posture pt exhibited limited L stance time and minimal R foot clearance  Stairs            Wheelchair Mobility     Tilt Bed    Modified Rankin (Stroke Patients Only)       Balance Overall balance assessment: Needs assistance Sitting-balance support: Feet supported Sitting balance-Leahy Scale: Good     Standing balance support: Bilateral upper extremity supported, During functional activity, Reliant on assistive device for balance Standing balance-Leahy Scale: Poor                               Pertinent Vitals/Pain Pain Assessment Pain Assessment: 0-10 Pain Score: 5  Pain Location: L  knee and LE Pain Descriptors / Indicators: Aching, Constant, Discomfort, Dull, Operative site guarding, Grimacing Pain Intervention(s): Limited activity within patient's tolerance, Monitored  during session, Premedicated before session, Repositioned, Ice applied    Home Living Family/patient expects to be discharged to:: Private residence Living Arrangements: Alone Available Help at Discharge: Family Type of Home: House Home Access: Stairs to enter Entrance Stairs-Rails: Can reach both;Left;Right Entrance Stairs-Number of Steps: 3   Home Layout: Two level;Laundry or  work area in basement;Able to live on main level with bedroom/bathroom Home Equipment: Agricultural consultant (2 wheels);Rollator (4 wheels);Cane - single point      Prior Function Prior Level of Function : Independent/Modified Independent             Mobility Comments: mod I with use of rollator for all mobiltiy tasks, self care and ADLs.       Extremity/Trunk Assessment        Lower Extremity Assessment Lower Extremity Assessment: LLE deficits/detail LLE Deficits / Details: ankle DF 5/5, PF 4/5; SLR < 10 degree lag LLE Sensation: WNL    Cervical / Trunk Assessment Cervical / Trunk Assessment: Normal  Communication   Communication Communication: No apparent difficulties    Cognition Arousal: Alert Behavior During Therapy: WFL for tasks assessed/performed   PT - Cognitive impairments: No apparent impairments                         Following commands: Intact       Cueing       General Comments      Exercises Total Joint Exercises Ankle Circles/Pumps: AROM, Both, 5 reps   Assessment/Plan    PT Assessment Patient needs continued PT services  PT Problem List Decreased strength;Decreased range of motion;Decreased activity tolerance;Decreased balance;Decreased mobility;Decreased coordination;Pain       PT Treatment Interventions DME instruction;Gait training;Stair training;Functional mobility training;Therapeutic activities;Therapeutic exercise;Balance training;Neuromuscular re-education;Patient/family education;Modalities    PT Goals (Current goals can be found in the Care Plan section)  Acute Rehab PT Goals Patient Stated Goal: to be able to get up and move no pain PT Goal Formulation: With patient Time For Goal Achievement: 09/01/23 Potential to Achieve Goals: Good    Frequency 7X/week     Co-evaluation               AM-PAC PT 6 Clicks Mobility  Outcome Measure Help needed turning from your back to your side while in a flat bed  without using bedrails?: A Little Help needed moving from lying on your back to sitting on the side of a flat bed without using bedrails?: A Little Help needed moving to and from a bed to a chair (including a wheelchair)?: A Little Help needed standing up from a chair using your arms (e.g., wheelchair or bedside chair)?: A Little Help needed to walk in hospital room?: A Little Help needed climbing 3-5 steps with a railing? : Total 6 Click Score: 16    End of Session Equipment Utilized During Treatment: Gait belt Activity Tolerance: Patient tolerated treatment well Patient left: in chair;with call bell/phone within reach;with family/visitor present Nurse Communication: Mobility status PT Visit Diagnosis: Unsteadiness on feet (R26.81);Other abnormalities of gait and mobility (R26.89);Muscle weakness (generalized) (M62.81);Ataxic gait (R26.0);Difficulty in walking, not elsewhere classified (R26.2);Pain Pain - Right/Left: Left Pain - part of body: Leg;Knee    Time: 8494-8470 PT Time Calculation (min) (ACUTE ONLY): 24 min   Charges:   PT Evaluation $PT Eval Low Complexity: 1 Low PT Treatments $Gait Training: 8-22 mins PT General Charges $$ ACUTE PT VISIT: 1 Visit  [place], PT Acute Rehab   [place] VEAR Drone 08/18/2023, 4:12 PM

## 2023-08-18 NOTE — Op Note (Signed)
 PATIENT ID:      Jordan [name].  MRN:     [id number] DOB/AGE:    06/08/1945 / 78 y.o.       OPERATIVE REPORT   DATE OF PROCEDURE:  08/18/2023      PREOPERATIVE DIAGNOSIS:   LEFT KNEE DEGENERATIVE JOINT DISEASE      Estimated body mass index is 37.61 kg/m as calculated from the following:   Height as of this encounter: 5' 5 (1.651 m).   Weight as of this encounter: 102.5 kg.                                                       POSTOPERATIVE DIAGNOSIS:   Same                                                                  PROCEDURE:  Procedure(s): ARTHROPLASTY, KNEE, TOTAL Using DepuyAttune RP implants #6 Femur, #7Tibia, 6 mm Attune RP bearing, 41 Patella    SURGEON: Norleen LITTIE Gavel  ASSISTANT:   Camellia POUR. [employer]   (Present and scrubbed throughout the case, critical for assistance with exposure, retraction, instrumentation, and closure.)        ANESTHESIA: spinal, 20cc Exparel, 50cc 0.25% Marcaine EBL:min cc FLUID REPLACEMENT: unk cc crystaloid TOURNIQUET: none DRAINS: None TRANEXAMIC ACID: 1gm IV, 2gm topical COMPLICATIONS:  None         INDICATIONS FOR PROCEDURE: The patient has  LEFT KNEE DEGENERATIVE JOINT DISEASE, mild valgus deformities, XR shows bone on bone arthritis, lateral subluxation of tibia. Patient has failed all conservative measures including anti-inflammatory medicines, narcotics, attempts at exercise and weight loss, cortisone injections and viscosupplementation.  Risks and benefits of surgery have been discussed, questions answered.   DESCRIPTION OF PROCEDURE: The patient identified by armband, received  IV antibiotics, in the holding area at [facility]. Patient taken to the operating room, appropriate anesthetic monitors were attached, and spinal anesthesia was  induced. IV Tranexamic acid was given. Lateral post and 2 surefoot positioners applied to the table, the lower extremity was then prepped and draped in usual sterile fashion from the toes to  the high thigh. Time-out procedure was performed. Camellia POUR. [facility] PAC, was present and scrubbed throughout the case, critical for assistance with, positioning, exposure, retraction, instrumentation, and closure.The skin and subcutaneous tissue along the incision was injected with 20 cc of a mixture of 20cc Exparel and 30cc Marcaine 50cc saline solution, using a 21-gauge by 1-1/2 inch needle. We began the operation, with the knee flexed 130 degrees, by making the anterior midline incision starting at handbreadth above the patella going over the patella 1 cm medial to and 4 cm distal to the tibial tubercle. Small bleeders in the skin and the subcutaneous tissue identified and cauterized. Transverse retinaculum was incised and reflected medially and a medial parapatellar arthrotomy was accomplished. the patella was everted and theprepatellar fat pad resected. The superficial medial collateral ligament was then elevated from anterior to posterior along the proximal flare of the tibia and anterior half of the menisci resected. The knee was hyperflexed exposing  bone on bone arthritis. Peripheral and notch osteophytes as well as the cruciate ligaments were then resected. We continued to work our way around posteriorly along the proximal tibia, and externally rotated the tibia subluxing it out from underneath the femur. A McHale PCL retractor was placed through the notch, a lateral Hohmann retractor, and anterolateral small homan retractor placed. We then entered the proximal tibia with the Depuy starter drill in line with the axis of the tibia followed by an intramedullary guide rod and 3 degree posterior slope cutting guide. The tibial cutting guide, was pinned into place allowing resection of 10 mm of bone medially and 2 mm of bone laterally. Satisfied with the tibial resection, we then entered the distal femur 2 mm anterior to the PCL origin with the starter drill, followed by the intramedullary guide rod and applied  the distal femoral cutting guide set at 9 mm, with 5 degrees of valgus. This was pinned along the epicondylar axis after rotation to the epicondylar axis. At this point, the distal femoral cut was accomplished without difficulty. We then sized for a #6 femoral component and pinned the chamfer guide in zero degrees of external rotation to match the epicondylar axis. The anterior, posterior, and chamfer cuts were accomplished without difficulty followed by the Attune RP box cutting guide and the box cut. We also removed posterior osteophytes from the posterior femoral condyles. The posterior capsule was injected with Exparel solution. The knee was brought into full extension. We checked our extension gap and fit a 6 mm trial lollipop.  At this point we needed to release the IT band and posterolateral capsule to balance the knee in extension.  Distracting in extension with a lamina spreader,  bleeders in the posterior capsule, Posterior medial and posterior lateral gutter were cauterized.  The transexamic acid-soaked sponge was then placed in the gap of the knee in extension. The knee was flexed 30. The posterior patella cut was accomplished with the 9.5 mm Attune cutting guide, sized for a 41 mm dome, and the fixation pegs drilled.The knee was then once again hyperflexed exposing the proximal tibia. We sized for a # 7 tibial base plate, applied the smokestack and the conical reamer followed by the the Delta fin keel punch. We then hammered into place the Attune RP trial femoral component, drilled the lugs, inserted a  6 mm trial bearing, trial patellar button, and took the knee through range of motion from 0-130 degrees. Medial and lateral ligamentous stability was checked. No thumb pressure was required for patellar Tracking.  All trial components were removed, mating surfaces irrigated with pulse lavage, and dried with suction and sponges. Exparel solution was applied to the cancellus bone of the patella distal  femur and proximal tibia.  After waiting 30 seconds, the bony surfaces were again, dried with sponges. A double batch of DePuy HV cement was mixed and applied to all bony metallic mating surfaces except for the posterior condyles of the femur itself. In order, we hammered into place the tibial tray and removed excess cement, the femoral component and removed excess cement. The final Attune RP bearing was inserted, and the knee brought to full extension with compression. The patellar button was clamped into place, and excess cement removed. The knee was held at 30 flexion with compression using the second surefoot, while the cement cured. The wound was irrigated out with normal saline solution pulse lavage. The rest of the Exparel was injected into the parapatellar arthrotomy, subcutaneous tissues, and  periosteal tissues. The parapatellar arthrotomy was closed with running #1 Vicryl suture. The subcutaneous tissue with 3-0 undyed Vicryl suture, and the skin with running 3-0 SQ vicryl. An Aquacil dressing and Ace wrap were applied. The patient was taken to recovery room without difficulty.   Norleen LITTIE Gavel 08/18/2023, 9:18 AM

## 2023-08-18 NOTE — Anesthesia Postprocedure Evaluation (Signed)
 Anesthesia Post Note  Patient: Jayjay Littles.  Procedure(s) Performed: ARTHROPLASTY, KNEE, TOTAL (Left: Knee)     Patient location during evaluation: PACU Anesthesia Type: Spinal and Regional Level of consciousness: oriented and awake and alert Pain management: pain level controlled Vital Signs Assessment: post-procedure vital signs reviewed and stable Respiratory status: spontaneous breathing and respiratory function stable Cardiovascular status: blood pressure returned to baseline and stable Postop Assessment: no headache, no backache, no apparent nausea or vomiting, spinal receding and patient able to bend at knees Anesthetic complications: no  No notable events documented.  Last Vitals:  Vitals:   08/18/23 1019 08/18/23 1030  BP: (!) 110/57 (!) 105/58  Pulse: 68 83  Resp: 16 18  Temp: 36.7 C   SpO2: 97% 97%    Last Pain:  Vitals:   08/18/23 1019  TempSrc:   PainSc: 0-No pain                 Johsua Shevlin,W. EDMOND

## 2023-08-18 NOTE — Discharge Instructions (Signed)

## 2023-08-18 NOTE — Anesthesia Procedure Notes (Signed)
 Anesthesia Regional Block: Adductor canal block   Pre-Anesthetic Checklist: , timeout performed,  Correct Patient, Correct Site, Correct Laterality,  Correct Procedure, Correct Position, site marked,  Risks and benefits discussed,  Pre-op evaluation,  At surgeon's request and post-op pain management  Laterality: Left  Prep: Maximum Sterile Barrier Precautions used, chloraprep       Needles:  Injection technique: Single-shot  Needle Type: Echogenic Stimulator Needle     Needle Length: 9cm  Needle Gauge: 21     Additional Needles:   Procedures:,,,, ultrasound used (permanent image in chart),,    Narrative:  Start time: 08/18/2023 7:07 AM End time: 08/18/2023 7:17 AM Injection made incrementally with aspirations every 5 mL.  Performed by: Personally  Anesthesiologist: Epifanio Fallow, MD

## 2023-08-18 NOTE — Interval H&P Note (Signed)
 History and Physical Interval Note:  08/18/2023 7:34 AM  Jordan [name].  has presented today for surgery, with the diagnosis of LEFT KNEE DEGENERATIVE JOINT DISEASE.  The various methods of treatment have been discussed with the patient and family. After consideration of risks, benefits and other options for treatment, the patient has consented to  Procedure(s): ARTHROPLASTY, KNEE, TOTAL (Left) as a surgical intervention.  The patient's history has been reviewed, patient examined, no change in status, stable for surgery.  I have reviewed the patient's chart and labs.  Questions were answered to the patient's satisfaction.     Norleen LITTIE Gavel

## 2023-08-18 NOTE — Anesthesia Procedure Notes (Signed)
 Spinal  Patient location during procedure: OR Start time: 08/18/2023 7:39 AM End time: 08/18/2023 7:44 AM Reason for block: surgical anesthesia Staffing Performed: anesthesiologist  Anesthesiologist: Epifanio Fallow, MD Performed by: Epifanio Fallow, MD Authorized by: Epifanio Fallow, MD   Preanesthetic Checklist Completed: patient identified, IV checked, risks and benefits discussed, surgical consent, monitors and equipment checked, pre-op evaluation and timeout performed Spinal Block Patient position: sitting Prep: DuraPrep Patient monitoring: cardiac monitor, continuous pulse ox and blood pressure Approach: midline Location: L3-4 Injection technique: single-shot Needle Needle type: Pencan  Needle gauge: 24 G Needle length: 9 cm Assessment Sensory level: T8 Events: CSF return Additional Notes Functioning IV was confirmed and monitors were applied. Sterile prep and drape, including hand hygiene and sterile gloves were used. The patient was positioned and the spine was prepped. The skin was anesthetized with lidocaine .  Free flow of clear CSF was obtained prior to injecting local anesthetic into the CSF.  The spinal needle aspirated freely following injection.  The needle was carefully withdrawn.  The patient tolerated the procedure well.

## 2023-08-19 ENCOUNTER — Encounter (HOSPITAL_COMMUNITY): Payer: Self-pay | Admitting: Orthopedic Surgery

## 2023-08-19 DIAGNOSIS — M1712 Unilateral primary osteoarthritis, left knee: Secondary | ICD-10-CM | POA: Diagnosis not present

## 2023-08-19 LAB — GLUCOSE, CAPILLARY
Glucose-Capillary: 111 mg/dL — ABNORMAL HIGH (ref 70–99)
Glucose-Capillary: 168 mg/dL — ABNORMAL HIGH (ref 70–99)
Glucose-Capillary: 157 mg/dL — ABNORMAL HIGH (ref 70–99)
Glucose-Capillary: 199 mg/dL — ABNORMAL HIGH (ref 70–99)

## 2023-08-19 NOTE — Progress Notes (Signed)
 Physical Therapy Treatment Patient Details Name: Jordan [name]. MRN: [id number] DOB: 1945-09-05 Today's Date: 08/19/2023   History of Present Illness 78 yo male presents to therapy s/p L TKA on 08/18/2023 due to failure of conservative measures. Pt PMH includes but is not limited to: OA, prostate ca, DM II, OSA,  CAD s/p cardiac cath, HLD,GERD, vertigo, CAD, diverticulosis, and HTN.    PT Comments  Pt more alert and making gradual progress but does still need min A for transfers, fatigues easily, and with low foot clearance not able to perform stairs yet.  Ambulated 34' with chair follow for safety.  Needs further therapy prior to return home.  Continue POC.     If plan is discharge home, recommend the following: A little help with walking and/or transfers;A little help with bathing/dressing/bathroom;Assistance with cooking/housework;Help with stairs or ramp for entrance;Assist for transportation   Can travel by private vehicle        Equipment Recommendations  None recommended by PT    Recommendations for Other Services       Precautions / Restrictions Precautions Precautions: Knee;Fall Restrictions Weight Bearing Restrictions Per Provider Order: Yes LLE Weight Bearing Per Provider Order: Weight bearing as tolerated     Mobility  Bed Mobility Overal bed mobility: Needs Assistance Bed Mobility: Supine to Sit, Sit to Supine     Supine to sit: Min assist Sit to supine: Min assist   General bed mobility comments: increased time and cues    Transfers Overall transfer level: Needs assistance Equipment used: Rolling Boghosian (2 wheels) Transfers: Sit to/from Stand Sit to Stand: Min assist, From elevated surface           General transfer comment: min cues for hand placement with min A to rise; increased time; performed x 2 (urinary incontinence on first stand requiring assist for cleaning)    Ambulation/Gait Ambulation/Gait assistance: Contact guard assist, +2  safety/equipment Gait Distance (Feet): 50 Feet Assistive device: Rolling Penny (2 wheels) Gait Pattern/deviations: Step-to pattern, Antalgic, Trunk flexed, Shuffle Gait velocity: decreased     General Gait Details: Chair follow for safety; improved RW proximity but still wtih flexed posture needing cues to correct as able.  Performed 1 turn with cues for RW and small steps   Stairs Stairs:  (does not have foot clearance required)           Wheelchair Mobility     Tilt Bed    Modified Rankin (Stroke Patients Only)       Balance Overall balance assessment: Needs assistance Sitting-balance support: Feet supported Sitting balance-Leahy Scale: Good     Standing balance support: Bilateral upper extremity supported, During functional activity, Reliant on assistive device for balance Standing balance-Leahy Scale: Poor                              Communication    Cognition Arousal: Alert Behavior During Therapy: WFL for tasks assessed/performed   PT - Cognitive impairments: No apparent impairments                                Cueing    Exercises Total Joint Exercises Ankle Circles/Pumps: AROM, Both, 10 reps, Supine Quad Sets: AROM, Both, 10 reps, Supine Heel Slides: AAROM, Left, 10 reps, Supine Hip ABduction/ADduction: AAROM, Left, 10 reps, Supine Long Arc Quad: AAROM, Left, 10 reps, Seated Knee Flexion: AAROM, Left,  10 reps, Seated Goniometric ROM: L knee 5 to 60 degrees    General Comments General comments (skin integrity, edema, etc.): VSS      Pertinent Vitals/Pain Pain Assessment Pain Assessment: 0-10 Pain Score: 6  Pain Location: L  knee and LE Pain Descriptors / Indicators: Discomfort, Sore Pain Intervention(s): Limited activity within patient's tolerance, Monitored during session, Repositioned, Ice applied    Home Living                          Prior Function            PT Goals (current goals can now  be found in the care plan section) Progress towards PT goals: Progressing toward goals    Frequency    7X/week      PT Plan      Co-evaluation              AM-PAC PT 6 Clicks Mobility   Outcome Measure  Help needed turning from your back to your side while in a flat bed without using bedrails?: A Little Help needed moving from lying on your back to sitting on the side of a flat bed without using bedrails?: A Little Help needed moving to and from a bed to a chair (including a wheelchair)?: A Little Help needed standing up from a chair using your arms (e.g., wheelchair or bedside chair)?: A Little Help needed to walk in hospital room?: A Little Help needed climbing 3-5 steps with a railing? : Total 6 Click Score: 16    End of Session Equipment Utilized During Treatment: Gait belt Activity Tolerance: Patient tolerated treatment well Patient left: with call bell/phone within reach;with family/visitor present;in bed;with bed alarm set;with SCD's reapplied Nurse Communication: Mobility status PT Visit Diagnosis: Unsteadiness on feet (R26.81);Other abnormalities of gait and mobility (R26.89);Muscle weakness (generalized) (M62.81);Ataxic gait (R26.0);Difficulty in walking, not elsewhere classified (R26.2);Pain Pain - Right/Left: Left Pain - part of body: Leg;Knee     Time: 8351-8280 PT Time Calculation (min) (ACUTE ONLY): 31 min  Charges:    $Gait Training: 8-22 mins $Therapeutic Exercise: 8-22 mins PT General Charges $$ ACUTE PT VISIT: 1 Visit                     Benjiman, PT Acute Rehab Services [facility] [phone]    Benjiman VEAR Mulberry 08/19/2023, 5:30 PM

## 2023-08-19 NOTE — TOC Transition Note (Signed)
 Transition of Care [facility]) - Discharge Note   Patient Details  Name: Jordan [name]. MRN: [id number] Date of Birth: 02-Jan-1946  [facility] CM/SW Contact:  NORMAN ASPEN, LCSW Phone Number: 08/19/2023, 9:57 AM   Clinical Narrative:     Met with pt who confirms he has needed DME in the home. OPPT already arranged with [facility].  No further TOC needs.  Final next level of care: OP Rehab Barriers to Discharge: No Barriers Identified   Patient Goals and CMS Choice Patient states their goals for this hospitalization and ongoing recovery are:: return home          Discharge Placement                       Discharge Plan and Services Additional resources added to the After Visit Summary for                  DME Arranged: N/A DME Agency: NA                  Social Drivers of Health (SDOH) Interventions SDOH Screenings   Food Insecurity: No Food Insecurity (08/18/2023)  Housing: Low Risk  (08/18/2023)  Transportation Needs: No Transportation Needs (08/18/2023)  Utilities: Not At Risk (08/18/2023)  Alcohol Screen: Low Risk  (02/12/2021)  Depression (PHQ2-9): Low Risk  (02/12/2021)  Financial Resource Strain: Low Risk  (02/12/2021)  Physical Activity: Inactive (02/12/2021)  Social Connections: Moderately Integrated (08/18/2023)  Stress: No Stress Concern Present (02/12/2021)  Tobacco Use: Medium Risk (08/18/2023)     Readmission Risk Interventions     No data to display

## 2023-08-19 NOTE — Discharge Summary (Addendum)
 Patient ID: Jordan [name]. MRN: [id number] DOB/AGE: 1946/01/23 78 y.o.  Admit date: 08/18/2023 Discharge date: 08/20/2023  Admission Diagnoses:  Principal Problem:   S/P total knee arthroplasty, left   Discharge Diagnoses:  Same  Past Medical History:  Diagnosis Date   Arthritis    all over oa   CAD (coronary artery disease)    Cancer (HCC)    Prostate   Colon polyps 01/23/2010   Descending   Diverticulosis    dm type 2    Eczema 09/05/2020   Gastric ulcer 2010   GI bleed 2010   Hyperlipidemia    Hypertension    Hypotension    transient   Neuropathy 09/05/2020   both hands and feet   OSA (obstructive sleep apnea)    Severe, on CPAP therapy- no longer uses cpap per pt   Prostate cancer (HCC)    Wears glasses 09/05/2020    Surgeries: Procedure(s): Left ARTHROPLASTY, KNEE, TOTAL on 08/18/2023   Consultants:   Discharged Condition: Improved  Hospital Course: Tysin Salada. is an 78 y.o. male who was admitted 08/18/2023 for operative treatment ofS/P total knee arthroplasty, left. Patient has severe unremitting pain that affects sleep, daily activities, and work/hobbies. After pre-op clearance the patient was taken to the operating room on 08/18/2023 and underwent  Procedure(s): Left ARTHROPLASTY, KNEE, TOTAL.    Patient was given perioperative antibiotics:  Anti-infectives (From admission, onward)    Start     Dose/Rate Route Frequency Ordered Stop   08/18/23 0630  ceFAZolin  (ANCEF ) IVPB 2g/100 mL premix        2 g 200 mL/hr over 30 Minutes Intravenous On call to O.R. 08/18/23 9384 08/18/23 0741        Patient was given sequential compression devices, early ambulation, and chemoprophylaxis to prevent DVT.  The patient made slow progress with physical therapy and did not feel he could be discharged home on postop day #1 due to safety issues.  Patient benefited maximally from hospital stay and there were no complications.    Recent vital signs: Patient Vitals  for the past 24 hrs:  BP Temp Temp src Pulse Resp SpO2  08/19/23 0538 129/69 98.5 F (36.9 C) Oral 77 18 97 %  08/19/23 0151 125/74 98.5 F (36.9 C) Oral 73 18 95 %  08/18/23 2130 129/75 98.1 F (36.7 C) Oral 65 18 96 %  08/18/23 1824 118/69 (!) 97.5 F (36.4 C) Oral (!) 52 16 97 %  08/18/23 1422 119/64 (!) 97.3 F (36.3 C) Oral (!) 53 18 97 %  08/18/23 1208 115/64 98.1 F (36.7 C) -- (!) 59 20 100 %  08/18/23 1130 117/68 -- -- 60 18 97 %  08/18/23 1115 108/63 -- -- 72 14 96 %  08/18/23 1100 (!) 118/54 98.2 F (36.8 C) -- 71 (!) 21 96 %  08/18/23 1045 (!) 102/51 -- -- 62 16 96 %  08/18/23 1030 (!) 105/58 -- -- 83 18 97 %  08/18/23 1019 (!) 110/57 98 F (36.7 C) -- 68 16 97 %     Recent laboratory studies: No results for input(s): WBC, HGB, HCT, PLT, NA, K, CL, CO2, BUN, CREATININE, GLUCOSE, INR, CALCIUM  in the last 72 hours.  Invalid input(s): PT, 2   Discharge Medications:   Allergies as of 08/19/2023       Reactions   Codeine Itching, Rash   Statins Other (See Comments)   Muscle aches, tolerates simvastatin   Medication List     STOP taking these medications    aspirin  81 MG tablet Replaced by: aspirin  EC 325 MG tablet       TAKE these medications    Accu-Chek Guide test strip Generic drug: glucose blood TEST AS DIRECTED ONCEODAILY.M   glucose blood test strip Test blood sugar 2 (two) times daily.   acetaminophen  650 MG CR tablet Commonly known as: TYLENOL  Take 650-1,300 mg by mouth every 8 (eight) hours as needed. Pt takes 2 tablets in the morning and 2 tablets in the evening   aspirin  EC 325 MG tablet Take 1 tablet (325 mg total) by mouth 2 (two) times daily. Replaces: aspirin  81 MG tablet   CALCIUM  600 + D PO Take 2 tablets by mouth every evening.   carvedilol  3.125 MG tablet Commonly known as: COREG  Take 1 tablet (3.125 mg total) by mouth 2 (two) times daily with a meal. What changed: when to take  this   CINNAMON PO Take 1 tablet by mouth 2 (two) times daily.   CoQ10 100 MG Caps Take 100 mg by mouth daily.   ezetimibe  10 MG tablet Commonly known as: ZETIA  Take 1 tablet (10 mg total) by mouth daily.   Geritol Liqd Take 1 Dose by mouth every evening.   HYDROmorphone  2 MG tablet Commonly known as: Dilaudid  Take 1 tablet (2 mg total) by mouth every 4 (four) hours as needed for severe pain (pain score 7-10).   ketoconazole 2 % cream Commonly known as: NIZORAL Apply 1 application  topically daily as needed (eczema).   lisinopril  5 MG tablet Commonly known as: ZESTRIL  Take 1 tablet (5 mg total) by mouth daily.   meclizine  25 MG tablet Commonly known as: ANTIVERT  Take 25 mg by mouth daily.   METAMUCIL PO Take 1 Dose by mouth every evening. 1 dose = 1 heaping tablespoon   metFORMIN  500 MG tablet Commonly known as: GLUCOPHAGE  Take 500 mg by mouth daily with breakfast.   Mounjaro  5 MG/0.5ML Pen Generic drug: tirzepatide  Inject 5 mg into the skin every 7 (seven) days.   Mounjaro  7.5 MG/0.5ML Pen Generic drug: tirzepatide  Inject 7.5 mg into the skin once a week.   OVER THE COUNTER MEDICATION Take 2 capsules by mouth daily. Hemp Gummies   pantoprazole  40 MG tablet Commonly known as: PROTONIX  Take 1 tablet (40 mg total) by mouth daily.   polyethylene glycol powder 17 GM/SCOOP powder Commonly known as: GLYCOLAX /MIRALAX  Take 17 g by mouth every evening.   Prevagen Extra Strength 20 MG Caps Generic drug: Apoaequorin Take 20 mg by mouth every evening.   simvastatin  40 MG tablet Commonly known as: ZOCOR  Take 1 tablet (40 mg total) by mouth at bedtime.   tamsulosin  0.4 MG Caps capsule Commonly known as: Flomax  Take 1 capsule (0.4 mg total) by mouth daily after supper.   tiZANidine  2 MG tablet Commonly known as: ZANAFLEX  Take 1 tablet (2 mg total) by mouth every 6 (six) hours as needed.   TRUEplus Lancets 33G Misc Test blood sugar 2 (two) times daily.    Turmeric 500 MG Caps Take 1,000 mg by mouth daily.   vitamin C 1000 MG tablet Take 1,000 mg by mouth 2 (two) times daily.   Vitamin D3 Maximum Strength 125 MCG (5000 UT) capsule Generic drug: Cholecalciferol Take 10,000 Units by mouth daily.               Durable Medical Equipment  (From admission, onward)  Start     Ordered   08/18/23 1213  DME [place] rolling  Once       Question:  Patient needs a Charrier to treat with the following condition  Answer:  Status post total left knee replacement   08/18/23 1212   08/18/23 1213  DME 3 n 1  Once        08/18/23 1212            Diagnostic Studies: DG Chest 2 View Result Date: 08/05/2023 CLINICAL DATA:  Preop chest x-ray for upcoming knee replacement. EXAM: CHEST - 2 VIEW COMPARISON:  08/15/2020 FINDINGS: The cardiomediastinal contours are normal. The lungs are clear. Pulmonary vasculature is normal. No consolidation, pleural effusion, or pneumothorax. No acute osseous abnormalities are seen. Bilateral shoulder arthropathy. Anterior spurring in the midthoracic spine. IMPRESSION: No active cardiopulmonary disease. Electronically Signed   By: Andrea Gasman M.D.   On: 08/05/2023 13:28    Disposition: Discharge disposition: 01-Home or Self Care       Discharge Instructions     Call MD / Call 911   Complete by: As directed    If you experience chest pain or shortness of breath, CALL 911 and be transported to the hospital emergency room.  If you develope a fever above 101 F, pus (white drainage) or increased drainage or redness at the wound, or calf pain, call your surgeon's office.   Constipation Prevention   Complete by: As directed    Drink plenty of fluids.  Prune juice may be helpful.  You may use a stool softener, such as Colace (over the counter) 100 mg twice a day.  Use MiraLax  (over the counter) for constipation as needed.   Diet general   Complete by: As directed    Do not put a pillow under the  knee. Place it under the heel.   Complete by: As directed    Increase activity slowly as tolerated   Complete by: As directed    Post-operative opioid taper instructions:   Complete by: As directed    POST-OPERATIVE OPIOID TAPER INSTRUCTIONS: It is important to wean off of your opioid medication as soon as possible. If you do not need pain medication after your surgery it is ok to stop day one. Opioids include: Codeine, Hydrocodone (Norco, Vicodin), Oxycodone (Percocet, oxycontin ) and hydromorphone  amongst others.  Long term and even short term use of opiods can cause: Increased pain response Dependence Constipation Depression Respiratory depression And more.  Withdrawal symptoms can include Flu like symptoms Nausea, vomiting And more Techniques to manage these symptoms Hydrate well Eat regular healthy meals Stay active Use relaxation techniques(deep breathing, meditating, yoga) Do Not substitute Alcohol to help with tapering If you have been on opioids for less than two weeks and do not have pain than it is ok to stop all together.  Plan to wean off of opioids This plan should start within one week post op of your joint replacement. Maintain the same interval or time between taking each dose and first decrease the dose.  Cut the total daily intake of opioids by one tablet each day Next start to increase the time between doses. The last dose that should be eliminated is the evening dose.      TED hose   Complete by: As directed    Use stockings (TED hose) for 2 weeks on both leg(s).  You may remove them at night for sleeping.        Follow-up Information  Yvone Rush, MD. Krista on 08/28/2023.   Specialty: Orthopedic Surgery Why: Your appointment is scheduled for 10:30 Contact information: [address] [place] [address] [phone]         [facility] Go on 08/21/2023.   Why: Your outpatient physical therapy  has been scheduled. They will call you with a time Contact information: [address] [place] [address] [phone]                  Signed: Lynwood KANDICE Reining 08/19/2023, 8:20 AM

## 2023-08-19 NOTE — Progress Notes (Signed)
 Subjective: 1 Day Post-Op Procedure(s) (LRB): ARTHROPLASTY, KNEE, TOTAL (Left) Patient reports pain as mild.  Patient sitting up on side of bed eating breakfast.  Daughter at bedside.  Objective: Vital signs in last 24 hours: Temp:  [97.3 F (36.3 C)-98.5 F (36.9 C)] 98.5 F (36.9 C) (06/24 0538) Pulse Rate:  [52-83] 77 (06/24 0538) Resp:  [14-21] 18 (06/24 0538) BP: (102-129)/(51-75) 129/69 (06/24 0538) SpO2:  [95 %-100 %] 97 % (06/24 0538)  Intake/Output from previous day: 06/23 0701 - 06/24 0700 In: 3293.5 [P.O.:490; I.V.:2703.5; IV Piggyback:100] Out: 3050 [Urine:2900; Blood:150] Intake/Output this shift: No intake/output data recorded.  No results for input(s): HGB in the last 72 hours. No results for input(s): WBC, RBC, HCT, PLT in the last 72 hours. No results for input(s): NA, K, CL, CO2, BUN, CREATININE, GLUCOSE, CALCIUM  in the last 72 hours. No results for input(s): LABPT, INR in the last 72 hours. Left knee exam: Neurovascular intact Sensation intact distally Intact pulses distally Dorsiflexion/Plantar flexion intact Incision: dressing C/D/I No cellulitis present Compartment soft   Assessment/Plan: 1 Day Post-Op Procedure(s) (LRB): ARTHROPLASTY, KNEE, TOTAL (Left) Plan: Up with PT this morning.  Weight-bear as tolerated on left lower extremity. Aspirin 325 mg enteric-coated twice daily x 1 month postop for DVT prophylaxis. Discharge home today after physical therapy.  He is set up for outpatient physical therapy on Thursday near his home. Follow-up with Dr. Yvone in 2 weeks.    Patient's anticipated LOS is less than 2 midnights, meeting these requirements: - Lives within 1 hour of care - Has a competent adult at home to recover with post-op recover - NO history of  - Chronic pain requiring opiods  - Diabetes  - Coronary Artery Disease  - Heart failure  - Heart attack  - Stroke  - DVT/VTE  - Cardiac arrhythmia  -  Respiratory Failure/COPD  - Renal failure  - Anemia  - Advanced Liver disease     Lynwood KANDICE Reining 08/19/2023, 8:14 AM

## 2023-08-19 NOTE — Plan of Care (Signed)
  Problem: Pain Management: Goal: Pain level will decrease with appropriate interventions Outcome: Progressing   Problem: Clinical Measurements: Goal: Postoperative complications will be avoided or minimized Outcome: Progressing   Problem: Activity: Goal: Range of joint motion will improve Outcome: Progressing   Problem: Education: Goal: Knowledge of the prescribed therapeutic regimen will improve Outcome: Progressing   Problem: Safety: Goal: Ability to remain free from injury will improve Outcome: Progressing

## 2023-08-19 NOTE — Care Management Obs Status (Signed)
 MEDICARE OBSERVATION STATUS NOTIFICATION   Patient Details  Name: Jordan [name]. MRN: [id number] Date of Birth: 1945/11/06   Medicare Observation Status Notification Given:  [name], LCSW 08/19/2023, 10:53 AM

## 2023-08-19 NOTE — Plan of Care (Signed)
  Problem: Activity: Goal: Risk for activity intolerance will decrease Outcome: Progressing   Problem: Safety: Goal: Ability to remain free from injury will improve Outcome: Progressing   Problem: Pain Managment: Goal: General experience of comfort will improve and/or be controlled Outcome: Progressing

## 2023-08-19 NOTE — Progress Notes (Signed)
 Physical Therapy Treatment Patient Details Name: Jordan [name]. MRN: [id number] DOB: [birth year] Today's Date: 08/19/2023   History of Present Illness 78 yo male presents to therapy s/p L TKA on 08/18/2023 due to failure of conservative measures. Pt PMH includes but is not limited to: OA, prostate ca, DM II, OSA,  CAD s/p cardiac cath, HLD,GERD, vertigo, CAD, diverticulosis, and HTN.    PT Comments  Pt lethargic but able to arouse and participate with therapy.  He had fair pain control that eased at rest.  Pt needing min A for transfers, ambulated 3' with RW and cues, fatigued easily.  Needs further therapy to progress prior to returning home.      If plan is discharge home, recommend the following: A little help with walking and/or transfers;A little help with bathing/dressing/bathroom;Assistance with cooking/housework;Help with stairs or ramp for entrance;Assist for transportation   Can travel by private vehicle        Equipment Recommendations  None recommended by PT    Recommendations for Other Services       Precautions / Restrictions Precautions Precautions: Knee;Fall Restrictions Weight Bearing Restrictions Per Provider Order: Yes LLE Weight Bearing Per Provider Order: Weight bearing as tolerated     Mobility  Bed Mobility Overal bed mobility: Needs Assistance Bed Mobility: Supine to Sit     Supine to sit: Min assist     General bed mobility comments: increased time and cues    Transfers Overall transfer level: Needs assistance Equipment used: Rolling Horwitz (2 wheels) Transfers: Sit to/from Stand Sit to Stand: Min assist           General transfer comment: min cues for hand placement with min A to rise    Ambulation/Gait Ambulation/Gait assistance: Contact guard assist Gait Distance (Feet): 35 Feet Assistive device: Rolling Shabazz (2 wheels) Gait Pattern/deviations: Step-to pattern, Antalgic, Trunk flexed Gait velocity: decreased     General Gait  Details: Frequent cues for RW proximity and posture.  Daughter reports pt does hunch over some at baseline.  With fatigue pt kept getting RW further forward and difficulty recovering so cued for rest break; had chair follow   Stairs             Wheelchair Mobility     Tilt Bed    Modified Rankin (Stroke Patients Only)       Balance Overall balance assessment: Needs assistance Sitting-balance support: Feet supported Sitting balance-Leahy Scale: Good     Standing balance support: Bilateral upper extremity supported, During functional activity, Reliant on assistive device for balance Standing balance-Leahy Scale: Poor                              Communication    Cognition Arousal: Alert Behavior During Therapy: WFL for tasks assessed/performed   PT - Cognitive impairments: No apparent impairments                                Cueing    Exercises Total Joint Exercises Ankle Circles/Pumps: AROM, Both, 10 reps, Supine Quad Sets: AROM, Both, 10 reps, Supine Heel Slides: AAROM, Left, 10 reps, Supine Hip ABduction/ADduction: AAROM, Left, 10 reps, Supine Goniometric ROM: L knee 5 to 50 degrees    General Comments General comments (skin integrity, edema, etc.): VSS      Pertinent Vitals/Pain Pain Assessment Pain Assessment: 0-10 Pain Score: 6  (w  activity) Pain Location: L  knee and LE Pain Descriptors / Indicators: Discomfort, Sore Pain Intervention(s): Limited activity within patient's tolerance, Monitored during session, Premedicated before session, Repositioned    Home Living                          Prior Function            PT Goals (current goals can now be found in the care plan section) Progress towards PT goals: Progressing toward goals    Frequency    7X/week      PT Plan      Co-evaluation              AM-PAC PT 6 Clicks Mobility   Outcome Measure  Help needed turning from your back to  your side while in a flat bed without using bedrails?: A Little Help needed moving from lying on your back to sitting on the side of a flat bed without using bedrails?: A Little Help needed moving to and from a bed to a chair (including a wheelchair)?: A Little Help needed standing up from a chair using your arms (e.g., wheelchair or bedside chair)?: A Little Help needed to walk in hospital room?: A Little Help needed climbing 3-5 steps with a railing? : Total 6 Click Score: 16    End of Session Equipment Utilized During Treatment: Gait belt Activity Tolerance: Patient tolerated treatment well Patient left: in chair;with chair alarm set;with call bell/phone within reach;with family/visitor present Nurse Communication: Mobility status PT Visit Diagnosis: Unsteadiness on feet (R26.81);Other abnormalities of gait and mobility (R26.89);Muscle weakness (generalized) (M62.81);Ataxic gait (R26.0);Difficulty in walking, not elsewhere classified (R26.2);Pain Pain - Right/Left: Left Pain - part of body: Leg;Knee     Time: 8886-8863 PT Time Calculation (min) (ACUTE ONLY): 23 min  Charges:    $Gait Training: 8-22 mins $Therapeutic Exercise: 8-22 mins PT General Charges $$ ACUTE PT VISIT: 1 Visit                     Benjiman, PT Acute Rehab [facility] [phone]   Benjiman VEAR Mulberry 08/19/2023, 2:14 PM

## 2023-08-20 DIAGNOSIS — M1712 Unilateral primary osteoarthritis, left knee: Secondary | ICD-10-CM | POA: Diagnosis not present

## 2023-08-20 LAB — GLUCOSE, CAPILLARY
Glucose-Capillary: 107 mg/dL — ABNORMAL HIGH (ref 70–99)
Glucose-Capillary: 117 mg/dL — ABNORMAL HIGH (ref 70–99)
Glucose-Capillary: 115 mg/dL — ABNORMAL HIGH (ref 70–99)
Glucose-Capillary: 132 mg/dL — ABNORMAL HIGH (ref 70–99)

## 2023-08-20 NOTE — Progress Notes (Signed)
 Physical Therapy Treatment Patient Details Name: Jordan [name]. MRN: [id number] DOB: 1945/07/23 Today's Date: 08/20/2023   History of Present Illness 78 yo male presents to therapy s/p L TKA on 08/18/2023 due to failure of conservative measures. Pt PMH includes but is not limited to: OA, prostate ca, DM II, OSA,  CAD s/p cardiac cath, HLD,GERD, vertigo, CAD, diverticulosis, and HTN.    PT Comments  Pt continues to demonstrate gradual progress.  Needs increased time and min A for transfers.  He ambulated 25' with CGA and no LOB.  Also, progressed to up/down 6 steps.   Does still have some instability but no overt LOB this afternoon and stood from bed with CGA.  NT did report increased difficulty standing from chair earlier when fatigued.  Pt would continue to benefit from therapy prior to return home.  If continues to progress  -hopeful to be able to return home tomorrow with family.     If plan is discharge home, recommend the following: A little help with walking and/or transfers;A little help with bathing/dressing/bathroom;Assistance with cooking/housework;Help with stairs or ramp for entrance;Assist for transportation   Can travel by private vehicle        Equipment Recommendations  None recommended by PT    Recommendations for Other Services       Precautions / Restrictions Precautions Precautions: Knee;Fall Restrictions LLE Weight Bearing Per Provider Order: Weight bearing as tolerated     Mobility  Bed Mobility Overal bed mobility: Needs Assistance Bed Mobility: Supine to Sit, Sit to Supine     Supine to sit: Min assist, Used rails, HOB elevated     General bed mobility comments: Min A but increased time, effort, and cues (does have option to sleep in reclienr if needed)    Transfers Overall transfer level: Needs assistance Equipment used: Rolling [place] (2 wheels) Transfers: Sit to/from Stand Sit to Stand: From elevated surface, Contact guard assist            General transfer comment: CGA with cues for hand placement and posture from elevated bed; increased time and effort to rise but did with CGA. Nurse tech did report required increased assist, cues, and multiple attempts to stand from chair earlier    Ambulation/Gait Ambulation/Gait assistance: Contact guard assist Gait Distance (Feet): 60 Feet Assistive device: Rolling [place] (2 wheels) Gait Pattern/deviations: Step-to pattern, Antalgic, Trunk flexed, Shuffle Gait velocity: decreased     General Gait Details: Chair follow for safety; improved RW proximity but still wtih flexed posture needing cues to correct as able.  Performed 2 turns with cues for RW and small steps.  Pt does have some instability - requiring constant CGA but no LOB this afternoon   Stairs Stairs: Yes Stairs assistance: Contact guard assist Stair Management: Two rails, Step to pattern, Forwards Number of Stairs: 5 General stair comments: Performed up and down 4 steps x 3 and 6 steps x 2; CGA for safety; multiple cues for sequencing   Wheelchair Mobility     Tilt Bed    Modified Rankin (Stroke Patients Only)       Balance Overall balance assessment: Needs assistance Sitting-balance support: Feet supported Sitting balance-Leahy Scale: Good     Standing balance support: Bilateral upper extremity supported, During functional activity, Reliant on assistive device for balance, Single extremity supported Standing balance-Leahy Scale: Poor Standing balance comment: RW to ambulate; single UE supprot with ADLs  Communication    Cognition Arousal: Alert Behavior During Therapy: WFL for tasks assessed/performed   PT - Cognitive impairments: No apparent impairments                                Cueing    Exercises Total Joint Exercises Ankle Circles/Pumps: AROM, Both, 10 reps, Supine Quad Sets: AROM, Both, 10 reps, Supine Heel Slides: AAROM, Left, 10  reps, Supine Hip ABduction/ADduction: AAROM, Left, 10 reps, Supine Long Arc Quad: AAROM, Left, 10 reps, Seated Knee Flexion: AAROM, Left, 10 reps, Seated Goniometric ROM: L knee 5 to 70 degrees    General Comments General comments (skin integrity, edema, etc.): Daughter present      Pertinent Vitals/Pain Pain Assessment Pain Assessment: 0-10 Pain Score: 3  Pain Location: L  knee Pain Descriptors / Indicators: Discomfort, Sore Pain Intervention(s): Limited activity within patient's tolerance, Monitored during session, Premedicated before session, Repositioned, Ice applied    Home Living                          Prior Function            PT Goals (current goals can now be found in the care plan section) Progress towards PT goals: Progressing toward goals    Frequency    7X/week      PT Plan      Co-evaluation              AM-PAC PT 6 Clicks Mobility   Outcome Measure  Help needed turning from your back to your side while in a flat bed without using bedrails?: A Little Help needed moving from lying on your back to sitting on the side of a flat bed without using bedrails?: A Little Help needed moving to and from a bed to a chair (including a wheelchair)?: A Little Help needed standing up from a chair using your arms (e.g., wheelchair or bedside chair)?: A Little Help needed to walk in hospital room?: A Little Help needed climbing 3-5 steps with a railing? : A Lot 6 Click Score: 17    End of Session Equipment Utilized During Treatment: Gait belt Activity Tolerance: Patient tolerated treatment well Patient left: with call bell/phone within reach;with family/visitor present;in chair Nurse Communication: Mobility status PT Visit Diagnosis: Unsteadiness on feet (R26.81);Other abnormalities of gait and mobility (R26.89);Muscle weakness (generalized) (M62.81);Ataxic gait (R26.0);Difficulty in walking, not elsewhere classified (R26.2);Pain Pain -  Right/Left: Left Pain - part of body: Leg;Knee     Time: 8471-8393 PT Time Calculation (min) (ACUTE ONLY): 38 min  Charges:    $Gait Training: 8-22 mins $Therapeutic Exercise: 8-22 mins $Therapeutic Activity: 8-22 mins PT General Charges $$ ACUTE PT VISIT: 1 Visit                     Benjiman, PT Acute Rehab [facility] [phone]    Benjiman VEAR Mulberry 08/20/2023, 5:22 PM

## 2023-08-20 NOTE — Progress Notes (Signed)
 Physical Therapy Treatment Patient Details Name: Jordan [name]. MRN: [id number] DOB: 05-02-1945 Today's Date: 08/20/2023   History of Present Illness 78 yo male presents to therapy s/p L TKA on 08/18/2023 due to failure of conservative measures. Pt PMH includes but is not limited to: OA, prostate ca, DM II, OSA,  CAD s/p cardiac cath, HLD,GERD, vertigo, CAD, diverticulosis, and HTN.    PT Comments  Pt progressing but slowly.  He continues to need assist for transfers and demonstrates some mild instability/risk of falling.  He did ambulate 16' and performed stairs with min A.  Will f/u in afternoon for further therapy ;however, pt likely to need further therapy prior to being able to return home safely.    If plan is discharge home, recommend the following: A little help with walking and/or transfers;A little help with bathing/dressing/bathroom;Assistance with cooking/housework;Help with stairs or ramp for entrance;Assist for transportation   Can travel by private vehicle        Equipment Recommendations  None recommended by PT    Recommendations for Other Services       Precautions / Restrictions Precautions Precautions: Knee;Fall Restrictions LLE Weight Bearing Per Provider Order: Weight bearing as tolerated     Mobility  Bed Mobility Overal bed mobility: Needs Assistance Bed Mobility: Supine to Sit     Supine to sit: Min assist, Used rails, HOB elevated     General bed mobility comments: Min A but increased time, effort, and cues (does have option to sleep in reclienr if needed)    Transfers Overall transfer level: Needs assistance Equipment used: Rolling [place] (2 wheels) Transfers: Sit to/from Stand Sit to Stand: Min assist, From elevated surface           General transfer comment: min cues for hand placement with min A to rise from elevated surface with some instability; increased time; performed x 2 ; does have urinary urgency (able to get urinal today)     Ambulation/Gait Ambulation/Gait assistance: Contact guard assist, Min assist Gait Distance (Feet): 60 Feet Assistive device: Rolling [place] (2 wheels) Gait Pattern/deviations: Step-to pattern, Antalgic, Trunk flexed, Shuffle Gait velocity: decreased     General Gait Details: Chair follow for safety; improved RW proximity but still wtih flexed posture needing cues to correct as able.  Performed 2 turns with cues for RW and small steps.  Pt does have some instability - requiring constant CGA and 2 episodes of min A with first few steps and when turning   Stairs Stairs: Yes Stairs assistance: Min assist Stair Management: Two rails, Step to pattern, Forwards Number of Stairs: 3 General stair comments: Went up 4 steps x 3 and down 6 steps x 2.  Son reports pt's steps likely 4 in height. Pt performed with cues and min A.   Wheelchair Mobility     Tilt Bed    Modified Rankin (Stroke Patients Only)       Balance Overall balance assessment: Needs assistance Sitting-balance support: Feet supported Sitting balance-Leahy Scale: Good     Standing balance support: Bilateral upper extremity supported, During functional activity, Reliant on assistive device for balance, Single extremity supported Standing balance-Leahy Scale: Poor Standing balance comment: RW to ambulate; single UE supprot with ADLs                            Communication    Cognition Arousal: Alert Behavior During Therapy: WFL for tasks assessed/performed   PT -  Cognitive impairments: No apparent impairments                                Cueing    Exercises Total Joint Exercises Ankle Circles/Pumps: AROM, Both, 10 reps, Supine Quad Sets: AROM, Both, 10 reps, Supine Heel Slides: AAROM, Left, 10 reps, Supine Hip ABduction/ADduction: AAROM, Left, 10 reps, Supine Long Arc Quad: AAROM, Left, 10 reps, Seated Knee Flexion: AAROM, Left, 10 reps, Seated Goniometric ROM: L knee 5 to 70  degrees    General Comments General comments (skin integrity, edema, etc.): Son present .  Pt will be at home with wife but kids will be assisting initially as needed.  In regards to further therapy - pt expressed that he is not rushing discharge and wants to make sure safe and will not fall at home.      Pertinent Vitals/Pain Pain Assessment Pain Assessment: 0-10 Pain Score: 3  Pain Location: L  knee Pain Descriptors / Indicators: Discomfort, Sore Pain Intervention(s): Limited activity within patient's tolerance, Monitored during session, Premedicated before session, Repositioned, Ice applied    Home Living                          Prior Function            PT Goals (current goals can now be found in the care plan section) Progress towards PT goals: Progressing toward goals    Frequency    7X/week      PT Plan      Co-evaluation              AM-PAC PT 6 Clicks Mobility   Outcome Measure  Help needed turning from your back to your side while in a flat bed without using bedrails?: A Little Help needed moving from lying on your back to sitting on the side of a flat bed without using bedrails?: A Little Help needed moving to and from a bed to a chair (including a wheelchair)?: A Little Help needed standing up from a chair using your arms (e.g., wheelchair or bedside chair)?: A Little Help needed to walk in hospital room?: A Little Help needed climbing 3-5 steps with a railing? : A Lot 6 Click Score: 17    End of Session Equipment Utilized During Treatment: Gait belt Activity Tolerance: Patient tolerated treatment well Patient left: with call bell/phone within reach;with family/visitor present;in chair Nurse Communication: Mobility status PT Visit Diagnosis: Unsteadiness on feet (R26.81);Other abnormalities of gait and mobility (R26.89);Muscle weakness (generalized) (M62.81);Ataxic gait (R26.0);Difficulty in walking, not elsewhere classified  (R26.2);Pain Pain - Right/Left: Left Pain - part of body: Leg;Knee     Time: 8951-8872 PT Time Calculation (min) (ACUTE ONLY): 39 min  Charges:    $Gait Training: 8-22 mins $Therapeutic Exercise: 8-22 mins $Therapeutic Activity: 8-22 mins PT General Charges $$ ACUTE PT VISIT: 1 Visit                     Benjiman, PT Acute Rehab [facility] [phone]    Benjiman VEAR [place]berry 08/20/2023, 11:52 AM

## 2023-08-20 NOTE — Progress Notes (Signed)
 Subjective: 2 Days Post-Op Procedure(s) (LRB): ARTHROPLASTY, KNEE, TOTAL (Left) Patient reports pain as 3 on 0-10 scale.  Taking by mouth and voiding okay.  Progressing slowly with physical therapy.  We did not feel like the patient could be discharged home safely yesterday so he was kept an additional night.  Objective: Vital signs in last 24 hours: Temp:  [98.7 F (37.1 C)-100.2 F (37.9 C)] 100.2 F (37.9 C) (06/24 2158) Pulse Rate:  [64-79] 79 (06/24 2158) Resp:  [16-17] 17 (06/24 2158) BP: (100-135)/(55-79) 135/67 (06/24 2158) SpO2:  [95 %-98 %] 95 % (06/24 2158)  Intake/Output from previous day: 06/24 0701 - 06/25 0700 In: 720 [P.O.:720] Out: 2450 [Urine:2450] Intake/Output this shift: No intake/output data recorded.  No results for input(s): HGB in the last 72 hours. No results for input(s): WBC, RBC, HCT, PLT in the last 72 hours. No results for input(s): NA, K, CL, CO2, BUN, CREATININE, GLUCOSE, CALCIUM  in the last 72 hours. No results for input(s): LABPT, INR in the last 72 hours. Left knee exam: Neurovascular intact Sensation intact distally Intact pulses distally Dorsiflexion/Plantar flexion intact Incision: dressing C/D/I No cellulitis present Compartment soft   Assessment/Plan: 2 Days Post-Op Procedure(s) (LRB): ARTHROPLASTY, KNEE, TOTAL (Left) Plan: Discharge home today after physical therapy. Continue aspirin 325 mg enteric-coated twice daily x 1 month postop for DVT prophylaxis. Weight-bear as tolerated on left lower extremity. Follow-up with Dr. Yvone in 2 weeks.   Anticipated LOS equal to or greater than 2 midnights due to - Age 80 and older with one or more of the following:  - Obesity  - Expected need for hospital services (PT, OT, Nursing) required for safe  discharge  - Anticipated need for postoperative skilled nursing care or inpatient rehab  - Active co-morbidities: None OR   - Unanticipated findings  during/Post Surgery: Slow post-op progression: GI, pain control, mobility  - Patient is a high risk of re-admission due to: None   Jordan [name] 08/20/2023, 7:42 AM

## 2023-08-21 DIAGNOSIS — M1712 Unilateral primary osteoarthritis, left knee: Secondary | ICD-10-CM | POA: Diagnosis not present

## 2023-08-21 LAB — GLUCOSE, CAPILLARY
Glucose-Capillary: 111 mg/dL — ABNORMAL HIGH (ref 70–99)
Glucose-Capillary: 143 mg/dL — ABNORMAL HIGH (ref 70–99)

## 2023-08-21 NOTE — Care Plan (Signed)
 Ortho Bundle Case Management Note  Patient Details  Name: Jordan [name]. MRN: [id number] Date of Birth: [birth year]  Patient has been slow to progress with PT and medical issues. Will add HHPT and move his OPPT appointment. HHPT referral sent to [facility]                     DME Arranged:  N/A DME Agency:  NA  HH Arranged:  PT HH Agency:  [facility]  Additional Comments: Please contact me with any questions of if this plan should need to change.  Charlies Pitch,  RN,BSN,MHA,CCM  [facility]  [phone] 08/21/2023, 11:44 AM

## 2023-08-21 NOTE — Progress Notes (Signed)
 Physical Therapy Treatment Patient Details Name: Jordan [name]. MRN: [id number] DOB: 05/02/1945 Today's Date: 08/21/2023   History of Present Illness 78 yo male presents to therapy s/p L TKA on 08/18/2023 due to failure of conservative measures. Pt PMH includes but is not limited to: OA, prostate ca, DM II, OSA,  CAD s/p cardiac cath, HLD,GERD, vertigo, CAD, diverticulosis, and HTN.    PT Comments  Pt slow with mobilizing however only CGA assist today.  Pt ambulated in hallway and returned to room to sit in recliner.  Pt hopeful to have BM soon and would like to practice stairs for second session (anticipating d/c home today).     If plan is discharge home, recommend the following: A little help with walking and/or transfers;A little help with bathing/dressing/bathroom;Assistance with cooking/housework;Help with stairs or ramp for entrance;Assist for transportation   Can travel by private vehicle        Equipment Recommendations  None recommended by PT    Recommendations for Other Services       Precautions / Restrictions Precautions Precautions: Knee;Fall Restrictions LLE Weight Bearing Per Provider Order: Weight bearing as tolerated     Mobility  Bed Mobility Overal bed mobility: Needs Assistance Bed Mobility: Supine to Sit     Supine to sit: Contact guard, Used rails     General bed mobility comments: pt requested flat bed; self assisted L LE with gait belt; cues for technique    Transfers Overall transfer level: Needs assistance Equipment used: Rolling [place] (2 wheels) Transfers: Sit to/from Stand Sit to Stand: From elevated surface, Contact guard assist           General transfer comment: verbal cues for positioning; required increased time    Ambulation/Gait Ambulation/Gait assistance: Contact guard assist Gait Distance (Feet): 80 Feet Assistive device: Rolling Jr (2 wheels) Gait Pattern/deviations: Step-to pattern, Antalgic, Trunk flexed,  Decreased stance time - left Gait velocity: decreased     General Gait Details: verbal cues for sequence, RW positioning, and max cues for posture (tends to maintain trunk flexion however chronic per daughter)   Optometrist     Tilt Bed    Modified Rankin (Stroke Patients Only)       Balance                                            Communication Communication Communication: No apparent difficulties  Cognition Arousal: Alert Behavior During Therapy: WFL for tasks assessed/performed   PT - Cognitive impairments: No apparent impairments                                Cueing    Exercises      General Comments        Pertinent Vitals/Pain Pain Assessment Pain Assessment: 0-10 Pain Score: 4  Pain Location: L  knee Pain Descriptors / Indicators: Discomfort, Sore Pain Intervention(s): Monitored during session, Repositioned    Home Living                          Prior Function            PT Goals (current goals can now be found in the care plan section)  Progress towards PT goals: Progressing toward goals    Frequency    7X/week      PT Plan      Co-evaluation              AM-PAC PT 6 Clicks Mobility   Outcome Measure  Help needed turning from your back to your side while in a flat bed without using bedrails?: A Little Help needed moving from lying on your back to sitting on the side of a flat bed without using bedrails?: A Little Help needed moving to and from a bed to a chair (including a wheelchair)?: A Little Help needed standing up from a chair using your arms (e.g., wheelchair or bedside chair)?: A Little Help needed to walk in hospital room?: A Little Help needed climbing 3-5 steps with a railing? : A Lot 6 Click Score: 17    End of Session Equipment Utilized During Treatment: Gait belt Activity Tolerance: Patient tolerated treatment well Patient left:  in chair;with call bell/phone within reach;with family/visitor present Nurse Communication: Mobility status PT Visit Diagnosis: Other abnormalities of gait and mobility (R26.89)     Time: 9050-8989 PT Time Calculation (min) (ACUTE ONLY): 21 min  Charges:    $Gait Training: 8-22 mins PT General Charges $$ ACUTE PT VISIT: 1 Visit                    Tari KLEIN, DPT Physical Therapist Acute Rehabilitation Services Office: [phone]   Tari CROME Payson 08/21/2023, 5:17 PM

## 2023-08-21 NOTE — Progress Notes (Signed)
 Physical Therapy Treatment Patient Details Name: Jordan [name]. MRN: [id number] DOB: 12/28/1945 Today's Date: 08/21/2023   History of Present Illness 78 yo male presents to therapy s/p L TKA on 08/18/2023 due to failure of conservative measures. Pt PMH includes but is not limited to: OA, prostate ca, DM II, OSA,  CAD s/p cardiac cath, HLD,GERD, vertigo, CAD, diverticulosis, and HTN.    PT Comments  Pt ambulated in hallway and practiced safe stair technique.  Daughter present and observed session.  Pt and daughter had no further questions, and pt ready for d/c home today.    If plan is discharge home, recommend the following: A little help with walking and/or transfers;A little help with bathing/dressing/bathroom;Assistance with cooking/housework;Help with stairs or ramp for entrance;Assist for transportation   Can travel by private vehicle        Equipment Recommendations  None recommended by PT    Recommendations for Other Services       Precautions / Restrictions Precautions Precautions: Knee;Fall Restrictions LLE Weight Bearing Per Provider Order: Weight bearing as tolerated     Mobility  Bed Mobility     Transfers Overall transfer level: Needs assistance Equipment used: Rolling [place] (2 wheels) Transfers: Sit to/from Stand Sit to Stand: Contact guard assist           General transfer comment: verbal cues for positioning; required increased time    Ambulation/Gait Ambulation/Gait assistance: Contact guard assist Gait Distance (Feet): 80 Feet Assistive device: Rolling [place] (2 wheels) Gait Pattern/deviations: Step-to pattern, Antalgic, Trunk flexed, Decreased stance time - left Gait velocity: decreased     General Gait Details: verbal cues for sequence, RW positioning, and max cues for posture (tends to maintain trunk flexion however chronic per daughter)   Stairs Stairs: Yes Stairs assistance: Contact guard assist Stair Management: One rail Right, With  cane, Step to pattern, Forwards Number of Stairs: 3 General stair comments: CGA for safety; multiple cues for sequencing; daughter present and observed (SPC and handrail today since daughter reports both rails cannot be reached at the same time; pt does have cane at home)   Wheelchair Mobility     Tilt Bed    Modified Rankin (Stroke Patients Only)       Balance                                            Communication Communication Communication: No apparent difficulties  Cognition Arousal: Alert Behavior During Therapy: WFL for tasks assessed/performed   PT - Cognitive impairments: No apparent impairments                                Cueing    Exercises      General Comments        Pertinent Vitals/Pain Pain Assessment Pain Assessment: 0-10 Pain Score: 4  Pain Location: L  knee Pain Descriptors / Indicators: Discomfort, Sore Pain Intervention(s): Monitored during session, Repositioned    Home Living                          Prior Function            PT Goals (current goals can now be found in the care plan section) Progress towards PT goals: Progressing toward goals  Frequency    7X/week      PT Plan      Co-evaluation              AM-PAC PT 6 Clicks Mobility   Outcome Measure  Help needed turning from your back to your side while in a flat bed without using bedrails?: A Little Help needed moving from lying on your back to sitting on the side of a flat bed without using bedrails?: A Little Help needed moving to and from a bed to a chair (including a wheelchair)?: A Little Help needed standing up from a chair using your arms (e.g., wheelchair or bedside chair)?: A Little Help needed to walk in hospital room?: A Little Help needed climbing 3-5 steps with a railing? : A Little 6 Click Score: 18    End of Session Equipment Utilized During Treatment: Gait belt Activity Tolerance: Patient  tolerated treatment well Patient left: in chair;with call bell/phone within reach;with family/visitor present Nurse Communication: Mobility status PT Visit Diagnosis: Other abnormalities of gait and mobility (R26.89)     Time: 8684-8671 PT Time Calculation (min) (ACUTE ONLY): 13 min  Charges:    $Gait Training: 8-22 mins PT General Charges $$ ACUTE PT VISIT: 1 Visit                    Tari KLEIN, DPT Physical Therapist Acute Rehabilitation Services Office: [phone]    Tari CROME [place] 08/21/2023, 5:21 PM

## 2023-08-21 NOTE — Progress Notes (Signed)
 Patient's family member was observed handing purple wipes to patient to wipe himself with after voiding. Patient has been using such wipes since yesterday,per daughter. Both patient and daughter have been educated on the risk associated with the improper use of sanitation equipment. No visible injury to area of use at this time.

## 2023-08-23 DIAGNOSIS — E669 Obesity, unspecified: Secondary | ICD-10-CM | POA: Diagnosis not present

## 2023-08-23 DIAGNOSIS — E559 Vitamin D deficiency, unspecified: Secondary | ICD-10-CM | POA: Diagnosis not present

## 2023-08-23 DIAGNOSIS — Z8546 Personal history of malignant neoplasm of prostate: Secondary | ICD-10-CM | POA: Diagnosis not present

## 2023-08-23 DIAGNOSIS — Z7984 Long term (current) use of oral hypoglycemic drugs: Secondary | ICD-10-CM | POA: Diagnosis not present

## 2023-08-23 DIAGNOSIS — Z6838 Body mass index (BMI) 38.0-38.9, adult: Secondary | ICD-10-CM | POA: Diagnosis not present

## 2023-08-23 DIAGNOSIS — Z556 Problems related to health literacy: Secondary | ICD-10-CM | POA: Diagnosis not present

## 2023-08-23 DIAGNOSIS — M13862 Other specified arthritis, left knee: Secondary | ICD-10-CM | POA: Diagnosis not present

## 2023-08-23 DIAGNOSIS — Z7985 Long-term (current) use of injectable non-insulin antidiabetic drugs: Secondary | ICD-10-CM | POA: Diagnosis not present

## 2023-08-23 DIAGNOSIS — E7800 Pure hypercholesterolemia, unspecified: Secondary | ICD-10-CM | POA: Diagnosis not present

## 2023-08-23 DIAGNOSIS — E119 Type 2 diabetes mellitus without complications: Secondary | ICD-10-CM | POA: Diagnosis not present

## 2023-08-23 DIAGNOSIS — I10 Essential (primary) hypertension: Secondary | ICD-10-CM | POA: Diagnosis not present

## 2023-08-23 DIAGNOSIS — Z471 Aftercare following joint replacement surgery: Secondary | ICD-10-CM | POA: Diagnosis not present

## 2023-08-23 DIAGNOSIS — Z96642 Presence of left artificial hip joint: Secondary | ICD-10-CM | POA: Diagnosis not present

## 2023-08-23 DIAGNOSIS — Z7982 Long term (current) use of aspirin: Secondary | ICD-10-CM | POA: Diagnosis not present

## 2023-08-23 DIAGNOSIS — K219 Gastro-esophageal reflux disease without esophagitis: Secondary | ICD-10-CM | POA: Diagnosis not present

## 2023-08-28 DIAGNOSIS — M1712 Unilateral primary osteoarthritis, left knee: Secondary | ICD-10-CM | POA: Diagnosis not present

## 2023-09-01 ENCOUNTER — Other Ambulatory Visit (HOSPITAL_COMMUNITY): Payer: Self-pay

## 2023-09-01 ENCOUNTER — Other Ambulatory Visit: Payer: Self-pay | Admitting: Cardiovascular Disease

## 2023-09-03 ENCOUNTER — Telehealth: Payer: Self-pay | Admitting: Cardiovascular Disease

## 2023-09-03 ENCOUNTER — Other Ambulatory Visit: Payer: Self-pay

## 2023-09-03 MED ORDER — CARVEDILOL 3.125 MG PO TABS
3.1250 mg | ORAL_TABLET | Freq: Two times a day (BID) | ORAL | 3 refills | Status: AC
  Filled 2023-09-03 – 2023-11-07 (×2): qty 180, 90d supply, fill #0
  Filled 2024-02-02: qty 90, 45d supply, fill #0
  Filled 2024-03-10: qty 90, 45d supply, fill #1

## 2023-09-03 NOTE — Telephone Encounter (Signed)
*  STAT* If patient is at the pharmacy, call can be transferred to refill team.   1. Which medications need to be refilled? (please list name of each medication and dose if known)   carvedilol  (COREG ) 3.125 MG tablet   2. Would you like to learn more about the convenience, safety, & potential cost savings by using the [facility]?   3. Are you open to using the [facility] (Type [facility]. ).  4. Which pharmacy/location (including street and city if local pharmacy) is medication to be sent to?  [place] [facility]   5. Do they need a 30 day or 90 day supply? 90 day  Patient stated he only has 1 tablet left.

## 2023-09-03 NOTE — Telephone Encounter (Signed)
 RX sent to requested Pharmacy

## 2023-09-08 DIAGNOSIS — M25662 Stiffness of left knee, not elsewhere classified: Secondary | ICD-10-CM | POA: Diagnosis not present

## 2023-09-08 DIAGNOSIS — R2689 Other abnormalities of gait and mobility: Secondary | ICD-10-CM | POA: Diagnosis not present

## 2023-09-08 DIAGNOSIS — M6281 Muscle weakness (generalized): Secondary | ICD-10-CM | POA: Diagnosis not present

## 2023-09-08 DIAGNOSIS — M25562 Pain in left knee: Secondary | ICD-10-CM | POA: Diagnosis not present

## 2023-09-12 DIAGNOSIS — M25562 Pain in left knee: Secondary | ICD-10-CM | POA: Diagnosis not present

## 2023-09-12 DIAGNOSIS — M6281 Muscle weakness (generalized): Secondary | ICD-10-CM | POA: Diagnosis not present

## 2023-09-12 DIAGNOSIS — M25662 Stiffness of left knee, not elsewhere classified: Secondary | ICD-10-CM | POA: Diagnosis not present

## 2023-09-12 DIAGNOSIS — R2689 Other abnormalities of gait and mobility: Secondary | ICD-10-CM | POA: Diagnosis not present

## 2023-09-16 DIAGNOSIS — R2689 Other abnormalities of gait and mobility: Secondary | ICD-10-CM | POA: Diagnosis not present

## 2023-09-16 DIAGNOSIS — M25662 Stiffness of left knee, not elsewhere classified: Secondary | ICD-10-CM | POA: Diagnosis not present

## 2023-09-16 DIAGNOSIS — M6281 Muscle weakness (generalized): Secondary | ICD-10-CM | POA: Diagnosis not present

## 2023-09-16 DIAGNOSIS — M25562 Pain in left knee: Secondary | ICD-10-CM | POA: Diagnosis not present

## 2023-09-17 ENCOUNTER — Other Ambulatory Visit (HOSPITAL_COMMUNITY): Payer: Self-pay

## 2023-09-17 MED ORDER — METFORMIN HCL 500 MG PO TABS
500.0000 mg | ORAL_TABLET | Freq: Every morning | ORAL | 1 refills | Status: DC
  Filled 2023-09-17: qty 90, 90d supply, fill #0

## 2023-09-18 ENCOUNTER — Other Ambulatory Visit (HOSPITAL_COMMUNITY): Payer: Self-pay

## 2023-09-18 ENCOUNTER — Other Ambulatory Visit: Payer: Self-pay

## 2023-09-18 DIAGNOSIS — M1711 Unilateral primary osteoarthritis, right knee: Secondary | ICD-10-CM | POA: Diagnosis not present

## 2023-09-19 ENCOUNTER — Other Ambulatory Visit (HOSPITAL_COMMUNITY): Payer: Self-pay

## 2023-09-19 DIAGNOSIS — M25562 Pain in left knee: Secondary | ICD-10-CM | POA: Diagnosis not present

## 2023-09-19 DIAGNOSIS — M25662 Stiffness of left knee, not elsewhere classified: Secondary | ICD-10-CM | POA: Diagnosis not present

## 2023-09-19 DIAGNOSIS — R2689 Other abnormalities of gait and mobility: Secondary | ICD-10-CM | POA: Diagnosis not present

## 2023-09-19 DIAGNOSIS — M6281 Muscle weakness (generalized): Secondary | ICD-10-CM | POA: Diagnosis not present

## 2023-09-23 DIAGNOSIS — M25562 Pain in left knee: Secondary | ICD-10-CM | POA: Diagnosis not present

## 2023-09-23 DIAGNOSIS — M6281 Muscle weakness (generalized): Secondary | ICD-10-CM | POA: Diagnosis not present

## 2023-09-23 DIAGNOSIS — R2689 Other abnormalities of gait and mobility: Secondary | ICD-10-CM | POA: Diagnosis not present

## 2023-09-23 DIAGNOSIS — M25662 Stiffness of left knee, not elsewhere classified: Secondary | ICD-10-CM | POA: Diagnosis not present

## 2023-09-24 ENCOUNTER — Other Ambulatory Visit: Payer: Self-pay

## 2023-09-25 DIAGNOSIS — R2689 Other abnormalities of gait and mobility: Secondary | ICD-10-CM | POA: Diagnosis not present

## 2023-09-25 DIAGNOSIS — M25662 Stiffness of left knee, not elsewhere classified: Secondary | ICD-10-CM | POA: Diagnosis not present

## 2023-09-25 DIAGNOSIS — M25562 Pain in left knee: Secondary | ICD-10-CM | POA: Diagnosis not present

## 2023-09-25 DIAGNOSIS — M6281 Muscle weakness (generalized): Secondary | ICD-10-CM | POA: Diagnosis not present

## 2023-09-30 DIAGNOSIS — M25662 Stiffness of left knee, not elsewhere classified: Secondary | ICD-10-CM | POA: Diagnosis not present

## 2023-09-30 DIAGNOSIS — M25562 Pain in left knee: Secondary | ICD-10-CM | POA: Diagnosis not present

## 2023-09-30 DIAGNOSIS — R2689 Other abnormalities of gait and mobility: Secondary | ICD-10-CM | POA: Diagnosis not present

## 2023-09-30 DIAGNOSIS — M6281 Muscle weakness (generalized): Secondary | ICD-10-CM | POA: Diagnosis not present

## 2023-10-02 DIAGNOSIS — M25562 Pain in left knee: Secondary | ICD-10-CM | POA: Diagnosis not present

## 2023-10-02 DIAGNOSIS — R2689 Other abnormalities of gait and mobility: Secondary | ICD-10-CM | POA: Diagnosis not present

## 2023-10-02 DIAGNOSIS — M25662 Stiffness of left knee, not elsewhere classified: Secondary | ICD-10-CM | POA: Diagnosis not present

## 2023-10-02 DIAGNOSIS — M6281 Muscle weakness (generalized): Secondary | ICD-10-CM | POA: Diagnosis not present

## 2023-10-07 DIAGNOSIS — M6281 Muscle weakness (generalized): Secondary | ICD-10-CM | POA: Diagnosis not present

## 2023-10-07 DIAGNOSIS — M25562 Pain in left knee: Secondary | ICD-10-CM | POA: Diagnosis not present

## 2023-10-07 DIAGNOSIS — M25662 Stiffness of left knee, not elsewhere classified: Secondary | ICD-10-CM | POA: Diagnosis not present

## 2023-10-07 DIAGNOSIS — R2689 Other abnormalities of gait and mobility: Secondary | ICD-10-CM | POA: Diagnosis not present

## 2023-10-08 ENCOUNTER — Other Ambulatory Visit (HOSPITAL_COMMUNITY): Payer: Self-pay

## 2023-10-08 DIAGNOSIS — M6281 Muscle weakness (generalized): Secondary | ICD-10-CM | POA: Diagnosis not present

## 2023-10-08 DIAGNOSIS — M25562 Pain in left knee: Secondary | ICD-10-CM | POA: Diagnosis not present

## 2023-10-08 DIAGNOSIS — R2689 Other abnormalities of gait and mobility: Secondary | ICD-10-CM | POA: Diagnosis not present

## 2023-10-08 DIAGNOSIS — M25662 Stiffness of left knee, not elsewhere classified: Secondary | ICD-10-CM | POA: Diagnosis not present

## 2023-10-09 ENCOUNTER — Telehealth: Payer: Self-pay | Admitting: Cardiovascular Disease

## 2023-10-09 ENCOUNTER — Other Ambulatory Visit: Payer: Self-pay

## 2023-10-09 ENCOUNTER — Other Ambulatory Visit (HOSPITAL_COMMUNITY): Payer: Self-pay

## 2023-10-09 MED ORDER — EZETIMIBE 10 MG PO TABS
10.0000 mg | ORAL_TABLET | Freq: Every day | ORAL | 2 refills | Status: AC
  Filled 2023-10-09: qty 90, 90d supply, fill #0
  Filled 2024-01-05: qty 90, 90d supply, fill #1
  Filled 2024-03-10: qty 90, 90d supply, fill #2

## 2023-10-09 NOTE — Telephone Encounter (Signed)
 RX sent in

## 2023-10-09 NOTE — Telephone Encounter (Signed)
*  STAT* If patient is at the pharmacy, call can be transferred to refill team.   1. Which medications need to be refilled? (please list name of each medication and dose if known)   ezetimibe (ZETIA) 10 MG tablet   2. Would you like to learn more about the convenience, safety, & potential cost savings by using the [facility]?   3. Are you open to using the [facility] (Type [facility].  ).   4. Which pharmacy/location (including street and city if local pharmacy) is medication to be sent to? [place] [facility]    5. Do they need a 30 day or 90 day supply? 90 day

## 2023-10-14 ENCOUNTER — Ambulatory Visit: Admitting: Podiatry

## 2023-10-14 DIAGNOSIS — M6281 Muscle weakness (generalized): Secondary | ICD-10-CM | POA: Diagnosis not present

## 2023-10-14 DIAGNOSIS — R2689 Other abnormalities of gait and mobility: Secondary | ICD-10-CM | POA: Diagnosis not present

## 2023-10-14 DIAGNOSIS — M25662 Stiffness of left knee, not elsewhere classified: Secondary | ICD-10-CM | POA: Diagnosis not present

## 2023-10-14 DIAGNOSIS — M25562 Pain in left knee: Secondary | ICD-10-CM | POA: Diagnosis not present

## 2023-10-16 ENCOUNTER — Other Ambulatory Visit (HOSPITAL_COMMUNITY): Payer: Self-pay

## 2023-10-16 DIAGNOSIS — M25562 Pain in left knee: Secondary | ICD-10-CM | POA: Diagnosis not present

## 2023-10-16 DIAGNOSIS — M25662 Stiffness of left knee, not elsewhere classified: Secondary | ICD-10-CM | POA: Diagnosis not present

## 2023-10-16 DIAGNOSIS — R2689 Other abnormalities of gait and mobility: Secondary | ICD-10-CM | POA: Diagnosis not present

## 2023-10-16 DIAGNOSIS — M6281 Muscle weakness (generalized): Secondary | ICD-10-CM | POA: Diagnosis not present

## 2023-10-21 ENCOUNTER — Other Ambulatory Visit (HOSPITAL_COMMUNITY): Payer: Self-pay

## 2023-10-21 DIAGNOSIS — M6281 Muscle weakness (generalized): Secondary | ICD-10-CM | POA: Diagnosis not present

## 2023-10-21 DIAGNOSIS — M25562 Pain in left knee: Secondary | ICD-10-CM | POA: Diagnosis not present

## 2023-10-21 DIAGNOSIS — M25662 Stiffness of left knee, not elsewhere classified: Secondary | ICD-10-CM | POA: Diagnosis not present

## 2023-10-21 DIAGNOSIS — R2689 Other abnormalities of gait and mobility: Secondary | ICD-10-CM | POA: Diagnosis not present

## 2023-10-23 DIAGNOSIS — R2689 Other abnormalities of gait and mobility: Secondary | ICD-10-CM | POA: Diagnosis not present

## 2023-10-23 DIAGNOSIS — M25562 Pain in left knee: Secondary | ICD-10-CM | POA: Diagnosis not present

## 2023-10-23 DIAGNOSIS — M25662 Stiffness of left knee, not elsewhere classified: Secondary | ICD-10-CM | POA: Diagnosis not present

## 2023-10-23 DIAGNOSIS — M6281 Muscle weakness (generalized): Secondary | ICD-10-CM | POA: Diagnosis not present

## 2023-11-04 DIAGNOSIS — M170 Bilateral primary osteoarthritis of knee: Secondary | ICD-10-CM | POA: Diagnosis not present

## 2023-11-04 DIAGNOSIS — M1712 Unilateral primary osteoarthritis, left knee: Secondary | ICD-10-CM | POA: Diagnosis not present

## 2023-11-07 ENCOUNTER — Other Ambulatory Visit (HOSPITAL_COMMUNITY): Payer: Self-pay

## 2023-11-07 ENCOUNTER — Other Ambulatory Visit: Payer: Self-pay

## 2023-11-07 MED ORDER — GLUCOSE BLOOD VI STRP
ORAL_STRIP | 5 refills | Status: AC
  Filled 2023-11-07: qty 50, 50d supply, fill #0

## 2023-11-07 MED ORDER — LANCETS MISC
5 refills | Status: AC
  Filled 2023-11-07: qty 100, 50d supply, fill #0

## 2023-11-07 MED ORDER — CLOTRIMAZOLE-BETAMETHASONE 1-0.05 % EX CREA
1.0000 | TOPICAL_CREAM | Freq: Two times a day (BID) | CUTANEOUS | 2 refills | Status: AC
  Filled 2023-11-07: qty 60, 30d supply, fill #0
  Filled 2024-02-02: qty 60, 30d supply, fill #1

## 2023-11-07 MED ORDER — DOCUSATE SODIUM 100 MG PO CAPS: 100.0000 mg | ORAL_CAPSULE | Freq: Two times a day (BID) | ORAL | 0 refills | Status: AC

## 2023-11-07 MED ORDER — MOUNJARO 7.5 MG/0.5ML ~~LOC~~ SOAJ
7.5000 mg | SUBCUTANEOUS | 0 refills | Status: AC
  Filled 2023-11-07: qty 6, 84d supply, fill #0
  Filled 2024-01-09: qty 2, 28d supply, fill #1

## 2023-11-10 ENCOUNTER — Other Ambulatory Visit (HOSPITAL_COMMUNITY): Payer: Self-pay

## 2023-11-10 ENCOUNTER — Other Ambulatory Visit (HOSPITAL_BASED_OUTPATIENT_CLINIC_OR_DEPARTMENT_OTHER): Payer: Self-pay

## 2023-11-10 DIAGNOSIS — M150 Primary generalized (osteo)arthritis: Secondary | ICD-10-CM | POA: Diagnosis not present

## 2023-11-10 DIAGNOSIS — I2510 Atherosclerotic heart disease of native coronary artery without angina pectoris: Secondary | ICD-10-CM | POA: Diagnosis not present

## 2023-11-10 DIAGNOSIS — C61 Malignant neoplasm of prostate: Secondary | ICD-10-CM | POA: Diagnosis not present

## 2023-11-10 DIAGNOSIS — G4733 Obstructive sleep apnea (adult) (pediatric): Secondary | ICD-10-CM | POA: Diagnosis not present

## 2023-11-10 DIAGNOSIS — I10 Essential (primary) hypertension: Secondary | ICD-10-CM | POA: Diagnosis not present

## 2023-11-10 DIAGNOSIS — G4730 Sleep apnea, unspecified: Secondary | ICD-10-CM | POA: Diagnosis not present

## 2023-11-10 DIAGNOSIS — I119 Hypertensive heart disease without heart failure: Secondary | ICD-10-CM | POA: Diagnosis not present

## 2023-11-10 DIAGNOSIS — E1169 Type 2 diabetes mellitus with other specified complication: Secondary | ICD-10-CM | POA: Diagnosis not present

## 2023-11-10 DIAGNOSIS — R001 Bradycardia, unspecified: Secondary | ICD-10-CM | POA: Diagnosis not present

## 2023-11-10 DIAGNOSIS — Z23 Encounter for immunization: Secondary | ICD-10-CM | POA: Diagnosis not present

## 2023-11-11 ENCOUNTER — Other Ambulatory Visit (HOSPITAL_COMMUNITY): Payer: Self-pay

## 2023-11-11 ENCOUNTER — Other Ambulatory Visit: Payer: Self-pay

## 2023-11-11 MED ORDER — LISINOPRIL 5 MG PO TABS
5.0000 mg | ORAL_TABLET | Freq: Every day | ORAL | 2 refills | Status: AC
  Filled 2023-11-11: qty 90, 90d supply, fill #0
  Filled 2024-02-02: qty 90, 90d supply, fill #1

## 2023-11-11 MED ORDER — PANTOPRAZOLE SODIUM 40 MG PO TBEC
40.0000 mg | DELAYED_RELEASE_TABLET | Freq: Every day | ORAL | 0 refills | Status: AC
  Filled 2023-11-11: qty 90, 90d supply, fill #0

## 2023-12-17 ENCOUNTER — Other Ambulatory Visit: Payer: Self-pay

## 2023-12-17 ENCOUNTER — Other Ambulatory Visit (HOSPITAL_BASED_OUTPATIENT_CLINIC_OR_DEPARTMENT_OTHER): Payer: Self-pay

## 2023-12-17 ENCOUNTER — Other Ambulatory Visit (HOSPITAL_COMMUNITY): Payer: Self-pay

## 2023-12-17 MED ORDER — METFORMIN HCL 500 MG PO TABS
500.0000 mg | ORAL_TABLET | Freq: Every morning | ORAL | 1 refills | Status: AC
  Filled 2023-12-17: qty 90, 90d supply, fill #0
  Filled 2024-03-10: qty 90, 90d supply, fill #1

## 2023-12-26 ENCOUNTER — Other Ambulatory Visit

## 2023-12-26 DIAGNOSIS — Z8546 Personal history of malignant neoplasm of prostate: Secondary | ICD-10-CM

## 2023-12-27 LAB — PSA: Prostate Specific Ag, Serum: 0.1 ng/mL (ref 0.0–4.0)

## 2023-12-29 ENCOUNTER — Ambulatory Visit: Payer: Self-pay

## 2023-12-30 NOTE — Telephone Encounter (Signed)
-----   Message from Belvie Clara sent at 12/30/2023  8:26 AM EST ----- Psa normal ----- Message ----- From: Sammie Exie HERO, CMA Sent: 12/29/2023   7:54 AM EST To: Belvie LITTIE Clara, MD  Please review  ----- Message ----- From: Rebecka Memos Lab Results In Sent: 12/27/2023   5:38 AM EST To: [facility] [place] [facility]

## 2023-12-30 NOTE — Telephone Encounter (Signed)
 Called pt to give PSA results per MD McKenzie pt voiced his understanding and confirmed next appt

## 2024-01-01 ENCOUNTER — Other Ambulatory Visit (HOSPITAL_COMMUNITY): Payer: Self-pay

## 2024-01-01 ENCOUNTER — Other Ambulatory Visit: Payer: Self-pay

## 2024-01-02 ENCOUNTER — Encounter: Payer: Self-pay | Admitting: Urology

## 2024-01-02 ENCOUNTER — Ambulatory Visit: Admitting: Urology

## 2024-01-02 VITALS — BP 114/65 | HR 60

## 2024-01-02 DIAGNOSIS — Z8546 Personal history of malignant neoplasm of prostate: Secondary | ICD-10-CM

## 2024-01-02 DIAGNOSIS — R351 Nocturia: Secondary | ICD-10-CM

## 2024-01-02 DIAGNOSIS — C61 Malignant neoplasm of prostate: Secondary | ICD-10-CM

## 2024-01-02 MED ORDER — TAMSULOSIN HCL 0.4 MG PO CAPS: 0.4000 mg | ORAL_CAPSULE | Freq: Every day | ORAL | 3 refills | Status: AC

## 2024-01-02 NOTE — Progress Notes (Signed)
 01/02/2024 9:55 AM   Larnell CHRISTELLA Vannie Raddle. 11/08/1945 [id number]  Referring provider: Leigh Lung, MD [address] [place],  [place] [address]  Followup prostate cancer   HPI: Mr Rossa is a 78yo here for followup for prostate cancer. PSA remains stable at 0.1. IPSS 8 QOL 2 on flomax  0.4mg  daily. Nocturia 2x. Urine stream strong. NO straining to urinate.   06/26/23: Rickey returns today in f/u for his history of T2b N0 M0 GG4 high risk prostate cancer treated with EXRT and ADT. His PSA is 0.1.  It was <0.1 previously.  He remains on tamsulosin  with an iPSS of 12 with nocturia 2-3x and some intermittency.  HE has had no hematuria and his UA is clear today.  He has no GI complaints.  He has lost about 30lb with Mounjaro .   He is looking into getting the left knee replaced.  He has no bone pain.     12/19/22: Carl returns today in f/u.  His PSA remains <0.1.  He is 2 years out from radiation therapy and about 1 year out from ADT.  He remains on tamsulosin  which is due for a refill.   He is voiding well with an IPSS of 7.  He has noct x 2 and some urgency.  He has no GI complaints on metamucil.  He has lost 22 lb on Mounjaro  which he is on to try to get his weight down for a left knee replacement.  He has no bone pain.      06/13/22: Grier returns today in f/u for his history below.  He has completed his course of adjuvant ADT in 11/23 and completed radiation in 9/22.  His T is up to 384 and the PSA remains <0.1.  He has fatigue. He has arthralgias but no bone pain or involuntary weight loss.  His IPSS is 9 with nocturia x 2.  He remains on tamsulosin .  His UA is clear.    PMH: Past Medical History:  Diagnosis Date   Arthritis    all over oa   CAD (coronary artery disease)    Cancer (HCC)    Prostate   Colon polyps 01/23/2010   Descending   Diverticulosis    dm type 2    Eczema 09/05/2020   Gastric ulcer 2010   GI bleed 2010   Hyperlipidemia    Hypertension    Hypotension    transient    Neuropathy 09/05/2020   both hands and feet   OSA (obstructive sleep apnea)    Severe, on CPAP therapy- no longer uses cpap per pt   Prostate cancer [facility])    Wears glasses 09/05/2020    Surgical History: Past Surgical History:  Procedure Laterality Date   CARDIAC CATHETERIZATION  05/01/2007   Recommend rotational atherectomy followed by PTCA and probable stenting of LAD.   CARDIAC CATHETERIZATION  05/28/2007   Mid LAD hihgh grade 80-90% stenosis, stented with a 3x90mm Cypher stent implanted at 16atm, postdilated with a 3.25x26mm Quantum at 16atm, which revealed excellent wall appoistion - 5.41mm   CARDIOVASCULAR STRESS TEST  02/05/2011   Perfusion defect seen in inferior myocardial region consistent with diagphragmatic attenuation. Remaining myocardium demonstrates normal myocardial perfusion with no evidence of ischemia or infarct. ECG is positive for ischemia.   colonscopy  2012   CPAP/BIPAP SLEEP STUDY  07/01/2007   AHI-0.5/hr at 11cm water  pressure, RDI-12.6/hr   CYSTOSCOPY  09/08/2020   Procedure: CYSTOSCOPY FLEXIBLE;  Surgeon: Watt Rush,  MD;  Location: [facility];  Service: Urology;;  NO SEEDS FOUND IN BLADDER   LOWER ARTERIAL DOPPLER  06/03/2007   No evidence of dissection, AV fistula, or active pseudoaneurysm.   NASAL ENDOSCOPY  12/2008   RADIOACTIVE SEED IMPLANT N/A 09/08/2020   Procedure: RADIOACTIVE SEED IMPLANT/BRACHYTHERAPY IMPLANT;  Surgeon: Watt Rush, MD;  Location: [facility];  Service: Urology;  Laterality: N/A;  51 SEEDS IMPLANTED   SLEEP STUDY  05/13/2007   AHI-11.52/hr, AHI REM-56.0/hr, RDI-11.7/hr, RDI REM-56.0/hr, avg oxygen sat-94%   SPACE OAR INSTILLATION N/A 09/08/2020   Procedure: SPACE OAR INSTILLATION;  Surgeon: Watt Rush, MD;  Location: [facility];  Service: Urology;  Laterality: N/A;   stent to heart x 1  2010   TOTAL KNEE ARTHROPLASTY Left 08/18/2023   Procedure: ARTHROPLASTY, KNEE, TOTAL;  Surgeon:  Yvone Rush, MD;  Location: [facility];  Service: Orthopedics;  Laterality: Left;   TRANSTHORACIC ECHOCARDIOGRAM  03/13/2010   EF >55%, normal diastolic function, mild MR, mild TR, and normal RVSP    Home Medications:  Allergies as of 01/02/2024       Reactions   Codeine Itching, Rash   Statins Other (See Comments)   Muscle aches, tolerates simvastatin          Medication List        Accurate as of January 02, 2024  9:55 AM. If you have any questions, ask your nurse or doctor.          Accu-Chek Guide test strip Generic drug: glucose blood TEST AS DIRECTED ONCEODAILY.M   glucose blood test strip Test blood sugar 2 (two) times daily.   OneTouch Verio test strip Generic drug: glucose blood Use daily as directed   acetaminophen  650 MG CR tablet Commonly known as: TYLENOL  Take 650-1,300 mg by mouth every 8 (eight) hours as needed. Pt takes 2 tablets in the morning and 2 tablets in the evening   aspirin  EC 325 MG tablet Take 1 tablet (325 mg total) by mouth 2 (two) times daily with food.   CALCIUM  600 + D PO Take 2 tablets by mouth every evening.   carvedilol  3.125 MG tablet Commonly known as: COREG  Take 1 tablet (3.125 mg total) by mouth 2 (two) times daily with a meal.   CINNAMON PO Take 1 tablet by mouth 2 (two) times daily.   clotrimazole -betamethasone  cream Commonly known as: LOTRISONE  Apply 1 Application topically 2 (two) times daily.   CoQ10 100 MG Caps Take 100 mg by mouth daily.   docusate sodium  100 MG capsule Commonly known as: COLACE Take 1 capsule (100 mg total) by mouth 2 (two) times daily to prevent constipation.   ezetimibe  10 MG tablet Commonly known as: ZETIA  Take 1 tablet (10 mg total) by mouth daily.   Geritol Liqd Take 1 Dose by mouth every evening.   HYDROmorphone  2 MG tablet Commonly known as: Dilaudid  Take 1 tablet (2 mg total) by mouth every 4 (four) hours as needed for severe pain (pain score 7-10).   ketoconazole 2 %  cream Commonly known as: NIZORAL Apply 1 application  topically daily as needed (eczema).   lisinopril  5 MG tablet Commonly known as: ZESTRIL  Take 1 tablet (5 mg total) by mouth daily.   lisinopril  5 MG tablet Commonly known as: ZESTRIL  Take 1 tablet (5 mg total) by mouth daily.   meclizine  25 MG tablet Commonly known as: ANTIVERT  Take 25 mg by mouth daily.   METAMUCIL PO Take 1  Dose by mouth every evening. 1 dose = 1 heaping tablespoon   metFORMIN  500 MG tablet Commonly known as: GLUCOPHAGE  Take 500 mg by mouth daily with breakfast.   metFORMIN  500 MG tablet Commonly known as: GLUCOPHAGE  Take 1 tablet (500 mg total) by mouth every morning for diabetes.   Mounjaro  5 MG/0.5ML Pen Generic drug: tirzepatide  Inject 5 mg into the skin every 7 (seven) days.   Mounjaro  7.5 MG/0.5ML Pen Generic drug: tirzepatide  Inject 7.5 mg into the skin once a week.   Mounjaro  7.5 MG/0.5ML Pen Generic drug: tirzepatide  Inject 7.5 mg into the skin every 7 (seven) days.   OVER THE COUNTER MEDICATION Take 2 capsules by mouth daily. Hemp Gummies   pantoprazole  40 MG tablet Commonly known as: PROTONIX  Take 1 tablet (40 mg total) by mouth daily.   pantoprazole  40 MG tablet Commonly known as: PROTONIX  Take 1 tablet (40 mg total) by mouth daily.   polyethylene glycol powder 17 GM/SCOOP powder Commonly known as: GLYCOLAX /MIRALAX  Take 17 g by mouth every evening.   Prevagen Extra Strength 20 MG Caps Generic drug: Apoaequorin Take 20 mg by mouth every evening.   simvastatin  40 MG tablet Commonly known as: ZOCOR  Take 1 tablet (40 mg total) by mouth at bedtime.   tamsulosin  0.4 MG Caps capsule Commonly known as: Flomax  Take 1 capsule (0.4 mg total) by mouth daily after supper.   tiZANidine  2 MG tablet Commonly known as: ZANAFLEX  Take 1 tablet (2 mg total) by mouth every 6 (six) hours as needed.   TRUEplus Lancets 33G Misc Test blood sugar 2 (two) times daily.   OneTouch Delica  Plus Lancet33G Misc Use 2 times daily as directed   Turmeric 500 MG Caps Take 1,000 mg by mouth daily.   vitamin C 1000 MG tablet Take 1,000 mg by mouth 2 (two) times daily.   Vitamin D3 Maximum Strength 125 MCG (5000 UT) capsule Generic drug: Cholecalciferol Take 10,000 Units by mouth daily.        Allergies:  Allergies  Allergen Reactions   Codeine Itching and Rash   Statins Other (See Comments)    Muscle aches, tolerates simvastatin       Family History: Family History  Problem Relation Age of Onset   Stroke Brother    Hypertension Brother    Alzheimer's disease Maternal Grandmother    Stroke Maternal Grandfather    Heart disease Maternal Grandfather    Stroke Paternal Grandmother    Heart attack Paternal Grandfather    Hypertension Father    Alzheimer's disease Father    Parkinson's disease Father    Alzheimer's disease Mother    Pancreatic cancer Maternal Uncle    Breast cancer Neg Hx    Prostate cancer Neg Hx    Colon cancer Neg Hx     Social History:  reports that he quit smoking about 45 years ago. His smoking use included cigarettes. He started smoking about 61 years ago. He has a 8 pack-year smoking history. He has never used smokeless tobacco. He reports that he does not drink alcohol and does not use drugs.  ROS: All other review of systems were reviewed and are negative except what is noted above in HPI  Physical Exam: BP 114/65   Pulse 60   Constitutional:  Alert and oriented, No acute distress. HEENT: [place] AT, moist mucus membranes.  Trachea midline, no masses. Cardiovascular: No clubbing, cyanosis, or edema. Respiratory: Normal respiratory effort, no increased work of breathing. GI: Abdomen is soft, nontender,  nondistended, no abdominal masses GU: No CVA tenderness.  Lymph: No cervical or inguinal lymphadenopathy. Skin: No rashes, bruises or suspicious lesions. Neurologic: Grossly intact, no focal deficits, moving all 4  extremities. Psychiatric: Normal mood and affect.  Laboratory Data: Lab Results  Component Value Date   WBC 6.0 08/05/2023   HGB 14.0 08/05/2023   HCT 42.0 08/05/2023   MCV 92.7 08/05/2023   PLT 231 08/05/2023    Lab Results  Component Value Date   CREATININE 0.76 08/05/2023    No results found for: PSA  Lab Results  Component Value Date   TESTOSTERONE  384 06/06/2022    Lab Results  Component Value Date   HGBA1C 5.6 08/05/2023    Urinalysis    Component Value Date/Time   COLORURINE YELLOW 01/24/2009 0201   APPEARANCEUR Clear 06/26/2023 1119   LABSPEC 1.020 01/24/2009 0201   PHURINE 6.0 01/24/2009 0201   GLUCOSEU Negative 06/26/2023 1119   HGBUR NEGATIVE 01/24/2009 0201   BILIRUBINUR Negative 06/26/2023 1119   KETONESUR NEGATIVE 01/24/2009 0201   PROTEINUR Negative 06/26/2023 1119   PROTEINUR NEGATIVE 01/24/2009 0201   UROBILINOGEN 0.2 01/24/2009 0201   NITRITE Negative 06/26/2023 1119   NITRITE NEGATIVE 01/24/2009 0201   LEUKOCYTESUR Negative 06/26/2023 1119    Lab Results  Component Value Date   LABMICR Comment 06/26/2023   WBCUA None seen 03/08/2021   LABEPIT None seen 03/08/2021   MUCUS Present 01/25/2021   BACTERIA None seen 03/08/2021    Pertinent Imaging: *** No results found for this or any previous visit.  No results found for this or any previous visit.  No results found for this or any previous visit.  No results found for this or any previous visit.  No results found for this or any previous visit.  No results found for this or any previous visit.  Results for orders placed during the hospital encounter of 02/12/21  CT HEMATURIA WORKUP  Narrative CLINICAL DATA:  Intermittent hematuria, history of prostate cancer  EXAM: CT ABDOMEN AND PELVIS WITHOUT AND WITH CONTRAST  TECHNIQUE: Multidetector CT imaging of the abdomen and pelvis was performed following the standard protocol before and following the bolus administration  of intravenous contrast.  CONTRAST:  OMNIPAQUE  IOHEXOL  350 MG/ML SOLN  COMPARISON:  06/01/2020  FINDINGS: Lower chest: No acute abnormality. Three-vessel coronary artery calcifications and stents. Small hiatal hernia.  Hepatobiliary: No solid liver abnormality is seen. No gallstones, gallbladder wall thickening, or biliary dilatation.  Pancreas: Unremarkable. No pancreatic ductal dilatation or surrounding inflammatory changes.  Spleen: Normal in size without significant abnormality.  Adrenals/Urinary Tract: Adrenal glands are unremarkable. Multiple small bilateral nonobstructive renal calculi. No ureteral calculi or hydronephrosis. Thickening of the decompressed urinary bladder.  Stomach/Bowel: Stomach is within normal limits. Appendix is not clearly visualized. No evidence of bowel wall thickening, distention, or inflammatory changes. Descending and sigmoid diverticulosis.  Vascular/Lymphatic: Aortic atherosclerosis. No enlarged abdominal or pelvic lymph nodes.  Reproductive: Prostate brachytherapy pellets.  Other: Small umbilical hernia.  No abdominopelvic ascites.  Musculoskeletal: No acute or significant osseous findings.  IMPRESSION: 1. Multiple small bilateral nonobstructive renal calculi. No ureteral calculi or hydronephrosis. 2. No mass, suspicious contrast enhancement, or urinary tract filling defect on delayed phase imaging. 3. Thickening of the decompressed urinary bladder, likely related to chronic outlet obstruction. 4. Prostate brachytherapy pellets. 5. Coronary artery disease.  Aortic Atherosclerosis (ICD10-I70.0).   Electronically Signed By: Marolyn JONETTA Jaksch M.D. On: 02/12/2021 14:06  No results found for this or any previous  visit.   Assessment & Plan:    1.  Malignant neoplasm of prostate (HCC) Followup 6 months with PSA  2. Nocturia Flomax  0.4mg  daily   No follow-ups on file.  Belvie Clara, MD  [facility]  [place]

## 2024-01-02 NOTE — Patient Instructions (Signed)

## 2024-01-05 ENCOUNTER — Other Ambulatory Visit (HOSPITAL_COMMUNITY): Payer: Self-pay

## 2024-01-05 ENCOUNTER — Other Ambulatory Visit (HOSPITAL_BASED_OUTPATIENT_CLINIC_OR_DEPARTMENT_OTHER): Payer: Self-pay

## 2024-01-07 ENCOUNTER — Other Ambulatory Visit (HOSPITAL_COMMUNITY): Payer: Self-pay

## 2024-01-09 ENCOUNTER — Other Ambulatory Visit: Payer: Self-pay

## 2024-01-09 ENCOUNTER — Other Ambulatory Visit (HOSPITAL_COMMUNITY): Payer: Self-pay

## 2024-01-14 ENCOUNTER — Other Ambulatory Visit (HOSPITAL_COMMUNITY): Payer: Self-pay

## 2024-01-19 ENCOUNTER — Other Ambulatory Visit: Payer: Self-pay

## 2024-01-19 ENCOUNTER — Other Ambulatory Visit (HOSPITAL_COMMUNITY): Payer: Self-pay

## 2024-01-27 ENCOUNTER — Encounter: Payer: Self-pay | Admitting: Podiatry

## 2024-01-27 ENCOUNTER — Ambulatory Visit: Admitting: Podiatry

## 2024-01-27 DIAGNOSIS — E1149 Type 2 diabetes mellitus with other diabetic neurological complication: Secondary | ICD-10-CM

## 2024-01-27 DIAGNOSIS — B351 Tinea unguium: Secondary | ICD-10-CM

## 2024-01-27 DIAGNOSIS — M79674 Pain in right toe(s): Secondary | ICD-10-CM | POA: Diagnosis not present

## 2024-01-27 DIAGNOSIS — M79675 Pain in left toe(s): Secondary | ICD-10-CM | POA: Diagnosis not present

## 2024-02-01 NOTE — Progress Notes (Signed)
  Subjective:  Patient ID: Jordan CHRISTELLA Vannie Mickey., male    DOB: August 22, 1945,  MRN: [id number]  Jordan CHRISTELLA Vannie Mickey. presents to clinic today for at risk foot care with history of diabetic neuropathy and painful mycotic toenails x 10 which interfere with daily activities. Pain is relieved with periodic professional debridement.  Chief Complaint  Patient presents with   Nail Problem    Thick painful toenails, 6 month follow up    New problem(s): None.   PCP is Jordan Lung, MD. [name] 11/10/23.  Allergies  Allergen Reactions   Codeine Itching and Rash   Statins Other (See Comments)    Muscle aches, tolerates simvastatin       Review of Systems: Negative except as noted in the HPI.  Objective: No changes noted in today's physical examination. There were no vitals filed for this visit. Jordan Jr Milliron. is a pleasant 78 y.o. male obese in NAD. AAO x 3.  Vascular Examination: Capillary refill time immediate b/l. Vascular status intact b/l with palpable pedal pulses. Pedal hair present b/l. No pain with calf compression b/l. Skin temperature gradient WNL b/l. No cyanosis or clubbing b/l. No ischemia or gangrene noted b/l.   Neurological Examination: Sensation grossly intact b/l with 10 gram monofilament. Vibratory sensation intact b/l.   Dermatological Examination: Pedal skin with normal turgor, texture and tone b/l.  No open wounds. No interdigital macerations.   Toenails 1-5 b/l thick, discolored, elongated with subungual debris and pain on dorsal palpation.   Minimal hyperkeratotic lesion(s) distal tip of left 2nd toe and distal tip of right 2nd toe.  No erythema, no edema, no drainage, no fluctuance.  Musculoskeletal Examination: Muscle strength 5/5 to all lower extremity muscle groups bilaterally. No pain, crepitus or joint limitation noted with ROM bilateral LE. Hammertoe deformity noted 2-5 b/l.  Radiographs: None  Assessment/Plan: 1. Pain due to onychomycosis of toenails of both feet    2. Type II diabetes mellitus with neurological manifestations Jordan [name])   Patient was evaluated and treated. All patient's and/or POA's questions/concerns addressed on today's visit. Toenails 1-5 b/l debrided in length and girth without incident. Continue foot and shoe inspections daily. Monitor blood glucose per PCP/Endocrinologist's recommendations. Continue soft, supportive shoe gear daily. Report any pedal injuries to medical professional. Call office if there are any questions/concerns. -As a courtesy, corn(s)  distal tip of left 2nd toe and distal tip of right 2nd toe gently filed without complication or incident. Total number pared=2. -Patient/POA to call should there be question/concern in the interim.   Return in about 3 months (around 04/26/2024).  Jordan [name], DPM      [place] LOCATION: [address] [place] [address]                   Office [phone]   [facility] LOCATION: [address] [place], [place] [address] Office [phone]

## 2024-02-02 ENCOUNTER — Other Ambulatory Visit: Payer: Self-pay | Admitting: Physician Assistant

## 2024-02-02 ENCOUNTER — Other Ambulatory Visit (HOSPITAL_COMMUNITY): Payer: Self-pay

## 2024-02-06 ENCOUNTER — Other Ambulatory Visit: Payer: Self-pay | Admitting: Physician Assistant

## 2024-02-06 ENCOUNTER — Other Ambulatory Visit (HOSPITAL_BASED_OUTPATIENT_CLINIC_OR_DEPARTMENT_OTHER): Payer: Self-pay

## 2024-02-18 ENCOUNTER — Encounter (HOSPITAL_BASED_OUTPATIENT_CLINIC_OR_DEPARTMENT_OTHER): Payer: Self-pay

## 2024-02-18 ENCOUNTER — Other Ambulatory Visit (HOSPITAL_BASED_OUTPATIENT_CLINIC_OR_DEPARTMENT_OTHER): Payer: Self-pay

## 2024-03-01 ENCOUNTER — Other Ambulatory Visit (HOSPITAL_COMMUNITY): Payer: Self-pay

## 2024-03-01 ENCOUNTER — Encounter: Payer: Self-pay | Admitting: *Deleted

## 2024-03-01 NOTE — Progress Notes (Signed)
 Jordan [name] Jordan [name].                                          MRN: [id number]   03/01/2024   The [facility] Quality Team Specialist reviewed this patient medical record for the purposes of chart review for care gap closure. The following were reviewed: chart review for care gap closure-kidney health evaluation for diabetes:eGFR  and uACR.    [facility] Quality Team

## 2024-03-10 ENCOUNTER — Other Ambulatory Visit (HOSPITAL_COMMUNITY): Payer: Self-pay

## 2024-03-11 ENCOUNTER — Other Ambulatory Visit (HOSPITAL_COMMUNITY): Payer: Self-pay

## 2024-03-28 ENCOUNTER — Other Ambulatory Visit (HOSPITAL_COMMUNITY): Payer: Self-pay

## 2024-03-28 ENCOUNTER — Other Ambulatory Visit: Payer: Self-pay

## 2024-03-28 MED ORDER — ACCU-CHEK GUIDE ME W/DEVICE KIT
PACK | 0 refills | Status: AC
  Filled 2024-03-28: qty 1, 1d supply, fill #0

## 2024-03-28 MED ORDER — ACCU-CHEK GUIDE TEST VI STRP
1.0000 | ORAL_STRIP | Freq: Two times a day (BID) | 5 refills | Status: AC
  Filled 2024-03-28: qty 150, 75d supply, fill #0

## 2024-03-30 ENCOUNTER — Ambulatory Visit: Admitting: Gastroenterology

## 2024-05-12 ENCOUNTER — Ambulatory Visit: Admitting: Podiatry

## 2024-06-25 ENCOUNTER — Other Ambulatory Visit

## 2024-07-05 ENCOUNTER — Ambulatory Visit: Admitting: Urology
